# Patient Record
Sex: Female | Born: 1965 | Race: White | Hispanic: No | Marital: Single | Smoking: Never smoker
Health system: Western US, Academic
[De-identification: ages and names within clinical notes are randomized; demographics above are authoritative.]

## PROBLEM LIST (undated history)

## (undated) DIAGNOSIS — M549 Dorsalgia, unspecified: Secondary | ICD-10-CM

## (undated) DIAGNOSIS — I10 Essential (primary) hypertension: Secondary | ICD-10-CM

## (undated) DIAGNOSIS — E785 Hyperlipidemia, unspecified: Secondary | ICD-10-CM

## (undated) DIAGNOSIS — E119 Type 2 diabetes mellitus without complications: Secondary | ICD-10-CM

## (undated) DIAGNOSIS — K5190 Ulcerative colitis, unspecified, without complications: Secondary | ICD-10-CM

## (undated) HISTORY — DX: Dorsalgia, unspecified: M54.9

## (undated) HISTORY — DX: Essential (primary) hypertension: I10

## (undated) HISTORY — DX: Type 2 diabetes mellitus without complications (CMS-HCC): E11.9

## (undated) HISTORY — DX: Hyperlipidemia, unspecified: E78.5

## (undated) MED ORDER — OXYCODONE-ACETAMINOPHEN 5-325 MG OR TABS
1.00 | ORAL_TABLET | Freq: Three times a day (TID) | ORAL | Status: AC | PRN
Start: 2011-06-03 — End: ?

## (undated) MED ORDER — MORPHINE SULFATE 20 MG OR CP24
20.00 mg | ORAL_CAPSULE | Freq: Every day | ORAL | Status: AC
Start: 2012-10-10 — End: ?

## (undated) MED ORDER — HYDROCODONE-ACETAMINOPHEN 7.5-325 MG OR TABS
1.00 | ORAL_TABLET | Freq: Four times a day (QID) | ORAL | Status: AC | PRN
Start: 2011-05-21 — End: ?

## (undated) MED ORDER — HYDROCODONE-ACETAMINOPHEN 7.5-325 MG OR TABS
1.00 | ORAL_TABLET | Freq: Three times a day (TID) | ORAL | Status: AC | PRN
Start: 2012-02-11 — End: 2012-03-15

## (undated) MED ORDER — TRAMADOL HCL 50 MG OR TABS
50.00 mg | ORAL_TABLET | Freq: Four times a day (QID) | ORAL | Status: AC | PRN
Start: 2009-10-17 — End: ?

## (undated) MED ORDER — LANSOPRAZOLE 30 MG OR CPDR
30.00 mg | DELAYED_RELEASE_CAPSULE | Freq: Every day | ORAL | Status: AC
Start: 2011-04-12 — End: ?

## (undated) MED ORDER — HEPARIN SODIUM (PORCINE) 10000 UNIT/ML IJ SOLN
5000.00 [IU] | Freq: Three times a day (TID) | INTRAMUSCULAR | Status: AC
Start: 2011-04-08 — End: ?

## (undated) MED ORDER — HYDROCODONE-ACETAMINOPHEN 7.5-325 MG OR TABS
1.00 | ORAL_TABLET | Freq: Three times a day (TID) | ORAL | Status: AC | PRN
Start: 2012-04-14 — End: 2012-07-13

## (undated) MED ORDER — TRAMADOL HCL 50 MG OR TABS
50.00 mg | ORAL_TABLET | ORAL | Status: AC
Start: 2012-02-11 — End: ?

## (undated) MED ORDER — ONDANSETRON HCL 4 MG OR TABS
4.00 mg | ORAL_TABLET | Freq: Three times a day (TID) | ORAL | Status: AC | PRN
Start: 2012-01-07 — End: ?

## (undated) MED ORDER — LORAZEPAM 1 MG OR TABS
1.00 mg | ORAL_TABLET | Freq: Three times a day (TID) | ORAL | Status: AC | PRN
Start: 2012-04-15 — End: ?

## (undated) MED ORDER — LORAZEPAM 1 MG OR TABS
1.00 mg | ORAL_TABLET | Freq: Three times a day (TID) | ORAL | Status: AC | PRN
Start: 2012-04-22 — End: ?

## (undated) MED ORDER — LORAZEPAM 1 MG OR TABS
1.00 mg | ORAL_TABLET | Freq: Three times a day (TID) | ORAL | Status: AC | PRN
Start: 2012-05-25 — End: ?

## (undated) MED ORDER — DIAZEPAM 5 MG OR TABS
5.00 mg | ORAL_TABLET | Freq: Three times a day (TID) | ORAL | Status: AC | PRN
Start: 2009-11-21 — End: ?

## (undated) MED ORDER — GLIPIZIDE 5 MG OR TB24
5.00 mg | ORAL_TABLET | Freq: Every day | ORAL | Status: AC
Start: 2011-10-10 — End: ?

## (undated) MED ORDER — PIOGLITAZONE HCL 15 MG OR TABS
15.00 mg | ORAL_TABLET | Freq: Every day | ORAL | Status: AC
Start: 2010-06-26 — End: ?

## (undated) MED ORDER — HYDROCODONE-ACETAMINOPHEN 7.5-325 MG OR TABS
1.00 | ORAL_TABLET | Freq: Three times a day (TID) | ORAL | Status: AC | PRN
Start: 2012-03-15 — End: 2012-04-17

## (undated) MED ORDER — LORAZEPAM 1 MG OR TABS
1.00 mg | ORAL_TABLET | Freq: Three times a day (TID) | ORAL | Status: AC | PRN
Start: 2011-12-29 — End: ?

## (undated) MED ORDER — TRAMADOL HCL 50 MG OR TABS
50.00 mg | ORAL_TABLET | Freq: Four times a day (QID) | ORAL | Status: AC | PRN
Start: 2009-10-11 — End: ?

## (undated) MED ORDER — HYDROCODONE-ACETAMINOPHEN 7.5-325 MG OR TABS
1.00 | ORAL_TABLET | Freq: Four times a day (QID) | ORAL | Status: AC | PRN
Start: 2011-05-19 — End: ?

## (undated) MED ORDER — TRAMADOL HCL 50 MG OR TABS
50.00 mg | ORAL_TABLET | ORAL | Status: AC
Start: 2012-04-22 — End: 2012-05-12

## (undated) MED ORDER — PROCHLORPERAZINE MALEATE 10 MG OR TABS
10.00 mg | ORAL_TABLET | Freq: Four times a day (QID) | ORAL | 0 refills | Status: AC | PRN
Start: 2015-01-22 — End: ?

## (undated) MED ORDER — LORAZEPAM 1 MG OR TABS
1.00 mg | ORAL_TABLET | Freq: Three times a day (TID) | ORAL | Status: AC | PRN
Start: 2012-01-14 — End: ?

## (undated) MED ORDER — CARISOPRODOL 350 MG OR TABS
350.00 mg | ORAL_TABLET | ORAL | Status: AC
Start: 2010-08-19 — End: ?

## (undated) MED ORDER — PROMETHAZINE HCL 12.5 MG OR TABS
12.50 mg | ORAL_TABLET | Freq: Two times a day (BID) | ORAL | Status: AC | PRN
Start: 2010-12-26 — End: ?

## (undated) MED ORDER — TRAMADOL HCL 50 MG OR TABS
100.00 mg | ORAL_TABLET | Freq: Three times a day (TID) | ORAL | Status: AC | PRN
Start: 2011-06-12 — End: 2011-07-12

## (undated) MED ORDER — ENOXAPARIN SODIUM 40 MG/0.4ML SC SOLN
40.00 mg | Freq: Every day | SUBCUTANEOUS | Status: AC
Start: 2015-01-03 — End: ?

## (undated) MED ORDER — TRAMADOL HCL 50 MG OR TABS
50.00 mg | ORAL_TABLET | Freq: Three times a day (TID) | ORAL | Status: AC | PRN
Start: 2010-12-26 — End: 2011-01-25

## (undated) MED ORDER — HYDROCODONE-ACETAMINOPHEN 7.5-325 MG OR TABS
1.00 | ORAL_TABLET | Freq: Three times a day (TID) | ORAL | Status: AC | PRN
Start: 2012-05-13 — End: 2012-08-11

## (undated) MED ORDER — HYDROCODONE-ACETAMINOPHEN 7.5-325 MG OR TABS
1.00 | ORAL_TABLET | Freq: Three times a day (TID) | ORAL | Status: AC | PRN
Start: 2012-03-11 — End: 2012-04-13

## (undated) MED ORDER — HYDROMORPHONE HCL 4 MG OR TABS
4.00 mg | ORAL_TABLET | ORAL | 0 refills | Status: AC | PRN
Start: 2015-01-22 — End: ?

## (undated) MED ORDER — LORAZEPAM 1 MG OR TABS
1.00 mg | ORAL_TABLET | Freq: Three times a day (TID) | ORAL | Status: AC | PRN
Start: 2012-03-11 — End: ?

## (undated) MED ORDER — CARISOPRODOL 350 MG OR TABS
350.00 mg | ORAL_TABLET | Freq: Three times a day (TID) | ORAL | Status: AC | PRN
Start: 2009-09-14 — End: ?

## (undated) MED ORDER — TRAMADOL HCL 50 MG OR TABS
50.00 mg | ORAL_TABLET | ORAL | Status: AC
Start: 2012-04-23 — End: 2012-05-13

## (undated) MED ORDER — TRAMADOL HCL 50 MG OR TABS
50.00 mg | ORAL_TABLET | ORAL | Status: AC
Start: 2012-01-07 — End: ?

## (undated) MED ORDER — HYDROCODONE-ACETAMINOPHEN 7.5-750 MG OR TABS
1.00 | ORAL_TABLET | Freq: Four times a day (QID) | ORAL | Status: AC | PRN
Start: 2011-05-12 — End: ?

---

## 1992-08-11 HISTORY — PX: PB ANESTH,TUBAL LIGATION/TRANSECTION: 00851

## 2009-05-18 ENCOUNTER — Inpatient Hospital Stay
Admission: EM | Admit: 2009-05-18 | Discharge: 2009-05-22 | Disposition: A | Discharge status: Home Health | Payer: Self-pay | Attending: Cardiology | Admitting: Cardiology

## 2009-05-18 ENCOUNTER — Ambulatory Visit (HOSPITAL_BASED_OUTPATIENT_CLINIC_OR_DEPARTMENT_OTHER): Admitting: Ophthalmology

## 2009-05-18 ENCOUNTER — Other Ambulatory Visit (HOSPITAL_BASED_OUTPATIENT_CLINIC_OR_DEPARTMENT_OTHER): Payer: Self-pay | Admitting: Emergency Medicine

## 2009-05-18 LAB — APTT, BLOOD: PTT: 31.9 s (ref 25.0–34.0)

## 2009-05-18 LAB — BASIC METABOLIC PANEL, BLOOD
BUN: 13 mg/dL (ref 6–20)
Bicarbonate: 23 mmol/L (ref 22–29)
Calcium: 9.8 mg/dL (ref 8.6–10.5)
Chloride: 100 mmol (ref 98–107)
Creatinine: 0.46 mg/dL — ABNORMAL LOW (ref 0.51–0.95)
GFR (African Amer.): >60 mL/min
GFR: >60 mL/min
Glucose: 146 mg/dL — ABNORMAL HIGH (ref ?–115)
Potassium: 4.6 mmol/L (ref 3.5–5.1)
Sodium: 136 mmol/L (ref 136–145)

## 2009-05-18 LAB — URINALYSIS
Bilirubin: NEGATIVE
Blood: NEGATIVE
Glucose: NEGATIVE
Ketones: NEGATIVE
Leuk Esterase: NEGATIVE
Nitrite: NEGATIVE
Specific Gravity: 1.012 (ref 1.002–1.030)
pH: 6 (ref 5.0–8.0)

## 2009-05-18 LAB — CBC WITH DIFF, BLOOD
Basophils: 0 % (ref 0–2)
Eosinophils: 3 % (ref 1–3)
Hct: 39.2 % (ref 36.0–46.0)
Hgb: 13.1 gm/dL (ref 12.0–16.0)
Lymphocytes: 33 % (ref 20–40)
MCH: 30.4 pg (ref 27–31)
MCHC: 33.3 % (ref 32–37)
MCV: 91.2 um3 (ref 82.0–98.0)
MPV: 8.4 fL (ref 7.4–10.4)
Monocytes: 4 % (ref 1–10)
Plt Count: 243 10*3/uL (ref 130–400)
RBC: 4.3 10*6/uL (ref 4.00–5.00)
RDW: 14 % (ref 10–15)
Segs: 60 % (ref 45–70)
WBC: 7.4 10*3/uL (ref 4.0–11.0)

## 2009-05-18 LAB — UR DRUGS OF ABUSE SCREEN
Amphetamines Screen: NEGATIVE
Barbiturates Screen: NEGATIVE
Benzodiazepine Screen: POSITIVE
Cocaine Screen: NEGATIVE
Methadone Screen: NEGATIVE
Opiates Screen: NEGATIVE
Oxycodone Screen: NEGATIVE
Phencyclidine Screen: NEGATIVE
Propoxyphen: NEGATIVE
THC Screen: NEGATIVE

## 2009-05-18 LAB — CKMB+INDEX (NO CPK), BLOOD
CK-MB Index: INVALID % (ref <2.5)
CK-MB Index: INVALID % (ref <2.5)
CK-MB: 1.1 ng/mL
CK-MB: 1 ng/mL

## 2009-05-18 LAB — RENIN, BLOOD: Renin: INVALID

## 2009-05-18 LAB — LIPASE, BLOOD: Lipase: 40 U/L (ref ?–60)

## 2009-05-18 LAB — GLYCOSYLATED HGB(A1C), BLOOD: Glyco Hgb (A1C): 6.7 % — ABNORMAL HIGH (ref ?–5.9)

## 2009-05-18 LAB — LIVER PANEL, BLOOD
ALT (SGPT): 25 U/L
AST (SGOT): 26 U/L
Albumin: 4.6 gm/dL (ref 3.5–5.2)
Alkaline Phos: 73 U/L
Bilirubin, Dir: 0.1 mg/dL (ref <0.2)
Bilirubin, Tot: 0.3 mg/dL (ref <1.2)
Total Protein: 8 gm/dL (ref ?–8.0)

## 2009-05-18 LAB — CPK-CREATINE PHOSPHOKINASE, BLOOD
CPK: 53 IU/L (ref 0–175)
CPK: 45 [IU]/L (ref 0–175)

## 2009-05-18 LAB — ALDOSTERONE, BLOOD: Aldosterone, Blood: INVALID

## 2009-05-18 LAB — TSH, BLOOD: TSH: 0.72 u[IU]/mL (ref 0.27–4.20)

## 2009-05-18 LAB — TROPONIN T, BLOOD
Troponin T: <0.01 ng/mL (ref <0.03)
Troponin T: <0.01 ng/mL (ref <0.03)

## 2009-05-18 LAB — PROTHROMBIN TIME, BLOOD
INR: 1
PT,Patient: 11.6 s (ref 9.7–12.5)

## 2009-05-18 MED ORDER — CARISOPRODOL 350 MG OR TABS
350.00 mg | ORAL_TABLET | Freq: Three times a day (TID) | ORAL | Status: DC | PRN
Start: ? — End: 2009-09-17

## 2009-05-18 MED ORDER — DIAZEPAM 5 MG OR TABS
5.00 mg | ORAL_TABLET | Freq: Three times a day (TID) | ORAL | Status: DC | PRN
Start: ? — End: 2009-08-23

## 2009-05-18 MED ORDER — DIAZEPAM 5 MG OR TABS
5.0000 mg | ORAL_TABLET | Freq: Three times a day (TID) | ORAL | Status: DC | PRN
Start: 2009-05-18 — End: 2009-05-22

## 2009-05-18 MED ORDER — TRAMADOL HCL 50 MG OR TABS
50.00 mg | ORAL_TABLET | Freq: Four times a day (QID) | ORAL | Status: DC | PRN
Start: ? — End: 2009-08-23

## 2009-05-18 MED ORDER — METFORMIN HCL 500 MG OR TABS
500.00 mg | ORAL_TABLET | Freq: Two times a day (BID) | ORAL | Status: DC
Start: ? — End: 2009-08-23

## 2009-05-18 MED ORDER — TRAMADOL HCL 50 MG OR TABS
50.0000 mg | ORAL_TABLET | Freq: Four times a day (QID) | ORAL | Status: DC | PRN
Start: 2009-05-18 — End: 2009-05-22

## 2009-05-18 MED ORDER — CLONIDINE HCL 0.2 MG OR TABS
0.20 mg | ORAL_TABLET | Freq: Three times a day (TID) | ORAL | Status: DC
Start: ? — End: 2009-05-22

## 2009-05-19 ENCOUNTER — Other Ambulatory Visit (INDEPENDENT_AMBULATORY_CARE_PROVIDER_SITE_OTHER): Payer: Self-pay | Admitting: Medical

## 2009-05-19 LAB — CPK-CREATINE PHOSPHOKINASE, BLOOD: CPK: 36 [IU]/L (ref 0–175)

## 2009-05-19 LAB — MAGNESIUM, BLOOD: Magnesium: 1.6 mg/dL — ABNORMAL LOW (ref ?–2.6)

## 2009-05-19 LAB — HEMOGRAM, BLOOD
Hct: 33.6 % — ABNORMAL LOW (ref 36.0–46.0)
Hgb: 11.2 g/dL — ABNORMAL LOW (ref 12.0–16.0)
MCH: 30.5 pg (ref 27–31)
MCHC: 33.5 % (ref 32–37)
MCV: 91 um3 (ref 82.0–98.0)
MPV: 8.4 fL (ref 7.4–10.4)
Plt Count: 198 10*3/uL (ref 130–400)
RBC: 3.69 10*6/uL — ABNORMAL LOW (ref 4.00–5.00)
RDW: 15 % (ref 10–15)
WBC: 9 10*3/uL (ref 4.0–11.0)

## 2009-05-19 LAB — BASIC METABOLIC PANEL, BLOOD
BUN: 11 mg/dL (ref 6–20)
Bicarbonate: 22 mmol/L (ref 22–29)
Calcium: 8.4 mg/dL — ABNORMAL LOW (ref 8.6–10.5)
Chloride: 102 mmol (ref 98–107)
Creatinine: 0.56 mg/dL (ref 0.51–0.95)
GFR (African Amer.): >60 mL/min
GFR: >60 mL/min
Glucose: 160 mg/dL — ABNORMAL HIGH (ref ?–115)
Potassium: 3.1 mmol/L — ABNORMAL LOW (ref 3.5–5.1)
Sodium: 136 mmol/L (ref 136–145)

## 2009-05-19 LAB — LIPID(CHOL FRACT) PANEL, BLOOD
Cholesterol: 216 mg/dL — ABNORMAL HIGH (ref <200)
HDL-Cholesterol: 38 mg/dL — ABNORMAL LOW
LDL-Chol (Calc): 116 mg/dL (ref <160)
Triglycerides: 310 mg/dL — ABNORMAL HIGH (ref 10–170)

## 2009-05-19 LAB — ECG, COMPLETE (HC/~~LOC~~/ENCINITAS)
ATRIAL RATE: 71 {beats}/min
P AXIS: 25 degrees
PR INTERVAL: 180 ms
QRS INTERVAL/DURATION: 90 ms
QT: 422 ms
QTc (Bazett): 458 ms
R AXIS: 25 degrees
T AXIS: -1 degrees
VENTRICULAR RATE: 71 {beats}/min

## 2009-05-19 LAB — CKMB+INDEX (NO CPK), BLOOD
CK-MB Index: INVALID % (ref <2.5)
CK-MB: 0.9 ng/mL

## 2009-05-19 LAB — TROPONIN T, BLOOD: Troponin T: <0.01 ng/mL (ref <0.03)

## 2009-05-19 LAB — CORTISOL, BLOOD: Cortisol: 5.3 ug/dL

## 2009-05-19 LAB — MRSA SCREEN CULTURE

## 2009-05-20 ENCOUNTER — Other Ambulatory Visit (INDEPENDENT_AMBULATORY_CARE_PROVIDER_SITE_OTHER): Payer: Self-pay | Admitting: Radiation Oncology

## 2009-05-20 LAB — CBC WITH DIFF, BLOOD
Basophils: 1 % (ref 0–2)
Eosinophils: 2 % (ref 1–3)
Hct: 36.4 % (ref 36.0–46.0)
Hgb: 12.3 g/dL (ref 12.0–16.0)
Lymphocytes: 30 % (ref 20–40)
MCH: 30.9 pg (ref 27–31)
MCHC: 33.6 % (ref 32–37)
MCV: 91.7 um3 (ref 82.0–98.0)
MPV: 8.2 fL (ref 7.4–10.4)
Monocytes: 5 % (ref 1–10)
Plt Count: 195 10*3/uL (ref 130–400)
RBC: 3.97 10*6/uL — ABNORMAL LOW (ref 4.00–5.00)
RDW: 13.7 % (ref 10–15)
Segs: 63 % (ref 45–70)
WBC: 9.9 10*3/uL (ref 4.0–11.0)

## 2009-05-20 LAB — PHOSPHORUS, BLOOD: Phosphorous: 4.4 mg/dL (ref ?–4.5)

## 2009-05-20 LAB — BASIC METABOLIC PANEL, BLOOD
BUN: 11 mg/dL (ref 6–20)
Bicarbonate: 22 mmol/L (ref 22–29)
Calcium: 10 mg/dL (ref 8.6–10.5)
Chloride: 102 mmol (ref 98–107)
Creatinine: 0.56 mg/dL (ref 0.51–0.95)
GFR (African Amer.): >60 mL/min
GFR: >60 mL/min
Glucose: 148 mg/dL — ABNORMAL HIGH (ref ?–115)
Potassium: 4.3 mmol/L (ref 3.5–5.1)
Sodium: 136 mmol/L (ref 136–145)

## 2009-05-20 LAB — MAGNESIUM, BLOOD: Magnesium: 2.1 mg/dL (ref ?–2.6)

## 2009-05-20 NOTE — Procedures (Signed)
PERFORMED ON - 05/20/2009 15:30:46;   DONE BY - KAGAN, SHARI;  PROCEDURE - OXYGEN-LOW FLOW;   PROTOCOL DRIVEN: YES-MD INITIATED;   O2 DEVICE: CANNULA-NASAL;   LITERS/MIN: 2;   SATURATION: 96;   O2 LOW-ACTIV OUTCME: CONTINUE PRESENT REGIMEN;   ADVERSE REACTIONS: NONE;   ELECTRONIC SIGNATURE DERIVED FROM A SINGLE CONTROLLED ACCESS PASSWORD:   Julieta Gutting ; 05/20/2009 15:36:40

## 2009-05-21 ENCOUNTER — Other Ambulatory Visit (INDEPENDENT_AMBULATORY_CARE_PROVIDER_SITE_OTHER): Payer: Self-pay | Admitting: Radiation Oncology

## 2009-05-21 LAB — MAGNESIUM, BLOOD
Magnesium: 1.8 mg/dL (ref ?–2.6)
Magnesium: 1.9 mg/dL (ref ?–2.6)

## 2009-05-21 LAB — ECG, COMPLETE (HC/~~LOC~~/ENCINITAS)
ATRIAL RATE: 72 {beats}/min
ATRIAL RATE: 61 {beats}/min
ATRIAL RATE: 66 {beats}/min
ECG INTERPRETATION: NORMAL
ECG INTERPRETATION: NORMAL
ECG INTERPRETATION: NORMAL
P AXIS: 17 degrees
P AXIS: 33 degrees
P AXIS: 20 degrees
PR INTERVAL: 204 ms
PR INTERVAL: 190 ms
PR INTERVAL: 204 ms
QRS INTERVAL/DURATION: 92 ms
QRS INTERVAL/DURATION: 88 ms
QRS INTERVAL/DURATION: 86 ms
QT: 450 ms
QT: 452 ms
QT: 432 ms
QTc (Bazett): 492 ms
QTc (Bazett): 455 ms
QTc (Bazett): 453 ms
R AXIS: 42 degrees
R AXIS: 31 degrees
R AXIS: 34 degrees
T AXIS: 11 degrees
T AXIS: -12 degrees
T AXIS: 1 degrees
VENTRICULAR RATE: 72 {beats}/min
VENTRICULAR RATE: 61 {beats}/min
VENTRICULAR RATE: 66 {beats}/min

## 2009-05-21 LAB — BASIC METABOLIC PANEL, BLOOD
BUN: 12 mg/dL (ref 6–20)
BUN: 13 mg/dL (ref 6–20)
Bicarbonate: 24 mmol/L (ref 22–29)
Bicarbonate: 22 mmol/L (ref 22–29)
Calcium: 9.9 mg/dL (ref 8.6–10.5)
Calcium: 10.2 mg/dL (ref 8.6–10.5)
Chloride: 98 mmol (ref 98–107)
Chloride: 100 mmol (ref 98–107)
Creatinine: 0.69 mg/dL (ref 0.51–0.95)
Creatinine: 0.59 mg/dL (ref 0.51–0.95)
GFR (African Amer.): >60 mL/min
GFR (African Amer.): >60 mL/min
GFR: >60 mL/min
GFR: >60 mL/min
Glucose: 123 mg/dL — ABNORMAL HIGH (ref ?–115)
Glucose: 142 mg/dL — ABNORMAL HIGH (ref ?–115)
Potassium: 4.1 mmol/L (ref 3.5–5.1)
Potassium: 4.3 mmol/L (ref 3.5–5.1)
Sodium: 134 mmol/L — ABNORMAL LOW (ref 136–145)
Sodium: 135 mmol/L — ABNORMAL LOW (ref 136–145)

## 2009-05-21 LAB — CKMB+INDEX (NO CPK), BLOOD
CK-MB Index: INVALID % (ref <2.5)
CK-MB: 1.1 ng/mL

## 2009-05-21 LAB — CBC WITH DIFF, BLOOD
Basophils: 0 % (ref 0–2)
Eosinophils: 3 % (ref 1–3)
Hct: 36.8 % (ref 36.0–46.0)
Hgb: 12.5 g/dL (ref 12.0–16.0)
Lymphocytes: 31 % (ref 20–40)
MCH: 31.2 pg — ABNORMAL HIGH (ref 27–31)
MCHC: 34 % (ref 32–37)
MCV: 91.9 um3 (ref 82.0–98.0)
MPV: 8.7 fL (ref 7.4–10.4)
Monocytes: 5 % (ref 1–10)
Plt Count: 219 10*3/uL (ref 130–400)
RBC: 4 10*6/uL (ref 4.00–5.00)
RDW: 13.7 % (ref 10–15)
Segs: 62 % (ref 45–70)
WBC: 10.6 10*3/uL (ref 4.0–11.0)

## 2009-05-21 LAB — RENIN, BLOOD: Renin: 0.9 ngmLhr

## 2009-05-21 LAB — CPK-CREATINE PHOSPHOKINASE, BLOOD
CPK: 34 [IU]/L (ref 0–175)
CPK: 30 [IU]/L (ref 0–175)

## 2009-05-21 LAB — PHOSPHORUS, BLOOD
Phosphorous: 4 mg/dL (ref ?–4.5)
Phosphorous: 4.8 mg/dL — ABNORMAL HIGH (ref ?–4.5)

## 2009-05-21 LAB — TROPONIN T, BLOOD
Troponin T: <0.01 ng/mL (ref <0.03)
Troponin T: <0.01 ng/mL (ref <0.03)

## 2009-05-22 ENCOUNTER — Other Ambulatory Visit (INDEPENDENT_AMBULATORY_CARE_PROVIDER_SITE_OTHER): Payer: Self-pay | Admitting: Medical

## 2009-05-22 LAB — ALDOSTERONE, BLOOD: Aldosterone, Blood: 2.1 ng/dL

## 2009-05-22 LAB — BASIC METABOLIC PANEL, BLOOD
BUN: 16 mg/dL (ref 6–20)
Bicarbonate: 23 mmol/L (ref 22–29)
Calcium: 10.2 mg/dL (ref 8.6–10.5)
Chloride: 98 mmol (ref 98–107)
Creatinine: 0.57 mg/dL (ref 0.51–0.95)
GFR (African Amer.): >60 mL/min
GFR: >60 mL/min
Glucose: 178 mg/dL — ABNORMAL HIGH (ref ?–115)
Potassium: 4.5 mmol/L (ref 3.5–5.1)
Sodium: 134 mmol/L — ABNORMAL LOW (ref 136–145)

## 2009-05-22 LAB — PB ECHOCARDIOGRAM, 2-D
LV Ejection Fraction: 69 % (ref >50)
PA Pressure: 19 mm[Hg] (ref 20–30)

## 2009-05-22 LAB — MAGNESIUM, BLOOD: Magnesium: 1.9 mg/dL (ref ?–2.6)

## 2009-05-22 MED ORDER — LISINOPRIL 40 MG OR TABS
40.0000 mg | ORAL_TABLET | Freq: Every day | ORAL | Status: DC
Start: 2009-05-22 — End: 2009-12-03

## 2009-05-22 MED ORDER — SPIRONOLACTONE 25 MG OR TABS
25.0000 mg | ORAL_TABLET | Freq: Every day | ORAL | Status: DC
Start: 2009-05-22 — End: 2009-10-31

## 2009-05-22 MED ORDER — HYDROCODONE-ACETAMINOPHEN 5-500 MG OR TABS
1.0000 | ORAL_TABLET | Freq: Four times a day (QID) | ORAL | Status: DC | PRN
Start: 2009-05-22 — End: 2009-08-23

## 2009-05-22 MED ORDER — SIMVASTATIN 10 MG OR TABS
10.0000 mg | ORAL_TABLET | Freq: Every evening | ORAL | Status: DC
Start: 2009-05-22 — End: 2009-12-03

## 2009-05-22 MED ORDER — CARVEDILOL 25 MG OR TABS
50.0000 mg | ORAL_TABLET | Freq: Two times a day (BID) | ORAL | Status: DC
Start: 2009-05-22 — End: 2009-10-24

## 2009-05-22 MED ORDER — AMLODIPINE 10 MG OR TABS
10.0000 mg | ORAL_TABLET | Freq: Every day | ORAL | Status: DC
Start: 2009-05-22 — End: 2009-07-09

## 2009-05-23 LAB — CONF BENZODIAZEPINE-URINE

## 2009-05-23 LAB — 24 HOUR URINE CATECHOLAMINES
Dopamine, Urine - ug/d: 141 ug/d (ref 60–440)
Epinephrine ug/d: <3 ug/d (ref 0–25)
Hours Collected: 24 h
Norepinephrine ug/d: 90 ug/d (ref 0–100)
Total Volume: 2143 mL

## 2009-05-25 ENCOUNTER — Telehealth (HOSPITAL_BASED_OUTPATIENT_CLINIC_OR_DEPARTMENT_OTHER): Payer: Self-pay | Admitting: Interventional Cardiology

## 2009-05-25 LAB — 24 HOUR URINE METANEPHRINE
Hours Collected: 24 h
Metanephrines ug/d: 384 nmol/d (ref 152–1775)
Normetanephrine ug/d: 2657 nmol/d (ref 273–3548)
Total Volume: 2143 mL

## 2009-05-30 ENCOUNTER — Encounter (HOSPITAL_BASED_OUTPATIENT_CLINIC_OR_DEPARTMENT_OTHER): Admitting: Ophthalmology

## 2009-06-04 ENCOUNTER — Encounter (HOSPITAL_BASED_OUTPATIENT_CLINIC_OR_DEPARTMENT_OTHER): Payer: Self-pay | Admitting: Ophthalmology

## 2009-06-05 ENCOUNTER — Telehealth (HOSPITAL_BASED_OUTPATIENT_CLINIC_OR_DEPARTMENT_OTHER): Payer: Self-pay

## 2009-06-06 ENCOUNTER — Encounter (HOSPITAL_BASED_OUTPATIENT_CLINIC_OR_DEPARTMENT_OTHER): Admitting: Interventional Cardiology

## 2009-06-14 ENCOUNTER — Telehealth (HOSPITAL_BASED_OUTPATIENT_CLINIC_OR_DEPARTMENT_OTHER): Payer: Self-pay | Admitting: Cardiology

## 2009-06-19 ENCOUNTER — Telehealth (HOSPITAL_BASED_OUTPATIENT_CLINIC_OR_DEPARTMENT_OTHER): Payer: Self-pay

## 2009-06-20 ENCOUNTER — Ambulatory Visit (HOSPITAL_BASED_OUTPATIENT_CLINIC_OR_DEPARTMENT_OTHER): Admitting: Cardiovascular Disease

## 2009-06-20 VITALS — BP 145/92 | HR 79 | Temp 99.5°F | Resp 18 | Ht 64.0 in | Wt 174.0 lb

## 2009-06-20 MED ORDER — HYDRALAZINE HCL 25 MG OR TABS
25.0000 mg | ORAL_TABLET | Freq: Three times a day (TID) | ORAL | Status: DC
Start: 2009-06-20 — End: 2009-06-29

## 2009-06-26 ENCOUNTER — Telehealth (HOSPITAL_BASED_OUTPATIENT_CLINIC_OR_DEPARTMENT_OTHER): Payer: Self-pay | Admitting: Cardiovascular Disease

## 2009-06-29 ENCOUNTER — Telehealth (HOSPITAL_BASED_OUTPATIENT_CLINIC_OR_DEPARTMENT_OTHER): Payer: Self-pay | Admitting: Cardiovascular Disease

## 2009-06-29 ENCOUNTER — Ambulatory Visit (HOSPITAL_BASED_OUTPATIENT_CLINIC_OR_DEPARTMENT_OTHER): Admitting: Cardiovascular Disease

## 2009-06-29 VITALS — BP 153/101 | HR 86 | Temp 99.0°F | Ht 64.0 in | Wt 175.0 lb

## 2009-06-29 MED ORDER — NIFEDIPINE 90 MG OR TB24
90.0000 mg | ORAL_TABLET | Freq: Every day | ORAL | Status: DC
Start: 2009-06-29 — End: 2009-08-23

## 2009-07-09 ENCOUNTER — Telehealth (HOSPITAL_BASED_OUTPATIENT_CLINIC_OR_DEPARTMENT_OTHER): Payer: Self-pay | Admitting: Cardiovascular Disease

## 2009-07-09 NOTE — Telephone Encounter (Signed)
Per office visit note of 05/29/09, patient is to     1)d/c hydralizine because she reported unpleasant side effects.      2) Per Dr. Joycelyn Man note, she is to stay on Norvasc/amlodipine 10mg  per day (because she cannot afford the preferred nifedipine) and was to check in with [facility], although no appointment  b/c pt stated she is going away for three months and pt was advise by Dr. Charise Killian to present to ED in the event of HTN symtoms.    Dr. Charise Killian does mention in note that he considers restarting clonidine, although I do not see a dosage.  Dr. Charise Killian, please confirm if you would like pt also on clonidine and kindly reorder or note dosage.    Lastly, pt is to take 2 tabs of carvedilol 2 times daily.    I will call patient and advise regarding medications as above and await Dr. Joycelyn Man clarification.

## 2009-07-09 NOTE — Telephone Encounter (Signed)
Pt sates that she is unable to afford NIFEdipine and is requesting that an alternative be prescribed. Pt would also like to verify the dosage instructions for carvedilol and to clarify that she is to discontinue taking hydralazine. Please call back and advise

## 2009-07-13 MED ORDER — CLONIDINE HCL 0.3 MG OR TABS
0.3000 mg | ORAL_TABLET | Freq: Two times a day (BID) | ORAL | Status: DC
Start: 2009-07-13 — End: 2009-10-24

## 2009-07-13 MED ORDER — AMLODIPINE 10 MG OR TABS
10.0000 mg | ORAL_TABLET | Freq: Every day | ORAL | Status: DC
Start: 2009-07-09 — End: 2009-12-03

## 2009-08-23 ENCOUNTER — Ambulatory Visit (INDEPENDENT_AMBULATORY_CARE_PROVIDER_SITE_OTHER): Admitting: Internal Medicine

## 2009-08-23 ENCOUNTER — Other Ambulatory Visit (INDEPENDENT_AMBULATORY_CARE_PROVIDER_SITE_OTHER): Payer: Self-pay | Admitting: Internal Medicine

## 2009-08-23 ENCOUNTER — Encounter (INDEPENDENT_AMBULATORY_CARE_PROVIDER_SITE_OTHER): Payer: Self-pay | Admitting: Internal Medicine

## 2009-08-23 VITALS — BP 163/109 | HR 75 | Temp 98.7°F | Resp 16 | Ht 64.0 in | Wt 173.0 lb

## 2009-08-23 MED ORDER — DIAZEPAM 5 MG OR TABS
5.0000 mg | ORAL_TABLET | Freq: Three times a day (TID) | ORAL | Status: DC | PRN
Start: 2009-08-23 — End: 2009-09-27

## 2009-08-30 ENCOUNTER — Ambulatory Visit (HOSPITAL_BASED_OUTPATIENT_CLINIC_OR_DEPARTMENT_OTHER): Admitting: Ophthalmology

## 2009-08-30 MED ORDER — TRAMADOL HCL 50 MG OR TABS
50.0000 mg | ORAL_TABLET | Freq: Four times a day (QID) | ORAL | Status: DC | PRN
Start: 2009-08-23 — End: 2009-10-30

## 2009-08-30 NOTE — Telephone Encounter (Signed)
Received refill request   Med:   tramadol (ULTRAM) 50 MG tablet  Last visit:   08/23/09  New visit:   10/10/09  No shows:   none  ER visits:    none  Routed to:    Dr. Conley Rolls for approval  Lab Results   Component Value Date    AST 26 05/18/2009    ALT 25 05/18/2009    ALK 73 05/18/2009    TP 8.0 05/18/2009    ALB 4.6 05/18/2009    TBILI 0.3 05/18/2009    DBILI 0.1 05/18/2009       Lab Results   Component Value Date    BUN 16 05/22/2009    CREAT 0.57 05/22/2009    CL 98 05/22/2009    NA 134 05/22/2009    K 4.5 05/22/2009    [place] 10.2 05/22/2009    TBILI 0.3 05/18/2009    ALB 4.6 05/18/2009    TP 8.0 05/18/2009    AST 26 05/18/2009    ALK 73 05/18/2009    BICARB 23 05/22/2009    ALT 25 05/18/2009    GLU 178 05/22/2009

## 2009-09-02 ENCOUNTER — Inpatient Hospital Stay
Admission: AD | Admit: 2009-09-02 | Discharge: 2009-09-08 | Disposition: A | Discharge status: Home Health | Payer: Self-pay | Attending: Critical Care Medicine | Admitting: Critical Care Medicine

## 2009-09-02 DIAGNOSIS — R109 Unspecified abdominal pain: Principal | ICD-10-CM | POA: Insufficient documentation

## 2009-09-02 DIAGNOSIS — K55039 Acute (reversible) ischemia of large intestine, extent unspecified: Secondary | ICD-10-CM

## 2009-09-02 LAB — URINALYSIS
Bilirubin: NEGATIVE
Blood: NEGATIVE
Glucose: NEGATIVE
Ketones: NEGATIVE
Leuk Esterase: NEGATIVE
Nitrite: NEGATIVE
Protein: NEGATIVE
Specific Gravity: 1.01 (ref 1.002–1.030)
pH: 5.5 (ref 5.0–8.0)

## 2009-09-02 LAB — CBC WITH DIFF, BLOOD
Abs Eosinophils: 0.1 10*3/uL (ref 0.0–0.5)
Abs Lymphs: 1.2 10*3/uL (ref 0.8–3.1)
Abs Monos: 0.6 10*3/uL (ref 0.2–0.8)
Absolute Neutrophil Count: 14.6 10*3/uL — ABNORMAL HIGH (ref 1.6–7.0)
Eosinophils: 1 % (ref 1–7)
Hct: 38.6 % (ref 34.0–45.0)
Hgb: 13.1 g/dL (ref 11.2–15.7)
Lymphocytes: 7 % — ABNORMAL LOW (ref 19–53)
MCH: 29.3 pg (ref 26.0–32.0)
MCHC: 33.9 % (ref 32.0–36.0)
MCV: 86.4 um3 (ref 79.0–95.0)
MPV: 9.6 fL (ref 9.4–12.4)
Monocytes: 4 % — ABNORMAL LOW (ref 5–12)
Plt Count: 228 10*3/uL (ref 160–370)
RBC: 4.47 10*6/uL (ref 3.90–5.20)
RDW: 12.7 % (ref 12.0–14.0)
Segs: 88 % — ABNORMAL HIGH (ref 34–71)
WBC: 16.6 10*3/uL — ABNORMAL HIGH (ref 4.0–10.0)

## 2009-09-02 LAB — COMPREHENSIVE METABOLIC PANEL, BLOOD
ALT (SGPT): 21 U/L (ref 0–33)
AST (SGOT): 19 U/L (ref 0–32)
Albumin: 4.9 g/dL (ref 3.5–5.2)
Alkaline Phos: 82 U/L (ref 35–140)
BUN: 6 mg/dL (ref 6–20)
Bicarbonate: 22 mmol/L (ref 22–29)
Bilirubin, Tot: 0.6 mg/dL (ref <1.2)
Calcium: 9.4 mg/dL (ref 8.6–10.5)
Chloride: 100 mmol/L (ref 98–107)
Creatinine: 0.49 mg/dL — ABNORMAL LOW (ref 0.51–0.95)
Glucose: 148 mg/dL — ABNORMAL HIGH (ref 70–115)
Potassium: 4.1 mmol/L (ref 3.5–5.1)
Sodium: 137 mmol/L (ref 136–145)
Total Protein: 7.9 g/dL (ref 6.0–8.0)

## 2009-09-02 LAB — CPK-CREATINE PHOSPHOKINASE, BLOOD: CPK: 44 U/L (ref 0–175)

## 2009-09-02 LAB — CKMB+INDEX (NO CPK), BLOOD: CK-MB: 1.3 ng/mL (ref 0.0–2.8)

## 2009-09-02 LAB — GFR: GFR: >60 mL/min

## 2009-09-02 LAB — APTT, BLOOD: PTT: 31.4 s (ref 25.0–34.0)

## 2009-09-02 LAB — TYPE & SCREEN: Antibody Screen: NEGATIVE

## 2009-09-02 LAB — PROTHROMBIN TIME, BLOOD
INR: 1.1
PT,Patient: 12.4 s (ref 9.7–12.5)

## 2009-09-02 LAB — LACTATE, BLOOD: Lactate: 15.9 mg/dL (ref 4.5–19.8)

## 2009-09-02 LAB — HEMOLYSIS

## 2009-09-02 LAB — BILIRUBIN, DIR BLOOD: Bilirubin, Dir: 0.1 mg/dL (ref <0.2)

## 2009-09-02 LAB — TROPONIN T, BLOOD: Troponin T: <0.01 ng/mL (ref <0.01)

## 2009-09-02 LAB — LIPASE, BLOOD: Lipase: 22 U/L (ref 13–60)

## 2009-09-03 LAB — CBC WITH DIFF, BLOOD
Abs Eosinophils: 0.1 10*3/uL (ref 0.0–0.5)
Abs Eosinophils: 0.1 10*3/uL (ref 0.0–0.5)
Abs Lymphs: 2 10*3/uL (ref 0.8–3.1)
Abs Lymphs: 2.1 10*3/uL (ref 0.8–3.1)
Abs Monos: 0.7 10*3/uL (ref 0.2–0.8)
Abs Monos: 0.9 10*3/uL — ABNORMAL HIGH (ref 0.2–0.8)
Absolute Neutrophil Count: 10.2 10*3/uL — ABNORMAL HIGH (ref 1.6–7.0)
Absolute Neutrophil Count: 13.7 10*3/uL — ABNORMAL HIGH (ref 1.6–7.0)
Eosinophils: 1 % (ref 1–7)
Eosinophils: 1 % (ref 1–7)
Hct: 33.7 % — ABNORMAL LOW (ref 34.0–45.0)
Hct: 34.8 % (ref 34.0–45.0)
Hgb: 11 gm/dL — ABNORMAL LOW (ref 11.2–15.7)
Hgb: 11.4 g/dL (ref 11.2–15.7)
Imm Gran Abs: 0.1 10*3/uL (ref 0.0–0.1)
Lymphocytes: 15 % — ABNORMAL LOW (ref 19–53)
Lymphocytes: 12 % — ABNORMAL LOW (ref 19–53)
MCH: 28.4 pg (ref 26.0–32.0)
MCH: 28.4 pg (ref 26.0–32.0)
MCHC: 32.6 % (ref 32.0–36.0)
MCHC: 32.8 % (ref 32.0–36.0)
MCV: 87.1 um3 (ref 79.0–95.0)
MCV: 86.6 um3 (ref 79.0–95.0)
MPV: 9.4 fL (ref 9.4–12.4)
MPV: 9.7 fL (ref 9.4–12.4)
Monocytes: 6 % (ref 5–12)
Monocytes: 6 % (ref 5–12)
Plt Count: 189 10*3/uL (ref 160–370)
Plt Count: 224 10*3/uL (ref 160–370)
RBC: 3.87 10*6/uL — ABNORMAL LOW (ref 3.90–5.20)
RBC: 4.02 10*6/uL (ref 3.90–5.20)
RDW: 12.8 % (ref 12.0–14.0)
RDW: 12.8 % (ref 12.0–14.0)
Segs: 78 % — ABNORMAL HIGH (ref 34–71)
Segs: 81 % — ABNORMAL HIGH (ref 34–71)
WBC: 13.1 10*3/uL — ABNORMAL HIGH (ref 4.0–10.0)
WBC: 16.9 10*3/uL — ABNORMAL HIGH (ref 4.0–10.0)

## 2009-09-03 LAB — LACTATE, BLOOD
Lactate: 7.2 mg/dL (ref 4.5–19.8)
Lactate: 5.3 mg/dL (ref 4.5–19.8)

## 2009-09-03 LAB — BASIC METABOLIC PANEL, BLOOD
BUN: 4 mg/dL — ABNORMAL LOW (ref 6–20)
Bicarbonate: 24 mmol/L (ref 22–29)
Calcium: 8.4 mg/dL — ABNORMAL LOW (ref 8.6–10.5)
Chloride: 99 mmol/L (ref 98–107)
Creatinine: 0.54 mg/dL (ref 0.51–0.95)
Glucose: 149 mg/dL — ABNORMAL HIGH (ref 70–115)
Potassium: 2.9 mmol/L — ABNORMAL LOW (ref 3.5–5.1)
Sodium: 137 mmol/L (ref 136–145)

## 2009-09-03 LAB — CKMB+INDEX (NO CPK), BLOOD
CK-MB: 1.3 ng/mL (ref 0.0–2.8)
CK-MB: 1.3 ng/mL (ref 0.0–2.8)
CK-MB: 1.1 ng/mL (ref 0.0–2.8)

## 2009-09-03 LAB — CPK-CREATINE PHOSPHOKINASE, BLOOD
CPK: 40 U/L (ref 0–175)
CPK: 37 U/L (ref 0–175)
CPK: 39 U/L (ref 0–175)

## 2009-09-03 LAB — PHOSPHORUS, BLOOD: Phosphorous: 2.5 mg/dL — ABNORMAL LOW (ref 2.7–4.5)

## 2009-09-03 LAB — LIVER PANEL, BLOOD
ALT (SGPT): 14 U/L (ref 0–33)
AST (SGOT): 12 U/L (ref 0–32)
Albumin: 4.2 g/dL (ref 3.5–5.2)
Alkaline Phos: 64 U/L (ref 35–140)
Bilirubin, Dir: 0.3 mg/dL — ABNORMAL HIGH (ref <0.2)
Bilirubin, Tot: 1 mg/dL (ref <1.2)
Total Protein: 6.5 g/dL (ref 6.0–8.0)

## 2009-09-03 LAB — TROPONIN T, BLOOD
Troponin T: <0.01 ng/mL (ref <0.01)
Troponin T: <0.01 ng/mL (ref <0.01)
Troponin T: <0.01 ng/mL (ref <0.01)

## 2009-09-03 LAB — APTT, BLOOD: PTT: 29.8 s (ref 25.0–34.0)

## 2009-09-03 LAB — PROTHROMBIN TIME, BLOOD
INR: 1.2
PT,Patient: 13.5 s — ABNORMAL HIGH (ref 9.7–12.5)

## 2009-09-03 LAB — STOOL GUAIAC OCCULT BLOOD: Occult Blood, Guaiac: NEGATIVE

## 2009-09-03 LAB — MAGNESIUM, BLOOD: Magnesium: 1.7 mg/dL (ref 1.7–2.6)

## 2009-09-03 LAB — POTASSIUM, BLOOD
Potassium: 3.5 mmol/L (ref 3.5–5.1)
Potassium: 3.1 mmol/L — ABNORMAL LOW (ref 3.5–5.1)

## 2009-09-03 LAB — SMEAR, WBC: WBC Smear: NONE SEEN

## 2009-09-03 LAB — GFR: GFR: >60 mL/min

## 2009-09-04 LAB — LACTATE, BLOOD: Lactate: 10.3 mg/dL (ref 4.5–19.8)

## 2009-09-04 LAB — CBC WITH DIFF, BLOOD
Abs Basophils: 0.2 10*3/uL — ABNORMAL HIGH (ref 0.0–0.1)
Abs Eosinophils: 0.2 10*3/uL (ref 0.0–0.5)
Abs Lymphs: 1.4 10*3/uL (ref 0.8–3.1)
Abs Lymphs: 2.4 10*3/uL (ref 0.8–3.1)
Abs Monos: 0.3 10*3/uL (ref 0.2–0.8)
Abs Monos: 0.7 10*3/uL (ref 0.2–0.8)
AbsoLute Lymphocyte Count: 1.4 10*3/uL
Absolute Neutrophil Count: 15 10*3/uL — ABNORMAL HIGH (ref 1.6–7.0)
Absolute Neutrophil Count: 14 10*3/uL — ABNORMAL HIGH (ref 1.6–7.0)
Bands: 8 % (ref 0–15)
Basophils: 1 % (ref 0–2)
Eosinophils: 1 % (ref 1–7)
Hct: 36.1 % (ref 34.0–45.0)
Hct: 36.6 % (ref 34.0–45.0)
Hgb: 11.8 gm/dL (ref 11.2–15.7)
Hgb: 12 g/dL (ref 11.2–15.7)
Imm Gran Abs: 0.1 10*3/uL (ref 0.0–0.1)
Lymphocytes: 8 % — ABNORMAL LOW (ref 19–53)
Lymphocytes: 14 % — ABNORMAL LOW (ref 19–53)
MCH: 28.6 pg (ref 26.0–32.0)
MCH: 28.8 pg (ref 26.0–32.0)
MCHC: 32.7 % (ref 32.0–36.0)
MCHC: 32.8 % (ref 32.0–36.0)
MCV: 87.6 um3 (ref 79.0–95.0)
MCV: 88 um3 (ref 79.0–95.0)
MPV: 9.5 fL (ref 9.4–12.4)
MPV: 9.8 fL (ref 9.4–12.4)
Monocytes: 2 % — ABNORMAL LOW (ref 5–12)
Monocytes: 4 % — ABNORMAL LOW (ref 5–12)
Number of Cells Counted: 100
Plt Count: 218 10*3/uL (ref 160–370)
Plt Count: 252 10*3/uL (ref 160–370)
Plt Est: NORMAL
RBC: 4.12 10*6/uL (ref 3.90–5.20)
RBC: 4.16 10*6/uL (ref 3.90–5.20)
RDW: 12.8 % (ref 12.0–14.0)
RDW: 12.9 % (ref 12.0–14.0)
Segs: 81 % — ABNORMAL HIGH (ref 34–71)
Segs: 81 % — ABNORMAL HIGH (ref 34–71)
WBC: 16.9 10*3/uL — ABNORMAL HIGH (ref 4.0–10.0)
WBC: 17.3 10*3/uL — ABNORMAL HIGH (ref 4.0–10.0)

## 2009-09-04 LAB — CPK-CREATINE PHOSPHOKINASE, BLOOD: CPK: 38 U/L (ref 0–175)

## 2009-09-04 LAB — BASIC METABOLIC PANEL, BLOOD
BUN: 3 mg/dL — ABNORMAL LOW (ref 6–20)
BUN: 3 mg/dL — ABNORMAL LOW (ref 6–20)
Bicarbonate: 21 mmol/L — ABNORMAL LOW (ref 22–29)
Bicarbonate: 17 mmol/L — ABNORMAL LOW (ref 22–29)
Calcium: 8.5 mg/dL — ABNORMAL LOW (ref 8.6–10.5)
Calcium: 8.5 mg/dL — ABNORMAL LOW (ref 8.6–10.5)
Chloride: 99 mmol/L (ref 98–107)
Chloride: 99 mmol/L (ref 98–107)
Creatinine: 0.49 mg/dL — ABNORMAL LOW (ref 0.51–0.95)
Creatinine: 0.46 mg/dL — ABNORMAL LOW (ref 0.51–0.95)
Glucose: 151 mg/dL — ABNORMAL HIGH (ref 70–115)
Glucose: 113 mg/dL (ref 70–115)
Potassium: 3.5 mmol/L (ref 3.5–5.1)
Potassium: 3.9 mmol/L (ref 3.5–5.1)
Sodium: 135 mmol/L — ABNORMAL LOW (ref 136–145)
Sodium: 135 mmol/L — ABNORMAL LOW (ref 136–145)

## 2009-09-04 LAB — PHOSPHORUS, BLOOD
Phosphorous: 2.5 mg/dL — ABNORMAL LOW (ref 2.7–4.5)
Phosphorous: 2.8 mg/dL (ref 2.7–4.5)

## 2009-09-04 LAB — CKMB+INDEX (NO CPK), BLOOD: CK-MB: 1.2 ng/mL (ref 0.0–2.8)

## 2009-09-04 LAB — MAGNESIUM, BLOOD
Magnesium: 1.9 mg/dL (ref 1.7–2.6)
Magnesium: 2 mg/dL (ref 1.7–2.6)

## 2009-09-04 LAB — TROPONIN T, BLOOD: Troponin T: <0.01 ng/mL (ref <0.01)

## 2009-09-04 LAB — GFR
GFR: >60 mL/min
GFR: >60 mL/min

## 2009-09-05 LAB — CBC WITH DIFF, BLOOD
Abs Eosinophils: 0.2 10*3/uL (ref 0.0–0.5)
Abs Lymphs: 2 10*3/uL (ref 0.8–3.1)
Abs Monos: 0.6 10*3/uL (ref 0.2–0.8)
Absolute Neutrophil Count: 12.1 10*3/uL — ABNORMAL HIGH (ref 1.6–7.0)
Eosinophils: 2 % (ref 1–7)
Hct: 36.4 % (ref 34.0–45.0)
Hgb: 12.1 gm/dL (ref 11.2–15.7)
Imm Gran Abs: 0.1 10*3/uL (ref 0.0–0.1)
Lymphocytes: 13 % — ABNORMAL LOW (ref 19–53)
MCH: 28.9 pg (ref 26.0–32.0)
MCHC: 33.2 % (ref 32.0–36.0)
MCV: 87.1 um3 (ref 79.0–95.0)
MPV: 9.6 fL (ref 9.4–12.4)
Monocytes: 4 % — ABNORMAL LOW (ref 5–12)
Plt Count: 271 10*3/uL (ref 160–370)
RBC: 4.18 10*6/uL (ref 3.90–5.20)
RDW: 12.7 % (ref 12.0–14.0)
Segs: 81 % — ABNORMAL HIGH (ref 34–71)
WBC: 15 10*3/uL — ABNORMAL HIGH (ref 4.0–10.0)

## 2009-09-05 LAB — BASIC METABOLIC PANEL, BLOOD
BUN: 3 mg/dL — ABNORMAL LOW (ref 6–20)
Bicarbonate: 21 mmol/L — ABNORMAL LOW (ref 22–29)
Calcium: 8.9 mg/dL (ref 8.6–10.5)
Chloride: 97 mmol/L — ABNORMAL LOW (ref 98–107)
Creatinine: 0.47 mg/dL — ABNORMAL LOW (ref 0.51–0.95)
Glucose: 132 mg/dL — ABNORMAL HIGH (ref 70–115)
Potassium: 3.7 mmol/L (ref 3.5–5.1)
Sodium: 133 mmol/L — ABNORMAL LOW (ref 136–145)

## 2009-09-05 LAB — MAGNESIUM, BLOOD: Magnesium: 2 mg/dL (ref 1.7–2.6)

## 2009-09-05 LAB — STANDARD O&P
Ova And Parasite Exam: NEGATIVE
Trichrome Statin: NEGATIVE

## 2009-09-05 LAB — PHOSPHORUS, BLOOD: Phosphorous: 2.7 mg/dL (ref 2.7–4.5)

## 2009-09-05 LAB — GFR: GFR: >60 mL/min

## 2009-09-06 LAB — CBC WITH DIFF, BLOOD
Abs Eosinophils: 0.3 10*3/uL (ref 0.0–0.5)
Abs Lymphs: 1.9 10*3/uL (ref 0.8–3.1)
Abs Monos: 0.5 10*3/uL (ref 0.2–0.8)
Absolute Neutrophil Count: 9.2 10*3/uL — ABNORMAL HIGH (ref 1.6–7.0)
Eosinophils: 2 % (ref 1–7)
Hct: 34.8 % (ref 34.0–45.0)
Hgb: 11.5 gm/dL (ref 11.2–15.7)
Imm Gran %: 1 % (ref 0–1)
Imm Gran Abs: 0.1 10*3/uL (ref 0.0–0.1)
Lymphocytes: 16 % — ABNORMAL LOW (ref 19–53)
MCH: 28.5 pg (ref 26.0–32.0)
MCHC: 33 % (ref 32.0–36.0)
MCV: 86.4 um3 (ref 79.0–95.0)
MPV: 9.4 fL (ref 9.4–12.4)
Monocytes: 5 % (ref 5–12)
Plt Count: 260 10*3/uL (ref 160–370)
RBC: 4.03 10*6/uL (ref 3.90–5.20)
RDW: 12.7 % (ref 12.0–14.0)
Segs: 77 % — ABNORMAL HIGH (ref 34–71)
WBC: 11.9 10*3/uL — ABNORMAL HIGH (ref 4.0–10.0)

## 2009-09-06 LAB — ECG, COMPLETE (HC/~~LOC~~/ENCINITAS)
ATRIAL RATE: 85 {beats}/min
ATRIAL RATE: 96 {beats}/min
ECG INTERPRETATION: NORMAL
ECG INTERPRETATION: NORMAL
P AXIS: 40 degrees
P AXIS: 64 degrees
PR INTERVAL: 174 ms
PR INTERVAL: 182 ms
QRS INTERVAL/DURATION: 92 ms
QRS INTERVAL/DURATION: 88 ms
QT: 382 ms
QT: 360 ms
QTc (Bazett): 454 ms
QTc (Bazett): 455 ms
R AXIS: 61 degrees
R AXIS: 52 degrees
T AXIS: 26 degrees
T AXIS: 41 degrees
VENTRICULAR RATE: 85 {beats}/min
VENTRICULAR RATE: 96 {beats}/min

## 2009-09-06 LAB — BASIC METABOLIC PANEL, BLOOD
BUN: 3 mg/dL — ABNORMAL LOW (ref 6–20)
Bicarbonate: 24 mmol/L (ref 22–29)
Calcium: 8.8 mg/dL (ref 8.6–10.5)
Chloride: 101 mmol/L (ref 98–107)
Creatinine: 0.45 mg/dL — ABNORMAL LOW (ref 0.51–0.95)
Glucose: 116 mg/dL — ABNORMAL HIGH (ref 70–115)
Potassium: 3.5 mmol/L (ref 3.5–5.1)
Sodium: 136 mmol/L (ref 136–145)

## 2009-09-06 LAB — STOOL CULTURE: Shiga Toxin Test: NEGATIVE

## 2009-09-06 LAB — YERSINIA CULTURE: Yersinia Culture: NO GROWTH

## 2009-09-06 LAB — MAGNESIUM, BLOOD: Magnesium: 2 mg/dL (ref 1.7–2.6)

## 2009-09-06 LAB — LACTATE, BLOOD: Lactate: 5.1 mg/dL (ref 4.5–19.8)

## 2009-09-06 LAB — VIBRIO CULTURE

## 2009-09-06 LAB — PHOSPHORUS, BLOOD: Phosphorous: 2.9 mg/dL (ref 2.7–4.5)

## 2009-09-06 LAB — GFR: GFR: >60 mL/min

## 2009-09-07 LAB — CBC WITH DIFF, BLOOD
Abs Eosinophils: 0.3 10*3/uL (ref 0.0–0.5)
Abs Lymphs: 3.2 10*3/uL — ABNORMAL HIGH (ref 0.8–3.1)
Abs Monos: 0.6 10*3/uL (ref 0.2–0.8)
Absolute Neutrophil Count: 7.4 10*3/uL — ABNORMAL HIGH (ref 1.6–7.0)
Eosinophils: 3 % (ref 1–7)
Hct: 35.4 % (ref 34.0–45.0)
Hgb: 11.7 gm/dL (ref 11.2–15.7)
Imm Gran Abs: 0.1 10*3/uL (ref 0.0–0.1)
Lymphocytes: 28 % (ref 19–53)
MCH: 28.6 pg (ref 26.0–32.0)
MCHC: 33.1 % (ref 32.0–36.0)
MCV: 86.6 um3 (ref 79.0–95.0)
MPV: 9.6 fL (ref 9.4–12.4)
Monocytes: 5 % (ref 5–12)
Plt Count: 305 10*3/uL (ref 160–370)
RBC: 4.09 10*6/uL (ref 3.90–5.20)
RDW: 12.8 % (ref 12.0–14.0)
Segs: 64 % (ref 34–71)
WBC: 11.6 10*3/uL — ABNORMAL HIGH (ref 4.0–10.0)

## 2009-09-07 LAB — GLUCOSE POCT, BLOOD: Meter Glucose (POC): 152 mg/dL — ABNORMAL HIGH (ref 70–110)

## 2009-09-07 LAB — BASIC METABOLIC PANEL, BLOOD
BUN: 3 mg/dL — ABNORMAL LOW (ref 6–20)
Bicarbonate: 25 mmol/L (ref 22–29)
Calcium: 9.2 mg/dL (ref 8.6–10.5)
Chloride: 100 mmol/L (ref 98–107)
Creatinine: 0.51 mg/dL (ref 0.51–0.95)
Glucose: 139 mg/dL — ABNORMAL HIGH (ref 70–115)
Potassium: 3.8 mmol/L (ref 3.5–5.1)
Sodium: 136 mmol/L (ref 136–145)

## 2009-09-07 LAB — PHOSPHORUS, BLOOD: Phosphorous: 2.9 mg/dL (ref 2.7–4.5)

## 2009-09-07 LAB — MAGNESIUM, BLOOD: Magnesium: 1.9 mg/dL (ref 1.7–2.6)

## 2009-09-07 LAB — GFR: GFR: >60 mL/min

## 2009-09-08 DIAGNOSIS — K55039 Acute (reversible) ischemia of large intestine, extent unspecified: Secondary | ICD-10-CM | POA: Insufficient documentation

## 2009-09-08 LAB — PHOSPHORUS, BLOOD: Phosphorous: 3.8 mg/dL (ref 2.7–4.5)

## 2009-09-08 LAB — CBC WITH DIFF, BLOOD
Abs Eosinophils: 0.2 10*3/uL (ref 0.0–0.5)
Abs Lymphs: 1.9 10*3/uL (ref 0.8–3.1)
Abs Monos: 0.4 10*3/uL (ref 0.2–0.8)
Absolute Neutrophil Count: 6.1 10*3/uL (ref 1.6–7.0)
Eosinophils: 3 % (ref 1–7)
Hct: 35.9 % (ref 34.0–45.0)
Hgb: 11.7 gm/dL (ref 11.2–15.7)
Imm Gran %: 1 % (ref 0–1)
Imm Gran Abs: 0.1 10*3/uL (ref 0.0–0.1)
Lymphocytes: 22 % (ref 19–53)
MCH: 28.4 pg (ref 26.0–32.0)
MCHC: 32.6 % (ref 32.0–36.0)
MCV: 87.1 um3 (ref 79.0–95.0)
MPV: 9.6 fL (ref 9.4–12.4)
Monocytes: 5 % (ref 5–12)
Plt Count: 289 10*3/uL (ref 160–370)
RBC: 4.12 10*6/uL (ref 3.90–5.20)
RDW: 12.9 % (ref 12.0–14.0)
Segs: 70 % (ref 34–71)
WBC: 8.7 10*3/uL (ref 4.0–10.0)

## 2009-09-08 LAB — BASIC METABOLIC PANEL, BLOOD
BUN: 4 mg/dL — ABNORMAL LOW (ref 6–20)
Bicarbonate: 24 mmol/L (ref 22–29)
Calcium: 8.8 mg/dL (ref 8.6–10.5)
Chloride: 100 mmol/L (ref 98–107)
Creatinine: 0.48 mg/dL — ABNORMAL LOW (ref 0.51–0.95)
Glucose: 149 mg/dL — ABNORMAL HIGH (ref 70–115)
Potassium: 3.9 mmol/L (ref 3.5–5.1)
Sodium: 140 mmol/L (ref 136–145)

## 2009-09-08 LAB — C.DIFFICILE AG/TOXIN
C.Difficile Antigen, GDH: NEGATIVE
C.Difficile Toxin: NEGATIVE

## 2009-09-08 LAB — GFR: GFR: >60 mL/min

## 2009-09-08 LAB — MAGNESIUM, BLOOD: Magnesium: 2 mg/dL (ref 1.7–2.6)

## 2009-09-08 MED ORDER — HYDROCODONE-ACETAMINOPHEN 5-500 MG OR TABS
1.0000 | ORAL_TABLET | Freq: Four times a day (QID) | ORAL | Status: DC | PRN
Start: 2009-09-08 — End: 2009-09-14

## 2009-09-08 MED ORDER — HYDROCODONE-ACETAMINOPHEN 5-500 MG OR TABS
1.0000 | ORAL_TABLET | Freq: Four times a day (QID) | ORAL | Status: DC | PRN
Start: 2009-09-08 — End: 2009-09-08

## 2009-09-08 MED ORDER — CIPROFLOXACIN HCL 500 MG OR TABS
500.0000 mg | ORAL_TABLET | Freq: Two times a day (BID) | ORAL | Status: DC
Start: 2009-09-08 — End: 2009-10-30

## 2009-09-08 MED ORDER — DOCUSATE SODIUM 100 MG OR CAPS
200.0000 mg | ORAL_CAPSULE | Freq: Two times a day (BID) | ORAL | Status: DC
Start: 2009-09-08 — End: 2009-09-26

## 2009-09-08 MED ORDER — METRONIDAZOLE 500 MG OR TABS
500.0000 mg | ORAL_TABLET | Freq: Three times a day (TID) | ORAL | Status: DC
Start: 2009-09-08 — End: 2009-10-30

## 2009-09-08 MED ORDER — ONDANSETRON HCL 4 MG OR TABS
4.0000 mg | ORAL_TABLET | Freq: Three times a day (TID) | ORAL | Status: DC | PRN
Start: 2009-09-08 — End: 2009-09-14

## 2009-09-09 LAB — BLOOD CULTURE: Blood Culture: NO GROWTH

## 2009-09-10 ENCOUNTER — Telehealth (INDEPENDENT_AMBULATORY_CARE_PROVIDER_SITE_OTHER): Payer: Self-pay | Admitting: Internal Medicine

## 2009-09-10 LAB — BLOOD CULTURE: Blood Culture: NO GROWTH

## 2009-09-13 ENCOUNTER — Other Ambulatory Visit (INDEPENDENT_AMBULATORY_CARE_PROVIDER_SITE_OTHER): Payer: Self-pay | Admitting: Internal Medicine

## 2009-09-14 ENCOUNTER — Ambulatory Visit (INDEPENDENT_AMBULATORY_CARE_PROVIDER_SITE_OTHER): Admitting: Internal Medicine

## 2009-09-14 ENCOUNTER — Encounter (INDEPENDENT_AMBULATORY_CARE_PROVIDER_SITE_OTHER): Payer: Self-pay | Admitting: Internal Medicine

## 2009-09-16 MED ORDER — DIAZEPAM 5 MG OR TABS
5.0000 mg | ORAL_TABLET | Freq: Two times a day (BID) | ORAL | Status: DC | PRN
Start: 2009-09-16 — End: 2009-09-28

## 2009-09-17 ENCOUNTER — Other Ambulatory Visit (INDEPENDENT_AMBULATORY_CARE_PROVIDER_SITE_OTHER): Payer: Self-pay | Admitting: Internal Medicine

## 2009-09-18 ENCOUNTER — Ambulatory Visit (HOSPITAL_BASED_OUTPATIENT_CLINIC_OR_DEPARTMENT_OTHER): Admitting: Retina Specialist

## 2009-09-18 MED ORDER — CARISOPRODOL 350 MG OR TABS
350.0000 mg | ORAL_TABLET | Freq: Three times a day (TID) | ORAL | Status: DC | PRN
Start: 2009-09-17 — End: 2009-09-27

## 2009-09-19 LAB — ECG, COMPLETE (HC/~~LOC~~/ENCINITAS)
ATRIAL RATE: 86 {beats}/min
ECG INTERPRETATION: NORMAL
P AXIS: 27 degrees
PR INTERVAL: 170 ms
QRS INTERVAL/DURATION: 88 ms
QT: 386 ms
QTc (Bazett): 462 ms
R AXIS: 50 degrees
T AXIS: 47 degrees
VENTRICULAR RATE: 86 {beats}/min

## 2009-09-26 ENCOUNTER — Encounter (HOSPITAL_BASED_OUTPATIENT_CLINIC_OR_DEPARTMENT_OTHER): Payer: Self-pay | Admitting: Surgery

## 2009-09-26 ENCOUNTER — Ambulatory Visit (HOSPITAL_BASED_OUTPATIENT_CLINIC_OR_DEPARTMENT_OTHER): Admitting: Surgery

## 2009-09-26 MED ORDER — DOCUSATE SODIUM 100 MG OR CAPS
200.0000 mg | ORAL_CAPSULE | Freq: Two times a day (BID) | ORAL | Status: DC
Start: 2009-09-26 — End: 2009-12-03

## 2009-09-27 ENCOUNTER — Other Ambulatory Visit (INDEPENDENT_AMBULATORY_CARE_PROVIDER_SITE_OTHER): Payer: Self-pay | Admitting: Internal Medicine

## 2009-09-28 MED ORDER — DIAZEPAM 5 MG OR TABS
5.0000 mg | ORAL_TABLET | Freq: Three times a day (TID) | ORAL | Status: DC | PRN
Start: 2009-09-27 — End: 2009-11-26

## 2009-09-28 MED ORDER — CARISOPRODOL 350 MG OR TABS
350.0000 mg | ORAL_TABLET | Freq: Three times a day (TID) | ORAL | Status: DC | PRN
Start: 2009-09-27 — End: 2009-10-30

## 2009-10-08 ENCOUNTER — Encounter (HOSPITAL_BASED_OUTPATIENT_CLINIC_OR_DEPARTMENT_OTHER): Payer: Self-pay | Admitting: Ophthalmology

## 2009-10-09 ENCOUNTER — Telehealth (HOSPITAL_BASED_OUTPATIENT_CLINIC_OR_DEPARTMENT_OTHER): Payer: Self-pay

## 2009-10-10 ENCOUNTER — Encounter (HOSPITAL_BASED_OUTPATIENT_CLINIC_OR_DEPARTMENT_OTHER): Admitting: Cardiovascular Disease

## 2009-10-10 ENCOUNTER — Encounter (HOSPITAL_BASED_OUTPATIENT_CLINIC_OR_DEPARTMENT_OTHER): Admitting: Surgery

## 2009-10-11 ENCOUNTER — Other Ambulatory Visit (INDEPENDENT_AMBULATORY_CARE_PROVIDER_SITE_OTHER): Payer: Self-pay | Admitting: Internal Medicine

## 2009-10-16 ENCOUNTER — Ambulatory Visit (HOSPITAL_BASED_OUTPATIENT_CLINIC_OR_DEPARTMENT_OTHER): Admitting: Retina Specialist

## 2009-10-17 ENCOUNTER — Other Ambulatory Visit (INDEPENDENT_AMBULATORY_CARE_PROVIDER_SITE_OTHER): Payer: Self-pay | Admitting: Nurse Practitioner

## 2009-10-17 ENCOUNTER — Encounter (HOSPITAL_BASED_OUTPATIENT_CLINIC_OR_DEPARTMENT_OTHER): Admitting: Interventional Cardiology

## 2009-10-17 ENCOUNTER — Telehealth (INDEPENDENT_AMBULATORY_CARE_PROVIDER_SITE_OTHER): Payer: Self-pay | Admitting: Internal Medicine

## 2009-10-19 ENCOUNTER — Encounter (INDEPENDENT_AMBULATORY_CARE_PROVIDER_SITE_OTHER): Admitting: Internal Medicine

## 2009-10-23 ENCOUNTER — Telehealth (HOSPITAL_BASED_OUTPATIENT_CLINIC_OR_DEPARTMENT_OTHER): Payer: Self-pay

## 2009-10-24 ENCOUNTER — Ambulatory Visit (HOSPITAL_BASED_OUTPATIENT_CLINIC_OR_DEPARTMENT_OTHER): Payer: Self-pay | Admitting: Student in an Organized Health Care Education/Training Program

## 2009-10-24 VITALS — BP 172/128 | HR 88 | Temp 98.7°F | Resp 18 | Ht 64.0 in | Wt 172.0 lb

## 2009-10-24 MED ORDER — CARVEDILOL 12.5 MG OR TABS
6.1250 mg | ORAL_TABLET | Freq: Two times a day (BID) | ORAL | Status: DC
Start: 2009-10-24 — End: 2009-11-27

## 2009-10-24 MED ORDER — CLONIDINE HCL 0.3 MG OR TABS
0.3000 mg | ORAL_TABLET | Freq: Three times a day (TID) | ORAL | Status: DC
Start: 2009-10-24 — End: 2010-06-14

## 2009-10-30 ENCOUNTER — Encounter (INDEPENDENT_AMBULATORY_CARE_PROVIDER_SITE_OTHER): Payer: Self-pay | Admitting: Internal Medicine

## 2009-10-30 ENCOUNTER — Ambulatory Visit (INDEPENDENT_AMBULATORY_CARE_PROVIDER_SITE_OTHER): Admitting: Internal Medicine

## 2009-10-30 VITALS — BP 149/107 | HR 76 | Temp 98.2°F | Resp 16 | Ht 64.0 in | Wt 175.0 lb

## 2009-10-30 MED ORDER — CARISOPRODOL 350 MG OR TABS
350.0000 mg | ORAL_TABLET | Freq: Three times a day (TID) | ORAL | Status: DC | PRN
Start: 2009-10-30 — End: 2009-11-26

## 2009-10-30 MED ORDER — TRAMADOL HCL 50 MG OR TABS
50.0000 mg | ORAL_TABLET | Freq: Four times a day (QID) | ORAL | Status: DC | PRN
Start: 2009-10-30 — End: 2009-12-10

## 2009-11-05 ENCOUNTER — Encounter (INDEPENDENT_AMBULATORY_CARE_PROVIDER_SITE_OTHER): Admitting: Internal Medicine

## 2009-11-19 ENCOUNTER — Encounter (INDEPENDENT_AMBULATORY_CARE_PROVIDER_SITE_OTHER): Admitting: Internal Medicine

## 2009-11-19 ENCOUNTER — Telehealth (INDEPENDENT_AMBULATORY_CARE_PROVIDER_SITE_OTHER): Payer: Self-pay | Admitting: Internal Medicine

## 2009-11-20 NOTE — Telephone Encounter (Signed)
OK to overbook at 1:00 on Monday, the 18th - will squeeze her in as time allows in the afternoon -may not be seen right at 1:00.    Dr. Melissa Montane

## 2009-11-21 ENCOUNTER — Other Ambulatory Visit (INDEPENDENT_AMBULATORY_CARE_PROVIDER_SITE_OTHER): Payer: Self-pay | Admitting: Internal Medicine

## 2009-11-21 ENCOUNTER — Encounter (INDEPENDENT_AMBULATORY_CARE_PROVIDER_SITE_OTHER): Admitting: Internal Medicine

## 2009-11-26 ENCOUNTER — Ambulatory Visit (INDEPENDENT_AMBULATORY_CARE_PROVIDER_SITE_OTHER): Admitting: Internal Medicine

## 2009-11-26 ENCOUNTER — Other Ambulatory Visit (HOSPITAL_BASED_OUTPATIENT_CLINIC_OR_DEPARTMENT_OTHER): Payer: Self-pay | Admitting: Cardiovascular Disease

## 2009-11-26 VITALS — BP 150/98 | HR 72 | Temp 98.3°F | Resp 16 | Ht 64.0 in | Wt 173.0 lb

## 2009-11-26 MED ORDER — DIAZEPAM 5 MG OR TABS
5.0000 mg | ORAL_TABLET | Freq: Three times a day (TID) | ORAL | Status: DC | PRN
Start: 2009-11-26 — End: 2009-12-25

## 2009-11-26 MED ORDER — CARISOPRODOL 350 MG OR TABS
350.0000 mg | ORAL_TABLET | Freq: Two times a day (BID) | ORAL | Status: DC
Start: 2009-11-26 — End: 2009-12-25

## 2009-11-27 ENCOUNTER — Encounter (HOSPITAL_BASED_OUTPATIENT_CLINIC_OR_DEPARTMENT_OTHER): Admitting: Retina Specialist

## 2009-11-27 ENCOUNTER — Other Ambulatory Visit (HOSPITAL_BASED_OUTPATIENT_CLINIC_OR_DEPARTMENT_OTHER): Payer: Self-pay

## 2009-11-28 MED ORDER — CARVEDILOL 12.5 MG OR TABS
12.5000 mg | ORAL_TABLET | Freq: Two times a day (BID) | ORAL | Status: DC
Start: 2009-11-27 — End: 2009-12-25

## 2009-11-29 ENCOUNTER — Other Ambulatory Visit (HOSPITAL_BASED_OUTPATIENT_CLINIC_OR_DEPARTMENT_OTHER): Payer: Self-pay

## 2009-12-03 ENCOUNTER — Ambulatory Visit (HOSPITAL_BASED_OUTPATIENT_CLINIC_OR_DEPARTMENT_OTHER)

## 2009-12-03 ENCOUNTER — Other Ambulatory Visit (HOSPITAL_BASED_OUTPATIENT_CLINIC_OR_DEPARTMENT_OTHER): Payer: Self-pay

## 2009-12-03 NOTE — Progress Notes (Signed)
Pt is in clinic today for RN visit. Pt was last seen 3/11 and was started on coreg 6.25mg  BID and it was increased this past weekend to 12.5mg  BID. Pt denies any ill effects from the increase. She is complaining of bloating and some edema in her extremities but states she is finishing her menses and that may be the cause along with the increased coreg. Pt BP in clinic was 164/112 however this morning at home it was 130/92 then at lunch today 140/98 and yesterday it was 110/70. She states it fluctuated up and down but is generally between SBP 120-140's and DBP 90-110. She states when she comes into clinic she gets nervous and believes this is why it is elevated. She was told to call the clinic if her BP at home begins to increase.

## 2009-12-04 MED ORDER — DOCUSATE SODIUM 100 MG OR CAPS
200.0000 mg | ORAL_CAPSULE | Freq: Two times a day (BID) | ORAL | Status: DC
Start: 2009-12-03 — End: 2010-06-26

## 2009-12-04 MED ORDER — AMLODIPINE 10 MG OR TABS
10.0000 mg | ORAL_TABLET | Freq: Every day | ORAL | Status: DC
Start: 2009-12-03 — End: 2010-12-18

## 2009-12-04 MED ORDER — SIMVASTATIN 10 MG OR TABS
10.0000 mg | ORAL_TABLET | Freq: Every evening | ORAL | Status: DC
Start: 2009-12-03 — End: 2010-12-02

## 2009-12-04 MED ORDER — LISINOPRIL 40 MG OR TABS
40.0000 mg | ORAL_TABLET | Freq: Every day | ORAL | Status: DC
Start: 2009-12-03 — End: 2010-06-13

## 2009-12-07 ENCOUNTER — Other Ambulatory Visit (INDEPENDENT_AMBULATORY_CARE_PROVIDER_SITE_OTHER): Payer: Self-pay | Admitting: Internal Medicine

## 2009-12-10 MED ORDER — TRAMADOL HCL 50 MG OR TABS
50.0000 mg | ORAL_TABLET | Freq: Four times a day (QID) | ORAL | Status: DC | PRN
Start: 2009-12-10 — End: 2010-01-14

## 2009-12-11 ENCOUNTER — Encounter (HOSPITAL_BASED_OUTPATIENT_CLINIC_OR_DEPARTMENT_OTHER): Admitting: Retina Specialist

## 2009-12-21 ENCOUNTER — Telehealth (INDEPENDENT_AMBULATORY_CARE_PROVIDER_SITE_OTHER): Payer: Self-pay | Admitting: Marriage & Family Therapist

## 2009-12-25 ENCOUNTER — Other Ambulatory Visit (HOSPITAL_BASED_OUTPATIENT_CLINIC_OR_DEPARTMENT_OTHER): Payer: Self-pay

## 2009-12-25 ENCOUNTER — Other Ambulatory Visit (INDEPENDENT_AMBULATORY_CARE_PROVIDER_SITE_OTHER): Payer: Self-pay | Admitting: Internal Medicine

## 2009-12-25 MED ORDER — DIAZEPAM 5 MG OR TABS
5.0000 mg | ORAL_TABLET | Freq: Four times a day (QID) | ORAL | Status: DC | PRN
Start: 2009-12-25 — End: 2010-01-22

## 2009-12-25 MED ORDER — CARVEDILOL 12.5 MG OR TABS
12.5000 mg | ORAL_TABLET | Freq: Two times a day (BID) | ORAL | Status: DC
Start: 2009-12-25 — End: 2010-06-26

## 2009-12-25 MED ORDER — CARISOPRODOL 350 MG OR TABS
350.0000 mg | ORAL_TABLET | Freq: Two times a day (BID) | ORAL | Status: DC
Start: 2009-12-25 — End: 2010-01-22

## 2009-12-25 NOTE — Telephone Encounter (Signed)
Pended and routed to MD for approval.

## 2010-01-04 ENCOUNTER — Telehealth (INDEPENDENT_AMBULATORY_CARE_PROVIDER_SITE_OTHER): Payer: Self-pay | Admitting: Marriage & Family Therapist

## 2010-01-14 ENCOUNTER — Other Ambulatory Visit (INDEPENDENT_AMBULATORY_CARE_PROVIDER_SITE_OTHER): Payer: Self-pay | Admitting: Internal Medicine

## 2010-01-14 MED ORDER — TRAMADOL HCL 50 MG OR TABS
50.0000 mg | ORAL_TABLET | Freq: Four times a day (QID) | ORAL | Status: DC | PRN
Start: 2010-01-14 — End: 2010-02-20

## 2010-01-22 ENCOUNTER — Other Ambulatory Visit (INDEPENDENT_AMBULATORY_CARE_PROVIDER_SITE_OTHER): Payer: Self-pay | Admitting: Internal Medicine

## 2010-01-22 NOTE — Telephone Encounter (Signed)
Per phone encounter 12/25/09, pt should be tapering down Soma 350 mg.  Pended and routed to MD for review.

## 2010-01-22 NOTE — Telephone Encounter (Signed)
 Prescription Refill Request Received via fax   Last visit Date:10/30/09    Last Refill request per fax:12/25/09    Medications:  1. Medication Requested:VALIUM 5 MG TABLET   Last Fill:12/25/09  2. Medication Requested:SOMA 350 MG TABLET   Last Fill:12/25/09      Name of Pharmacy in patients demographics?YES  If not please update patient's pharmacy demographics.

## 2010-01-25 NOTE — Telephone Encounter (Signed)
 Pt requesting call back today in regards to medication below. Pt requesting status on medication.

## 2010-01-25 NOTE — Telephone Encounter (Signed)
 Pt requesting call back from nurse in regards to medication status below. Pt stating she will be out of medication today.

## 2010-01-28 MED ORDER — CARISOPRODOL 350 MG OR TABS
350.0000 mg | ORAL_TABLET | Freq: Two times a day (BID) | ORAL | Status: DC
Start: 2010-01-22 — End: 2010-02-28

## 2010-01-28 MED ORDER — DIAZEPAM 5 MG OR TABS
5.0000 mg | ORAL_TABLET | Freq: Four times a day (QID) | ORAL | Status: DC | PRN
Start: 2010-01-22 — End: 2010-02-28

## 2010-01-28 NOTE — Telephone Encounter (Signed)
 Reviewed documentation.    The patient has a strong potential for addiction and has been advised by Dr. Melissa Montane to follow up with [facility] AND has been advised to taper off the SOMA as it is not meant for long term use. (this has been discussed with patient multiple times)    [facility] has attempted to contact pt for follow up but they have been unable to reach her.    I will refill her valium and provide her with a limited script for soma but she will have to see dr. Melissa Montane again.    I personally do not feel that there has been anything unprofessional in this case but pt is welcome to speak with office manager.

## 2010-01-28 NOTE — Telephone Encounter (Signed)
 Patient is calling this morning very upset in regards to her refill request from last Tuesday 01/22/10. Patient states that she has not received a call back from nurse or from MD giving her status of her refill request. Patient went on to say "how un-professional that since last week she requested this and no one has followed up with her"  Patient is very upset and would like to also speak to Engineer, manufacturing in regards to this.   Please call patient at [phone]

## 2010-01-29 NOTE — Telephone Encounter (Addendum)
 Call placed to patient per request on June 14,    Katherine Church just wanted to provide feedback in regards to acknowledging patients requests. Katherine Church felt ignored because no one could provide her an update of medication been approved or denied, it took more then 4 days to get a response from the provider. Katherine Church also wanted to be clear that she appreciate the Internal Medicine department but wanted to provide feedback to make things better.    I apologized for inconvenience and thank patient for feedback.

## 2010-02-19 ENCOUNTER — Other Ambulatory Visit (INDEPENDENT_AMBULATORY_CARE_PROVIDER_SITE_OTHER): Payer: Self-pay | Admitting: Internal Medicine

## 2010-02-19 NOTE — Telephone Encounter (Signed)
 Verbally confirmed name of Primary Care Provider: yes    Last visit Date: 10/30/09    Medications:  1.  traMADol (ULTRAM) 50 MG tablet     Name of Pharmacy Updated in Demographics: yes    Name and phone number of pharmacy if not in Epic Database:

## 2010-02-20 MED ORDER — TRAMADOL HCL 50 MG OR TABS
50.0000 mg | ORAL_TABLET | Freq: Four times a day (QID) | ORAL | Status: DC | PRN
Start: 2010-02-20 — End: 2010-03-11

## 2010-02-20 NOTE — Telephone Encounter (Signed)
 Pended and routed to MD for review.  Last filled:  01/14/10 #60.

## 2010-02-28 ENCOUNTER — Ambulatory Visit (INDEPENDENT_AMBULATORY_CARE_PROVIDER_SITE_OTHER): Admitting: Neurology

## 2010-02-28 ENCOUNTER — Encounter (INDEPENDENT_AMBULATORY_CARE_PROVIDER_SITE_OTHER): Payer: Self-pay | Admitting: Neurology

## 2010-02-28 VITALS — BP 140/108 | HR 87 | Temp 98.5°F | Resp 18 | Ht 64.0 in | Wt 175.0 lb

## 2010-02-28 MED ORDER — CARISOPRODOL 350 MG OR TABS
350.0000 mg | ORAL_TABLET | ORAL | Status: DC
Start: 2010-02-28 — End: 2010-03-29

## 2010-02-28 MED ORDER — DIAZEPAM 5 MG OR TABS
5.0000 mg | ORAL_TABLET | Freq: Two times a day (BID) | ORAL | Status: DC | PRN
Start: 2010-02-28 — End: 2010-03-29

## 2010-02-28 NOTE — Progress Notes (Signed)
 Chief Complaint   Patient presents with   . Constipation   . Blood Pressure       SUBJECTIVE::Katherine Church is a 44 year old female here for follow up of constipation, diabetes and anxiety.    1) Constipation: Has sm pellet-like BMs each day until the past few days. Takes Colace, but added OTC Ducolax when she ceased having any BM, which allowed her to have a "loose/watery" BM. Aldactone was stopped during her hospitalization at the end of January for acute ischemic colitis as it was thought this may have contributed to the colitis. She endorses bloating, nausea, early satiety, at times not being able to lie on her stomach due to pain, but no vomiting.  The pain feels similar to that which brought her in to the hospital, but now is without hematochezia. Pain is intermittently 8-9/10 and at other times is "fine".  Tresa Garter has helped the "spasm" sensation in her abdomen subside, which helps her sleep.  She did not make a follow-up appointment with GI after discharge, as she has no insurance and did not think she would be able to pay for any procedures.  Has decreased the fiber in her diet, per patient, according with recommendations on the colitis management.      2) Diabetes: Walking 3x/week, eating mostly rice, chicken, fish.  Fasting glucose at home 150s.  120s after a walk.  Only had one episode where she felt hypoglycemic: was walking and felt shaky, sweaty, faint.  This was relieved by eating something.  She carries a candy/snack with her at all times.  She wears shoes always outside the home and checks her feet.  Last HbA1c=6.7  (05/2009)    3) Hypertension: followed by cardiology for management of blood pressure (refractory to multiple medications, first diagnosed at age 31).  Lasix was stopped after her hospitalization with the thought this has contributed to her recent episode of ischiemic colitis, and she has had some mild extremity swelling since. Twice she has had acute onset headaches with "soreness  above the ear", during which time she measured her blood pressure and it was not elevated. Pt reports she is able to tell when her blood pressure is elevated, and the resulting headache from BP was a different sensation.     4) Anxiety: Taking Valium 3x/day, not having panic attacks any more.  Still very shaken by episode in 2007 of being the one to find her father deceased when he passed away.  No SI/HI.  Feeling much less overwhelmed than when she first moved to [facility] from [place]; now is trying to tackle one health problem at a time.  The only source of feeling out of control for her now is with respect to her constipation.  She is unemployed, but keeps herself busy by learning history and walking to museums and shops at least once a week.    5) Chronic Back pain, numbness R posterior thigh: takes Tramadol TID and soma for spasms in back. Reports L back pain was resolved by doctor in [place] performing spine manipulation, which ended up aggravating the R side. Reports numbness in R leg and foot as well as favoring walking on the R side.  Patient's pain managed on Tramadol.  Seen by Dr. Melissa Montane in 11/2009 for pain consult and it was recommended that she continue with Tramadol for pain management and to wean off Soma as little long term benefit but very addictive.  Also because of risk of addiction, did  not recommend opiates at this time for pain.  Tresa Garter has been weaned from TID to BID and last Rx was given for once daily however patient has been using it more often as she feels it calms her GI symptoms and back spasms.    Please see last note in Epic,   Patient Active Problem List   Diagnoses Date Noted   . Screening for malignant neoplasm of breast [V76.21F] 09/14/2009   . Acute ischemic colitis [557.0B] 09/08/2009   . Abdominal pain [789.00AP] 09/02/2009   . Hypertension, malignant [401.0E] 05/22/2009   . Retinal hemorrhage [362.81] 05/22/2009   . Diabetes [250.00N] 05/18/2009   . Hypertension [401.9AH]  05/18/2009   . Chronic back pain [724.5AP] 05/18/2009   . Anxiety [300.00E] 05/18/2009       Social History:  History   Substance Use Topics   . Smoking status: Passive Smoker     Types: Cigarettes   . Smokeless tobacco: Never Used   . Alcohol Use: No       Allergies:  Allergies   Allergen Reactions   . Tetanus Toxoids Swelling       Current outpatient prescriptions   Medication Sig   . diazepam (VALIUM) 5 MG tablet Take 1 tablet by mouth every 12 hours as needed for Anxiety.   . carisoprodol (SOMA) 350 MG tablet Take 1 tablet by mouth As Directed.   . traMADol (ULTRAM) 50 MG tablet Take 1 tablet by mouth every 6 hours as needed for Moderate Pain (Pain Score 4-6).   . carvedilol (COREG) 12.5 MG tablet Take 1 tablet by mouth 2 times daily.   . simvastatin (ZOCOR) 10 MG tablet Take 1 tablet by mouth every evening.   . docusate sodium (COLACE) 100 MG capsule Take 2 capsules by mouth 2 times daily.   Marland Kitchen lisinopril (PRINIVIL, ZESTRIL) 40 MG tablet Take 1 tablet by mouth daily.   Marland Kitchen amLODIPINE (NORVASC) 10 MG tablet Take 1 tablet by mouth daily.   . clonidine (CATAPRES) 0.3 MG tablet Take 1 tablet by mouth 3 times daily.   Marland Kitchen DISCONTD: diazepam (VALIUM) 5 MG tablet Take 1 tablet by mouth every 6 hours as needed for Anxiety.   Marland Kitchen DISCONTD: carisoprodol (SOMA) 350 MG tablet Take 1 tablet by mouth 2 times daily.       ROS:12 point review of systems, reviewed and negative except as per HPI and PMHx.   OBJECTIVE:: BP 140/108  Pulse 87  Temp(Src) 98.5 F (36.9 C) (Oral)  Resp 18  Ht 5\' 4"  (1.626 m)  Wt 79.379 kg (175 lb)  BMI 30.04 kg/m2  LMP 02/21/2010 BP rechecked: 180s systolic bilaterally.  General Appearance: healthy, alert, no distress, pleasant affect, cooperative.  Head: sinuses nontender, oropharynx clear, TMs clear.  Eyes: conjunctivae and corneas clear. Discs sharp on fundoscopic exam. Vision 20/30 R eye; loss of central vision L eye.  Mouth: normal.  Neck: Neck supple. No adenopathy, thyroid symmetric, fuller  than normal size. No JVD.  Heart: normal rate and regular rhythm, no murmurs, clicks, or gallops.  Lungs: clear to auscultation, no chest deformities noted.  Abdomen: faint, hypoactive BS. Abdomen soft, with firm areas appreciated on deep palpation in all quadrants. Non-tender.  No hepatosplenomegaly evident, although difficult to appreciate due to body habitus.   Extremities: no cyanosis, clubbing, or edema. 2+ DPs.  Monofilament: passed exam minus one area where callous present on foot.    Skin: no diaphoresis; skin intact on feet. (superficial layer partially removed  due to application of pumice on calluses)    Recent labs, imaging and chart reviewed.  A/P:    1) Constipation: inadequately controlled with Colace.  Given her history, there is concern for possible gastroparesis due to her DM2.  Would also want to evaluate for hypothyroidism given exam findings.  Will check A1c and TSH.  Instructed patient she may take OTC fleet's enema, and to continue high PO water intake. Pt also agreed to begin a taper of her Valium and Soma.    2) Diabetes: suboptimally controlled on diet based on her home FSG readings, but will check microalbumin, fasting lipids and A1c.  If diabetes poorly controlled, would consider gastroparesis as possible reason for feelings of bloating and consider a trial of Reglan before meals.    3) Pain, anxiety: beginning a taper of Valium from TID to BID, and of soma from BID to once a day.    4) HTN: Continue current regimen, follow up with cardiology clinic    Health care maintenance: due for follow-up mammogram after non-concerning "spot" on her last one in [place] was performed (films still in [place]). Pt will seek out [facility] at [facility] for [facility] program which can offer free mammography as patient has no insurance and must pay cash for all health care.    Insurance status: patient given contact information for CMS. She says she did not qualify for  patient assistance program based on inheritance plans for acquiring her mother's home in [place].    Naveyah was seen today for constipation and blood pressure.    Diagnoses and associated orders for this visit:    Diabetes  - Glycosylated Hgb(A1C), Blood Lavender  - Random Urine Microalb/Creat Ratio  - Fasting Lipid Panel Yellow serum separator tube  -     Foot exam (microfilament) done today    Anxiety  - diazepam (VALIUM) 5 MG tablet; Take 1 tablet by mouth every 12 hours as needed for Anxiety.    Back pain  - carisoprodol (SOMA) 350 MG tablet; Take 1 tablet by mouth As Directed.    Abdominal pain  - carisoprodol (SOMA) 350 MG tablet; Take 1 tablet by mouth As Directed.  - TSH, Blood Yellow serum separator tube    Constipation  - TSH, Blood Yellow serum separator tube  -     Recommend Fleets enema x 1 for now then continue colace and start 1 tsp. Of Miralex a day  -     Drink lots of fluid    Other Orders  - Cancel: Screening mammogram at [place] for insurance reasons explained above          Follow-up in 3 week(s).  Patient Instruction:  See Patient Education/Instruction section.   Patient was educated on clinical, laboratory and imaging findings.   Barriers to Learning assessed: none. All of the patients questions were answered to patient's satisfaction. Patient verbalizes understanding of teaching

## 2010-02-28 NOTE — Patient Instructions (Signed)
-   For constipation, continue Colace, but also try over-the-counter Fleets enema.  Try 1 first, but may use up to 2. Continue to drink plenty of water.  - Obtain mammogram at [facility]  - Go to lab for blood and urine test (fasting)  - Call CMS directly to check if you can qualify for their health insurance  - Taper Valium to twice a day; Soma to once a day    - Follow up in our clinic, or at a community clinic if you qualify for CMS  - Follow up in cardiology clinic for blood pressure

## 2010-02-28 NOTE — Progress Notes (Signed)
 ATTENDING NOTE      Chief Complaint   Patient presents with   . Constipation   . Blood Pressure         Subjective:   I reviewed the history and physical exam with the resident.  Katherine Church is a 44 year old female who presents for follow-up of treatment resistant hypertension, complaints of chronic constipation and chronic LBP.  Patient also has Type 2 DM that has been well-controlled in past with diet alone.  Continues to watch her diet.      Patient has had several episodes of hypertensive urgency and was admitted for acute ischemic colitis in 09/2009 which almost resulted in a colectomy.  Patient's bp is managed in cardiology where her diuretic was recently d/c'd as it was thought to have contributed to her colitis.  Although bp slightly elevated today, pt reports bp has been in lower range at home.  Denies any symptoms of HA or chest pain currently.    Chronic LBP:  See recent consult by Dr. Foye Clock  In 11/2009 in regards to pain management.    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  She must pay cash for all health care and this has been a source of stress for the patient.  Today she was given the number to [facility] to see if she might qualify for services.     Review of Systems (ROS): As per  the resident's note.  Past Medical, Family, Social History:  As per  the resident's  note.      Objective: BP 140/108  Pulse 87  Temp(Src) 98.5 F (36.9 C) (Oral)  Resp 18  Ht 5\' 4"  (1.626 m)  Wt 79.379 kg (175 lb)  BMI 30.04 kg/m2  LMP 02/21/2010    I concur with the resident's exam.    Medical Plan of Care:     Assessment and plan reviewed with the resident physician.  I agree with the resident's plan as documented.    See the resident's note for further details.

## 2010-03-11 ENCOUNTER — Other Ambulatory Visit (INDEPENDENT_AMBULATORY_CARE_PROVIDER_SITE_OTHER): Payer: Self-pay | Admitting: Neurology

## 2010-03-11 NOTE — Telephone Encounter (Signed)
 No Shows: none  Last refilled: 02/20/10 disp.#60 x 0 refills  Recent ED visits: none  Comment: routing to Dr. Robin Searing for review.

## 2010-03-11 NOTE — Telephone Encounter (Signed)
 Prescription Refill Request Received via fax    Last visit Date: 7.21.11  Last Refill request per fax:  7.13.11  Next OV: NONE    Medications:   1. TRAMADOL HCL 50 MG OR TABS    Name of Pharmacy in patients demographics? Yes   If not please update patient's pharmacy demographics.

## 2010-03-14 MED ORDER — TRAMADOL HCL 50 MG OR TABS
50.0000 mg | ORAL_TABLET | Freq: Four times a day (QID) | ORAL | Status: DC | PRN
Start: 2010-03-11 — End: 2010-04-23

## 2010-03-14 NOTE — Telephone Encounter (Signed)
 Pt is calling back regarding the rx refill below. Pt stated she is completely out. Please call pt once rx is called in at the pharmacy so she can pick up the rx.

## 2010-03-14 NOTE — Telephone Encounter (Signed)
Pt notified refill approved.

## 2010-03-14 NOTE — Telephone Encounter (Signed)
 Pt out of medication and requesting meds today. Pt has waited turnaround time. Please call Pt when complete.

## 2010-03-14 NOTE — Telephone Encounter (Signed)
 Dr. Conley Rolls is no longer pt's PCP. Pt's new PCP is Dr. Robin Searing. Last office visit 02/28/10. Routing to PCP for review and approval.

## 2010-03-14 NOTE — Telephone Encounter (Addendum)
 Routing to attending Dr. Conley Rolls as pt out of med's.

## 2010-03-29 ENCOUNTER — Other Ambulatory Visit (INDEPENDENT_AMBULATORY_CARE_PROVIDER_SITE_OTHER): Payer: Self-pay | Admitting: Neurology

## 2010-03-29 NOTE — Telephone Encounter (Signed)
 Pended refill(s) and routing to MD for review and approval

## 2010-03-29 NOTE — Telephone Encounter (Signed)
 Verbally confirmed name of Primary Care Provider: yes    Last visit Date: 02/28/10    Medications:  1. diazepam (VALIUM) 5 MG tablet  2. carisoprodol (SOMA) 350 MG tablet    Name of Pharmacy Updated in Demographics: yes    Name and phone number of pharmacy if not in Epic Database:

## 2010-04-01 MED ORDER — DIAZEPAM 5 MG OR TABS
5.0000 mg | ORAL_TABLET | Freq: Two times a day (BID) | ORAL | Status: DC | PRN
Start: 2010-03-29 — End: 2010-05-02

## 2010-04-01 MED ORDER — CARISOPRODOL 350 MG OR TABS
350.0000 mg | ORAL_TABLET | ORAL | Status: DC
Start: 2010-03-29 — End: 2010-05-02

## 2010-04-01 NOTE — Telephone Encounter (Signed)
 No response routing to [employer] Dr. Arletta Bale and paging Dr. Robin Searing w/ request.

## 2010-04-01 NOTE — Telephone Encounter (Signed)
 Discussed w/ Dr. Robin Searing she will discuss w/ Dr. Soundra Pilon and advise on direction.

## 2010-04-01 NOTE — Telephone Encounter (Signed)
 Patient states she needs refill below asap. Patient claims that pharmacy send out request since last Monday 03/25/2010 and they did not received anything. Patient called on Friday to check on status and nothing has been done. Patient needs to obtain her medication asap due to having anxiety. Please contact patient once it has been called in.   [phone]

## 2010-04-01 NOTE — Telephone Encounter (Signed)
 Meds refilled for a month. Please schedule the patitnt for a follow-up appointment with Dr. Arletta Bale in four weeks. Please close the encounter after speaking with the patient.

## 2010-04-01 NOTE — Telephone Encounter (Signed)
 [facility] 2nd request for DIAZEPAM 5 MG OR TABS and CARISOPRODOL 350 MG OR TABS.

## 2010-04-02 NOTE — Telephone Encounter (Signed)
 Attempted to contact patient, unable to leave voicemail at number noted on file. Will attempt to contact patient at a later time.

## 2010-04-03 NOTE — Telephone Encounter (Signed)
 Contacted pt- appt was scheduled for Thurs 9/22 @2 :30 w/ Dr Arletta Bale.

## 2010-04-03 NOTE — Telephone Encounter (Signed)
 Thank you Rejeana Brock! Dr. Diannia Ruder

## 2010-04-23 ENCOUNTER — Other Ambulatory Visit (INDEPENDENT_AMBULATORY_CARE_PROVIDER_SITE_OTHER): Payer: Self-pay | Admitting: Neurology

## 2010-04-23 NOTE — Telephone Encounter (Signed)
 Verbally confirmed name of Primary Care Provider: yes    Last visit Date: 02/28/10    Medications:  1. traMADol (ULTRAM) 50 MG tablet     Name of Pharmacy Updated in Demographics: yes    Name and phone number of pharmacy if not in Epic Database:

## 2010-04-23 NOTE — Telephone Encounter (Signed)
Pended refill(s) and routing to MD for review and approval

## 2010-04-24 NOTE — Telephone Encounter (Signed)
 Pharmacy 2nd request for TRAMADOL HCL 50 MG OR TABS. Pharmacy stated pt is out of med's.

## 2010-04-24 NOTE — Telephone Encounter (Signed)
 Pt calling requesting status on refill below. Pt out of medication.

## 2010-04-25 MED ORDER — TRAMADOL HCL 50 MG OR TABS
50.0000 mg | ORAL_TABLET | Freq: Three times a day (TID) | ORAL | Status: DC | PRN
Start: 2010-04-23 — End: 2010-05-23

## 2010-04-25 NOTE — Telephone Encounter (Signed)
 Takes 1 tab 2 - 3 times daily. Rx # 60

## 2010-04-25 NOTE — Telephone Encounter (Signed)
 Routing to attending Dr. Elenora Gamma as no response from resident thank you

## 2010-04-25 NOTE — Telephone Encounter (Signed)
 Patient is calling this morning again to request status of her medication refill for tramadol. Patient was reminded of turn around time for refill request. Patient understood and would appreciate a call back with status.

## 2010-05-02 ENCOUNTER — Encounter (INDEPENDENT_AMBULATORY_CARE_PROVIDER_SITE_OTHER): Payer: Self-pay | Admitting: Rheumatology

## 2010-05-02 ENCOUNTER — Ambulatory Visit (INDEPENDENT_AMBULATORY_CARE_PROVIDER_SITE_OTHER): Admitting: Rheumatology

## 2010-05-02 LAB — GLYCOSYLATED HGB(A1C), BLOOD: Glyco Hgb (A1C): 6.8 % — ABNORMAL HIGH (ref 4.8–5.9)

## 2010-05-02 MED ORDER — DIAZEPAM 5 MG OR TABS
5.0000 mg | ORAL_TABLET | Freq: Two times a day (BID) | ORAL | Status: DC | PRN
Start: 2010-05-02 — End: 2010-05-28

## 2010-05-02 MED ORDER — CARISOPRODOL 350 MG OR TABS
350.0000 mg | ORAL_TABLET | ORAL | Status: DC
Start: 2010-05-02 — End: 2010-05-28

## 2010-05-02 NOTE — Patient Instructions (Signed)
 I have refilled your anxiety meds for the next month. Please follow up with Dr. Robin Searing in 1 month. Please go to weight management clinic with Dr. Elenora Gamma. Continue daily exercise and balanced diet, as you have been doing. Please get labs drawn, per orders.

## 2010-05-02 NOTE — Progress Notes (Signed)
 Chief Complaint   Patient presents with   . Other     f/u       SUBJECTIVE::Katherine Church is a 44 year old female with DM2, HTN, anxiety, here for follow up of medications for anxiety. This is a patient of PCP Dr. Robin Searing. Last seen by PCP 02/28/2010, at which time started tapering Valium given constipation. Since then, pt has requested refill of her Valium but was told to schedule an appointment first.     Today, biggest concern is losing weight. States has been exercising daily, and has been unable to lose weight. Was unable to get labs drawn. States has had increasing anxiety, agreed to go down on Valium and Soma during last visit to bid, and has noticed increasing chest tightness, hand shaking, choking sensation. Denies panic attacks, but endorses 10-12 episodes of anxiety over the last month. States has ran out of anxiety meds 3 days ago. Notices symptoms occur at nighttime, lying in bed, but no obvious triggers. Previously has tried SSRIs, but states made her feel "high." So discontinued that. No documented diagnosis of concurrent depression. States mood is 'fine.' Still with constipation. Last BM was today. May be leaving state 11/21, and is anxious about having continuity of care. Would like follow up appointment beginning of November.       Current outpatient prescriptions   Medication Sig   . traMADol (ULTRAM) 50 MG tablet Take 1 tablet by mouth every 8 hours as needed for Moderate Pain (Pain Score 4-6).   Marland Kitchen diazepam (VALIUM) 5 MG tablet Take 1 tablet by mouth every 12 hours as needed for Anxiety.   . carisoprodol (SOMA) 350 MG tablet Take 1 tablet by mouth As Directed.   . carvedilol (COREG) 12.5 MG tablet Take 1 tablet by mouth 2 times daily.   . simvastatin (ZOCOR) 10 MG tablet Take 1 tablet by mouth every evening.   . docusate sodium (COLACE) 100 MG capsule Take 2 capsules by mouth 2 times daily.   Marland Kitchen lisinopril (PRINIVIL, ZESTRIL) 40 MG tablet Take 1 tablet by mouth daily.   Marland Kitchen amLODIPINE  (NORVASC) 10 MG tablet Take 1 tablet by mouth daily.   . clonidine (CATAPRES) 0.3 MG tablet Take 1 tablet by mouth 3 times daily.       OBJECTIVE::   BP 139/105  Pulse 83  Temp(Src) 98.8 F (37.1 C) (Oral)  Resp 18  Ht 5\' 4"  (1.626 m)  Wt 78.563 kg (173 lb 3.2 oz)  BMI 29.73 kg/m2  SpO2 97%  LMP 04/15/2010  Repeat BP 130/95   NAD   Rrr, no m/r/g  CTAB  abd soft ntnd NABS  Trace edema   Mood 'fine', affect mood congruent     A/P::Katherine Church is a 44 year old female with DM2, HTN, anxiety, here for follow up of medications for anxiety.     #Anxiety: Persistent sx with multiple somatic complaints, ran out of meds 3 days ago. Has tried SSRI in past, but did not tolerate.   -Refilled Valium BID  -Refilled SOMA for back pain  -Pt to get labs drawn today, including TSH     #Weight gain: despite daily exercise and balanced diet  -Will refer to weight management clinic with Dr. Elenora Gamma   -Educated re daily exercise and diet   -Pt will need lipid panel, per orders    #DM2-repeat A1c ordered, cont limiting carbohydrates.     #HCM-screening mammogram ordered     Follow up with  Dr. Robin Searing in 1 month for routine follow up and refill of medications prior to leaving state.     Discussed with Dr. Ileene Rubens, who agrees with plan as above.

## 2010-05-02 NOTE — Progress Notes (Signed)
 Attending Note:  Chief Complaint   Patient presents with   . Other     f/u        Subjective:  I reviewed the history.  Patient interviewed and examined.   Katherine Church is a 44 year old female is here for Fu. She is here mainly given her need for Valium RF, for her anxiety, as well outlined by Dr. Arletta Bale.  She was seen last in July and at that time her Valium was being weaned, as well outlined by Dr. Arletta Bale.  She is attempting to lose wgt and this is her biggest concerned, as well outlined by Dr. Arletta Bale.  She has noted increased anxiety since wean of Valium and soma, as well outlined by Dr. Arletta Bale.  She was on an SSRI in past, but side effects, as well outlined by Dr. Arletta Bale.    Denies, CP, H/A, SOB, or other CHF Sx. No problems with  Medications, as well outlined by Dr. Arletta Bale. She is FB cardiology clinic.  Review of Systems - 12 step ROS reviewed, and negative except as noted in HPI and PMHx.    Past Medical, Family, Social History:  As per  the resident's  Note.  Patient Active Problem List   Diagnoses Date Noted   . Screening for breast cancer [V76.10K] 03/02/2010   . Screening for malignant neoplasm of breast [V76.29F] 09/14/2009   . Acute ischemic colitis [557.0B] 09/08/2009   . Abdominal pain [789.00AP] 09/02/2009   . Hypertension, malignant [401.0E] 05/22/2009   . Retinal hemorrhage [362.81] 05/22/2009   . Diabetes [250.00N] 05/18/2009   . Hypertension [401.9AH] 05/18/2009   . Chronic back pain [724.5AP] 05/18/2009   . Anxiety [300.00E] 05/18/2009       No past medical history on file.  Objective:   I have examined the patient and I concur with the resident's exam.  Recent labs and imaging as well as other data reviewed.    Assessment/Plan:    Assessment and plan reviewed with the resident physician.  I agree with the resident's plan as documented.    See the resident's note for further details.      Follow up as per resident.    Patient Instruction:  See Patient Education/Instruction  section.      Barriers to Learning assessed: none. All of the patients questions were answered to patient's satisfaction. Patient verbalizes understanding of teaching and instructions and is agreeable to above plan.

## 2010-05-22 ENCOUNTER — Other Ambulatory Visit (INDEPENDENT_AMBULATORY_CARE_PROVIDER_SITE_OTHER): Payer: Self-pay | Admitting: Neurology

## 2010-05-22 NOTE — Telephone Encounter (Signed)
 Verbally confirmed name of Primary Care Provider: yes    Last visit Date: 05/02/10    Medications:  1. traMADol (ULTRAM) 50 MG tablet     Name of Pharmacy Updated in Demographics: yes    Name and phone number of pharmacy if not in Epic Database:

## 2010-05-23 ENCOUNTER — Telehealth (INDEPENDENT_AMBULATORY_CARE_PROVIDER_SITE_OTHER): Payer: Self-pay

## 2010-05-23 NOTE — Telephone Encounter (Signed)
 Pt calling to request status on refill below. Reminded pt of 2-3 bus day refill protocol. Pt states pharmacy has faxed refill request since 10/10 with no response. Pt states this is a continuing problem and would like issue resolved. Pt asked for office manager name.  Fwd to [employer]

## 2010-05-23 NOTE — Telephone Encounter (Signed)
 Pended refill(s) and routing to MD for review and approval

## 2010-05-24 ENCOUNTER — Other Ambulatory Visit (INDEPENDENT_AMBULATORY_CARE_PROVIDER_SITE_OTHER): Payer: Self-pay

## 2010-05-24 MED ORDER — TRAMADOL HCL 50 MG OR TABS
50.0000 mg | ORAL_TABLET | Freq: Three times a day (TID) | ORAL | Status: DC | PRN
Start: 2010-05-24 — End: 2010-06-13

## 2010-05-24 MED ORDER — TRAMADOL HCL 50 MG OR TABS
50.0000 mg | ORAL_TABLET | Freq: Three times a day (TID) | ORAL | Status: DC | PRN
Start: 2010-05-23 — End: 2010-06-13

## 2010-05-24 NOTE — Telephone Encounter (Signed)
 Received refill request: time sensitive refill routing to Dr. Lonzo Cloud for review.   Last Visit: 05/02/10 Dr. Arletta Bale  New Visit: none  No Shows: none  Last refilled: 04/23/10 disp.#60x0 refills  Recent ED visits: none

## 2010-05-24 NOTE — Telephone Encounter (Signed)
 Patient states that [facility] has been faxing refill request for Tramadol since Monday that is what pharmacy told her. I apologized to patient and explained that we received request on the 12 which was Wednesday.     She is well aware of the refill protocol but would like to know if an except can be made and medication be refill today. I explained that I would forward to provider but could not promise anything. Patient appreciated help and said that she will call us next time she needs a refill prior to calling her pharmacy to avoid issues of this kind. Thank you    Forwarding to Dr. Robin Searing and nurse Felipa Furnace

## 2010-05-24 NOTE — Telephone Encounter (Signed)
 Opened by error.

## 2010-05-28 ENCOUNTER — Other Ambulatory Visit (INDEPENDENT_AMBULATORY_CARE_PROVIDER_SITE_OTHER): Payer: Self-pay | Admitting: Neurology

## 2010-05-28 NOTE — Telephone Encounter (Signed)
 Verbally confirmed name of Primary Care Provider: yes    Last visit Date: 05/02/10    Medications:  1. carisoprodol (SOMA) 350 MG tablet   2. diazepam (VALIUM) 5 MG tablet     Name of Pharmacy Updated in Demographics: yes    Name and phone number of pharmacy if not in Epic Database:

## 2010-05-28 NOTE — Telephone Encounter (Signed)
 Pended and Routed to Dr. Robin Searing for approval.

## 2010-05-31 NOTE — Telephone Encounter (Signed)
 Pt calling requesting status on medication refills below. Pt states she will be out of diazepam tomorrow and can't be without. Thank you.

## 2010-06-03 MED ORDER — DIAZEPAM 5 MG OR TABS
5.0000 mg | ORAL_TABLET | Freq: Two times a day (BID) | ORAL | Status: DC | PRN
Start: 2010-05-28 — End: 2010-06-26

## 2010-06-03 MED ORDER — CARISOPRODOL 350 MG OR TABS
350.0000 mg | ORAL_TABLET | ORAL | Status: DC
Start: 2010-05-28 — End: 2010-06-26

## 2010-06-03 NOTE — Telephone Encounter (Signed)
Routing to Dr. Millen for assistance.

## 2010-06-03 NOTE — Telephone Encounter (Signed)
 She is also overdue for her 1 month follow-up to review these meds as they were being tapered down with Dr. Robin Searing.  Refilled for now to give her time to make appt.

## 2010-06-03 NOTE — Telephone Encounter (Signed)
 Pt 2nd call requesting status on refill below. Pt states if she does not take this medication it interferes with her BP.

## 2010-06-03 NOTE — Telephone Encounter (Signed)
 Pt calling back and checking status on the rx below. Pt requesting rx called in today. Please call the pt and let her know the status.

## 2010-06-04 NOTE — Telephone Encounter (Signed)
 Spoke to the pt and she is coming in 11/3 at 1:40 with PCP.

## 2010-06-04 NOTE — Telephone Encounter (Signed)
 Katherine Church could you call this pt. And offer f/u appt. Per Dr. Lonzo Cloud. ASAP   Pt. Is overdue for medication review w/ M.D. Dr. Robin Searing or other Resident.  Dr. Lonzo Cloud will only refill this time but pt. Will need to be seen before any further refills.  Thanks.

## 2010-06-13 ENCOUNTER — Telehealth (INDEPENDENT_AMBULATORY_CARE_PROVIDER_SITE_OTHER): Payer: Self-pay | Admitting: Neurology

## 2010-06-13 ENCOUNTER — Ambulatory Visit (INDEPENDENT_AMBULATORY_CARE_PROVIDER_SITE_OTHER): Admitting: Neurology

## 2010-06-13 ENCOUNTER — Encounter (INDEPENDENT_AMBULATORY_CARE_PROVIDER_SITE_OTHER): Payer: Self-pay | Admitting: Neurology

## 2010-06-13 VITALS — BP 147/99 | HR 85 | Temp 98.8°F | Resp 18 | Ht 64.0 in | Wt 175.0 lb

## 2010-06-13 MED ORDER — GLIPIZIDE 2.5 MG OR TB24
2.5000 mg | ORAL_TABLET | Freq: Every day | ORAL | Status: DC
Start: 2010-06-13 — End: 2010-12-02

## 2010-06-13 MED ORDER — TRAMADOL HCL 50 MG OR TABS
50.0000 mg | ORAL_TABLET | Freq: Three times a day (TID) | ORAL | Status: DC | PRN
Start: 2010-06-13 — End: 2010-06-26

## 2010-06-13 MED ORDER — LISINOPRIL 40 MG OR TABS
80.0000 mg | ORAL_TABLET | Freq: Every day | ORAL | Status: DC
Start: 2010-06-13 — End: 2010-06-26

## 2010-06-13 MED ORDER — GABAPENTIN (ONCE-DAILY) 300 MG PO TABS
300.0000 mg | ORAL_TABLET | Freq: Every evening | ORAL | Status: DC | PRN
Start: 2010-06-13 — End: 2010-12-02

## 2010-06-13 NOTE — Progress Notes (Signed)
 ATTENDING NOTE:  SUBJECTIVE:   Chief Complaint   Patient presents with   . Other     medication review, R foot pain r/t ulcer     I reviewed the history.  Patient interviewed and examined.    History of present illness (HPI):    Katherine Church is a 44 year old female who presents with f/up MMP  Review of Systems (ROS): 10 point review of system negative or non contributory except for issues discussed in HPI as per  the resident's note.  Past Medical, Family, Social History:  As per  the resident's  Note.    Current Outpatient Prescriptions   Medication Sig   . diazepam (VALIUM) 5 MG tablet Take 1 tablet by mouth every 12 hours as needed for Anxiety.   . carisoprodol (SOMA) 350 MG tablet Take 1 tablet by mouth As Directed. daily   . traMADol (ULTRAM) 50 MG tablet Take 1 tablet by mouth every 8 hours as needed for Moderate Pain (Pain Score 4-6).   . DISCONTD: traMADol (ULTRAM) 50 MG tablet Take 1 tablet by mouth every 8 hours as needed for Moderate Pain (Pain Score 4-6).   . carvedilol (COREG) 12.5 MG tablet Take 1 tablet by mouth 2 times daily.   . simvastatin (ZOCOR) 10 MG tablet Take 1 tablet by mouth every evening.   . docusate sodium (COLACE) 100 MG capsule Take 2 capsules by mouth 2 times daily.   Marland Kitchen lisinopril (PRINIVIL, ZESTRIL) 40 MG tablet Take 1 tablet by mouth daily.   Marland Kitchen amLODIPINE (NORVASC) 10 MG tablet Take 1 tablet by mouth daily.   . clonidine (CATAPRES) 0.3 MG tablet Take 1 tablet by mouth 3 times daily.     OBJECTIVE:   BP 147/99  Pulse 85  Temp(Src) 98.8 F (37.1 C) (Oral)  Resp 18  Ht 5\' 4"  (1.626 m)  Wt 79.379 kg (175 lb)  BMI 30.04 kg/m2 Body mass index is 30.04 kg/(m^2).  Gen: AAO x 3, NAD  Neck: supple, no JVD,   Lungs: CTAB, no wheeze,   CV: 1/6 SM, S1 S2, RRR,   Abd: Benign and soft, NTND,   Ext: warm, no pedal edema B/L  Psych: flat Affect   I have examined the patient and I concur with the resident's exam.    Lab Results   Component Value Date    A1C 6.8 05/02/2010    A1C 6.7  05/18/2009     Lab Results   Component Value Date    CHOL 216 05/19/2009    HDL 38 05/19/2009    LDLCALC 116 05/19/2009    TRIG 310 05/19/2009       2010  ECHO   Conclusions:  1) Normal left ventricular size and systolic function.  2) Mild concentric LV hypertrophy.  3) Mildly enlarged left atrium.  4) Mild LV diastolic dysfunction suggesting normal or low left atrial  pressure.  5) Mild mitral regurgitation.  There is trace aortic regurgitation. There is mild mitral and tricuspid regurgitation.    OTHER MEDICAL RECORDS/TESTES/IMAGING STUDIES REVIEWED:  I have reviewed the most recent lab and imaging data and with the Pt.    I have review/summarized old Medical Records:      MEDICAL ASSESSMENT and PLAN of CARE:   HTN.  Not at goal.  Double her ACEI  DM2. Not at goal and did not tolerate Metformin.  Start Glipizide   Anxiety.  Referral to psych.      1. Hypertension (  401.9)  LDL CHOLESTEROL (DIRECT), BLOOD, ELECTROCARDIOGRAM, COMPLETE, DISCONTINUED: lisinopril (PRINIVIL, ZESTRIL) 40 MG tablet   2. Screening for breast cancer (V76.10)  MAMMOGRAM, SCREENING   3. Anxiety (300.00)     4. Diabetes (250.00)  glipiZIDE (GLUCOTROL XL) 2.5 MG CR tablet, CONSULT/REFERRAL TO PODIATRY, CONSULT/REFERRAL TO OPHTHALMOLOGY   5. Pain (780.96)  Gabapentin, PHN, 300 MG TABS, DISCONTINUED: traMADol (ULTRAM) 50 MG tablet   6. 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 Morton's neuroma (355.6)  CONSULT/REFERRAL TO PODIATRY     Co-morbid medical problem was taken into account as part of the thought process of making a medical decision regarding the active/acute issue(s).  Assessment and plan reviewed with the resident physician.  I agree with the resident's plan as documented.  See the resident's note for further details.

## 2010-06-13 NOTE — Patient Instructions (Addendum)
 1. [facility]  [address] [place], [place] [address] [phone]     2. Bring a log of blood pressures and blood sugars to your visit (approximately 11/18)    3. Do not take advil/ipuprofen/aspirin for pain. Take tylenol if it works, take up to 3 tramadol, call us if your pain is still not well controlled. Continue warm compresses for back muscle pain.    4. Extra cushion for shoe under ball of foot should help Morton's Neuroma.  Make appointment with podiatry, too.    5. You may come to the lab at any time to complete the fasting labs. Call our office if the lab needs a new order.

## 2010-06-13 NOTE — Progress Notes (Signed)
 Chief Complaint   Patient presents with   . Other     medication review, R foot pain r/t ulcer     SUBJECTIVE::Katherine Church is a 44 year old female here for follow up of anxiety meds and for new foot pain.    #Anxiety: described as "becoming more emotional". This symptom became prominent when we decreased her dose of valium, and became much worse in the past few weeks when she learned that her visit to home [facility]) would have to include staying in the home where she found her father deceased in 10-08-05. She was on other medications from 2007-8 prescribed by her PCP after the incident with her father, but states she had side effects such as heart racing. She is happy that valium works well for her. Occasionally includes throat tightening sensation. No palpitations/CP associated with these events. Has never seen psychiatrist, including after last visit when this was recommended. Sleeps 5-6 hr 2-3x/week, otherwise less. Sleeps at same time each night (7pm in bed, 9 pm sleep after reading, boyfriend wakes up at 2:30 for work on [employer]. States when she first moved to [facility] earlier this year, she was having "attacks" of anxiety such that she was unable to leave her home.    #Medication: has been taking valium 5mg  Q12, 2-3 tabs soma/day (as rx ran out for valium, as she was supposed to schedule appointment to readjust), tramadol 1 BID, 2 tab Advil BID (despite h/o gastric and lower intestinal ulcers).    #Foot pain: localized fluctuant nodule appeared with burning on plantar surface between 3rd and 4th / under 4th metatarsal. Has been inhibiting her ability to walk 2-44miles 5-6x/week - which she states helps both her anxiety and her glucose control (FSG in a.m. 170 is 111 after walking). The burning in this localized spot is different from the numbness that extends from her back down the back of the leg.     #Muscle spasm/cramp L side of chest: occasional, lasts few minutes, no diaphoresis. A.m. Blood  pressures 140/90. Past echo: mild LV hypertrophy, mild MR, enlarged LA in 05/2009. 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 Known heart murmur. Has been admitted before for hypertensive emergency with loss of central L eye vision.    #Pain: low back 5/10, foot 5/10, upper back/neck paraspinous 3/10.    #Diabetes: was on metformin in past, yielded nausea. Was offered other meds but were cost prohibitive. FSG a.m. 150-175.  #Insurance: now more funds available through boyfriend's work    Please see last note in Epic,   Patient Active Problem List   Diagnoses Date Noted   . Screening for breast cancer 03/02/2010   . Screening for malignant neoplasm of breast 09/14/2009   . Acute ischemic colitis 09/08/2009   . Abdominal pain 09/02/2009   . Hypertension, malignant 05/22/2009   . Retinal hemorrhage 05/22/2009   . Diabetes 05/18/2009   . Hypertension 05/18/2009   . Chronic back pain 05/18/2009   . Anxiety 05/18/2009     Social History:  History   Substance Use Topics   . Smoking status: Passive Smoker     Types: Cigarettes   . Smokeless tobacco: Never Used   . Alcohol Use: No     Allergies:  Allergies   Allergen Reactions   . Tetanus Toxoids Swelling     Current Outpatient Prescriptions   Medication Sig   . diazepam (VALIUM) 5 MG tablet Take 1 tablet by mouth every 12 hours as needed for  Anxiety.   . carisoprodol (SOMA) 350 MG tablet Take 1 tablet by mouth As Directed. daily   . traMADol (ULTRAM) 50 MG tablet Take 1 tablet by mouth every 8 hours as needed for Moderate Pain (Pain Score 4-6).   . DISCONTD: traMADol (ULTRAM) 50 MG tablet Take 1 tablet by mouth every 8 hours as needed for Moderate Pain (Pain Score 4-6).   . carvedilol (COREG) 12.5 MG tablet Take 1 tablet by mouth 2 times daily.   . simvastatin (ZOCOR) 10 MG tablet Take 1 tablet by mouth every evening.   . docusate sodium (COLACE) 100 MG capsule Take 2 capsules by mouth 2 times daily.   Marland Kitchen lisinopril (PRINIVIL, ZESTRIL) 40 MG tablet Take 1 tablet by mouth daily.   Marland Kitchen amLODIPINE  (NORVASC) 10 MG tablet Take 1 tablet by mouth daily.   . clonidine (CATAPRES) 0.3 MG tablet Take 1 tablet by mouth 3 times daily.     ROS:12 point review of systems, reviewed and negative except as per HPI and PMHx.   OBJECTIVE:: BP 147/99  Pulse 85  Temp(Src) 98.8 F (37.1 C) (Oral)  Resp 18  Ht 5\' 4"  (1.626 m)  Wt 79.379 kg (175 lb)  BMI 30.04 kg/m2  General Appearance: healthy, alert, no distress, pleasant affect, cooperative.  Eyes: conjunctivae and corneas clear.  Neck: Neck supple.  no carotid bruits.  Heart: normal rate and regular rhythm, early systolic and diastolic murmurs.  Lungs: clear to auscultation and percussion, no chest deformities noted.  Abdomen: BS normal. Abdomen soft, non-tender. No masses or organomegaly.  Extremities: no cyanosis, clubbing, or edema. TTP under 4th proximal phalynx and 3rd-4th intraphalynx plantar surface  Back: SI joints, paraspinous muscles, spine non tender    Recent labs, imaging and chart reviewed.   A/P:  Lamis was seen today for anxiety, foot pain and follow-up for HTN and DM.    Diagnoses and associated orders for this visit:    Hypertension - suboptimal control. Goal 120/80. Instructed to bring recordings to next visit.  - LDL CHOLESTEROL (DIRECT), BLOOD  - lisinopril (PRINIVIL, ZESTRIL) 40 MG tablet; Take 2 tablets by mouth daily (added to norvasc 10)  - EKG & Interpretation    Screening for breast cancer  - Screening mammogram    Anxiety: continue same dose of valium for now. Instructed to not take extra soma.   - Would benefit from psychiatric evaluation for likely combination of PTSD/anxiety, partly situational. Still working on insurance coverage; will have patient see CMS for now. Gave address and phone. I will call to see if she was able to follow through this time.    Diabetes: formerly on single agent, now on no meds.   - glipiZIDE (GLUCOTROL XL) 2.5 MG CR tablet; Take 1 tablet by mouth daily.  - Consult/Referral to Podiatry  - Ophthalmology  Clinic    Pain: instructed to not take advil/ibuprofen. Instructed she is able to take tramadol Q8, as thought it was less frequent. Added neurontin for sciatica, but her sx are more numbness than sciatica.  - traMADol (ULTRAM) 50 MG tablet; Take 1 tablet by mouth every 8 hours as needed for Moderate Pain (Pain Score 4-6).  - Gabapentin, PHN, 300 MG TABS; Take 300 mg by mouth At bedtime as needed (burning pain outside of foot).    Vision loss of left eye  - Ophthalmology Clinic    Morton's neuroma  - Consult/Referral to Podiatry  - instructed patient to add cushion to show  Follow-up by 11/18 prior to trip to [place].  Patient Instruction:  See Patient Education/Instruction section.   Patient was educated on clinical, laboratory and imaging findings.   Barriers to Learning assessed: none. All of the patients questions were answered to patient's satisfaction. Patient verbalizes understanding of teaching

## 2010-06-13 NOTE — Telephone Encounter (Signed)
 Trey Paula with [facility] is calling to discuss Gabapentin, PHN, 300 MG TABS.  Trey Paula states they don't carry tabs, can this be changed to caps. Please call Trey Paula @ [phone]

## 2010-06-14 ENCOUNTER — Telehealth (HOSPITAL_COMMUNITY): Payer: Self-pay | Admitting: Internal Medicine

## 2010-06-14 ENCOUNTER — Other Ambulatory Visit (HOSPITAL_BASED_OUTPATIENT_CLINIC_OR_DEPARTMENT_OTHER): Payer: Self-pay

## 2010-06-14 MED ORDER — CLONIDINE HCL 0.3 MG OR TABS
0.3000 mg | ORAL_TABLET | Freq: Three times a day (TID) | ORAL | Status: DC
Start: 2010-06-14 — End: 2011-01-21

## 2010-06-14 NOTE — Telephone Encounter (Signed)
 Patient out of  clonidine (CATAPRES) 0.3 MG tablet        please send to [facility] #[id number] -- [address] -- [place] -- [place] -- [address] -- [phone] -- [phone]  Please send 180 pills with refills, patient has already been given 10 loaner pills. Pharmacy has been faxing request, when sent please route to front desk

## 2010-06-14 NOTE — Telephone Encounter (Signed)
 Pt is calling to to check status on medication below. Pharmacy informed her that  md or nurse has not contacted pharmacy yet.

## 2010-06-14 NOTE — Telephone Encounter (Signed)
 Refill request for clonidine. Pt last seen 3/11 and BMP 1/11. Routed to [employer]

## 2010-06-17 NOTE — Telephone Encounter (Signed)
 Called [facility] [phone] advised O.K. To dispense Caps instead of tabs.  Advised to call pt. When ready for p/u.  Closing.

## 2010-06-17 NOTE — Telephone Encounter (Signed)
 Trey Paula with [facility] is calling  Re: msg below. Trey Paula would like to know if caps can be dispensed intead of tabs. Please call [phone]

## 2010-06-26 ENCOUNTER — Encounter (INDEPENDENT_AMBULATORY_CARE_PROVIDER_SITE_OTHER): Payer: Self-pay | Admitting: Internal Medicine

## 2010-06-26 ENCOUNTER — Ambulatory Visit (INDEPENDENT_AMBULATORY_CARE_PROVIDER_SITE_OTHER): Admitting: Internal Medicine

## 2010-06-26 VITALS — BP 142/108 | HR 82 | Temp 99.1°F | Resp 16 | Ht 64.0 in | Wt 167.0 lb

## 2010-06-26 MED ORDER — DIAZEPAM 5 MG OR TABS
5.0000 mg | ORAL_TABLET | Freq: Three times a day (TID) | ORAL | Status: AC | PRN
Start: 2010-06-26 — End: 2010-07-26

## 2010-06-26 MED ORDER — TRAMADOL HCL 50 MG OR TABS
50.0000 mg | ORAL_TABLET | Freq: Three times a day (TID) | ORAL | Status: DC | PRN
Start: 2010-06-26 — End: 2010-08-08

## 2010-06-26 MED ORDER — DOCUSATE SODIUM 100 MG OR CAPS
200.0000 mg | ORAL_CAPSULE | Freq: Two times a day (BID) | ORAL | Status: DC
Start: 2010-06-26 — End: 2011-05-06

## 2010-06-26 MED ORDER — CARVEDILOL 25 MG OR TABS
25.0000 mg | ORAL_TABLET | Freq: Two times a day (BID) | ORAL | Status: DC
Start: 2010-06-26 — End: 2010-08-19

## 2010-06-26 MED ORDER — PIOGLITAZONE HCL 15 MG OR TABS
15.0000 mg | ORAL_TABLET | Freq: Every day | ORAL | Status: DC
Start: 2010-06-26 — End: 2010-11-28

## 2010-06-26 MED ORDER — CARISOPRODOL 350 MG OR TABS
350.0000 mg | ORAL_TABLET | ORAL | Status: DC
Start: 2010-06-26 — End: 2010-08-08

## 2010-06-26 MED ORDER — LISINOPRIL 40 MG OR TABS
40.0000 mg | ORAL_TABLET | Freq: Every day | ORAL | Status: DC
Start: 2010-06-26 — End: 2010-12-26

## 2010-06-26 NOTE — Patient Instructions (Addendum)
Increase and take Coreg 25 mg twice daily with food (can take two tablets twice a day of remaining pills)    NEW Diabetes Medications: actos (pioglitazone once a day)

## 2010-06-26 NOTE — Progress Notes (Signed)
History of Present Illness  44 year old female here today for follow up of multiple complaints:    * Sciatica: chronic issue, prescribed gabapentin last visit, could not tolerate 2/2 GI upset; pain improving since last visit  * DM: could not tolerate glipizide as prescribed last visit (nausea/ vomiting); random FSG 110-229  * Anxiety: she is requesting her Valium be increased back to TID (recently decreased to BID), as patient is extremely anxious re: visiting [place] next      week and staying in the home where she found her father decreased  * HTN: did not double lisinopril as recommended last visit    Exam  BP 142/108  Pulse 82  Temp(Src) 99.1 F (37.3 C) (Oral)  Resp 16  Ht 5\' 4"  (1.626 m)  Wt 75.751 kg (167 lb)  BMI 28.67 kg/m2  SpO2 98%  LMP 06/05/2010  General: pleasant female in no acute distress, but with liability of mood, particularly when discussing upcoming trip back home   HEENT: OP clear without erythema  CV: regular, 1/6 systolic murmur  Pulm: clear bilaterally  Abd: benign  Extr: no edema, + straight leg on right    ASSESSMENT AND PLAN    Hypertension: above goal today; patient did not increase lisinopril as recommended last visit, but she does report compliance with all other medications, including clonidine; will double coreg as this will likely have a larger effect on BP  - Increase carvedilol to 25 MG tablet; Take 1 tablet by mouth 2 times daily.  -     Continue lisinopril 40 daily, norvasc 10 daily, and clonidine 0.3 TID    Diabetes: unfortunately, as with many medications, patient states she cannot tolerate the sulfonylurea recently prescribed; she previously could not tolerate metformin as well, so will try monotherapy with actos given her relatively mild DM (A1C 6.8)  - pioglitazone (ACTOS) 15 MG tablet; Take 1 tablet by mouth daily.    Anxiety: will increased valium to TID for this month only at the patient's request, especially given her clear anxiety over returning home to [place]  next weekend. Patient is aware that benzodiazapines are not a long term solution, and she plans to make a psychiatry appointment in December when she returns back to [facility]  - diazepam (VALIUM) 5 MG tablet; Take 1 tablet by mouth every 8 hours as needed for Anxiety. (#90)    Pain: chronic back, sciatic pain; patient unable to take NSAIDs given prior history of ischemic colitis that nearly required a colectomy  - traMADol (ULTRAM) 50 MG tablet; Take 1 tablet by mouth every 8 hours as needed   -     carisoprodol (SOMA) 350 MG tablet; Take 1 tablet by mouth As Directed. Daily      Patient discussed with Dr Elenora Gamma, attending physician

## 2010-07-07 NOTE — Progress Notes (Signed)
ATTENDING NOTE:  SUBJECTIVE:   Chief Complaint   Patient presents with   . Routine Follow Up     2 week     I reviewed the history.  Patient interviewed and examined.    History of present illness (HPI):    Katherine Church is a 44 year old female who presents with f/up   Review of Systems (ROS): 10 point review of system negative or non contributory except for issues discussed in HPI as per  the resident's note.  Past Medical, Family, Social History:  As per  the resident's  Note.    Current Outpatient Prescriptions   Medication Sig   . lisinopril (PRINIVIL, ZESTRIL) 40 MG tablet Take 1 tablet by mouth daily.   . diazepam (VALIUM) 5 MG tablet Take 1 tablet by mouth every 8 hours as needed for Anxiety.   . carvedilol (COREG) 25 MG tablet Take 1 tablet by mouth 2 times daily.   . pioglitazone (ACTOS) 15 MG tablet Take 1 tablet by mouth daily.   . traMADol (ULTRAM) 50 MG tablet Take 1 tablet by mouth every 8 hours as needed for Moderate Pain (Pain Score 4-6).   . carisoprodol (SOMA) 350 MG tablet Take 1 tablet by mouth As Directed. daily   . docusate sodium (COLACE) 100 MG capsule Take 2 capsules by mouth 2 times daily.   . clonidine (CATAPRES) 0.3 MG tablet Take 1 tablet by mouth 3 times daily.   Marland Kitchen glipiZIDE (GLUCOTROL XL) 2.5 MG CR tablet Take 1 tablet by mouth daily.   . Gabapentin, PHN, 300 MG TABS Take 300 mg by mouth At bedtime as needed (burning pain outside of foot).   . simvastatin (ZOCOR) 10 MG tablet Take 1 tablet by mouth every evening.   Marland Kitchen amLODIPINE (NORVASC) 10 MG tablet Take 1 tablet by mouth daily.     OBJECTIVE:   BP 142/108  Pulse 82  Temp(Src) 99.1 F (37.3 C) (Oral)  Resp 16  Ht 5\' 4"  (1.626 m)  Wt 75.751 kg (167 lb)  BMI 28.67 kg/m2  SpO2 98%  LMP 06/05/2010 Body mass index is 28.67 kg/(m^2).  Gen: AAO x 3, NAD  Psych: Affect positive, mood good  I have examined the patient and I concur with the resident's exam.      OTHER MEDICAL RECORDS/TESTES/IMAGING STUDIES REVIEWED:  I have  reviewed the most recent lab and imaging data and with the Pt.    I have review/summarized old Medical Records:     MEDICAL ASSESSMENT and PLAN of CARE:   HTN. Not at goal and adjust meds.    1. Hypertension (401.9)  lisinopril (PRINIVIL, ZESTRIL) 40 MG tablet, carvedilol (COREG) 25 MG tablet   2. Diabetes (250.00)  pioglitazone (ACTOS) 15 MG tablet   3. Anxiety (300.00)  diazepam (VALIUM) 5 MG tablet   4. Pain (780.96)  traMADol (ULTRAM) 50 MG tablet   5. Back pain (724.5)  carisoprodol (SOMA) 350 MG tablet   6. Abdominal pain (789.00)  carisoprodol (SOMA) 350 MG tablet   7. Acute ischemic colitis (557.0)  docusate sodium (COLACE) 100 MG capsule      Co-morbid medical problem was taken into account as part of the thought process of making a medical decision regarding the active/acute issue(s).  Assessment and plan reviewed with the resident physician.  I agree with the resident's plan as documented.  See the resident's note for further details.

## 2010-07-30 ENCOUNTER — Telehealth (INDEPENDENT_AMBULATORY_CARE_PROVIDER_SITE_OTHER): Payer: Self-pay | Admitting: Neurology

## 2010-07-30 NOTE — Telephone Encounter (Signed)
noted

## 2010-07-30 NOTE — Telephone Encounter (Signed)
Pt requesting to come in ASAP in regards to her stomach. Next available with PCP is Feb.

## 2010-07-30 NOTE — Telephone Encounter (Signed)
Forwarding to front desk to have pt schedule with any other provider if urgent.

## 2010-07-30 NOTE — Telephone Encounter (Signed)
Pt was scheduled with Dr Robin Searing on Mon 1.09.12 @ 2:30pm. Pt is out of town until 12.28. She was advised if she feels like she needs to be seen sooner to call us and we can schedule her with a another MD.

## 2010-08-08 ENCOUNTER — Other Ambulatory Visit (INDEPENDENT_AMBULATORY_CARE_PROVIDER_SITE_OTHER): Payer: Self-pay | Admitting: Neurology

## 2010-08-08 NOTE — Telephone Encounter (Signed)
Verbally confirmed name of Primary Care Provider: yes    Last visit Date: 06/26/2010    Medications:  carisoprodol (SOMA) 350 MG tablet   traMADol (ULTRAM) 50 MG tablet      Name of Pharmacy Updated in Demographics: yes    Name and phone number of pharmacy if not in Epic Database:

## 2010-08-08 NOTE — Telephone Encounter (Signed)
Pended refill(s) and routing to MD for review and approval

## 2010-08-09 MED ORDER — CARISOPRODOL 350 MG OR TABS
350.0000 mg | ORAL_TABLET | ORAL | Status: DC
Start: 2010-08-08 — End: 2010-08-19

## 2010-08-09 MED ORDER — TRAMADOL HCL 50 MG OR TABS
50.0000 mg | ORAL_TABLET | Freq: Three times a day (TID) | ORAL | Status: DC | PRN
Start: 2010-08-08 — End: 2010-09-06

## 2010-08-19 ENCOUNTER — Ambulatory Visit (INDEPENDENT_AMBULATORY_CARE_PROVIDER_SITE_OTHER): Admitting: Neurology

## 2010-08-19 VITALS — BP 176/112 | HR 80 | Temp 98.6°F | Resp 14 | Ht 64.0 in | Wt 167.0 lb

## 2010-08-19 MED ORDER — METOPROLOL TARTRATE 50 MG OR TABS
50.0000 mg | ORAL_TABLET | Freq: Two times a day (BID) | ORAL | Status: DC
Start: 2010-08-19 — End: 2011-03-06

## 2010-08-19 MED ORDER — CARISOPRODOL 350 MG OR TABS
350.0000 mg | ORAL_TABLET | Freq: Every evening | ORAL | Status: DC | PRN
Start: 2010-08-19 — End: 2010-10-11

## 2010-08-19 NOTE — Progress Notes (Signed)
Chief Complaint   Patient presents with   . Musculoskeletal Problem     right hip pain s/p running  since last Thanksgiving stiffens up periodic    . Hypertension     SUBJECTIVE::Katherine Church is a 45 year old female here for new lateral "hip" pain.  Ran a 5k right after Thanksgiving, later that week heard pop when walking and increased pain in lateral hip. Improving, but not completely resolved. Heat helps. Still has sciatic sx with R leg going numb. Soma 2-3x/day. Tramadol 4-5x/day.  States home BPs approx 130s/97-102. Blood sugars 140s-150s, not taking any meds since actos was too expensive and metformin makes nauseous. Did not think she was supposed to be on glipizide. Thinks she may be taking 50 coreg BID instead of 25 (but only taking 1 pill BID, so most likely 25 as prescribed).    Psychologically, she has been doing well.  She has not yet seen a psychiatrist, but is interested in doing so. Off valium for now (was requiring for anxiety before).  She remains positive and optimistic during the interview.  However, as on prior visits, becomes tearful when the attending arrives, and states how difficult it has been to not be as mobile due to the pain (staying in apartment when she is used to being out of the house more).  As with prior visits, after attending has left the room, states that she has only 7 pills of soma left (last filled on 08/09/10 - #30) and requests if there is anything that can get her through the short-term pain.     Please see last note in Epic,   Patient Active Problem List   Diagnoses Date Noted   . Screening for breast cancer 03/02/2010   . Screening for malignant neoplasm of breast 09/14/2009   . Acute ischemic colitis 09/08/2009   . Abdominal pain 09/02/2009   . Hypertension, malignant 05/22/2009   . Retinal hemorrhage 05/22/2009   . Diabetes 05/18/2009   . Hypertension 05/18/2009   . Chronic back pain 05/18/2009   . Anxiety 05/18/2009     Social History:  History   Substance Use  Topics   . Smoking status: Passive Smoker     Types: Cigarettes   . Smokeless tobacco: Never Used   . Alcohol Use: No     Allergies:  Allergies   Allergen Reactions   . Tetanus Toxoids Swelling     Current Outpatient Prescriptions   Medication Sig   . carisoprodol (SOMA) 350 MG tablet Take 1 tablet by mouth As Directed. daily   . traMADol (ULTRAM) 50 MG tablet Take 1 tablet by mouth every 8 hours as needed for Moderate Pain (Pain Score 4-6).   Marland Kitchen lisinopril (PRINIVIL, ZESTRIL) 40 MG tablet Take 1 tablet by mouth daily.   . carvedilol (COREG) 25 MG tablet Take 1 tablet by mouth 2 times daily.   . pioglitazone (ACTOS) 15 MG tablet Take 1 tablet by mouth daily.   Marland Kitchen docusate sodium (COLACE) 100 MG capsule Take 2 capsules by mouth 2 times daily.   . clonidine (CATAPRES) 0.3 MG tablet Take 1 tablet by mouth 3 times daily.   Marland Kitchen glipiZIDE (GLUCOTROL XL) 2.5 MG CR tablet Take 1 tablet by mouth daily.   . Gabapentin, PHN, 300 MG TABS Take 300 mg by mouth At bedtime as needed (burning pain outside of foot).   . simvastatin (ZOCOR) 10 MG tablet Take 1 tablet by mouth every evening.   Marland Kitchen amLODIPINE (  NORVASC) 10 MG tablet Take 1 tablet by mouth daily.     ROS:12 point review of systems, reviewed and negative except as per HPI and PMHx.   OBJECTIVE:: BP 176/112  Pulse 80  Temp(Src) 98.6 F (37 C) (Oral)  Resp 14  Ht 5\' 4"  (1.626 m)  Wt 75.751 kg (167 lb)  BMI 28.67 kg/m2  SpO2 97%  General Appearance: healthy, alert, no distress, pleasant but anxious affect, cooperative.  Eyes: conjunctivae and corneas clear.  Heart: normal rate and regular rhythm, no murmurs, clicks, or gallops.  Lungs: clear to auscultation and percussion, no chest deformities noted.  Extremities: no cyanosis, clubbing, or edema. Point tenderness on lateral proximal head of femur. No apprehension with internal/external rotation of hip. No joint laxity.    Recent labs, imaging and chart reviewed.    A/P:  Morgane was seen today for musculoskeletal problem  and hypertension.    Diagnoses and associated orders for this visit:    Back pain: Patient clearly exhibiting behaviors of addiction and pain medication seeking with regards to soma. Has been instructed to taper multiple times, and continues to request for "short term use" only at the conclusion of each visit, followed by repeat phone call requests. She has been previously been instructed to use tramadol PRN, and to taper to BID PRN soma (from the 3-4x/day she was taking).  Today, she should taper to QHS PRN (as states most of difficulty with the pain is when it prevents sleep). She should not be given further prescriptions of soma. Dr. Foye Clock at last appointment informed patient this was an addictive medication and was not to be used long term. Patient affirmed understanding of this at this visit. Refilled 5 tabs soma to end her taper (12 days total QHS PRN), after which will not refill any further. We will need to find alternate superior methods to address her pain (this was a medication she came to [place] on from outside clinic).  - Pain Clinic  - carisoprodol (SOMA) 350 MG tablet; completing taper off with 1 tablet by mouth QHS PRN for Muscle Spasms x 12 days then stop  -  Tramadol PRN    Htn (hypertension): Has been very difficult to control despite multiple medications. TSH, metanephrines tested normal in past year. Has strong family history of serious cardiovascular disease, thus we would like to do all we can to optimize her BP and other risk factors. Coreg 25 BID initiated at last visit, will change to metop 50 BID for higher potency (she is not well beta blocked based on pulse rate of 70s-80s). Pt states she has been compliant with all medications.   - metoprolol tartrate (LOPRESSOR) 50 MG tablet; Take 1 tablet by mouth 2 times daily.  -  Continue clonidine 0.3 TID, lisinopril 40mg  daily, amlodipine 10mg  daily  - If still suboptimal control at next visit, would change amlodipine to nifedipine  - Should  bring BP cuff to calibrate at next visit  - Comprehensive Metabolic Panel Yellow serum separator tube    Diabetes mellitus: last A1c 6.8  -     Pt agreed to find if actos can be found at lower cost ([facility]), and if not, to definitely take glipizide.   - Glycosylated Hgb(A1C), Blood Lavender  - Lipid Panel Yellow serum separator tube  - Random Urine Microalb/Creat Ratio Panel  -     Podiatry, optho referrals from prior visits pending    Leg pain  - Pain Clinic  Anxiety: pt states she will establish care with a [facility] psychiatrist. Excellent that she is no longer requiring valium.   - d/c valium      Follow-up in 3 weeks for labs and BP check, 3 months otherwise.  Patient Instruction:  See Patient Education/Instruction section.   Patient was educated on clinical, laboratory and imaging findings.   Barriers to Learning assessed: none. All of the patients questions were answered to patient's satisfaction. Patient verbalizes understanding of teaching

## 2010-08-19 NOTE — Patient Instructions (Addendum)
1. See if actos is on [facility] or [facility] or [facility]'s $5 prescription list. If not, call us to let us know, and take the glipizide previously prescribed.  2. Please bring blood pressure log to next visit.  3. Lab visit - fasting lipid panel if possible (if you cannot return easily when fasting, you may get the panel done today)  4. Take soma only at night this week (this is part of a taper as you have been taking two a day). Continue to use tramadol as needed.  5. Continue to perform full range of motion exercises on your hip. Continue to walk, but not to the point where you strain your hip.  6. Make an appointment with Dr. Foye Clock to further modify your pain regimen off of soma.  7. Stop carvedilol, start metoprolol.

## 2010-08-19 NOTE — Progress Notes (Signed)
ATTENDING NOTE    Subjective:   I reviewed the history and physical exam with the resident.  Katherine Church is a 45 year old female who presents for acute visit due to lateral hip pain that started while she was running a 5 K. Also felt a "pop" in her hip when she was walking later in the week. Despite multiple meds, BP remains poorly controlled although systolic BPs are better at home. Did not bring BP log with her today. Not taking Actos because of cost (patient pays out of pocket).     Review of Systems (ROS): As per  the resident's note.  Past Medical, Family, Social History:  As per  the resident's  note.    Objective: BP 176/112  Pulse 80  Temp(Src) 98.6 F (37 C) (Oral)  Resp 14  Ht 5\' 4"  (1.626 m)  Wt 75.751 kg (167 lb)  BMI 28.67 kg/m2  SpO2 97%    I concur with the resident's exam.    Medical Plan of Care:     Assessment and plan reviewed with the resident physician.  I agree with the resident's plan as documented.    See the resident's note for further details.

## 2010-09-06 ENCOUNTER — Other Ambulatory Visit (INDEPENDENT_AMBULATORY_CARE_PROVIDER_SITE_OTHER): Payer: Self-pay | Admitting: Neurology

## 2010-09-06 MED ORDER — TRAMADOL HCL 50 MG OR TABS
50.0000 mg | ORAL_TABLET | Freq: Three times a day (TID) | ORAL | Status: DC | PRN
Start: 2010-09-06 — End: 2010-10-07

## 2010-09-06 NOTE — Telephone Encounter (Signed)
 Pended and routed.

## 2010-09-06 NOTE — Telephone Encounter (Signed)
Verbally confirmed name of Primary Care Provider: yes    Last visit Date: 08/19/10    Medications:  1. TRAMADOL HCL 50 MG OR TABS --- pt states she is out medication    Name of Pharmacy Updated in Demographics: yes    Name and phone number of pharmacy if not in Epic Database:

## 2010-10-07 ENCOUNTER — Other Ambulatory Visit (INDEPENDENT_AMBULATORY_CARE_PROVIDER_SITE_OTHER): Payer: Self-pay | Admitting: Neurology

## 2010-10-07 ENCOUNTER — Encounter (INDEPENDENT_AMBULATORY_CARE_PROVIDER_SITE_OTHER): Payer: Self-pay | Admitting: Anesthesiology

## 2010-10-07 ENCOUNTER — Ambulatory Visit (INDEPENDENT_AMBULATORY_CARE_PROVIDER_SITE_OTHER): Admitting: Anesthesiology

## 2010-10-07 NOTE — Telephone Encounter (Signed)
Verbally confirmed name of Primary Care Provider: yes    Last visit Date: 08/19/2010    Medications:  traMADol (ULTRAM) 50 MG tablet       Name of Pharmacy Updated in Demographics: yes    Name and phone number of pharmacy if not in Epic Database:

## 2010-10-07 NOTE — Patient Instructions (Signed)
Call to schedule for trigger point injections for upper back.  Go to physical therapy to strengthen back and piriformis muscle.  Return to clinic in 2 months.
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
## 2010-10-07 NOTE — Telephone Encounter (Signed)
Pt called requesting status on refill order below. I informed and reminded pt of turnaround time for refills (2-3 days). Pt verbalized understanding.

## 2010-10-07 NOTE — Progress Notes (Signed)
ATTENDING SUPERVISION NOTE:  I have interviewed and examined the patient at [facility], and I have discussed my findings and recommendations with the patient and the resident. I have reviewed the resident's note including the history, medications, and physical examination.  I concur with the residents assessment and plan, specifically that Katherine Church likely suffers from a combination of upper thoracic myofascial pain and right piriformis syndrome.  She is reluctant to pursue interventions with steroids at this time because of a history of adverse reaction (insomnia, BP lability).  She will try acourse of physical therapy to strengthen the piriformis muscle, topical medications, and a trial of baclofen low dose; side effects of these medications were discussed. We will also schedule her for a trial of trigger point injections to the upper thoracic muscles without steroid.  Please see the resident's note for further details.

## 2010-10-07 NOTE — Telephone Encounter (Signed)
 Pended refill(s) and routing to MD for review and approval

## 2010-10-09 MED ORDER — TRAMADOL HCL 50 MG OR TABS
50.0000 mg | ORAL_TABLET | Freq: Three times a day (TID) | ORAL | Status: DC | PRN
Start: 2010-10-07 — End: 2010-11-04

## 2010-10-09 NOTE — Telephone Encounter (Signed)
Patient would appreciated if refill below can be done today. Please call patient asap when done

## 2010-10-10 ENCOUNTER — Telehealth (INDEPENDENT_AMBULATORY_CARE_PROVIDER_SITE_OTHER): Payer: Self-pay | Admitting: Neurology

## 2010-10-10 NOTE — Telephone Encounter (Signed)
Pt would like Dr. Robin Searing to review her office visit with Dr. Montez Morita on 2/27 with [facility]. Pt states Dr. Montez Morita recommended Baclofen for muscle relaxer for her back, but instructed pt to call pcp to order medication for her. Please advise.

## 2010-10-10 NOTE — Telephone Encounter (Signed)
Received message and routing to Dr. Robin Searing for review and order of Flexeril per Dr. Montez Morita [facility]

## 2010-10-11 MED ORDER — BACLOFEN 10 MG OR TABS
10.0000 mg | ORAL_TABLET | Freq: Three times a day (TID) | ORAL | Status: DC | PRN
Start: 2010-10-11 — End: 2010-11-28

## 2010-10-11 NOTE — Telephone Encounter (Signed)
Called Katherine Church and informed her of message below from Dr. Lonzo Cloud and Katherine Church stated she is frustrated because she feels she doesn't everything asked of her and when she went to Dr. Yetta Barre office "he didn't really do anything but talk to her a few minutes and was out the door" Katherine Church states that she doesn't drive and it cost her a lot of money in transportation to [facility] and doesn't feel like it really helped. Informed Katherine Church she needs to keep DR. Greenfield in the loop and f/u as instructed. Katherine Church verbalized understanding and was grateful for call.

## 2010-10-11 NOTE — Telephone Encounter (Signed)
Pt requesting medication Baclofen be sent to [facility]. Pt states Dr. Yetta Barre, Montez Morita is the MD who recommended medication. Pt was also told by Dr. Montez Morita office PCP will need to fill medication. Please call Pt.

## 2010-10-11 NOTE — Telephone Encounter (Signed)
Routing to Attending Dr. Lonzo Cloud. Notes noted from Dr. There from Dr. Yetta Barre, 10/07/10 visit. Please advise thank you

## 2010-10-11 NOTE — Telephone Encounter (Signed)
She can try baclofen but needs a follow-up to review.  Also she should review side effects of medicaiton with the pharmacist when picking up.  DO NOT DRIVE while taking.  Usually appt required to start new med but since a different MD rec starting will allow start and then a Follow-up to see if helping.

## 2010-11-04 ENCOUNTER — Other Ambulatory Visit (INDEPENDENT_AMBULATORY_CARE_PROVIDER_SITE_OTHER): Payer: Self-pay | Admitting: Neurology

## 2010-11-04 MED ORDER — TRAMADOL HCL 50 MG OR TABS
50.0000 mg | ORAL_TABLET | Freq: Three times a day (TID) | ORAL | Status: DC | PRN
Start: 2010-11-04 — End: 2010-12-02

## 2010-11-04 NOTE — Telephone Encounter (Signed)
Pended refill(s) and routing to MD for review and approval

## 2010-11-04 NOTE — Telephone Encounter (Signed)
Prescription Refill Request Received via fax  Last visit Date: 08/19/2009   Last Refill request per fax: 10/10/2010     Medications:  1. Medication Requested: tramadol HCL 50 mg tablet   Last Fill: 10/10/2010    Name of Pharmacy in patients demographics?yes  If not please update patient's pharmacy demographics.

## 2010-11-27 ENCOUNTER — Telehealth (INDEPENDENT_AMBULATORY_CARE_PROVIDER_SITE_OTHER): Payer: Self-pay | Admitting: Neurology

## 2010-11-27 NOTE — Telephone Encounter (Signed)
Placed call to pt reporting B/S elevated today and pt feels disoriented and very thirsty  B/S:  B/S 325 @ 7:00   B/S 322 @ 7:30  B/S 315 @ 11 am  B/S 202 @ 12 noon   B/S 176 @ 1 pm  B/S 228 @ 3:30 pm    Medications:  8 am Glipizide 2 .5 mg  3:30 pm Glipizide 2.5 mg Pt self medicated with another dose. RX one tablet daily.    Meals and liquids today:  No breakfast  Lunch today: 1/2 diet root beer and 1/2 Tuna sandwich. Pt has only had 1/2 diet root beer today for liquids all day today.    Pt educated on proper diet and liquid intake, medication and exercising with Diabetes.  Pt needs Diabetic education classes.    Nurse Instructed pt to go to ED now d/t very thirsty and disoriented and no food all day with exception of 1/2 Tuna sandwich.  Pt cautioned regarding Ketoacidosis.  Pt not alone friend in home who will take pt to ED now. Pt agreed and verbalized understanding of instructions.    Nurse will follow up in AM    FYI Dr.Greenfield, Dr. Olga Coaster LOV 08/19/10

## 2010-11-27 NOTE — Telephone Encounter (Signed)
Verbally confirmed name of Primary Care Provider: yes    What is reason for call: Pt's BS this morning was 325, pt took her Glipizide medication and tested her BS again and went down to 176. Pt states her 228 right now and has been feeling disoriented and very thirsty. Pt requesting nurse advise.     Confirmed Contact Number:yes    This message will be transmitted to our triage nurse, you can expect a call by the end of the working day.

## 2010-11-28 ENCOUNTER — Encounter (INDEPENDENT_AMBULATORY_CARE_PROVIDER_SITE_OTHER): Payer: Self-pay | Admitting: Internal Medicine

## 2010-11-28 ENCOUNTER — Emergency Department
Admit: 2010-11-28 | Discharge: 2010-11-28 | Disposition: A | Discharge status: Home Health | Payer: Self-pay | Attending: Emergency Medicine | Admitting: Emergency Medicine

## 2010-11-28 ENCOUNTER — Ambulatory Visit (INDEPENDENT_AMBULATORY_CARE_PROVIDER_SITE_OTHER): Admitting: Internal Medicine

## 2010-11-28 ENCOUNTER — Other Ambulatory Visit: Payer: Self-pay

## 2010-11-28 ENCOUNTER — Encounter (INDEPENDENT_AMBULATORY_CARE_PROVIDER_SITE_OTHER): Admitting: Internal Medicine

## 2010-11-28 ENCOUNTER — Other Ambulatory Visit (INDEPENDENT_AMBULATORY_CARE_PROVIDER_SITE_OTHER)

## 2010-11-28 VITALS — BP 150/110 | HR 88 | Temp 98.7°F | Resp 16 | Ht 64.0 in | Wt 172.0 lb

## 2010-11-28 LAB — URINALYSIS
Nitrite: NEGATIVE
pH: 6 (ref 5.0–8.0)

## 2010-11-28 MED ORDER — ONDANSETRON HCL 4 MG/2ML IV SOLN
4.0000 mg | Freq: Once | INTRAMUSCULAR | Status: DC | PRN
Start: 2010-11-28 — End: 2010-11-28
  Filled 2010-11-28: qty 2

## 2010-11-28 MED ORDER — ONDANSETRON HCL 4 MG/2ML IV SOLN
4.0000 mg | Freq: Once | INTRAMUSCULAR | Status: DC | PRN
Start: 2010-11-28 — End: 2010-11-28

## 2010-11-28 MED ORDER — TRAMADOL HCL 50 MG OR TABS
50.0000 mg | ORAL_TABLET | Freq: Once | ORAL | Status: DC | PRN
Start: 2010-11-28 — End: 2010-11-28
  Filled 2010-11-28: qty 1

## 2010-11-28 MED ORDER — ONDANSETRON HCL 4 MG/2ML IV SOLN
INTRAMUSCULAR | Status: AC
Start: 2010-11-28 — End: 2010-11-28
  Filled 2010-11-28: qty 2

## 2010-11-28 NOTE — ED Notes (Addendum)
 ============================== ADMIT SUMMARY ==============================    RECEIVING NURSE -    ED NURSE -     +------------------------------- ALLERGIES -------------------------------+   Diphth-Acell Pertussis-Tetanus-Swelling (05/18/2009);     +-------------------------- ADMITTING DIAGNOSIS --------------------------+                                                                                 +--------------------------- ADMITTING SERVICE ---------------------------+  ADMISSION SERVICE -    LEVEL OF CARE -    ATTENDING -    RESIDENT  -      +------------------------ MOST RECENT VITAL SIGNS ------------------------+  BP - 161/89                          PULSE - 72                             RESPIRATIONS - 16                    O2 SAT - 100                           TEMPERATURE -                        MODE -                                 GCS TOTAL -                                                                 PAIN - 4                             PAIN QUALITY - Constant                PAIN LOCATION - HA                                                          DATE/TIME - 11/28/2010 1745    +-------------------------------- FLUIDS ---------------------------------+  DATE  TIME  IV FLUID           L/R   LOCATION          SIZE   HUNG ABSORBED  ---------------------------------------------------------------------------                                         TOTAL IV:  0 ml    TOTAL OUTPUT:     0 ml               TOTAL PO:                         0 ml    +------------------------------ MEDICATIONS ------------------------------+  DATE  TIME  MEDICATION                           VERIFYING RN      RN INIT  ---------------------------------------------------------------------------    04/19 1349  Ondansetron HCl 4 MG IV                                    dew  04/19 1349  Sodium Chloride 0.9 % 1000                                 dew              Milliliters IV-(BOLUS)  04/19 1440   Tramadol HCl 50 MG PO                                      dew  04/19 1545  Sodium Chloride 0.9 % 1000                                 dew              Milliliters IV-(BOLUS)  04/19 1545  Ondansetron HCl 4 MG IV                                    dew    +------------------------------- LABS DONE -------------------------------+  ACT- MD    MD   RN   AP                                         +INITIALS+  IVE DATE  TIME TIME TIME  TREATMENT ORDERS                      MD  RN  AP   ---------------------------------------------------------------------------        04/19 1317 1320       Basic Metabolic Panel                 SE  dew                                  Collect New Specimen                              Specimen Type:  Blood      04/19 1318 1320       CBC with Differential                 SE  dew                                  Collect New Specimen                              Specimen Type:  Blood      04/19 1318 1320       Urinalysis, Clean Catch               SE  dew                                  Collect New Specimen                              Specimen Type:  Urine    EKG DONE - NO    +---------------------------- PROCEDURE NOTES ----------------------------+      +------------------------ CURRENT MEDICATION --------------------------+       clonidine, diazepam, ultram    for       soma    for       Amlodipine Besy-Benazepril HCl    for  Continuous     Aldactone Please review with MD.    for  Continuous     Lisinopril Please review with MD.    for  Continuous  +------------------------ OTHER NURSING PROCEDURES -----------------------+                                                                               +--------------------------- PSYCHOSOCIAL NEEDS --------------------------+  +------------------------- BARRIERS TO LEARNING --------------------------+    ASSESSMENT- Assessment Done with Findings of:                                    BARRIERS-    No Barriers  SUPPORT PERSON-                                                                  SPECIAL CONSIDERATIONS-                                                                                ============================== TRIAGE RECORD ==============================    CHIEF COMPLAINT- Nausea  TIME OF ONSET- 2 days              :   +-STANDING-+     +--SEATED--+  TRIAGE CATEGORY- 2                   :   BP     PULSE     BP     PULSE             ROOM- 3                   : N/A/N/A   N/A    124/88    73   MODE OF ARRIVAL- Walked              :   IN CUSTODY- No                       :  TEMP MODE O2SAT RESP  LMP        PRIVATE MD- N/A                      : 98.2   Oral  100   N/A N/A                                                    :   WORK RELATED INJURY- No              : +--GCS--+     +--PUPILS--+  RETURN IN 72 HOURS- Yes-Non-[facility] : E V M TOT     L R RESPONSE  TRIAGE NURSE- ondeana price          : 4 5 6  15     3 3   PERRL                IS THIS VISIT RELATED TO ASSAULT- No                        IS THIS VISIT RELATED TO DOMESTIC VIOLENCE- No                        DOES THIS PATIENT EXPRESS SUICIDAL IDEATION/INTENT- No                            PAIN TYPE- V    NOW-  5   TOLERABLE AT-  3     QUALITY- Constant                PAIN LOCATION- ha         RADIATES TO- no                              LATEX ALLERGY FORM- N/A   LATEX ALLERGY- N/A   TETANUS- < 5 years        IMMUNIZATION- N/A         PED HEIGHT- N/A      WEIGHT- N/A   KG  ADDITIONAL FORMS- N/A  +------------------------- CARE PRIOR TO ARRIVAL -------------------------+   11/28/2010 1031   Koleen Nimrod, RN   Referring MD or Clinic: dr Ileene Rubens  +------------------------------- ALLERGIES -------------------------------+  MEDICATION            ALLERGY                                   REACTION  ---------------------------------------------------------------------------    Diphth-Acell Pertuss  Swelling                                   Swelling       +------------------------------ MEDICATIONS ------------------------------+    MEDICATION NAME                       DOSAGE                FREQUENCY  ---------------------------------------------------------------------------    Lisinopril Please review with MD.     N/A                   N/A             Aldactone Please review with MD.      N/A                   N/A             Amlodipine Besy-Benazepril HCl        N/A                   N/A             soma                                  N/A                   N/A             clonidine, diazepam, ultram           N/A                   N/A               +-------------------------- PAST MEDICAL HISTORY -------------------------+                                                                               +---------------------------- CURRENT HISTORY ----------------------------+   feeling nauseated since last noc seen at clinic fsg 236 and bp 150/110.   now c/o nausea with dizziness. c/o HA with blurred vision. fsg in triage   267    =========================== REASSESSMENT VITALS ===========================                         R    T    M         ET  E    E    O         CO2 PU-                                                S    M    D O2  ET  TY- PIL +---GCS----+            DATE  TIME   BP    HR  P    P    E SAT CO2 PE  L R E  V  M  TOT  POSITION         ---------------------------------------------------------------------------    04/19 1225 124/88  73      98.2  O 100         3 3 4  5  6  15    Seated    04/19 1225    /                                                  Standing  04/19 1240    /        16                                                  04/19 1530 158/91  75  16  98.3  O 100                           Lying     04/19 1745 161/89  72  16          100                           Lying                   +-----------------PAIN-----------------+              T                                                            I                Y  N  T                                                     N                P  O  O  I    DATE  TIME  E  W  L LOCATION   QUALITY      RADIATES    COMMENT         T    ---------------------------------------------------------------------------    04/19 1225  V  5  3 ha         Constant     no          Triage          omp  04/19 1225                                              Triage          omp  04/19 1240                                                              JMP  04/19 1530  V  5  4 H/A        Constant     n/a         Comfort Measure dew  04/19 1745  V  4  3 HA         Constant     no          Comfort Measure dew    ============================= NURSE DISCHARGE =============================    DISCHARGE NURSE- david wallace                                               DISPOSITION- Discharged from ED             WITH- By Self                    ACCOMPANIED BY- N/A                                                          EQUIP Brantley Fling-    N/A                                                                  TIME OF DISPOSITION- 11/28/2010 1745  LEFT ED VIA- Wheel Chair               TRANSFERRED TO- N/A                   REASON- N/A                            ADMITTED TO- N/A  ROOM- N/A                              NURSE REPORT TO- N/A                  REPORT TIME- X  N/A                    BELONGINGS- N/A                       ENVELOPE NUMBER- N/A                   CONDITION ON DISCHARGE- Stable                                               AFTERCARE PROVIDED WITH- Written and Verbal                                  WHAT AFTERCARE INSTRUCTIONS WERE GIVEN AND REVIEWED WITH PATIENT  AND/OR FAMILY?-    (see EPIC instructions)   Hyperglycemia  (elevated blood glucose, high sugar);                  IN WHAT LANGUAGE WERE THESE GIVEN?- English        OTHER: N/A                TRANSLATED BY- N/A                        OTHER: N/A                          GCS-  E: 4   V: 4   M: 5   TOTAL: 13   PAIN LEVEL UPON DISPOSITION- 4  OUT OF 10  WHAT MEDS WERE PROVIDED FROM DISCHARGE PYXIS?-    None                                                                 RX TO BE FILLED FOR-    Zofran ODT;                                              DID THE PATIENT OR RESPONSIBLE CARE PROVIDER UNDERSTAND THE FOLLOW UP  RECOMMENDATION?- Yes                   DISPOSITION BY- RN                    NURSING LEVEL- 3. ED Stay with Multiple Interactions                           +------------------------------- RESTRAINTS ------------------------------+  ALTERNATIVE ATTEMPTS:    -------------------------- RESTRAINT ASSESSMENTS --------------------------  ============================== POINT  OF CARE ==============================    OCCULT BLOOD STOOL RESULTS   Norm results neg.  DATE  TIME    RESULTS     DONE BY    CONTROL POSTIVE    CONTROL NEGATIVE      URINE PREGNANCY TEST   Norm results for non pregnant females neg.  DATE  TIME    RESULTS     DONE BY    CONTROL POSTIVE      URINE DIP   Norm results - All neg. with pH 5.0 to 8.0 and urobili 0.2 to 1.0                LEUKO  NI-        PRO-  GLU-          URO-                   DATE  TIME    CYTE  TRITE PH    TEIN  COSE  KETONES BILI  BILI  BLOOD BY   04/19 1348    -     -     5.0   3+    trace 1+      0.2   -     trace dew    FINGER STICK GLUCOSE   Norm results 65 to 110 mg/dl  DATE  TIME    RESULTS     DONE BY    04/19 1226    267         omp                   04/19 1532    185         dew                   04/19 1654    145         dew                     FINGER STICK HEMOGLOBIN   Norm results adult female 50 to 17 gm/dl  Norm results adult female 22 to 16 gm/dl  DATE  TIME    RESULTS     DONE BY      ============================== MD NOTES H&P ===============================    TIME OF NOTE WRITTEN- N/A                                                    CHIEF COMPLAINT- Nausea                                                         HISTORY OF PRESENT ILLNESS  04/19 2033    Briscoe Burns, MD            45 yo F h/o dm controlled by diet & exercise was sent here          from clinic for BS in 200s and feeling disoriented. she was          prescribed metformin for her dm but it caused gi upset so  she didn't use it. then she was prescribed glipizide but was

## 2010-11-28 NOTE — ED Notes (Addendum)
=================================   ORDERS ==================================    ACT- MD    MD   RN   AP                                           IVE DATE  TIME TIME TIME  TREATMENT ORDERS                       MD   RN     ---------------------------------------------------------------------------

## 2010-11-28 NOTE — Telephone Encounter (Signed)
Pt calling today re B/S 257  Did not go to the ED as advised below. Pt concerned because her appt scheduled for today at 1430 and she will be fasting for lipid profile.  She states she has to take a taxi to be seen because she has no one else to drive her.  Rescheduled her appointment: 11/28/2010 9:45 AM Ileene Rubens, MD [facility]

## 2010-11-28 NOTE — Interdisciplinary (Signed)
Pt complains of feeling dizzy "just not right" feels disoriented thought process slow. BLurred Vision, checked BP 150/110 x3 manually and blood sugar 236. MD notified.

## 2010-11-28 NOTE — Progress Notes (Signed)
Attending Note:  Chief Complaint   Patient presents with   . Diabetes     blood sugars have been elevated      Subjective:  I reviewed the history.  Patient interviewed and examined.   Katherine Church is a 45 year old female is here for acute issue as above.  Pt is cared for by Dr. Robin Searing, I am seeing this patient for Dr. Robin Searing in her absence.   Pt with difficult with oral remedies for DM, and recently with Glipizide but nausea.   No Fever/Chills/sweats. Denies URI's.  No dysura, urinary freqency, or urinary urgency.  Denies CP.  The only active issue has been abd bloating, 2 d ago, and constipation, and has resolved. + Epigastric pain. + flushed with sweats.  Unclear cause of elevated BS. She also feel disoriented yesterday. Unable to express thoughts.   Labs drawn this AM.  Review of Systems - 12 step ROS reviewed, and negative except as noted in HPI and PMHx.    Past Medical, Family, Social History:  As per  the resident's  Note.  Patient Active Problem List   Diagnoses Date Noted   . Myofascial pain 10/07/2010   . Piriformis syndrome 10/07/2010   . Screening for breast cancer 03/02/2010   . Screening for malignant neoplasm of breast 09/14/2009   . Acute ischemic colitis 09/08/2009   . Abdominal pain 09/02/2009   . Hypertension, malignant 05/22/2009   . Retinal hemorrhage 05/22/2009   . Diabetes 05/18/2009   . Hypertension 05/18/2009   . Chronic back pain 05/18/2009   . Anxiety 05/18/2009     Past Medical History   Diagnosis Date   . Back pain    . High blood pressure    . Diabetes mellitus      Objective:   I have examined the patient and I concur with the resident's exam.  Recent labs and imaging as well as other data reviewed.  Results for Katherine Church, Katherine Church (MRN [id number]) as of 11/28/2010 10:10   Ref. Range 09/08/2009 07:30 05/02/2010 15:25   Sodium Latest Range: 136-145 mmol/L 140    Potassium Latest Range: 3.5-5.1 mmol/L 3.9    Chloride Latest Range: 98-107 mmol/L 100    Bicarbonate Latest Range:  22-29 mmol/L 24    Bun Latest Range: 6-20 mg/dL 4 (L)    Creatinine Latest Range: 0.51-0.95 mg/dL 7.82 (L)    GFR No range found >60    Glucose Latest Range: 70-115 mg/dL 956 (H)    Glyco Hgb (O1H) Latest Range: 4.8-5.9 %  6.8 (H)   Calcium Latest Range: 8.6-10.5 mg/dL 8.8    Phosphorous Latest Range: 2.7-4.5 mg/dL 3.8    TSH Latest Range: 0.27-4.20 uIU/mL  1.27   Magnesium Latest Range: 1.7-2.6 mg/dL 2.0    WBC Latest Range: 4.0-10.0 1000/mm3 8.7    RBC Latest Range: 3.90-5.20 mill/mm3 4.12    Hgb Latest Range: 11.2-15.7 gm/dL 08.6    Hct Latest Range: 34.0-45.0 % 35.9    MCV Latest Range: 79.0-95.0 um3 87.1    MCH Latest Range: 26.0-32.0 pgm 28.4    MCHC Latest Range: 32.0-36.0 % 32.6    RDW Latest Range: 12.0-14.0 % 12.9    Plt Count Latest Range: 518 697 1313/mm3 289    MPV Latest Range: 9.4-12.4 fL 9.6    Segs Latest Range: 34-71 % 70    Lymphocytes Latest Range: 19-53 % 22    Monocytes Latest Range: 5-12 % 5    Eosinophils Latest  Range: 1-7 % 3    Abs Neutrophils Latest Range: 1.6-7.0 1000/mm3 6.1    Abs Lymphs Latest Range: 0.8-3.1 1000/mm3 1.9    Abs Monos Latest Range: 0.2-0.8 1000/mm3 0.4    Abs Eosinophils Latest Range: 0.0-0.5 1000/mm3 0.2    Imm Gran Abs Latest Range: 0.0-0.1 1000/mm3 0.1    Imm Gran % Latest Range: 0-1 % 1    Diff Type No range found Automated      Assessment/Plan:    Assessment and plan reviewed with the resident physician.  I agree with the resident's plan as documented.  Pt with Dm, nausea, altered MS, inability to keep down fluids. Will refer to ED for further evaluation, ED notified.   See the resident's note for further details.      Follow up as per resident.    Patient Instruction:  See Patient Education/Instruction section.      Barriers to Learning assessed: none. All of the patients questions were answered to patient's satisfaction. Patient verbalizes understanding of teaching and instructions and is agreeable to above plan.

## 2010-11-28 NOTE — Telephone Encounter (Signed)
Patient called this morning stating her BS are very elavated. Patient is schedule to be seen by Dr. Ileene Rubens today at 2:30 pm. Please call patient at [phone]

## 2010-11-28 NOTE — Progress Notes (Signed)
Chief Complaint   Patient presents with   . Diabetes     blood sugars have been elevated       Subjective:   Katherine Church is a(n) 45 year old female who presents for a focused visit. Her BS has been in the 300s in the last days, feeling like under the weather, headaches, blurry vision, dizziness, increased thurst. Has not been taking actose as insurance issues and also not glipizide. She started glipizide yesterday which made her nauseated and she was not able to eat. No vomiting.  She also took it this morning and was not able to eat 2/2 nausea. She denies diarrhea, dysuria, URI, cp, sob, abdominal pain, fever, chills, cough.     ROS: negative on 10 point scale other than mentioned above    History:  Patient Active Problem List   Diagnoses   . Diabetes   . Hypertension   . Chronic back pain   . Anxiety   . Hypertension, malignant   . Retinal hemorrhage   . Abdominal pain   . Acute ischemic colitis   . Screening for malignant neoplasm of breast   . Screening for breast cancer   . Myofascial pain   . Piriformis syndrome     Past Medical History   Diagnosis Date   . Back pain    . High blood pressure    . Diabetes mellitus      Past Surgical History   Procedure Date   . Anesth,tubal ligation/transection 1994     History     Social History   . Marital Status: Single     Spouse Name: N/A     Number of Children: 2   . Years of Education: N/A     Occupational History   . unemployed      Social History Main Topics   . Smoking status: Never Smoker    . Smokeless tobacco: Never Used   . Alcohol Use: No   . Drug Use: No   . Sexually Active: Yes -- Female partner(s)      tubal ligation     Other Topics Concern   . Blood Transfusions No   . Special Diet No   . Exercise Yes     tries to walk     Social History Narrative    Two kids-adult in [place] (going thru med school [place]); son still deciding.Born and raised in [place].Moved to [facility] in august 2010-boyfriend works for [employer].     Current Outpatient  Prescriptions   Medication Sig Dispense Refill   . traMADol (ULTRAM) 50 MG tablet Take 1 tablet by mouth every 8 hours as needed for Moderate Pain (Pain Score 4-6).  90 tablet  0   . DISCONTD: baclofen (LIORESAL) 10 MG tablet Take 1 tablet by mouth 3 times daily as needed (muscle spasm).  30 tablet  0   . metoprolol tartrate (LOPRESSOR) 50 MG tablet Take 1 tablet by mouth 2 times daily.  60 tablet  0   . lisinopril (PRINIVIL, ZESTRIL) 40 MG tablet Take 1 tablet by mouth daily.  30 tablet  4   . docusate sodium (COLACE) 100 MG capsule Take 2 capsules by mouth 2 times daily.  360 capsule  4   . DISCONTD: pioglitazone (ACTOS) 15 MG tablet Take 1 tablet by mouth daily.  30 tablet  1   . clonidine (CATAPRES) 0.3 MG tablet Take 1 tablet by mouth 3 times daily.  180 tablet  3   . glipiZIDE (GLUCOTROL XL) 2.5 MG CR tablet Take 1 tablet by mouth daily.  30 tablet  0   . Gabapentin, PHN, 300 MG TABS Take 300 mg by mouth At bedtime as needed (burning pain outside of foot).  30 tablet  0   . simvastatin (ZOCOR) 10 MG tablet Take 1 tablet by mouth every evening.  90 tablet  4   . amLODIPINE (NORVASC) 10 MG tablet Take 1 tablet by mouth daily.  90 tablet  4     Allergies   Allergen Reactions   . Tetanus Toxoids Swelling     Family History   Problem Relation Age of Onset   . Diabetes Father    . Lipids Father    . Hypertension Father    . Hypertension Mother          Objective:    General Appearance: healthy, alert, no distress, pleasant affect, cooperative.  Eyes:  conjunctivae and corneas clear. PERRL, EOM's intact.  Mouth: normal.  Neck:  Neck supple. No adenopathy, thyroid symmetric, normal size.  Heart:  normal rate and regular rhythm, no murmurs, clicks, or gallops.  Lungs: clear to auscultation and percussion, no chest deformities noted.  Abdomen: Abdomen soft, non-tender.  No masses or organomegaly. Bowel sounds normal. .  Extremities:  no cyanosis, clubbing, or edema.      Labs: no labs for this  encounter    A/P:    #Eleveated blood glucose/nausea: Last A1c was 6.8 last year, but not sure if patient was on medication that time. Her BG was at 236 in the office and despite VS just noticeable for elevated BP she was not feeling well, pale and not able to ambulate safely 2/2 nausea. Unclear etiology of her suddenly elevated BG as she states despite incompliance with her meds, her BG was usually in the 100s. Will send patient to urgent care to r/o causes for elevated BG, rehydration and observation.     Pt seen and d/w Dr. Ileene Rubens    Cc: Robin Searing, Sheppard Evens, MD

## 2010-11-28 NOTE — Patient Instructions (Signed)
If you have any questions please do not hesitate to call me at [phone].    Thank you.

## 2010-11-28 NOTE — ED Notes (Addendum)
 not taking it because she felt she was controlling her          glucose with diet & exercise and didn't need the medication.          the last couple of days her blood glucose has been in the          high 200s and went to see her pcp today, which is when she          was referred here. she complains of nausea, no vomiting.          denies f/c/d/c/cp/sob/recent illness/lh/dizziness. she does          feel fatigued but otherwise no body aches or pains.       PAST MEDICAL/SURGICAL HISTORY          N/A           FAMILY HISTORY- N/A                                                          SOCIAL HISTORY-       SMOKING   ALCOHOL   ILLICIT DRUGS   HOMELESS   MARRIED   EMPLOYED         N/A       N/A           N/A           N/A        S             UNEMPLOYED  OTHER- N/A                                                                   REVIEW OF SYSTEMS- N/A                                                         PHYSICAL EXAM  04/19 2033    Briscoe Burns, MD            AFVSS          gen- nad, a&ox3, anwers questions appropriately,          cooperative          cv- rrr          pulm- ctab          abd- s, nt, nd, +bs          ext- +dp & rp bilaterally, FROM in all extremities, no LE          edema    04/19 2033    Briscoe Burns, MD            normal gait      IMPRESSION  04/19 2033    Briscoe Burns, MD            45 yo F h/o DM not taking her  prescribed medications          complaining of elevated blood glucose and feeling          disoriented. will check basic labs, give ivf & zofran.          reassess.      MEDICAL DECISION MAKING  04/19 2033    Briscoe Burns, MD            as above.    CASE PRESENTED TO- N/A                                                         ============================= PHYSICIAN NOTES =============================      ============================= PROCEDURE NOTES =============================      ================================ LAB NOTES  ================================    04/19 1415    Briscoe Burns, MD            UA: contaminated, 2+ glucose, 3+ protein, small ketones, 3-5          wbc    04/19 1424    Briscoe Burns, MD            gluc 221slt dec na & cl    04/19 1428    Briscoe Burns, MD            elev wbc, no left shift      ================================= IMAGES ==================================      ============================== MD DISCHARGE ===============================    DISCHARGE PHYSICIAN- Briscoe Burns                                         CHIEF COMPLAINT- Nausea               CASE PRESENTED TO- Vic Ripper Ly             CONDITION OF DISCHARGE- Stable          WAS THIS VISIT FOR A WORK RELATED ILLNESS OR INJURY- No                      PRIMARY CARE PHYSICIAN- Faustino Congress M.D.                               HAS PCP BEEN CONTACTED- N/A           H&P NOTE WAS DICTATED- No     +-------------------------- DISCHARGE DIAGNOSIS --------------------------+          787.02  NAUSEA ALONE                                                   +------------------------- DISCHARGE INSTRUCTIONS ------------------------+    04/19 1721    Briscoe Burns, MD            PHYSICIAN- Joselyn Glassman, MD Attending       FOLLOW-UP(DAYS)- N/A              APPOINTMENT- N/A   RETURN TO- N/A  LANGUAGE- English             INSTRUCTIONS-          MEDICATIONS-          REFERRAL CLINICS-          REFERRAL PHYSICIANS-          ADDITIONAL INSTRUCTIONS-            N/A                                                     +----------------------- MEDICATION RECONCILIATION -----------------------+    ---------------------------- CURRENT MEDICATION ---------------------------  MEDICATION NAME                       DOSAGE                FREQUENCY  ---------------------------------------------------------------------------    Lisinopril Please review with MD.     N/A                   N/A             Aldactone Please review with MD.      N/A                   N/A              Amlodipine Besy-Benazepril HCl        N/A                   N/A             soma                                  N/A                   N/A             clonidine, diazepam, ultram           N/A                   N/A               ---------------------------- STOPPED MEDICATION ---------------------------  MEDICATION NAME                       DOSAGE                FREQUENCY  ---------------------------------------------------------------------------      ---------------------------- UPDATED MEDICATION ---------------------------  MEDICATION NAME                       DOSAGE                FREQUENCY  ---------------------------------------------------------------------------      ----------------------------- ADDED MEDICATION ----------------------------  MEDICATION NAME                       DOSAGE                FREQUENCY  ---------------------------------------------------------------------------    Zofran ODT  1 MG                  When Necessary      ============================= FOLLOW UP NOTES =============================    04/22 12/01/2010  1704    Joselyn Glassman, MD Attending            Reason for Addendum or Follow Up: Routine ED Patient Call          Back          Action Taken: Patient unable to be contacted, no message          left          no reliable patient identifier on voicemail

## 2010-11-28 NOTE — ED Notes (Addendum)
==============================   ATTENDING NOTE =============================    04/19 1737    Joselyn Glassman, MD Attending            pt seen with resident                    cc. nausea          hpi. pt with h/o DM controlled with diet and excercise.          referred from clinic for nausea and hyperglycemia. has rx          for glipizide but pt not taking. no other complaints.           pmh/med/allergies confirmed          exam          gen nad          heent eomi perrl op clear          neck no LAN, no TM          resp ctab          cvs rrr, no mrg          abd soft ndnt          ext well perfused          skin no rash          impression          nausea          hyperglycemia          diabetes          plan          iv fluids          antiemetics          encouraged pt to start taking glipizide as prescribed          f/u with PMD

## 2010-11-29 ENCOUNTER — Telehealth (INDEPENDENT_AMBULATORY_CARE_PROVIDER_SITE_OTHER): Payer: Self-pay | Admitting: Neurology

## 2010-11-29 MED ORDER — PROMETHAZINE HCL 12.5 MG OR TABS
12.5000 mg | ORAL_TABLET | Freq: Two times a day (BID) | ORAL | Status: DC | PRN
Start: 2010-11-29 — End: 2010-12-30

## 2010-11-29 NOTE — Telephone Encounter (Signed)
Verbally confirmed name of Primary Care Provider: yes    What is reason for call: High BS this morning is 256 and has not had breakfast yet. Pt doubled her Glipizide medication took it at 4:00am today. Pt was in the ED yesterday and is requesting further advise.    Confirmed Contact Number:yes    This message will be transmitted to our triage nurse, you can expect a call by the end of the working day.

## 2010-11-29 NOTE — Telephone Encounter (Signed)
Paged MD @ [phone] -- Dr. Ileene Rubens returned call at 1607 Discussed pt request for Phenergan, ED visit. Pt reported she "feels" better, however only wants to eat. Has continued na/ and f/up appointment with Dr. Ileene Rubens on Monday 04/23.   - Order for Phenergan 12.5 mg po Q12H prn for nausea, dispense 45.   - Review ED precautions with patient.

## 2010-11-29 NOTE — Telephone Encounter (Signed)
Pt BS today at 10am 246. Pt requesting medication phenergan be called into pharmacy. Pt requesting call back from nurse.

## 2010-11-29 NOTE — Telephone Encounter (Signed)
BS 209 at 3 pm. Pt is having difficulty with appetite. States Zofran is not assisting her with eating at this time. She states she feels a little anxious because she wants to eat. Has had phenergan in the past. She is not having any other symptoms. Requested medication called to pharmacy. Advised her if she is still symptomatic she needs to return to ED. Will fwd to provider. Allergies/Pharmacy verified.

## 2010-11-29 NOTE — Telephone Encounter (Signed)
Discussed below. Pt is currently asymptomatic. In ED was advised to increase her Glipizide to 5 mg po daily. Pt took medication at 4am. Reviewed symptoms of hyperglycemia: blurry vision, abd pain, na/v/, diaphoresis, disorientation, feeling weak/tired. She states she does not feel like she did yesterday.     Advised: increase fluids, recheck glucose levels at 10a & 4pm (glipizide time to peak 6-12h). For symptoms return to ED. For questions call back PCP office.    Pt will call at 4 pm with glucose levels if she remains asymptomatic.  She will call back for new or worsening symptoms.  Scheduled: 12/02/2010 10:30 AM Ileene Rubens, MD [facility]

## 2010-11-29 NOTE — Telephone Encounter (Signed)
Spoke with patient. Reviewed below. Advised for any of the below symptoms she needs to be seen in the ED.   She also should continue to increase her fluids as previously discussed. Pt verbalized understanding and agreeable to plan of care    What are the signs and symptoms of diabetic ketoacidosis/hyperglycemia? With DKA, you may feel very thirsty, and urinate more than normal. You also may have any of the following signs and symptoms:   Abdominal pain, nausea, and vomiting. You may vomit blood that looks like coffee grounds.    Blurry vision.     Decreased appetite for food, and weight loss.    Dry mouth, eyes, and skin. Your face also may be red and feel warm.    Fast, deep breathing. You also may feel like your heart is beating faster than normal.    Feeling very weak, tired, and confused.    Fruity-smelling breath.    Mood changes and irritability (easily angered)

## 2010-12-02 ENCOUNTER — Encounter (INDEPENDENT_AMBULATORY_CARE_PROVIDER_SITE_OTHER): Payer: Self-pay | Admitting: Internal Medicine

## 2010-12-02 ENCOUNTER — Ambulatory Visit (INDEPENDENT_AMBULATORY_CARE_PROVIDER_SITE_OTHER): Admitting: Internal Medicine

## 2010-12-02 VITALS — BP 124/82 | HR 82 | Temp 98.5°F | Resp 16 | Ht 64.0 in | Wt 170.0 lb

## 2010-12-02 MED ORDER — TRAMADOL HCL 50 MG OR TABS
50.0000 mg | ORAL_TABLET | Freq: Three times a day (TID) | ORAL | Status: DC | PRN
Start: 2010-12-02 — End: 2010-12-30

## 2010-12-02 MED ORDER — CARVEDILOL 25 MG OR TABS
20.00 mg | ORAL_TABLET | Freq: Two times a day (BID) | ORAL | Status: DC
Start: 2010-12-02 — End: 2011-04-19

## 2010-12-02 MED ORDER — GLIPIZIDE 5 MG OR TB24
5.0000 mg | ORAL_TABLET | Freq: Every day | ORAL | Status: DC
Start: 2010-12-02 — End: 2011-04-08

## 2010-12-02 NOTE — Progress Notes (Signed)
Chief Complaint   Patient presents with   . Other     f/u on blood sugars and blood pressure       SUBJECTIVE::Katherine Church is a 45 year old female here for close follow up of DM/nausea and ED FU.  Pt is cared for by Katherine Church, I am seeing this patient for Katherine Church in her absence.   Please see last note in Epic, 11/28/10 and ED notes from 11/28/10.  In the ED, pt was fully eval'd, labs done and pt was given IV hydration and Zofran. She was recommended to take Glipizide 2.5 mg, two pills a day. So far, she is feeling much better, with no more nausea, but with phenergan on board.  She is tolerating the increased dose of Glipizide. BS 253/192, 10 and 4 pm, on 4/21,BS 211/176, on 4/22, today, 212, and POCT today, 187. Still fatigued, and needed to sleep all weekend. Unable to walk, she feels makes her feel better and helps control BS. Denies hypoglycemic sx's.  She reports her BF is now feeling nausea and fatigued, so wonder if she was suffering from a viral syndrome.    No Fever/Chills/sweats.   No dysuria, urinary freqency, or urinary urgency. Some CVA-TTP and says tendency for UTI, but drinking a lot of fluids.    Of note, she states she is not taking Zocor any longer or gapapentin.      Patient Active Problem List   Diagnoses Date Noted   . Myofascial pain 10/07/2010   . Piriformis syndrome 10/07/2010   . Screening for breast cancer 03/02/2010   . Screening for malignant neoplasm of breast 09/14/2009   . Acute ischemic colitis 09/08/2009   . Abdominal pain 09/02/2009   . Hypertension, malignant 05/22/2009   . Retinal hemorrhage 05/22/2009   . Diabetes 05/18/2009   . Hypertension 05/18/2009   . Chronic back pain 05/18/2009   . Anxiety 05/18/2009     Past Medical History   Diagnosis Date   . Back pain    . High blood pressure    . Diabetes mellitus        Social History:  History   Substance Use Topics   . Smoking status: Never Smoker    . Smokeless tobacco: Never Used   . Alcohol Use: No        Allergies:  Allergies   Allergen Reactions   . Tetanus Toxoids Swelling       Current Outpatient Prescriptions   Medication Sig   . carvedilol (COREG) 25 MG tablet Take 1 tablet by mouth 2 times daily.   . traMADol (ULTRAM) 50 MG tablet Take 1 tablet by mouth every 8 hours as needed for Moderate Pain (Pain Score 4-6).   Marland Kitchen glipiZIDE (GLIPIZIDE XL) 5 MG CR tablet Take 1 tablet by mouth daily.   . promethazine (PHENERGAN) 12.5 MG tablet Take 1 tablet by mouth every 12 hours as needed for Nausea.   Marland Kitchen DISCONTD: traMADol (ULTRAM) 50 MG tablet Take 1 tablet by mouth every 8 hours as needed for Moderate Pain (Pain Score 4-6).   Marland Kitchen metoprolol tartrate (LOPRESSOR) 50 MG tablet Take 1 tablet by mouth 2 times daily.   Marland Kitchen lisinopril (PRINIVIL, ZESTRIL) 40 MG tablet Take 1 tablet by mouth daily.   Marland Kitchen docusate sodium (COLACE) 100 MG capsule Take 2 capsules by mouth 2 times daily.   . clonidine (CATAPRES) 0.3 MG tablet Take 1 tablet by mouth 3 times daily.   Marland Kitchen DISCONTD:  glipiZIDE (GLUCOTROL XL) 2.5 MG CR tablet Take 1 tablet by mouth daily.   Marland Kitchen DISCONTD: Gabapentin, PHN, 300 MG TABS Take 300 mg by mouth At bedtime as needed (burning pain outside of foot).   Marland Kitchen amLODIPINE (NORVASC) 10 MG tablet Take 1 tablet by mouth daily.   Marland Kitchen DISCONTD: simvastatin (ZOCOR) 10 MG tablet Take 1 tablet by mouth every evening.       ROS:12 point review of systems, reviewed and negative except as per HPI and PMHx.      OBJECTIVE:: BP 124/82  Pulse 82  Temp(Src) 98.5 F (36.9 C) (Oral)  Resp 16  Ht 5\' 4"  (1.626 m)  Wt 77.111 kg (170 lb)  BMI 29.18 kg/m2  SpO2 99%  General Appearance: healthy, alert, no distress, pleasant affect, cooperative. Marked improvement from 4/19 appt  Eyes:  conjunctivae and corneas clear. PERRL, EOM's intact. Fundi benign.  Mouth: normal.  Neck:  Neck supple. No adenopathy, thyroid symmetric, normal size, no carotid bruits.  Heart:  normal rate and regular rhythm, no murmurs, clicks, or gallops.  Lungs: clear to  auscultation and percussion, no chest deformities noted.  Abdomen: BS normal.  Abdomen soft, non-tender.  No masses or organomegaly.  Extremities:  no cyanosis, clubbing, or edema.  Foot exam, as per Katherine Church, [place], under interdisc Notes.  Recent labs, imaging and chart reviewed.  Results for Katherine, Church (MRN [id number]) as of 12/02/2010 11:13   Ref. Range 11/28/2010 09:00 11/28/2010 13:19   Sodium Latest Range: 136-145 mmol/L 133 (L) 133 (L)   Potassium Latest Range: 3.5-5.1 mmol/L 4.5 4.1   Chloride Latest Range: 98-107 mmol/L 97 (L) 97 (L)   Bicarbonate Latest Range: 22-29 mmol/L 24 23   Bun Latest Range: 6-20 mg/dL 13 11   Creatinine Latest Range: 0.51-0.95 mg/dL 4.40 (L) 3.47 (L)   GFR No range found >60 >60   Glucose Latest Range: 70-115 mg/dL 425 (H) 956 (H)   Glyco Hgb (A1C) Latest Range: 4.8-5.9 % 7.9 (H)    Calcium Latest Range: 8.6-10.5 mg/dL 9.3 9.8   Total Protein Latest Range: 6.0-8.0 g/dL 8.4 (H)    ALBUMIN Latest Range: 3.5-5.2 g/dL 4.8    AST (SGOT) Latest Range: 0-32 U/L 23    ALT (SGPT) Latest Range: 0-33 U/L 30    Alkaline Phos Latest Range: 35-140 U/L 78    Bilirubin, Tot Latest Range: <1.2 mg/dL 0.5    Triglycerides Latest Range: 10-170 mg/dL 387 (H)    Cholesterol Latest Range: <200 mg/dL 564 (H)    HDL-Cholesterol No range found 51    LDL-Chol (Calc) Latest Range: <160 mg/dL 332    Type No range found  Voided   Color Latest Range: Yellow   Yellow   Appearance Latest Range: Clear   Hazy   Specific Gravity Latest Range: 1.002-1.030   1.018   pH Latest Range: 5.0-8.0   6.0   Protein Latest Range: Negative   3+ (A)   Glucose Latest Range: Negative   2+ (A)   Ketones Latest Range: Negative   Small (A)   Leuk Esterase Latest Range: Negative   Negative   Nitrite Latest Range: Negative   Negative   Bilirubin Latest Range: Negative   Negative   Blood Latest Range: Negative   Negative   Urobilinogen Latest Range: 0.2-1 EU/dL   9.5-1.8   WBC Latest Range: 0-2/HPF   3-5 (A)   RBC Latest Range: 0-2/HPF    0-2   Bacteria Latest Range: None-Rare/HPF  Rare   Squam. Epithelial Cell Latest Range: 0-Few/HPF   Many (A)   Mucus Latest Range: None-Rare/HPF   Rare   Creatinine, Urine Latest Range: 29-226 mg/dL 54    Microalbumin Latest Range: <2.0 mg/dL 7.7    Microalbumin/Creat Ratio (Random) Latest Range: 0-30 mcg/mgCr 143 (H)    WBC Latest Range: 4.0-10.0 1000/mm3  12.0 (H)   RBC Latest Range: 3.90-5.20 mill/mm3  5.03   Hgb Latest Range: 11.2-15.7 gm/dL  16.1   Hct Latest Range: 34.0-45.0 %  41.4   MCV Latest Range: 79.0-95.0 um3  82.3   MCH Latest Range: 26.0-32.0 pgm  28.4   MCHC Latest Range: 32.0-36.0 %  34.5   RDW Latest Range: 12.0-14.0 %  13.4   Plt Count Latest Range: 407-453-3494/mm3  251   MPV Latest Range: 9.4-12.4 fL  9.7   Segs Latest Range: 34-71 %  71   Lymphocytes Latest Range: 19-53 %  23   Monocytes Latest Range: 5-12 %  4 (L)   Eosinophils Latest Range: 1-7 %  1   Abs Neutrophils Latest Range: 1.6-7.0 1000/mm3  8.5 (H)   Abs Lymphs Latest Range: 0.8-3.1 1000/mm3  2.7   Abs Monos Latest Range: 0.2-0.8 1000/mm3  0.5   Abs Eosinophils Latest Range: 0.0-0.5 1000/mm3  0.2   Diff Type No range found  Automated     A/P:  Katherine Church was seen today for close FU.    Diagnoses and associated orders for this vis    Diabetes: I am very very encouraged that pt is doing markedly better and BS are improving. I will not change her regimen at this time, but have pt continue w/ hydration and FSBS. Likely multifactorial reasons for feeling so poorly, see below.  - Monofilament exam  - Glucose, POCT  - glipiZIDE (GLIPIZIDE XL) 5 MG CR tablet; Take 1 tablet by mouth daily.  - Diabetic Foot Exam - Monofilament    Viral Syndrome: It is very likely pt was suffering from a viral syndrome, given elev WBC, dehydration, with resultant elev BS and cont'd nausea.None-the-less pt is recovering very nicely, and will follow expectantly.    Hypertension: No evidence of ishemia or volume overload, BP well controlled. Continue with current  management.  - carvedilol (COREG) 25 MG tablet; Take 1 tablet by mouth 2 times daily. In addition to her BP regimen.     Pain from colitis:stable, Continue w/ conservative management and symptomatic treatment.  - traMADol (ULTRAM) 50 MG tablet; Take 1 tablet by mouth every 8 hours as needed for Moderate Pain (Pain Score 4-6).    Abnormal laboratory test: elev WBC likely related to viral syndrome. Will recheck labs before eval.     - CBC with Differential Lavender  - Total Protein, Blood Green Plasma Separator Tube    Other Orders  - Cancel: Referral for dilated exam-pt states recent opthal exam.          Follow-up in 1 month(s) for close follow up of DM, labs with Katherine Church.  Patient Instruction:  See Patient Education/Instruction section.    Patient was educated on clinical, laboratory and imaging findings.   Barriers to Learning assessed: none. All of the patients questions were answered to patient's satisfaction. Patient verbalizes understanding of teaching and instructions and is agreeable to above plan.

## 2010-12-02 NOTE — Patient Instructions (Addendum)
Please continue with your current medications and blood sugar readings.     Please get your labs done today.    If you have any questions please do not hesitate to call me at [phone].    Thank you.

## 2010-12-02 NOTE — Telephone Encounter (Signed)
Appointment today: 12/02/2010 10:30 AM Ileene Rubens, MD [facility]

## 2010-12-02 NOTE — Interdisciplinary (Signed)
Monofilament exam normal no ulcers, dryness. Skin clear on foot. Normal sensation present. Heel of both feet with scratches from pt using pumish stone aggressively. Denies pain or discomfort.

## 2010-12-04 ENCOUNTER — Telehealth (INDEPENDENT_AMBULATORY_CARE_PROVIDER_SITE_OTHER): Payer: Self-pay | Admitting: Neurology

## 2010-12-04 NOTE — Telephone Encounter (Signed)
Thank you, OK to give labs, As

## 2010-12-04 NOTE — Telephone Encounter (Signed)
Verbally confirmed name of Primary Care Provider: yes    What is the test: Blood test     Who ordered the test? Dr. Ileene Rubens     Is the ordering physician a practitioner of this clinic? No: referred patient to the ordering physicians office for results.

## 2010-12-04 NOTE — Telephone Encounter (Signed)
Pt calling for lab results:  Lab Results   Component Value Date    NA 133 11/28/2010    K 4.1 11/28/2010    CL 97 11/28/2010    BICARB 23 11/28/2010    BUN 11 11/28/2010    CREAT 0.41 11/28/2010    GLU 221 11/28/2010    [place] 9.8 11/28/2010     Lab Results   Component Value Date    WBC 12.1 12/02/2010    RBC 4.72 12/02/2010    HGB 13.9 12/02/2010    HCT 39.8 12/02/2010    MCV 84.3 12/02/2010    MCHC 34.9 12/02/2010    RDW 13.7 12/02/2010    PLT 273 12/02/2010    PLT 219 05/21/2009    MPV 10.7 12/02/2010    SEG 70 12/02/2010    LYMPHS 26 12/02/2010    MONOS 3 12/02/2010    EOS 1 12/02/2010    BASOS 1 09/04/2009

## 2010-12-05 NOTE — Telephone Encounter (Signed)
Noted, a copy of patient's lab results was mailed to listed address. Labs dated 11/28/2010 and  12/02/2010. Closing encounter.

## 2010-12-18 ENCOUNTER — Other Ambulatory Visit (INDEPENDENT_AMBULATORY_CARE_PROVIDER_SITE_OTHER): Payer: Self-pay | Admitting: Neurology

## 2010-12-18 NOTE — Telephone Encounter (Signed)
Pended refill(s) and routing to MD for review and approval   Lab Results   Component Value Date    BUN 11 11/28/2010    CREAT 0.41 11/28/2010    CL 97 11/28/2010    NA 133 11/28/2010    K 4.1 11/28/2010    [place] 9.8 11/28/2010    TBILI 0.5 11/28/2010    ALB 4.8 11/28/2010    TP 8.3 12/02/2010    AST 23 11/28/2010    ALK 78 11/28/2010    BICARB 23 11/28/2010    ALT 30 11/28/2010    GLU 221 11/28/2010

## 2010-12-18 NOTE — Telephone Encounter (Signed)
Prescription Refill Request Received via fax    Last visit Date: 4.23.12  Last Refill request per fax: 2.8.12  Next OV:  NONE     Medications:   1. amLODIPINE (NORVASC) 10 MG tablet       Name of Pharmacy in patients demographics? Yes   If not please update patient's pharmacy demographics.

## 2010-12-20 MED ORDER — AMLODIPINE 10 MG OR TABS
10.0000 mg | ORAL_TABLET | Freq: Every day | ORAL | Status: DC
Start: 2010-12-18 — End: 2018-06-04

## 2010-12-20 NOTE — Telephone Encounter (Signed)
Fwd to request to Attending MD, as no response from Resident.

## 2010-12-20 NOTE — Telephone Encounter (Signed)
Pt is calling requesting status on the medication below, pt is completely out and can't go w/ out medication.

## 2010-12-20 NOTE — Telephone Encounter (Signed)
Call pt hm; inform Rx for Norvasc ordered to pharmacy.    Pt verbalize understanding; very appreciative.  Will close encounter.

## 2010-12-25 ENCOUNTER — Other Ambulatory Visit (INDEPENDENT_AMBULATORY_CARE_PROVIDER_SITE_OTHER): Payer: Self-pay | Admitting: Neurology

## 2010-12-25 ENCOUNTER — Telehealth (INDEPENDENT_AMBULATORY_CARE_PROVIDER_SITE_OTHER): Payer: Self-pay | Admitting: Neurology

## 2010-12-25 NOTE — Telephone Encounter (Signed)
Verbally confirmed name of Primary Care Provider: yes    What is the test: Labs 4/23     Pt was told results were mailed to home address on 4/26 but has not received as of today. Please call pt with results. Thank you.     Who ordered the test? Dr. Ileene Rubens    Is the ordering physician a practitioner of this clinic? Yes. Has it been greater than 2 weeks since the study was completed: Yes:  Our apologies for the delay. I will forward the message to the support staff of your provider for resolution

## 2010-12-25 NOTE — Telephone Encounter (Signed)
Prescription Refill Request Received via fax    Last visit Date: 4.23.12  Last Refill request per fax: 4.20.12  Next OV:  NONE     Medications:   1. promethazine (PHENERGAN) 12.5 MG tablet      Last fill. 4.20.12   2.  traMADol (ULTRAM) 50 MG tablet     Last fill. 4.23.12        Name of Pharmacy in patients demographics? Yes   If not please update patient's pharmacy demographics.

## 2010-12-25 NOTE — Telephone Encounter (Signed)
Please add Lisinopril 40 mg tablet to refill below. Last fill date 11/13/2010

## 2010-12-26 NOTE — Telephone Encounter (Signed)
Pended and routed to Dr.Greenfield for review.

## 2010-12-27 MED ORDER — LISINOPRIL 40 MG OR TABS
40.0000 mg | ORAL_TABLET | Freq: Every day | ORAL | Status: DC
Start: 2010-12-26 — End: 2011-01-10

## 2010-12-27 NOTE — Telephone Encounter (Signed)
Pt very upset she hadn't heard anything since the 26th and now she's out of her phenergan and Tramadol. Still not feeling 100%. Doesn't understand why took so long to get results?. Apologized and informed pt that encounters were sent to physician. Pt stated she took extra of the Ultram as her pain was severe and when she went to ED they gave her nothing. She will have labs rechecked on Monday. Also informed pt if her symptoms are persisting to please schedule office visit to further discuss and ED protocol given.

## 2010-12-27 NOTE — Telephone Encounter (Signed)
Routing to attending Dr. Lonzo Cloud for results to call to pt please thank you'  Lab Results   Component Value Date    BUN 11 11/28/2010    CREAT 0.41 11/28/2010    CL 97 11/28/2010    NA 133 11/28/2010    K 4.1 11/28/2010    [place] 9.8 11/28/2010    TBILI 0.5 11/28/2010    ALB 4.8 11/28/2010    TP 8.3 12/02/2010    AST 23 11/28/2010    ALK 78 11/28/2010    BICARB 23 11/28/2010    ALT 30 11/28/2010    GLU 221 11/28/2010     Lab Results   Component Value Date    A1C 7.9 11/28/2010     Lab Results   Component Value Date    CHOL 266 11/28/2010    HDL 51 11/28/2010    LDLCALC 153 11/28/2010    TRIG 312 11/28/2010     Lab Results   Component Value Date    WBC 12.1 12/02/2010    RBC 4.72 12/02/2010    HGB 13.9 12/02/2010    HCT 39.8 12/02/2010    MCV 84.3 12/02/2010    MCHC 34.9 12/02/2010    RDW 13.7 12/02/2010    PLT 273 12/02/2010    PLT 219 05/21/2009    MPV 10.7 12/02/2010     Lab Results   Component Value Date    AST 23 11/28/2010    ALT 30 11/28/2010    ALK 78 11/28/2010    TP 8.3 12/02/2010    ALB 4.8 11/28/2010    TBILI 0.5 11/28/2010    DBILI 0.3 09/03/2009

## 2010-12-27 NOTE — Telephone Encounter (Signed)
Rerouting to Dr. Robin Searing for approval

## 2010-12-27 NOTE — Telephone Encounter (Signed)
Please let her know her WBC is a little high consistent with a mild infection as was her protein level.  She can recheck them in 1-2 weeks to see if resolved.

## 2010-12-27 NOTE — Telephone Encounter (Signed)
Hi per her note she was supposed to follow-up in 1 month.  Please have her schedule appt also.

## 2010-12-30 MED ORDER — PROMETHAZINE HCL 12.5 MG OR TABS
12.5000 mg | ORAL_TABLET | Freq: Two times a day (BID) | ORAL | Status: DC | PRN
Start: 2010-12-30 — End: 2011-02-27

## 2010-12-30 MED ORDER — TRAMADOL HCL 50 MG OR TABS
50.0000 mg | ORAL_TABLET | Freq: Three times a day (TID) | ORAL | Status: DC | PRN
Start: 2010-12-30 — End: 2011-01-21

## 2010-12-30 NOTE — Telephone Encounter (Signed)
Approved.    Please close encounter when done, thank you.AS

## 2010-12-30 NOTE — Telephone Encounter (Signed)
Pt called upset her meds have  Not been   Approved   As of yet and pt went weekend without as well. Please approve  Lab Results   Component Value Date    BUN 11 11/28/2010    CREAT 0.41 11/28/2010    CL 97 11/28/2010    NA 133 11/28/2010    K 4.1 11/28/2010    [place] 9.8 11/28/2010    TBILI 0.5 11/28/2010    ALB 4.8 11/28/2010    TP 8.3 12/02/2010    AST 23 11/28/2010    ALK 78 11/28/2010    BICARB 23 11/28/2010    ALT 30 11/28/2010    GLU 221 11/28/2010

## 2010-12-30 NOTE — Telephone Encounter (Signed)
appt made already and pt very upset and demanding approval now!. Routing to Dr. Ileene Rubens for approval

## 2011-01-01 ENCOUNTER — Encounter (INDEPENDENT_AMBULATORY_CARE_PROVIDER_SITE_OTHER): Payer: Self-pay | Admitting: Internal Medicine

## 2011-01-01 ENCOUNTER — Other Ambulatory Visit (INDEPENDENT_AMBULATORY_CARE_PROVIDER_SITE_OTHER)

## 2011-01-01 ENCOUNTER — Other Ambulatory Visit (INDEPENDENT_AMBULATORY_CARE_PROVIDER_SITE_OTHER): Payer: Self-pay | Admitting: Neurology

## 2011-01-02 NOTE — Telephone Encounter (Signed)
Please close when done.

## 2011-01-07 ENCOUNTER — Encounter (INDEPENDENT_AMBULATORY_CARE_PROVIDER_SITE_OTHER): Admitting: Internal Medicine

## 2011-01-08 ENCOUNTER — Encounter (INDEPENDENT_AMBULATORY_CARE_PROVIDER_SITE_OTHER): Admitting: Internal Medicine

## 2011-01-09 ENCOUNTER — Telehealth (INDEPENDENT_AMBULATORY_CARE_PROVIDER_SITE_OTHER): Payer: Self-pay | Admitting: Neurology

## 2011-01-09 NOTE — Telephone Encounter (Signed)
Called pt and she states that she has been taking lisinopril 40mg  1 po twice daily since awhile now and pharmacy says that instructions state once daily? Please clarify ASAP pt out of refills and has couple pills left.

## 2011-01-09 NOTE — Telephone Encounter (Signed)
Patient needs clarification on the dosage of her lisinopril medication. She states she supposed to take 2 tab daily and pharmacy has been dispensing 30 qty 1 tab daily.   Patient states she needs to clarify this asap.   Please call patient at [phone]

## 2011-01-10 MED ORDER — LISINOPRIL 40 MG OR TABS
40.0000 mg | ORAL_TABLET | Freq: Every day | ORAL | Status: DC
Start: 2011-01-10 — End: 2011-01-21

## 2011-01-10 NOTE — Telephone Encounter (Signed)
Done and nurse will f/up

## 2011-01-10 NOTE — Telephone Encounter (Signed)
Pt requesting status on refill for Lisinopril. Please send to [facility] [address]. Pt states she will be out by 6/4.

## 2011-01-14 ENCOUNTER — Telehealth (INDEPENDENT_AMBULATORY_CARE_PROVIDER_SITE_OTHER): Payer: Self-pay | Admitting: Internal Medicine

## 2011-01-14 NOTE — Telephone Encounter (Signed)
Letter sent on Dec 27, 2010 by Dr. Ileene Rubens was returned to office " Return to sender-attempted-not known unable to forward"

## 2011-01-21 ENCOUNTER — Encounter (INDEPENDENT_AMBULATORY_CARE_PROVIDER_SITE_OTHER): Payer: Self-pay | Admitting: Internal Medicine

## 2011-01-21 ENCOUNTER — Ambulatory Visit (INDEPENDENT_AMBULATORY_CARE_PROVIDER_SITE_OTHER): Admitting: Internal Medicine

## 2011-01-21 VITALS — BP 136/94 | HR 72 | Temp 98.4°F | Resp 16 | Ht 64.0 in

## 2011-01-21 MED ORDER — LISINOPRIL 40 MG OR TABS
40.0000 mg | ORAL_TABLET | Freq: Two times a day (BID) | ORAL | Status: DC
Start: 2011-01-21 — End: 2011-04-23

## 2011-01-21 MED ORDER — TRAMADOL HCL 50 MG OR TABS
100.0000 mg | ORAL_TABLET | Freq: Three times a day (TID) | ORAL | Status: DC | PRN
Start: 2011-01-21 — End: 2011-02-13

## 2011-01-21 MED ORDER — CLONIDINE HCL 0.3 MG OR TABS
0.3000 mg | ORAL_TABLET | Freq: Three times a day (TID) | ORAL | Status: DC
Start: 2011-01-21 — End: 2012-10-10

## 2011-01-21 NOTE — Progress Notes (Signed)
ATTENDING NOTE:    Chief Complaint   Patient presents with   . Other     follow up med; labs       SUBJECTIVE:   I reviewed the history and interviewed the patient.    History of present illness (HPI):  Katherine Church is a 45 year old female who presents for follow-up htn, dm.    Review of Systems (ROS): As per  the resident's note.    Past Medical, Family, Social History:  Reviewed and updated as per resident note.    Current Outpatient Prescriptions   Medication Sig   . clonidine (CATAPRES) 0.3 MG tablet Take 1 tablet by mouth 3 times daily.   . traMADol (ULTRAM) 50 MG tablet Take 2 tablets by mouth every 8 hours as needed for Moderate Pain (Pain Score 4-6).   Marland Kitchen lisinopril (PRINIVIL, ZESTRIL) 40 MG tablet Take 1 tablet by mouth 2 times daily.   Marland Kitchen DISCONTD: lisinopril (PRINIVIL, ZESTRIL) 40 MG tablet Take 1 tablet by mouth daily.   . promethazine (PHENERGAN) 12.5 MG tablet Take 1 tablet by mouth every 12 hours as needed for Nausea.   Marland Kitchen DISCONTD: traMADol (ULTRAM) 50 MG tablet Take 1 tablet by mouth every 8 hours as needed for Moderate Pain (Pain Score 4-6).   Marland Kitchen amLODIPINE (NORVASC) 10 MG tablet Take 1 tablet by mouth daily.   . carvedilol (COREG) 25 MG tablet Take 1 tablet by mouth 2 times daily.   Marland Kitchen glipiZIDE (GLIPIZIDE XL) 5 MG CR tablet Take 1 tablet by mouth daily.   . metoprolol tartrate (LOPRESSOR) 50 MG tablet Take 1 tablet by mouth 2 times daily.   Marland Kitchen docusate sodium (COLACE) 100 MG capsule Take 2 capsules by mouth 2 times daily.   Marland Kitchen DISCONTD: clonidine (CATAPRES) 0.3 MG tablet Take 1 tablet by mouth 3 times daily.       OBJECTIVE: BP 136/94  Pulse 72  Temp(Src) 98.4 F (36.9 C) (Oral)  Resp 16  Ht 5\' 4"  (1.626 m)  LMP 12/25/2010    I have examined the patient and I concur with the resident's exam.    MEDICAL PLAN of CARE:   Assessment and plan reviewed with the resident physician.  I agree with the resident's plan as documented.    See the resident's note for further details.

## 2011-01-21 NOTE — Progress Notes (Signed)
History of Present Illness  45 year old female here today for follow up of multiple complaints:    * Sciatica: chronic, stable, requesting tramadol be increased to 100mg  q8hr  * DM: FSG increased in April in the setting of noncompliance with glipizide. Now taking medication with FSG 70-120s  * Anxiety: now off daily valium (most anxitery issues related to finding her father deceased in his [place] home)  * HTN: home SBP 120-130s    Exam  BP 136/94  Pulse 72  Temp(Src) 98.4 F (36.9 C) (Oral)  Resp 16  Ht 5\' 4"  (1.626 m)  LMP 12/25/2010  General: pleasant female in no acute distress, but with liability of mood, particularly when discussing upcoming trip back home   HEENT: OP clear without erythema  CV: regular, no murmur appreciated  Pulm: clear bilaterally  Abd: benign  Extr: no edema, + straight leg on right    ASSESSMENT AND PLAN    Hypertension: at goal today  - Continue carvedilol to 25 MG BID, lisinopril 40 BID, norvasc 10 days, clinidine 0.3 TID    Diabetes: tolerating glipizide (previously intolerant to metformin)  -     Recheck A1C today    Pain: chronic back stable, but currently taking too much ibuprofen (800 TID), especially given her 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    Healthcare Maintenance:  -    Check lipid  -    Ordered mammogram    Patient discussed with Dr Lonzo Cloud, attending physician

## 2011-01-29 ENCOUNTER — Telehealth (INDEPENDENT_AMBULATORY_CARE_PROVIDER_SITE_OTHER): Payer: Self-pay | Admitting: Neurology

## 2011-01-29 NOTE — Telephone Encounter (Signed)
Routing to resident nurse Manuella Ghazi for follow-up

## 2011-01-29 NOTE — Telephone Encounter (Signed)
Pt would like nurse to call [facility] to confirm the directions for Lisinopril 40 mg.

## 2011-01-30 NOTE — Telephone Encounter (Signed)
Placed call to pharmacy. Pharmacy stated they have been trying to get verification for a month now. Verified sig for Lisinopril. Closing

## 2011-01-30 NOTE — Telephone Encounter (Signed)
Pt states directions on medication LISINOPRIL should be 1 tab twice a day. Please call [facility] pharmacy to verify medication.

## 2011-02-13 ENCOUNTER — Other Ambulatory Visit (INDEPENDENT_AMBULATORY_CARE_PROVIDER_SITE_OTHER): Payer: Self-pay | Admitting: Neurology

## 2011-02-13 NOTE — Telephone Encounter (Signed)
Last refill 01/21/11 pt is a few days early. Routing to Dr Robin Searing for review

## 2011-02-13 NOTE — Telephone Encounter (Signed)
Prescription Refill Request Received via fax    Last visit Date: 6.12.12  Last Refill request per fax: 5.21.12  Next OV:  NONE     Medications:   TRAMADOL HCL 50 MG OR TABS      Name of Pharmacy in patients demographics? Yes   If not please update patient's pharmacy demographics.

## 2011-02-14 MED ORDER — TRAMADOL HCL 50 MG OR TABS
100.0000 mg | ORAL_TABLET | Freq: Three times a day (TID) | ORAL | Status: DC | PRN
Start: 2011-02-20 — End: 2011-03-17

## 2011-02-14 NOTE — Telephone Encounter (Signed)
Called pt told her the refill won't be ready until 02/20/11. Pt stated she just wanted to be sure she called it in early enough. Pt stated she still has some left. She will pick up 02/20/11.

## 2011-02-14 NOTE — Telephone Encounter (Signed)
Routing to Dr Dionisio David d/t Dr Robin Searing has left.

## 2011-02-14 NOTE — Telephone Encounter (Signed)
Last refill ends on 02/20/11 she received 30 days supply, new refill will be available on 7/12.

## 2011-02-26 ENCOUNTER — Other Ambulatory Visit (INDEPENDENT_AMBULATORY_CARE_PROVIDER_SITE_OTHER): Payer: Self-pay | Admitting: Neurology

## 2011-02-26 NOTE — Telephone Encounter (Signed)
Prescription Refill Request Received via fax    Last visit Date: 6.12.12  Last Refill request per fax: 5.21.12  Next OV:  NONE     Medications:   promethazine (PHENERGAN) 12.5 MG tablet       Name of Pharmacy in patients demographics? Yes   If not please update patient's pharmacy demographics.

## 2011-02-27 MED ORDER — PROMETHAZINE HCL 12.5 MG OR TABS
12.5000 mg | ORAL_TABLET | Freq: Two times a day (BID) | ORAL | Status: DC | PRN
Start: 2011-02-27 — End: 2011-03-20

## 2011-02-27 NOTE — Telephone Encounter (Signed)
Last refilled: 12/30/10 disp.#45x0 refills  Recent ED visits: 11/28/10 dx: nausea  Comment: routing to Dr. Ileene Rubens whom saw pt. On 12/02/10.  Dr. Robin Searing & Dr. Laureen Ochs no longer Residents.  Lab Results   Component Value Date    NA 133 11/28/2010    K 4.1 11/28/2010    CL 97 11/28/2010    BICARB 23 11/28/2010    BUN 11 11/28/2010    CREAT 0.41 11/28/2010    GLU 221 11/28/2010    [place] 9.8 11/28/2010

## 2011-03-06 ENCOUNTER — Telehealth (INDEPENDENT_AMBULATORY_CARE_PROVIDER_SITE_OTHER): Payer: Self-pay | Admitting: Neurology

## 2011-03-06 ENCOUNTER — Ambulatory Visit (INDEPENDENT_AMBULATORY_CARE_PROVIDER_SITE_OTHER): Admitting: Internal Medicine

## 2011-03-06 ENCOUNTER — Encounter (INDEPENDENT_AMBULATORY_CARE_PROVIDER_SITE_OTHER): Payer: Self-pay | Admitting: Internal Medicine

## 2011-03-06 ENCOUNTER — Encounter (HOSPITAL_COMMUNITY): Payer: Self-pay

## 2011-03-06 ENCOUNTER — Emergency Department
Admit: 2011-03-06 | Discharge: 2011-03-07 | Disposition: A | Discharge status: Home Health | Payer: Self-pay | Attending: Emergency Medicine | Admitting: Emergency Medicine

## 2011-03-06 VITALS — BP 128/100 | HR 76 | Temp 98.1°F | Resp 12 | Ht 64.0 in | Wt 177.0 lb

## 2011-03-06 MED ORDER — HYDROMORPHONE HCL 1 MG/ML IJ SOLN
1.0000 mg | Freq: Once | INTRAMUSCULAR | Status: AC
Start: 2011-03-06 — End: 2011-03-06
  Filled 2011-03-06: qty 1

## 2011-03-06 MED ORDER — HYDROMORPHONE HCL 2 MG/ML IJ SOLN
1.5000 mg | Freq: Once | INTRAMUSCULAR | Status: AC
Start: 2011-03-06 — End: 2011-03-06
  Filled 2011-03-06: qty 1

## 2011-03-06 MED ORDER — ONDANSETRON HCL 4 MG/2ML IV SOLN
8.0000 mg | Freq: Once | INTRAMUSCULAR | Status: AC
Start: 2011-03-06 — End: 2011-03-06
  Filled 2011-03-06: qty 4

## 2011-03-06 MED ORDER — HYDROMORPHONE HCL 2 MG/ML IJ SOLN
1.5000 mg | Freq: Once | INTRAMUSCULAR | Status: AC
Start: 2011-03-06 — End: 2011-03-06
  Administered 2011-03-06: 1.5 mg via INTRAVENOUS
  Filled 2011-03-06: qty 1

## 2011-03-06 NOTE — ED Notes (Signed)
Pt to CT

## 2011-03-06 NOTE — Telephone Encounter (Signed)
Symptoms: "pain on right hand side" "thought it was muscular"  Onset: Tuesday 7/17  Location: right lower quadrant, "from hip bone to ovary"  Duration: lasts all day  Characteristic: "dull". 5/10 sometimes 7/10  Aggravating: movement, bending to my right side   Relieving: "nothing:  Treatments: has taken phenergan and tramadol with no relief of symptoms.   Hx of tubal ligation    Reports she has been nauseated. Last BM yesterday, usually constipated. Had BM with what appeared to be "jelly" in her stool.   Denies: hx of colitis, recent travel, new food, severe pain 10/10, pain with palpation, diaphoresis, v/, fever or chills, blood in urine    Scheduled: Today - 03/06/2011 10:30 AM Ileene Rubens, MD [facility]

## 2011-03-06 NOTE — ED Notes (Signed)
Pt resting with headphones in. C/o slight agitation as ER stay has been longer than anticipated. C/o continued pain, but states pain is tolerable.     Awaiting [place].

## 2011-03-06 NOTE — Patient Instructions (Signed)
Go directly to the ED, they are expecting you.  If you have any questions please do not hesitate to call me at [phone].    Thank you.

## 2011-03-06 NOTE — ED Attending Note (Signed)
pt seen and examined.  here as noted above with rlq pain.  Pt was seen in clinic this am and sent here for possible app.  Has had pain for the past 4 days and has ahd some loss of appetite and n.  pe  pt in nad  vss, o2 sat nl on ra  heent - grossly unremarkable  neck - supple, no nodes, no meningismus  lungs - clear bilat  card - rrr, no m, r, g  abdom - soft, tender in rlq, no masses, no peritonitis. bs present  extrem - no rashes, no lesions, no cyanosis.  neuro - nonfocal  case discussed with res, agree with assessment and plan.  Will need ct for appy and possible surgical consult.

## 2011-03-06 NOTE — ED Provider Notes (Addendum)
History  Chief Complaint   Patient presents with   . Abdominal Pain     lrq pain  w n/v   sent from PMD office for ct scan  to r/o appy     no fevers     HPI  45 y.o. female h/o the piriformis syndrome, myofascial pain chronic back pain, anxiety, acute ischemic colitis, hypertension, diabetes referred from from primary care provider for evaluation of abdominal pain. States that Tuesday, she awoke with RLQ pain that is dull and sharp in quality. States associated nausea and decrease in appetite. LBM this morning. Denies f, v, BRBPR, UTI sxs, URI sxs.     Past Medical History   Diagnosis Date   . Back pain    . High blood pressure    . Diabetes mellitus        Past Surgical History   Procedure Date   . Anesth,tubal ligation/transection 1994       Family History   Problem Relation Age of Onset   . Diabetes Father    . Lipids Father    . Hypertension Father    . Hypertension Mother        History   Substance Use Topics   . Smoking status: Never Smoker    . Smokeless tobacco: Never Used   . Alcohol Use: No       Review of Systems    Physical Exam  BP 151/99  Pulse 86  Temp 98.3 F (36.8 C)  Resp 16  SpO2 99%  LMP 01/21/2011    Physical Exam   Vitals reviewed.  Constitutional: She is oriented to person, place, and time. She appears well-developed and well-nourished. She appears distressed.   HENT:   Head: Normocephalic and atraumatic.   Eyes: EOM are normal. Pupils are equal, round, and reactive to light.   Neck: Normal range of motion.   Cardiovascular: Normal rate, regular rhythm, normal heart sounds and intact distal pulses.    Pulmonary/Chest: Effort normal and breath sounds normal. No respiratory distress. She has no wheezes. She exhibits no tenderness.   Abdominal: Soft. Bowel sounds are normal. There is tenderness.        Tenderness over McBurney's point.   Genitourinary: Vagina normal. Guaiac negative stool. No vaginal discharge found.        Pelvic exam: No CMT, vaginal discharge. Os closed. +right adnexal  tenderness.   Musculoskeletal: Normal range of motion.   Neurological: She is alert and oriented to person, place, and time. No cranial nerve deficit. Coordination normal.   Skin: Skin is warm and dry.   Psychiatric: She has a normal mood and affect. Her behavior is normal. Judgment and thought content normal.       ED Course/Medical Decision Making Narrative  Referred by PMD for evaluation of c/f appendicitis, however CT abd negative, and pt afebrile and WBC wnl.Pelvic u/s negative for torsion. D/w patient surgical evaluation and possible admission for serial abdominal exams, however, pt wanting to be discharged. Workup positive for BV. Pain well controlled with IV dilaudid. States she will f/u with PMD tomorrow and understands return precautions.          Critical Care Time            Additional Notes    Marlan Palau, MD  Resident  03/07/11 Dot Been, MD  Resident  03/07/11 [phone]

## 2011-03-06 NOTE — ED Notes (Signed)
SIGN OUT FROM DR Koleen Nimrod  45 yo female transferred from pcp for eval of appy, CT neg, ttp in rlq, anorexia, nausea, but afebrile, normal wbc    -pelvic u/s for torsion pending -> if neg, surgery need to eval patient-, possible admit for pain control and serial exams if surgery does not take to OR    Mardi Mainland, MD  Resident  03/06/11 [phone]

## 2011-03-06 NOTE — ED Notes (Signed)
2244 - pt to [place] via wheelchair.  2316 - pt back from [place].

## 2011-03-06 NOTE — ED Notes (Signed)
Emergency Medicine Attending Change of Shift Note    Assumed care for patient at change of shift. Attending level sign-out from [facility] who was the primary caregiver for this patient during the ED stay. The plan of care and disposition is as follows:    Latest vitals:  Filed Vitals:    03/06/11 2228   BP: 155/70   Pulse: 86   Temp:    Resp: 16   SpO2: 99%       Pt is a 45 year old yo female here with rlq pain, ct abd neg, pelvic u/s pending. Will likely need admit for pain control, poss surgery consultation for serial abd exams    Further management will be expectant.  Please see additional MD notes for more details.       Rowland Lathe, MD  03/06/11 [phone]

## 2011-03-06 NOTE — ED Notes (Signed)
Pelvic exam done by MD, RN at bedside. Pt tol well.

## 2011-03-06 NOTE — ED Notes (Signed)
Returned from [place]. Pt c/o continued pain. Will notify ERMD.

## 2011-03-06 NOTE — ED Notes (Signed)
Pt to [place] c female tech via gurney.

## 2011-03-06 NOTE — ED Notes (Signed)
Pt helped to BR.

## 2011-03-06 NOTE — ED Notes (Signed)
Pt requesting to take own PO 0.3mg  Clonidine.

## 2011-03-06 NOTE — Progress Notes (Signed)
 Chief Complaint   Patient presents with   . Abdominal Pain     began Tue 7/24; pain meds not helping   . Other     Hx colitis; chronic constipation; BM with brownish/green mucus X 1 week       SUBJECTIVE::Katherine Church is a 45 year old female here for acute issue as above.   See telephone encounter:  Georgeanna Lea., RN 03/06/2011 9:13 AM Signed   Symptoms: "pain on right hand side" "thought it was muscular"   Onset: Tuesday 7/17   Location: right lower quadrant, "from hip bone to ovary"   Duration: lasts all day   Characteristic: "dull". 5/10 sometimes 7/10   Aggravating: movement, bending to my right side   Relieving: "nothing:   Treatments: has taken phenergan and tramadol with no relief of symptoms.   Hx of tubal ligation   Reports she has been nauseated. Last BM yesterday, usually constipated. Had BM with what appeared to be "jelly" in her stool.   Denies: hx of colitis, recent travel, new food, severe pain 10/10, pain with palpation, diaphoresis, v/, fever or chills, blood in urine   Scheduled: Today - 03/06/2011 10:30 AM Ileene Rubens, MD [facility]    Please see last note in Epic, 01/21/11  -------------------  2 d ago, awoke, pain, R LQ, pain with any type of movement. No help with tramadol and advil. Pain, 8-10/10. Does not feel as her abdomen is tender, as is painful with movement. She describes bending, twisting, and hip flexion on the most painful . +Chills/sweats. Nausea this am, last time ate, yesterday at 6 :30 light, bland diet. Anorexic.  Last BM, this AM, constipated. Green mucous around stool.  Still has appendix.  Could not sleep well, could not get comfortable. Today she feels like she cannot get into a comfortable position.    Denies vaginal dc, spotting.   Patient Active Problem List   Diagnoses Date Noted   . Myofascial pain 10/07/2010   . Piriformis syndrome 10/07/2010   . Screening for breast cancer 03/02/2010   . Screening for malignant neoplasm of breast 09/14/2009    . Acute ischemic colitis 09/08/2009   . Abdominal pain 09/02/2009   . Hypertension, malignant 05/22/2009   . Retinal hemorrhage 05/22/2009   . Diabetes 05/18/2009   . Hypertension 05/18/2009   . Chronic back pain 05/18/2009   . Anxiety 05/18/2009     Past Medical History   Diagnosis Date   . Back pain    . High blood pressure    . Diabetes mellitus        Social History:  History   Substance Use Topics   . Smoking status: Never Smoker    . Smokeless tobacco: Never Used   . Alcohol Use: No       Allergies:  Allergies   Allergen Reactions   . Tetanus Toxoids Swelling       No current facility-administered medications for this visit.     Current Outpatient Prescriptions   Medication Sig   . promethazine (PHENERGAN) 12.5 MG tablet Take 1 tablet by mouth every 12 hours as needed for Nausea.   . traMADol (ULTRAM) 50 MG tablet Take 2 tablets by mouth every 8 hours as needed for Moderate Pain (Pain Score 4-6).   . clonidine (CATAPRES) 0.3 MG tablet Take 1 tablet by mouth 3 times daily.   Marland Kitchen lisinopril (PRINIVIL, ZESTRIL) 40 MG tablet Take 1 tablet by mouth 2  times daily.   Marland Kitchen amLODIPINE (NORVASC) 10 MG tablet Take 1 tablet by mouth daily.   . carvedilol (COREG) 25 MG tablet Take 1 tablet by mouth 2 times daily.   Marland Kitchen glipiZIDE (GLIPIZIDE XL) 5 MG CR tablet Take 1 tablet by mouth daily.   Marland Kitchen docusate sodium (COLACE) 100 MG capsule Take 2 capsules by mouth 2 times daily.   Marland Kitchen DISCONTD: metoprolol tartrate (LOPRESSOR) 50 MG tablet Take 1 tablet by mouth 2 times daily.     Facility-Administered Medications Ordered in Other Visits   Medication   . HYDROmorphone (DILAUDID) injection 1 mg   . ondansetron (ZOFRAN) injection 8 mg   . HYDROmorphone (DILAUDID) injection 1.5 mg   . HYDROmorphone (DILAUDID) injection 1.5 mg   . ondansetron (ZOFRAN) injection 8 mg       ROS:12 point review of systems, reviewed and negative except as per HPI and PMHx.      OBJECTIVE:: BP 128/100  Pulse 76  Temp(Src) 98.1 F (36.7 C) (Oral)  Resp 12  Ht  5\' 4"  (1.626 m)  Wt 80.287 kg (177 lb)  BMI 30.38 kg/m2  LMP 01/21/2011  General Appearance: healthy, alert, moderate distress, cooperative.  Heart:  normal rate and regular rhythm, no murmurs, clicks, or gallops.  Lungs: clear to auscultation and percussion, no chest deformities noted.  Abdomen: Difficult for pt to get comfortable. BS normal.  Abdomen, + guarding, TTP RLQ with deep palpation with radiation up R flank, no peritoneal signs, or rebound.     No masses or organomegaly.  Pelvic Exam - negative findings: external genitalia normal, Bartholin's glands, urethra, Skene's glands negative, vaginal mucosa normal, cervix , no CMT, normal sized uterus, R adnexal TTP, L adnexae negative, rectovaginal exam: Pain with palpation of RLQ, adnexal.  Extremities:  no cyanosis, clubbing, or edema.    Recent labs, imaging and chart reviewed.  Results for Katherine, Church (MRN [id number]) as of 03/06/2011 11:21   Ref. Range 01/01/2011 15:40 01/21/2011 15:35   Total Protein Latest Range: 6.0-8.0 g/dL 8.1 (H)    Glyco Hgb (J1B) Latest Range: 4.8-5.9 %  7.4 (H)   WBC Latest Range: 4.0-10.0 1000/mm3 10.8 (H)    RBC Latest Range: 3.90-5.20 mill/mm3 4.47    Hgb Latest Range: 11.2-15.7 gm/dL 14.7    Hct Latest Range: 34.0-45.0 % 38.2    MCV Latest Range: 79.0-95.0 um3 85.5    MCH Latest Range: 26.0-32.0 pgm 29.1    MCHC Latest Range: 32.0-36.0 % 34.0    RDW Latest Range: 12.0-14.0 % 13.6    Plt Count Latest Range: [phone]/mm3 254    MPV Latest Range: 9.4-12.4 fL 10.2    Segs Latest Range: 34-71 % 62    Lymphocytes Latest Range: 19-53 % 32    Monocytes Latest Range: 5-12 % 4 (L)    Eosinophils Latest Range: 1-7 % 2    Abs Neutrophils Latest Range: 1.6-7.0 1000/mm3 6.7    Abs Lymphs Latest Range: 0.8-3.1 1000/mm3 3.4 (H)    Abs Monos Latest Range: 0.2-0.8 1000/mm3 0.4    Abs Eosinophils Latest Range: 0.0-0.5 1000/mm3 0.2    Imm Gran Abs Latest Range: 0.0-0.1 1000/mm3 0.1    Imm Gran % Latest Range: 0-1 % 1    Diff Type No  range found Automated      A/P:  Katherine Church was seen today for abdominal pain.    Diagnoses and associated orders for this visit:    Abdominal pain, acute, right lower quadrant:44 yo  F with RLQ, and adnexal pain with nausea and anorexia, obligated to first R/O appendicitis, However, ddx includes ovarian etiology ( ruptured cyst, torsion), nephrolithiasis, infectious etiology and MSK etiology, Will refer to ED for imaging and report called into Dr. Maia Breslow in ED. Re: pt trasport, calling [facility] transport, pt declined, states no insurance and can't afford, will take taxi. Advised heavily against, She has been fully informed of R/BA, pt refused medical transport and has signed AMA, (See hard copy in the chart/Media Manager in EPIC).  - Emergency Department    Elev BP-likely sec to pain. FU in ED.    Follow-up in 2 week(s) to re-establish with new intern.  Patient Instruction:  See Patient Education/Instruction section.    Patient was educated on clinical, laboratory and imaging findings.   Barriers to Learning assessed: none. All of the patients questions were answered to patient's satisfaction. Patient verbalizes understanding of teaching and instructions and is agreeable to above plan.

## 2011-03-06 NOTE — ED Notes (Signed)
[place] paged to find time of film.

## 2011-03-06 NOTE — ED Notes (Signed)
Pt c/o continued pain, appears to be uncomfortable. MD aware, new order noted.

## 2011-03-06 NOTE — Telephone Encounter (Signed)
Verbally confirmed name of Primary Care Provider: yes    What is reason for call: Pt reports to have Nausea and dull lower right pain that doesn't go away. Pt is requesting to speak to triage nurse Judeth Cornfield to discuss these symptoms and determine if an office visit or ED visit is needed.     Confirmed Contact Number:yes    This message will be transmitted to our triage nurse, you can expect a call by the end of the working day.

## 2011-03-07 MED ORDER — OXYCODONE-ACETAMINOPHEN 5-325 MG OR TABS
1.0000 | ORAL_TABLET | Freq: Four times a day (QID) | ORAL | Status: DC | PRN
Start: 2011-03-07 — End: 2011-05-06

## 2011-03-07 MED ORDER — ONDANSETRON HCL 4 MG OR TABS
4.0000 mg | ORAL_TABLET | Freq: Three times a day (TID) | ORAL | Status: DC | PRN
Start: 2011-03-07 — End: 2011-05-06

## 2011-03-07 MED ORDER — METRONIDAZOLE 500 MG OR TABS
500.0000 mg | ORAL_TABLET | Freq: Two times a day (BID) | ORAL | Status: AC
Start: 2011-03-07 — End: 2011-03-14

## 2011-03-07 MED ORDER — LISINOPRIL 10 MG OR TABS
40.0000 mg | ORAL_TABLET | Freq: Once | ORAL | Status: DC
Start: 2011-03-07 — End: 2011-03-07
  Filled 2011-03-07: qty 1

## 2011-03-07 MED ORDER — OXYCODONE-ACETAMINOPHEN 5-325 MG OR TABS
1.0000 | ORAL_TABLET | Freq: Once | ORAL | Status: AC
Start: 2011-03-07 — End: 2011-03-07
  Filled 2011-03-07: qty 1

## 2011-03-07 MED ORDER — HYDROMORPHONE HCL 2 MG/ML IJ SOLN
1.5000 mg | Freq: Once | INTRAMUSCULAR | Status: AC
Start: 2011-03-07 — End: 2011-03-07
  Filled 2011-03-07: qty 1

## 2011-03-07 MED ORDER — CARVEDILOL 25 MG OR TABS
25.0000 mg | ORAL_TABLET | Freq: Once | ORAL | Status: AC
Start: 2011-03-07 — End: 2011-03-07
  Administered 2011-03-07: 50 mg via ORAL
  Filled 2011-03-07: qty 1

## 2011-03-07 MED ORDER — LISINOPRIL 10 MG OR TABS
40.0000 mg | ORAL_TABLET | Freq: Once | ORAL | Status: AC
Start: 2011-03-07 — End: 2011-03-07
  Filled 2011-03-07: qty 4

## 2011-03-07 NOTE — Discharge Instructions (Signed)
Abdominal Pain    You have been diagnosed with abdominal (belly) pain. The cause of your pain is not yet known.    Many things can cause abdominal pain. Examples include viral infections and bowel (intestine) spasms. You might need another examination or more tests to find out why you have pain.    At this time, your pain does not seem to be caused by anything dangerous. You do not need surgery. You do not need to stay in the hospital.     Though we don't believe your condition is dangerous right now, it is important to be careful. Sometimes a problem that seems mild can become serious later. This is why it is very important that you return here or go to the nearest Emergency Department unless you are 100% improved.    Return here or go to the nearest Emergency Department, or follow up with your physician in:   24 hours.    Drink only clear liquids such as water, clear broth, sports drinks, or clear caffeine-free soft drinks, like 7-Up or Sprite, for the next:   24 hours.    YOU SHOULD SEEK MEDICAL ATTENTION IMMEDIATELY, EITHER HERE OR AT THE NEAREST EMERGENCY DEPARTMENT, IF ANY OF THE FOLLOWING OCCURS:   Your pain does not go away or gets worse.   You cannot keep fluids down or your vomit is dark green.    You vomit blood or see blood in your stool. Blood might be bright red or dark red. It can also be black and look like tar.   You have a fever or shaking chills.   Your skin or eyes look yellow or your urine looks brown.   You have severe diarrhea.    Vaginitis    You have been diagnosed with vaginitis.    This is an infection that is caused by a bacteria. Symptoms may include vaginal itching or pain, painful urination and abnormal vaginal discharge that sometimes has a bad odor. The diagnosis is made based on symptoms, physical exam, and examination of the discharge under a microscope.    Treatment of vaginitis is with medication to treat the specific type of infection. No other special  treatment or follow-up is needed, unless symptoms fail to improve or your condition worsens.    YOU SHOULD SEEK MEDICAL ATTENTION IMMEDIATELY, EITHER HERE OR AT THE NEAREST EMERGENCY DEPARTMENT, IF ANY OF THE FOLLOWING OCCURS:   Pain in your pelvis or lower abdomen.   Fever or chills.   Worsening discharge or severe irritation of the vagina.      Fibroids of Uterus    You have been diagnosed with Uterine Fibroids.    A fibroid is a benign (NOT cancer) tumor of the uterus. A tumor is an abnormal growth of tissue, in this case of muscle tissue. Fibroids are also called "leiomyomas." They can grow in any one of 3 locations on the uterus: They can be on the outside of the uterus; they can grow on the inside of the uterus; or they can stay inside the muscular wall of the uterus.    Symptoms of uterine fibroids include the following: (not everyone will have all of these symptoms)   More frequent periods with heavier bleeding and more pain than usual.   Bleeding between normal menstrual periods.   Pain or bleeding during sexual intercourse.   The feeling of pressure on the bladder or on the rectum.   Some patients will have no symptoms at all. The  fibroids may be discovered during a pelvic exam.    The exact cause of fibroids is not known, but they may be associated with the use of estrogen. They are more common in African-American women.    Treatment for uterine fibroids depends on the size and location of the tumor and the degree of symptoms that the patient is having.   For mild cases, no treatment is needed and the fibroids can be rechecked in 6-12 months (as determined by your physician on your initial follow-up).   In cases of severe pain and bleeding, surgery to remove the fibroids may be needed.   Sometimes, progesterone-containing contraceptives (birth control pills) can be used to control bleeding.   Pain medications, especially anti-inflammatories such as ibuprofen (Advil, Motrin) or naproxen  (Aleve, Naprosyn) can be used to help with the cramping and discomfort. Use these medications as directed by your physician.    YOU SHOULD SEEK MEDICAL ATTENTION IMMEDIATELY, EITHER HERE OR AT THE NEAREST EMERGENCY DEPARTMENT, IF ANY OF THE FOLLOWING OCCURS:   Increased pelvic pain.   Increased bleeding.   Symptoms of anemia: Lightheadedness, dizziness, easy fatigue, pale skin, and shortness of breath with activity.      FOLLOW UP WITH YOUR PRIMARY DOCTOR IN 24-28HRS. RECOMMEND EVALUATION BY A GYNECOLOGIST AS SOON AS POSSIBLE.

## 2011-03-07 NOTE — ED Notes (Signed)
Sign Out: 44y.o. F p/w RLQ pain, anorexia, & nausea but no fever. CT abd/pelv neg for appendicitis. Vaginosis screen +BV. F/u on pelvic US showed fibroid uterus but no acute ovarian abn. Pt informed of findings. Her BP was slightly elevated on my initial re-exam, but pt was still in pain & had not taken her PM BP meds. After receiving lisinopril, carvedilol, another 1.5mg  dilaudid, & a percocet pt was feeling better. On re-exam RLQ pain < 5/10 & pt states she feels comfortable enough to go home. Primary ED care team had suggested possible admit for serial abd exam, but pt insists she wants to go home. Given imaging findings & hemodynamic instability (in absence of fever) agree pt sade to d/c home w/close monitoring. Strict return precautions discussed w/pt. She has PMD whom she will call tomorrow.    Elba Barman, [place] M. Toy Cookey, MD  Resident  03/07/11 [phone]

## 2011-03-07 NOTE — ED Notes (Signed)
01:15 After speaking with Dr. Jerold Coombe, pt has now decided to complete treatment.   24g angiocatheter placed in the rt inner wrist. Success with 1st attempt, pt tolerated procedure well.  01:25 Pt states that her pain is currently a

## 2011-03-07 NOTE — ED Notes (Signed)
Care assumed by this nurse/writer. Called to the bedside by this patient who appeared very agitated. Demanding that her IV be removed so that she can go home " I can have pain at home, I don't need to be here for that " Dr. Jerold Coombe made aware. IV was dc'd per patient's request.

## 2011-03-07 NOTE — ED Notes (Signed)
Assisting primary RN.   Pt just spoke with Dr Maudie Flakes and is feeling better, tolerated fluids and ambulated to the bathroom, steady gait, may proceed to prepare to d/c . IV removed, catheter intact, getting dressed.  Await official d/c papers/script if written.

## 2011-03-11 ENCOUNTER — Telehealth (INDEPENDENT_AMBULATORY_CARE_PROVIDER_SITE_OTHER): Payer: Self-pay | Admitting: Internal Medicine

## 2011-03-11 NOTE — Telephone Encounter (Signed)
Pt reports pain is not as bad as it was, but still present. Has continued discomfort. 4/10 right lower abd and back. ED notes: US showed fibroids, CT negative. Concerned d/t hx of colitis that almost required a colostomy. Reports her baseline is difficulties with BMs. Her pain has been controlled and needs very little percocet. Cautioned as percocet causes constipation, she has continued colace. She reports her BM have been "small" but this is a normal finding.    Cautioned patient that per ED notes appears that the wanted to monitor her inpatient for abd series. If she has any worsening of pain, na/v/d/, blood in stool, fever/chills, diaphoresis, abd swelling pain or tenderness, she needs to return to ED or notify PMD office.    She would like to re-establish care when new female attending panel opens.   Triage please follow up Friday 08/03

## 2011-03-14 NOTE — Telephone Encounter (Signed)
Placed follow up call to pt reporting she is better P/S 2/10.Tolerating solids foods and liquids without difficulty.  Pt reports after constipated stool pain decreased somewhat.  Denies: n/v,fever,chills.abd swelling or tenderness.    STRICT ED precautions if pain increases.Pt agreed and verbalized understanding of instructions.    Pt will call to re-establish later this month d/t financial situation.    FYI Dr.Sani

## 2011-03-17 ENCOUNTER — Other Ambulatory Visit (INDEPENDENT_AMBULATORY_CARE_PROVIDER_SITE_OTHER): Payer: Self-pay | Admitting: Internal Medicine

## 2011-03-17 MED ORDER — TRAMADOL HCL 50 MG OR TABS
100.0000 mg | ORAL_TABLET | Freq: Three times a day (TID) | ORAL | Status: DC | PRN
Start: 2011-03-17 — End: 2011-04-08

## 2011-03-17 NOTE — Telephone Encounter (Signed)
Received refill request: [facility] req. Refill for Tramadol 50mg  tabs  Last Visit: 03/06/11 Dr. Ileene Rubens  New Visit: none  Last refilled: 02/20/11 disp.#180x0 refills  Recent ED visits: 03/06/11 dx: flank pain   Comment: routing to Dr. Ileene Rubens for review.  Dr. Robin Searing & Dr. Laureen Ochs no longer Residents.   Lab Results   Component Value Date    NA 141 03/06/2011    K 4.2 03/06/2011    CL 104 03/06/2011    BICARB 24 03/06/2011    BUN 11 03/06/2011    CREAT 0.51 03/06/2011    GLU 136 03/06/2011    [place] 10.3 03/06/2011

## 2011-03-17 NOTE — Telephone Encounter (Signed)
Refill not due until 8/11, but I will refill one time now. Please advise patient not to exceed prescribed dose which is 2 tablets every 8 hours. She also was prescribed Percocet 7/27. Please ask her to make appointment to re-establish in care in resident clinic to re-evaluate  pain management.

## 2011-03-18 NOTE — Telephone Encounter (Signed)
Called pt. And advised per Dr. Alonza Smoker of approved refill for Tramadol 50mg  tabs and to follow the directions on dosing.  Pt. Stated she has had less abdominal pain but feels bloated. She is walking as much as possible to keep her metabolism elevated which helping her have regular bowel movements.  Pt. Stated she will f/u if no improvement or worsening of symptoms.

## 2011-03-20 ENCOUNTER — Other Ambulatory Visit (INDEPENDENT_AMBULATORY_CARE_PROVIDER_SITE_OTHER): Payer: Self-pay | Admitting: Neurology

## 2011-03-20 MED ORDER — PROMETHAZINE HCL 12.5 MG OR TABS
12.5000 mg | ORAL_TABLET | Freq: Two times a day (BID) | ORAL | Status: DC | PRN
Start: 2011-03-20 — End: 2011-04-23

## 2011-03-20 NOTE — Telephone Encounter (Signed)
Routing to cross cover for review. Dr.Greenfield no longer in clinic.

## 2011-03-20 NOTE — Telephone Encounter (Signed)
Chart reviewed.  Received phenergan in 02/2011 for nausea.

## 2011-03-20 NOTE — Telephone Encounter (Signed)
Prescription Refill Request Received via fax    Last visit Date: 7.26.12  Last Refill request per fax: 7.19.12  Next OV:  NONE     Medications:   promethazine (PHENERGAN) 12.5 MG tablet      Name of Pharmacy in patients demographics? Yes   If not please update patient's pharmacy demographics.

## 2011-04-02 ENCOUNTER — Other Ambulatory Visit (INDEPENDENT_AMBULATORY_CARE_PROVIDER_SITE_OTHER): Payer: Self-pay | Admitting: Neurology

## 2011-04-02 NOTE — Telephone Encounter (Signed)
Verbally confirmed name of Primary Care Provider: yes    Last visit Date: 03/06/11  Next off visit: 04/23/11 re-establish w/ Dr. Anselm Jungling    Medications:  1.carvedilol (COREG) 25 MG tablet  Pt is completely out of medication.     Name of Pharmacy Updated in Demographics: yes    Name and phone number of pharmacy if not in Epic Database:

## 2011-04-02 NOTE — Telephone Encounter (Signed)
Pt. Has refills for Coreg 25mg  tabs written 12/02/10 disp.#180x3 refills via [facility] [phone].  Re-faxed current order to [facility] and called M.D. Line to verify current order.  Advised to call back if problems.  Closing.

## 2011-04-03 ENCOUNTER — Telehealth (INDEPENDENT_AMBULATORY_CARE_PROVIDER_SITE_OTHER): Payer: Self-pay | Admitting: Neurology

## 2011-04-03 NOTE — Telephone Encounter (Signed)
Pt is requesting to speak to nurse in regards to her Coreg medication. Pt states she has always taken 12.5 mg of this medication. Please call pt at [phone].

## 2011-04-03 NOTE — Telephone Encounter (Addendum)
Called pt. And reviewed w/ pt. Current order for Coreg which is for 25mg  BID.  Pt. Stated she is taking 12.5mg  tabs BID and has been feeling fine.  She is checking her B/p and if her pressure goes to low for her ie. 120/60 she cannot get out of bed because she feels hypotensive.  Pt. States she usually runs 150/ 80's on avg. And feels good to function daily.  She stated she will discuss this and write down her B/p readings before her pre-scheduled appt. On 04/23/11 w/ Dr. Anselm Jungling.  Closing.

## 2011-04-04 ENCOUNTER — Telehealth (INDEPENDENT_AMBULATORY_CARE_PROVIDER_SITE_OTHER): Payer: Self-pay | Admitting: Neurology

## 2011-04-04 NOTE — Telephone Encounter (Signed)
Verbally confirmed name of Primary Care Provider: yes    Last visit Date: 03/06/2011    Medications:  carvedilol (COREG) 12.5 MG tablet       Name of Pharmacy Updated in Demographics: yes    Name and phone number of pharmacy if not in Epic Database:

## 2011-04-04 NOTE — Telephone Encounter (Signed)
Current order for pt is 25mg  BID prescribed by Dr.Sani. Placed call to pharmacy. Pharmacist clarified that there was a double order for Carvedilol. There was an  Order from [facility] on 12/25/09 for #180 for 4 refills which is a year. Dr.Pinney discontinued on 06/26/10 and prescribed 25mg  BID, which is current order. Previous order was never cancelled with pharmacy and pharmacy continued to fill 12.5mg . Pt will be establishing care with Dr.Ho on 04/23/11 and discuss medication dosage when she establishes care. Routing to Dr.Ho as Lorain Childes

## 2011-04-08 ENCOUNTER — Other Ambulatory Visit (INDEPENDENT_AMBULATORY_CARE_PROVIDER_SITE_OTHER): Payer: Self-pay | Admitting: Neurology

## 2011-04-08 ENCOUNTER — Inpatient Hospital Stay
Admission: AD | Admit: 2011-04-08 | Discharge: 2011-04-19 | Disposition: A | Discharge status: Home Health | Payer: Self-pay | Attending: Hospitalist | Admitting: Hospitalist

## 2011-04-08 ENCOUNTER — Telehealth (INDEPENDENT_AMBULATORY_CARE_PROVIDER_SITE_OTHER): Payer: Self-pay | Admitting: Neurology

## 2011-04-08 DIAGNOSIS — E119 Type 2 diabetes mellitus without complications: Secondary | ICD-10-CM

## 2011-04-08 LAB — URINALYSIS
Nitrite: NEGATIVE
pH: 6 (ref 5.0–8.0)

## 2011-04-08 MED ORDER — HYDRALAZINE HCL 20 MG/ML IJ SOLN
10.0000 mg | Freq: Four times a day (QID) | INTRAMUSCULAR | Status: DC | PRN
Start: 2011-04-08 — End: 2011-04-19

## 2011-04-08 MED ORDER — ALUMINUM & MAGNESIUM HYDROXIDE 200-200 MG/5ML OR SUSP
30.0000 mL | Freq: Once | ORAL | Status: DC
Start: 2011-04-08 — End: 2011-04-08

## 2011-04-08 MED ORDER — HYDROMORPHONE HCL 1 MG/ML IJ SOLN
0.5000 mg | INTRAMUSCULAR | Status: DC | PRN
Start: 2011-04-08 — End: 2011-04-12
  Administered 2011-04-09: 0.5 mg via INTRAVENOUS
  Filled 2011-04-08 (×2): qty 1

## 2011-04-08 MED ORDER — ACETAMINOPHEN 325 MG PO TABS
650.0000 mg | ORAL_TABLET | ORAL | Status: DC | PRN
Start: 2011-04-08 — End: 2011-04-19
  Filled 2011-04-08 (×2): qty 2

## 2011-04-08 MED ORDER — CARVEDILOL 6.25 MG OR TABS
20.0000 mg | ORAL_TABLET | Freq: Two times a day (BID) | ORAL | Status: DC
Start: 2011-04-08 — End: 2011-04-09

## 2011-04-08 MED ORDER — ONDANSETRON HCL 4 MG/2ML IV SOLN
4.00 mg | Freq: Once | INTRAMUSCULAR | Status: AC
Start: 2011-04-08 — End: 2011-04-08
  Administered 2011-04-08: 4 mg via INTRAVENOUS
  Filled 2011-04-08: qty 2

## 2011-04-08 MED ORDER — CIPROFLOXACIN IN D5W 400 MG/200ML IV SOLN
400.0000 mg | Freq: Two times a day (BID) | INTRAVENOUS | Status: DC
Start: 2011-04-08 — End: 2011-04-11
  Filled 2011-04-08 (×7): qty 200

## 2011-04-08 MED ORDER — SODIUM CHLORIDE 0.9 % IV SOLN
INTRAVENOUS | Status: DC | PRN
Start: 2011-04-08 — End: 2011-04-19

## 2011-04-08 MED ORDER — ONDANSETRON HCL 4 MG/2ML IV SOLN
4.00 mg | Freq: Once | INTRAMUSCULAR | Status: AC
Start: 2011-04-08 — End: 2011-04-08
  Filled 2011-04-08: qty 2

## 2011-04-08 MED ORDER — AMLODIPINE 10 MG OR TABS
10.0000 mg | ORAL_TABLET | Freq: Every day | ORAL | Status: DC
Start: 2011-04-09 — End: 2011-04-19
  Administered 2011-04-18: 10 mg via ORAL
  Filled 2011-04-08 (×11): qty 1

## 2011-04-08 MED ORDER — SODIUM CHLORIDE 0.9 % IJ SOLN (CUSTOM)
3.0000 mL | INTRAMUSCULAR | Status: DC | PRN
Start: 2011-04-08 — End: 2011-04-19
  Administered 2011-04-17: 3 mL via INTRAVENOUS

## 2011-04-08 MED ORDER — ALUM & MAG HYDROXIDE-SIMETH 200-200-20 MG/5ML OR SUSP
30.00 mL | Freq: Once | ORAL | Status: AC
Start: 2011-04-08 — End: 2011-04-08
  Filled 2011-04-08: qty 30

## 2011-04-08 MED ORDER — CLONIDINE HCL 0.1 MG OR TABS
0.3000 mg | ORAL_TABLET | Freq: Three times a day (TID) | ORAL | Status: DC
Start: 2011-04-09 — End: 2011-04-19
  Administered 2011-04-09 – 2011-04-11 (×3): 0.3 mg via ORAL
  Filled 2011-04-08: qty 1
  Filled 2011-04-08 (×3): qty 3
  Filled 2011-04-08: qty 1
  Filled 2011-04-08 (×3): qty 3
  Filled 2011-04-08: qty 1
  Filled 2011-04-08 (×2): qty 3
  Filled 2011-04-08: qty 39
  Filled 2011-04-08: qty 3
  Filled 2011-04-08: qty 1
  Filled 2011-04-08 (×3): qty 3
  Filled 2011-04-08: qty 1
  Filled 2011-04-08 (×7): qty 3
  Filled 2011-04-08: qty 1
  Filled 2011-04-08: qty 3
  Filled 2011-04-08 (×3): qty 1
  Filled 2011-04-08 (×2): qty 3

## 2011-04-08 MED ORDER — HYDROMORPHONE HCL 1 MG/ML IJ SOLN
1.00 mg | Freq: Once | INTRAMUSCULAR | Status: AC
Start: 2011-04-08 — End: 2011-04-08
  Filled 2011-04-08: qty 1

## 2011-04-08 MED ORDER — TRAMADOL HCL 50 MG OR TABS
100.0000 mg | ORAL_TABLET | Freq: Three times a day (TID) | ORAL | Status: DC | PRN
Start: 2011-04-08 — End: 2011-05-06

## 2011-04-08 MED ORDER — LIDOCAINE VISCOUS 2 % MT SOLN
10.00 mL | Freq: Once | OROMUCOSAL | Status: AC
Start: 2011-04-08 — End: 2011-04-08
  Filled 2011-04-08: qty 15

## 2011-04-08 MED ORDER — MORPHINE SULFATE 4 MG/ML IJ SOLN
4.00 mg | Freq: Once | INTRAMUSCULAR | Status: AC
Start: 2011-04-08 — End: 2011-04-08
  Filled 2011-04-08: qty 1

## 2011-04-08 MED ORDER — GLIPIZIDE 5 MG OR TB24
5.0000 mg | ORAL_TABLET | Freq: Every day | ORAL | Status: DC
Start: 2011-04-08 — End: 2011-05-06

## 2011-04-08 MED ORDER — ZOLPIDEM TARTRATE 5 MG OR TABS
5.0000 mg | ORAL_TABLET | Freq: Every evening | ORAL | Status: DC | PRN
Start: 2011-04-08 — End: 2011-04-19
  Administered 2011-04-12 – 2011-04-13 (×2): 5 mg via ORAL
  Filled 2011-04-08 (×11): qty 1

## 2011-04-08 MED ORDER — SODIUM CHLORIDE 0.9 % IJ SOLN (CUSTOM)
3.0000 mL | Freq: Three times a day (TID) | INTRAMUSCULAR | Status: DC
Start: 2011-04-08 — End: 2011-04-19
  Administered 2011-04-10 – 2011-04-18 (×4): 3 mL via INTRAVENOUS

## 2011-04-08 MED ORDER — HYDROMORPHONE HCL 1 MG/ML IJ SOLN
1.0000 mg | INTRAMUSCULAR | Status: DC | PRN
Start: 2011-04-08 — End: 2011-04-12
  Administered 2011-04-08 – 2011-04-12 (×20): 1 mg via INTRAVENOUS
  Filled 2011-04-08 (×20): qty 1

## 2011-04-08 MED ORDER — SODIUM CHLORIDE 0.9 % IV SOLN
Freq: Once | INTRAVENOUS | Status: AC
Start: 2011-04-08 — End: 2011-04-08

## 2011-04-08 MED ORDER — LISINOPRIL 40 MG OR TABS
40.0000 mg | ORAL_TABLET | Freq: Every day | ORAL | Status: DC
Start: 2011-04-10 — End: 2011-04-09

## 2011-04-08 MED ORDER — SODIUM CHLORIDE 0.9 % IV SOLN
INTRAVENOUS | Status: DC
Start: 2011-04-08 — End: 2011-04-11

## 2011-04-08 MED ORDER — LISINOPRIL 40 MG OR TABS
40.0000 mg | ORAL_TABLET | Freq: Two times a day (BID) | ORAL | Status: DC
Start: 2011-04-08 — End: 2011-04-08
  Filled 2011-04-08: qty 1

## 2011-04-08 MED ORDER — MORPHINE SULFATE 4 MG/ML IJ SOLN
8.00 mg | Freq: Once | INTRAMUSCULAR | Status: AC
Start: 2011-04-08 — End: 2011-04-08
  Filled 2011-04-08: qty 2

## 2011-04-08 MED ORDER — SODIUM CHLORIDE 0.9 % IV SOLN
8.0000 mg | Freq: Three times a day (TID) | INTRAVENOUS | Status: DC | PRN
Start: 2011-04-08 — End: 2011-04-19
  Filled 2011-04-08 (×6): qty 4

## 2011-04-08 MED ORDER — SODIUM CHLORIDE 0.9 % IV SOLN
INTRAVENOUS | Status: DC
Start: 2011-04-08 — End: 2011-04-08

## 2011-04-08 MED ORDER — METRONIDAZOLE IN NACL 5-0.79 MG/ML-% IV SOLN
500.0000 mg | Freq: Three times a day (TID) | INTRAVENOUS | Status: DC
Start: 2011-04-08 — End: 2011-04-11
  Administered 2011-04-10: 500 mg via INTRAVENOUS
  Filled 2011-04-08 (×8): qty 100

## 2011-04-08 NOTE — Telephone Encounter (Signed)
Verbally confirmed name of Primary Care Provider: yes    What is reason for call:  Abdominal pain and rectal bleeding, pain a 10    Confirmed Contact Number:yes    This message will be transmitted to our triage nurse, you can expect a call by the end of the working day.

## 2011-04-08 NOTE — ED Notes (Signed)
Assisting primary nurse. Pt ambulated to restroom, pt has severe 10/10 abd spasms. Urine collected, soft id, upt neg, sent. Dr. Rubye Beach aware of pts pain.

## 2011-04-08 NOTE — Telephone Encounter (Signed)
Prescription Refill Request Received via fax    Last visit Date: 7.26.12  Last Refill request per fax:    Next OV: 9.12.12  HO     Medications:   glipiZIDE (GLIPIZIDE XL) 5 MG CR tablet     Last fill. 7.23.12  traMADol (ULTRAM) 50 MG tablet       Last fill. 8.6.12        Name of Pharmacy in patients demographics? Yes   If not please update patient's pharmacy demographics.

## 2011-04-08 NOTE — Telephone Encounter (Signed)
Reviewed chart. Pt being seen [facility] ED now.Triage Nurse will f/u on tomorrow.

## 2011-04-08 NOTE — ED Notes (Signed)
Pt to ct via gurney

## 2011-04-08 NOTE — ED Notes (Signed)
Pt reports immediate decrease in pain and relief of nausea s/p meds, comfortable semi-fowlers in bed  Awaiting CT

## 2011-04-08 NOTE — ED Notes (Signed)
Correction pt not awaiting CT, pt awaiting hosp admission

## 2011-04-08 NOTE — ED Notes (Signed)
Blood samples collected/labeled and sent to the lab.

## 2011-04-08 NOTE — ED Notes (Signed)
Assisting primary nurse. Pt back from CT, has pain 9/10 and n/v, will inform md

## 2011-04-08 NOTE — ED Notes (Signed)
No change in cond, report to [facility], rm ready pt transferred to flr w/ RN

## 2011-04-08 NOTE — H&P (Signed)
HISTORY AND PHYSICAL    Attending MD:  Emiliano Dyer, Janell Quiet Staff MD: Malachy Mood MD    Chief Complaint: RLQ pain        History of Present Illness:     Katherine Church is a 45 year old woman with PMHx of DM, HTN and ischemic colitis (2011) who is here for evaluation of the above chief complaint.  On Friday evening around 2am Katherine Church states that she began having severe abdominal pain located in her RLQ, which she characterized as a pressure that was relived once she had a watery BM that same evening.  Since Friday evening she has been having a constant feeling of pressure in her RLQ exacerbated by movement and eating and  briefly relieved by BM. Stated that she did not take anything to relieve the pain.  She endorses having elevated pulse, palpitations, hot flashes, shaking,  Nausea,    1 episode of diarrhea per day, with the most recent BM today being described as thick and dark, and 1 episode of vomiting clear fluid in the ED.  She denied fevers, SOB, difficulty swallowing.   She has a Hx of ulcerative colitis in 2011 diagnosed by colonoscopy, managed medically.     Review of Systems:  +hot flashes, nausea, vomiting, watery bowel movements, ?melena? Chills, palpations, passing gas  - fevers, SOB, difficulty swallowing, chest pain.     Past Medical and Surgical History:  Past Medical History   Diagnosis Date   . Back pain    . High blood pressure    . Diabetes mellitus      Past Surgical History   Procedure Date   . Anesth,tubal ligation/transection 1994       Allergies:  Allergies   Allergen Reactions   . Tetanus Toxoids Swelling       Medications:    Social History:  Lives with boyfriend, denied smoking drinking, does use marijuana to relieve stomach pain.   Two kids-adult in [place] (going thru med school [place]); son still deciding.Born and raised in [place].Moved to [facility] in august 2010-boyfriend works for [employer].       Family History:  Family History   Problem Relation Age of Onset   .  Diabetes Father    . Lipids Father    . Hypertension Father    . Hypertension Mother          Physical Exam:  BP 153/90  Pulse 89  Temp 98.9 F (37.2 C)  Resp 18  Ht 5\' 4"  (1.626 m)  Wt 77.111 kg (170 lb)  BMI 29.18 kg/m2  SpO2 99%  LMP 01/10/2011  Gen: middle aged woman uncomfortable in pain  Neuro: A+O x3, no focal findings  HEENT: OP clear, mucosa moise, PERRL, EOMI, no conjunctival palor  Lung: equal air entry bilaterally, no Crackles, rhonci, rhales, wheezes  CV: RRR, normal S1/S2   no m/r/g   Back- non tender no CVA tenderness  Abd: No bowel sounds appreciated, obese abdomen, tender RLQ , no HSM  Ext:  No edema, warm ext. no Clubbing or cyanosis  Skin: redness around neck, no rashes, tattoo upper back      Labs and Other Data:  Lab Results   Component Value Date    BUN 8 04/08/2011    CREAT 0.47 04/08/2011    CL 101 04/08/2011    NA 137 04/08/2011    K 3.9 04/08/2011    [place] 10.6 04/08/2011  TBILI 0.5 04/08/2011    ALB 4.5 04/08/2011    TP 8.2 04/08/2011    AST 26 04/08/2011    ALK 81 04/08/2011    BICARB 19 04/08/2011    ALT 21 04/08/2011    GLU 101 04/08/2011   Lactate 20 --> 9.7    Lab Results   Component Value Date    WBC 17.6 04/08/2011    RBC 5.23 04/08/2011    HGB 15.2 04/08/2011    HCT 42.9 04/08/2011    MCV 82.0 04/08/2011    MCHC 35.4 04/08/2011    RDW 13.4 04/08/2011    PLT 297 04/08/2011    PLT 219 05/21/2009    MPV 9.9 04/08/2011     Imaging   04/08/11  IMPRESSION: CT scan with IV contrast  1. Unremarkable CT scan of the abdomen and pelvis. No findings to suggest a   colitis. No evidence of perforation or free air.   2. Normal appearance of the vasculature.      Assessment and Care Plan:  Katherine Church is a 45 year old woman with a Hx of  DM, HTN and ischemic colitis, now presenting with 5 days of constant RLQ pain and pressure associated nausea,  thick dark BM's, absent bowel sounds on physical examination and an elevated WBC concerning for ischemic colitis vs. Mesenteric ischemia vs.  Bowel obstriction IBD of  cecal area.    #Abdominal pain- concerning for Ischemic bowel  Event, obstruction or inflamation   - I.V fluids  -start cipro, flagyl  -N.P.O  -Stool studies- guaiac, c. Diff toxin, culture, Ovum parasite.  -Zofran for alleviation of nausea and vomiting  -Consult GI about colonoscopy r/o ischemic colitis    #HTN-Continue Home meds  -Clonidine 0.3mg  T.I.D  Amlodipine 10mg   -carvedilil 25mg  B.I.D  -    #Diabetis-  SSI q6 checks  -d/c glipizide     PPx- hold ppx pt until G.I bleed ruled out.     Code Status:  Full Code      Note Author: Jettie Booze M.D, 04/08/2011, 6:05 PM  PGY-1 Internal Medicine  [phone]    ATTENDING HISTORY AND PHYSICAL ATTESTATION    Subjective    Chief complaint:  Nasuea, vomiting, abdominal pain x 4 days    History of present illness:  45 yo F with history of prior ischemic colitis who presents with 4 days of increased abdominal pain RLQ with associated anorexia, nausea and vomiting as well as some initial constipation followed by diarrhea and some bloody stools today.  Patient otherwise denies any fevers, chills, chest pain, shortness of breath changes in urination.     See resident H&P for past medical, surgical, social, family history, allergies and medications  ROS reviewed and otherwise negative on 10 point scale except as documented per resident H&P    Objective  Blood pressure 169/113, pulse 96, temperature 98.9 F (37.2 C), resp. rate 20, height 5\' 4"  (1.626 m), weight 78.5 kg (173 lb 1 oz), last menstrual period 01/10/2011, SpO2 100.00%.    I have examined the patient and concur with the resident/fellow/NP/PA exam.    Assessment and Plan    I agree with the resident/fellow/NP/PA care plan.    See the resident / fellow history and physical for further details.    45 yo F with history of ischemic colitis who presents with increased abdominal pain concerning for possible recurrent ischemic, infectious, inflammatory colitis.     1.  NPO, IV antibiotics, IV fluids  2.  Stool studies  3.   Anti-nausea medications  4. Continue HTN meds with hold parameters  5.  Hold glipizide, ISS for elevated sugars.     Gwynneth Munson, MD  Hospital Medicine Attending  PID [phone]

## 2011-04-08 NOTE — ED Provider Notes (Signed)
CC. Rectal Problem      HPI. Patient is a 45 year old female presents with severe abd pain nausea vomiting and BRBPR for several days. H/o ischemic colitis in 2011 on endoscopy. Having very small BMs. Frequent belching today as well.     Past Medical History   Diagnosis Date   . Back pain    . High blood pressure    . Diabetes mellitus      Past Surgical History   Procedure Date   . Anesth,tubal ligation/transection 1994     Current Facility-Administered Medications   Medication   . morphine injection 4 mg   . ondansetron (ZOFRAN) injection 4 mg     Current Outpatient Prescriptions   Medication Sig   . promethazine (PHENERGAN) 12.5 MG tablet Take 1 tablet by mouth every 12 hours as needed for Nausea.   . traMADol (ULTRAM) 50 MG tablet Take 2 tablets by mouth every 8 hours as needed for Moderate Pain (Pain Score 4-6).   Marland Kitchen oxyCODONE-acetaminophen (PERCOCET) 5-325 MG per tablet Take 1 tablet by mouth every 6 hours as needed for Severe Pain (Pain Score 7-10).   . ondansetron (ZOFRAN) 4 MG tablet Take 1 tablet by mouth every 8 hours as needed for Nausea/Vomiting.   . clonidine (CATAPRES) 0.3 MG tablet Take 1 tablet by mouth 3 times daily.   Marland Kitchen lisinopril (PRINIVIL, ZESTRIL) 40 MG tablet Take 1 tablet by mouth 2 times daily.   Marland Kitchen amLODIPINE (NORVASC) 10 MG tablet Take 1 tablet by mouth daily.   . carvedilol (COREG) 25 MG tablet Take 20 mg by mouth 2 times daily.   Marland Kitchen glipiZIDE (GLIPIZIDE XL) 5 MG CR tablet Take 1 tablet by mouth daily.   Marland Kitchen docusate sodium (COLACE) 100 MG capsule Take 2 capsules by mouth 2 times daily.     Allergies   Allergen Reactions   . Tetanus Toxoids Swelling       EXAM   BP 160/115  Pulse 94  Temp 98.9 F (37.2 C)  Resp 20  Ht 5\' 4"  (1.626 m)  Wt 77.111 kg (170 lb)  BMI 29.18 kg/m2  SpO2 99%  LMP 01/10/2011  GEN uncomfortable and vomiting   HEENT EOMI PERRL anicteric OP clear  NECK No LAN, No thyromegaly  CHEST CTAB, no chest wall tenderness  CVS RRR, no MRG, well perfused in all  extremities  ABD soft diffusely tender but maximally in RLQ>LLQ  GU no hernia  SKIN no rash  BACK NT  EXT no edema or tenderness with strong symmetric peripheral pulses    IMPRESSION  Abdominal pain  Vomiting   Severe pain and h/o ischemic colitis is concerning  Labs demonstrate leukocytosis and acidosis although lactate only minimally elevated  UA contaminated    PLAN  IV fluids  Analgesic   Antiemetic   CT angio abd  Reassess     ED COURSE  Care endorsed to Dr. Odis Hollingshead to f/u CT. Anticipate admission.       Roselee Nova, MD  04/08/11 [phone]

## 2011-04-08 NOTE — Telephone Encounter (Signed)
Recent ED visits: 04/08/11 dx: lower back pain  Comment: routing to Dr. Ileene Rubens for review whom saw pt. On 03/06/11.   Dr. Robin Searing no longer Resident.  Pt. To est. Care w/ Dr. Anselm Jungling.   Lab Results   Component Value Date    NA 141 03/06/2011    K 4.2 03/06/2011    CL 104 03/06/2011    BICARB 24 03/06/2011    BUN 11 03/06/2011    CREAT 0.51 03/06/2011    GLU 136 03/06/2011    [place] 10.3 03/06/2011     Lab Results   Component Value Date    A1C 7.4 01/21/2011    A1C 7.9 11/28/2010    A1C 6.8 05/02/2010

## 2011-04-08 NOTE — ED Notes (Signed)
RN inserted 20g to rac for CT scan with contrast at high pressure.

## 2011-04-08 NOTE — ED Notes (Signed)
Walked into room and pt was bent over complaining of severe stomach cramps. Provided basin. Pt began to vomit clear and fluid with red tinge that pt states was a red mint that she had been eating to settle her stomach. Pt describes her stomach pain as a severe burning and has nausea. Pain is located in right lower quad. Pt states started bleeding bright from her rectum on today and along with very loose very dark brown stool

## 2011-04-08 NOTE — ED Notes (Signed)
Lactate + lav, blue samples collected/labeled and sent to the lab.

## 2011-04-08 NOTE — ED Notes (Addendum)
Emergency Medicine Attending Change of Shift Note    Assumed care for patient at change of shift. Attending level sign-out from Dr.LY who was the primary caregiver for this patient during the ED stay. The plan of care and disposition is as follows:    Latest vitals:  Filed Vitals:    04/08/11 1334   BP: 164/103   Pulse: 80   Temp:    Resp: 18   SpO2: 99%       Pt is a 45 year old yo female here with h/o ischemic colitis, here with abd pain and n+v. CTA abd pending. Labs with inc wbc. Also w brbpr --> will need ADMIT. Ct for admit    Further management will be expectant.  Please see additional MD notes for more details.       Rowland Lathe, MD  04/08/11 1505    Ct angio: [id number]  [04/08/2011 5:10 PM  dcaovan]Prelim: No acute process. No evidence of perforation or free air. No findings to suggest ischemia.  - d/w GI and medicine  - continue ivf and pain meds. No prep for now, follow lactate  Admit med/surg, spoke with mike from GI (fellow) and medicine resident on call    Rowland Lathe, MD  04/08/11 [phone]

## 2011-04-08 NOTE — ED Notes (Addendum)
Placed pt on bedpan. Pt urinated 400 mL light yellow

## 2011-04-08 NOTE — Telephone Encounter (Signed)
Thank you, agree and approve ED referral, thanks, AS

## 2011-04-08 NOTE — ED Notes (Signed)
Admitting team at bedside for exam.

## 2011-04-08 NOTE — Telephone Encounter (Addendum)
Incoming call transferred directly to triage nurse. Received phone call as a red flag  AV:WUJWJ Ischemic Colitis, ABD pain and Chronic constipation  Spoke with pt C/O severe abd pain P/S 10/10 with  Bloody diarrhea and heart palpitation  Onset: 04/04/11 increase in abd pain with abd pressure  Duration:Intermittent  Characteristic:pressure like pain  Aggravating Factors:Food  Relieving Factors:Nothing    Denies:chest pain/pressure,nausea and vomiting pt reports taking phenergan 12.5 mg   Recent 9  Month of constipation and today one time bloody diarrhea stool this am dark chocolate pasty stool    Nurse STRONGLY encouraged pt to proceed to ED now d/t bloody diarrhea and Abd pain 10/10  Pt agreed and verbalized understanding of instructions.    LOV 03/06/11  Dr. Ileene Rubens ABD pain RLQ  Dr. Ileene Rubens Please advise okay for ED consult .    Nurse will follow up in AM

## 2011-04-09 LAB — CBC WITH DIFF, BLOOD
Abs Eosinophils: 0.2 10*3/uL (ref 0.0–0.5)
Abs Lymphs: 3.2 10*3/uL — ABNORMAL HIGH (ref 0.8–3.1)
Abs Monos: 0.6 10*3/uL (ref 0.2–0.8)
Absolute Neutrophil Count: 8.6 10*3/uL — ABNORMAL HIGH (ref 1.6–7.0)
Eosinophils: 2 % (ref 1–7)
Hct: 36.1 % (ref 34.0–45.0)
Hgb: 12.7 gm/dL (ref 11.2–15.7)
Lymphocytes: 25 % (ref 19–53)
MCH: 28.7 pg (ref 26.0–32.0)
MCHC: 35.2 % (ref 32.0–36.0)
MCV: 81.5 um3 (ref 79.0–95.0)
MPV: 9.3 fL — ABNORMAL LOW (ref 9.4–12.4)
Monocytes: 5 % (ref 5–12)
Plt Count: 214 10*3/uL (ref 140–370)
RBC: 4.43 10*6/uL (ref 3.90–5.20)
RDW: 13.3 % (ref 12.0–14.0)
Segs: 68 % (ref 34–71)
WBC: 12.7 10*3/uL — ABNORMAL HIGH (ref 4.0–10.0)

## 2011-04-09 LAB — SED RATE, BLOOD: Sed Rate: 20 mm/hr (ref 0–20)

## 2011-04-09 MED ORDER — SODIUM CHLORIDE 0.9 % IV SOLN
6.25 mg | Freq: Once | INTRAVENOUS | Status: AC
Start: 2011-04-09 — End: 2011-04-09
  Filled 2011-04-09: qty 0.25

## 2011-04-09 MED ORDER — POTASSIUM CHLORIDE 10 MEQ/100ML IV SOLN
10.00 meq | INTRAVENOUS | Status: AC
Start: 2011-04-09 — End: 2011-04-10
  Administered 2011-04-09 – 2011-04-10 (×2): 10 meq via INTRAVENOUS
  Filled 2011-04-09 (×3): qty 100

## 2011-04-09 MED ORDER — SODIUM CHLORIDE 0.9 % IV SOLN
6.25 mg | Freq: Once | INTRAVENOUS | Status: AC
Start: 2011-04-09 — End: 2011-04-09
  Administered 2011-04-09: 6.25 mg via INTRAVENOUS
  Filled 2011-04-09: qty 0.25

## 2011-04-09 MED ORDER — LISINOPRIL 40 MG OR TABS
40.0000 mg | ORAL_TABLET | Freq: Two times a day (BID) | ORAL | Status: DC
Start: 2011-04-09 — End: 2011-04-19
  Administered 2011-04-13 – 2011-04-17 (×2): 40 mg via ORAL
  Filled 2011-04-09 (×19): qty 1

## 2011-04-09 MED ORDER — PANTOPRAZOLE SODIUM 40 MG IV SOLR
40.0000 mg | Freq: Two times a day (BID) | INTRAVENOUS | Status: DC
Start: 2011-04-09 — End: 2011-04-12
  Filled 2011-04-09 (×6): qty 40

## 2011-04-09 MED ORDER — CARVEDILOL 25 MG OR TABS
25.0000 mg | ORAL_TABLET | Freq: Two times a day (BID) | ORAL | Status: DC
Start: 2011-04-09 — End: 2011-04-19
  Administered 2011-04-10 – 2011-04-19 (×4): 25 mg via ORAL
  Filled 2011-04-09 (×22): qty 1

## 2011-04-09 MED ORDER — SODIUM CHLORIDE 0.9 % IV SOLN
6.2500 mg | Freq: Four times a day (QID) | INTRAVENOUS | Status: DC | PRN
Start: 2011-04-09 — End: 2011-04-14
  Filled 2011-04-09 (×10): qty 0.25

## 2011-04-09 MED ORDER — CALCIUM CARBONATE ANTACID 500 MG OR CHEW
500.0000 mg | CHEWABLE_TABLET | ORAL | Status: DC | PRN
Start: 2011-04-09 — End: 2011-04-19
  Administered 2011-04-13: 500 mg via ORAL
  Filled 2011-04-09 (×3): qty 1

## 2011-04-09 MED ORDER — LANSOPRAZOLE 30 MG OR CPDR
30.0000 mg | DELAYED_RELEASE_CAPSULE | Freq: Every day | ORAL | Status: DC
Start: 2011-04-10 — End: 2011-04-09

## 2011-04-09 MED ORDER — LORAZEPAM 2 MG OR TABS
2.0000 mg | ORAL_TABLET | Freq: Four times a day (QID) | ORAL | Status: DC | PRN
Start: 2011-04-09 — End: 2011-04-19
  Administered 2011-04-10 – 2011-04-18 (×3): 2 mg via ORAL
  Filled 2011-04-09 (×5): qty 1
  Filled 2011-04-09: qty 2
  Filled 2011-04-09: qty 1
  Filled 2011-04-09 (×2): qty 2
  Filled 2011-04-09: qty 1
  Filled 2011-04-09: qty 2

## 2011-04-09 NOTE — Interdisciplinary (Signed)
04/09/11 1559 04/09/11 1610   Patient Information   Why is Patient in the Hospital? admitted RUQ pain,n and v . PMHx of DM, HTN and ischemic colitis (2011) --    Prior to Level of Function Ambulatory/Independent with ADL's --    Assistive Device Not applicable --    Referral To   Patent examiner counseling  (funding assistance) --    Discharge Planning   Living Arrangements Spouse / significant other --    Support Systems Spouse / significant other (Starnes,Chris (LIFE PARTNER) [phone] -- -- )   Type of Residence --  Private residence   Patient expects to be discharged to: --  home with partner   Do you have transportation home?  --  Yes

## 2011-04-09 NOTE — Consults (Signed)
INITIAL CONSULT NOTE    Request for Consultation:   Asked by Yehuda Mao, MD to evaluate this patient for abdominal pain.    History of Present Illness:     Katherine Church is a 44 year old female with hx of DM, HTN and episode of ischemic colitis in 2011, presenting with RLQ abdominal pain, nausea and ? bloody stool    Patient report admitted Jan 2011 for diffuse crampy abdominal pain particularly in LUQ and LLQ, followed by hematochezia. CT showed L sided distal transverse and descending colon wall thickening. Colo to transverse only (aborted because of perf risk) performed on 09/03/09 showed patchy areas of ulcerated colonic mucosa with inflammation and edema in the sigmoid colon and splenic flexure, which became confluent areas of severely ulcerated, inflammed, edematous colonic mucosa with  multiple dusky areas in the descending colon, concerning for patchy necrosis, with necrosis confirmed on path. Patient was treated conservatively and recovered well. No etiology found, but did have very high BP during this time, up to the 200s.     At baseline, pt with constipation, occasional bloating. At the end of June, began having new onset RLQ abdominal pain with this. Seen in the ED on 7/28 with negative CT scan. Sx improved, but continued to have some discomfort in the RLQ over this past month, in addition to her usual bloating, constipation.     On Friday PTA, patient had severe RLQ as well as some generalized crampy abdominal pain. Pain was relieved when she had a largy, watery, nonbloody BM. She also had associated nausea, which did not improve until she took her home phenergan (she takes this intermittently). She was able to eat normal meals over the weekend but did have ongoing nausea. On Monday, she had recurrent severe pain, again RLQ predominant, with watery diarrhea which looked first very dark and subsequently had "blood settle out." ROS also significant + hot flashes but no chills, no  hematemesis, no odynophagia, but +hearburn and sour taste in her mouth. No recent travel, does use NSAIDs in large doses -- 400mg  qAM and 600mg  qPM daily.     Past Medical and Surgical History:  Past Medical History   Diagnosis Date   . Back pain    . High blood pressure    . Diabetes mellitus      Past Surgical History   Procedure Date   . Anesth,tubal ligation/transection 1994       Allergies:  Allergies   Allergen Reactions   . Tetanus Toxoids Swelling       Prior to Admission Medications:  Prescriptions prior to admission   Medication Sig Dispense Refill   . glipiZIDE (GLIPIZIDE XL) 5 MG CR tablet Take 1 tablet by mouth daily.  90 tablet  1   . traMADol (ULTRAM) 50 MG tablet Take 2 tablets by mouth every 8 hours as needed for Moderate Pain (Pain Score 4-6).  180 tablet  0   . promethazine (PHENERGAN) 12.5 MG tablet Take 1 tablet by mouth every 12 hours as needed for Nausea.  45 tablet  0   . oxyCODONE-acetaminophen (PERCOCET) 5-325 MG per tablet Take 1 tablet by mouth every 6 hours as needed for Severe Pain (Pain Score 7-10).  12 tablet  0   . ondansetron (ZOFRAN) 4 MG tablet Take 1 tablet by mouth every 8 hours as needed for Nausea/Vomiting.  15 tablet  0   . clonidine (CATAPRES) 0.3 MG tablet Take 1 tablet by mouth 3  times daily.  180 tablet  3   . lisinopril (PRINIVIL, ZESTRIL) 40 MG tablet Take 1 tablet by mouth 2 times daily.  60 tablet  1   . amLODIPINE (NORVASC) 10 MG tablet Take 1 tablet by mouth daily.  90 tablet  3   . carvedilol (COREG) 25 MG tablet Take 20 mg by mouth 2 times daily.  180 tablet  4   . docusate sodium (COLACE) 100 MG capsule Take 2 capsules by mouth 2 times daily.  360 capsule  4     Social History:  History     Social History   . Marital Status: Single     Spouse Name: N/A     Number of Children: 2   . Years of Education: N/A     Occupational History   . unemployed      Social History Main Topics   . Smoking status: Never Smoker    . Smokeless tobacco: Never Used   . Alcohol Use: No    . Drug Use: No   . Sexually Active: Yes -- Female partner(s)      tubal ligation     Other Topics Concern   . Blood Transfusions No   . Special Diet No   . Exercise Yes     tries to walk     Social History Narrative    Two kids-adult in [place] (going thru med school [place]); son still deciding.Born and raised in [place].Moved to [facility] in august 2010-boyfriend works for [employer].       Family History:  Family History   Problem Relation Age of Onset   . Diabetes Father    . Lipids Father    . Hypertension Father    . Hypertension Mother        Review of Systems:  Negative except as documented in the HPI    Physical Exam:  Temperature:  [98.5 F (36.9 C)-98.9 F (37.2 C)] 98.6 F (37 C) (08/29 0816)  Blood pressure (BP): (144-169)/(90-115) 144/93 mmHg (08/29 0920)  Heart Rate:  [77-100] 86  (08/29 0920)  Respirations:  [15-20] 18  (08/29 0816)  Pain Score:  [-] 9 (08/29 0914)  O2 Device:  [-] None (Room air) (08/29 0406)  SpO2:  [97 %-100 %] 97 % (08/29 0816)  General:  Ill appearing  HEENT:  Op clear, anicteric  Neck:  Full rom  Lungs:  ctab  CV:  Tachy, regular  Abdomen:  Soft, diffuse ttp but >>RLQ; no rebound or guarding  Extremities:  Nl joints without effusions  Neuro: nonfocal    Labs and Other Data:  Lab Results   Component Value Date    NA 139 04/09/2011    K 3.2* 04/09/2011    CL 99 04/09/2011    BICARB 24 04/09/2011    BUN 5* 04/09/2011    CREAT 0.46* 04/09/2011    GLU 134* 04/09/2011    Moody 8.9 04/09/2011     Lab Results   Component Value Date    WBC 11.6* 04/09/2011    HGB 13.6 04/09/2011    HCT 39.4 04/09/2011    PLT 260 04/09/2011     Lab Results   Component Value Date    AST 18 04/09/2011    ALT 19 04/09/2011    ALK 73 04/09/2011    TBILI 0.6 04/09/2011    DBILI 0.1 04/08/2011    TP 8.1* 04/09/2011    ALB 4.5 04/09/2011  Impression:  Katherine Church is a 45 year old female with hx of DM, HTN and episode of ischemic colitis in 2011, presenting with RLQ abdominal pain, nausea and what may be melena vs  hematochezia. Patient did have ischemic colitis in 2011 and is having similar sx now, but no CT evidence of disease and pain is in the RLQ at this time. However, CT can miss mild disease and does not rule out ischemic colitis. Also concerning is the history of severe hearburn, n/v with dark stool in this patient with NSAID use. Possible that the diarrhea, n, v are from UGI bleed as source. Also on the ddx is IBD, infection. Would prefer to do EGD/Colo, but doubt that with current sx patient would be able to prep.     Recommendations:  -- please check stool cx, O&P, cdiff and fecal leuks  -- send ESR/CRP  -- will plan to EGD/flex sig today vs tomorrow.  -- please keep NPO  -- protonix 40 IV BID    Patient d/w Dr. Constance Holster    ATTENDING INITIAL CONSULT NOTE ATTESTATION    Subjective    Reason for consultation:  Nausea/Vomiting/Diarrhea evaluation    History of present illness:  Mikaylee Merino is a 45 year old female with hx of DM, HTN and episode of ischemic colitis in 2011, presenting with RLQ abdominal pain, nausea and what may be melena vs hematochezia. Patient did have ischemic colitis in 2011 and is having similar sx now, but no CT evidence of disease and pain is in the RLQ at this time. However, CT can miss mild disease and does not rule out ischemic colitis. Also concerning is the history of severe hearburn, n/v with dark stool in this patient with NSAID use. Possible that the diarrhea, n, v are from UGI bleed as source. Also on the ddx is IBD, infection. Would prefer to do EGD/Colo, but doubt that with current sx patient would be able to prep.   Recommendations:   -- please check stool cx, O&P, cdiff and fecal leuks   -- send ESR/CRP   -- will plan to EGD/flex sig today vs tomorrow.   -- please keep NPO   -- protonix 40 IV BID      See resident / fellow history and physical for further details of the patient's history.    Objective    I have examined the patient and concur with the resident/fellow/NP/PA  exam.    Assessment and Plan    I agree with the resident/fellow/NP/PA care plan.    See the resident / fellow history and physical for further details.         Marland Kitchen

## 2011-04-09 NOTE — Interdisciplinary (Signed)
Patient is given pain medication before 2 ordered Tap water enemas for GI prep by Dr. Vonda Antigua. Patient tolerated 1 and half buckets of Tap water enema. Cleared water returned from Tap water administration. Report is given to Victorino Dike, nurse at GI receiving patient.

## 2011-04-09 NOTE — Plan of Care (Signed)
Problem: Tissue Perfusion - Cardiopulmonary, Altered  Goal: Hemodynamic stability  Outcome: Met  Monitored pt's vitals, labs and tele. No s/s of decreased tissue perfusion noted. Will continue to monitor.     Problem: Falls - Risk of  Goal: Absence of falls  Outcome: Met  Maintained fall precautions. Performed hourly rounds. Instructed pt to call for help as needed. No falls this shift. Will continue to monitor.    Problem: Discharge Planning  Goal: Participation in care planning  Outcome: Not Met  No d/c orders yet. Encouraged pt to verbalize any questions/ concerns regarding d/c process at any time.    Problem: Pain - Acute  Goal: Communication of presence of pain  Outcome: Met  Pt c/o pain several times since admission (see flowsheet). Medicated pt with dilaudid as ordered (see MAR). She verbalized brief relief of pain each time upon reassessment. Will continue to monitor.

## 2011-04-09 NOTE — Interdisciplinary (Signed)
Pt is c/o  nausea, asking for Phergen  also can pt have po pills if so can you change NPO status to refect that please-first call MD is text paged with above at [phone] to inform.

## 2011-04-09 NOTE — Interdisciplinary (Addendum)
Patient is asking for GI cocktail she had in ED yesterday for " stomach acid" and something for hiccups, first call MD is text paged at #[phone] to inform, will continue to monitor patient.  Dr. Christella Hartigan called at this time, MD is made aware also of Potassium at 3.2 today, MD verbalizes she "will enter orders". Will continue to monitor patient.

## 2011-04-09 NOTE — Interdisciplinary (Signed)
[facility] is sending a message to: Jettie Booze / [phone]   RE:1021B B[place],Nykayla c/o heartburn pt believed r/t acid reflux. Please order prn maalox for heartburn. Thank you, Broadus John [phone]

## 2011-04-09 NOTE — Progress Notes (Addendum)
Medicine Intern Progress Note: 04/09/2011     Current Hospital Stay:   1 day - Admitted on: 04/08/2011    Subjective: Katherine Church looked very uncomfortable when I saw her, had episode of vomiting last night, still has pain cramping in LLQ. Received femagram last night relieved feelings of nasua and vomiting.       Medications:      . promethazine (PHENERGAN) IVPB  6.25 mg Once   . carvedilol  25 mg BID   . morphine  4 mg Once   . ondansetron  4 mg Once   . morphine  8 mg Once   . morphine  8 mg Once   . HYDROmorphone  1 mg Once   . bolus IV fluid   Once   . lidocaine  10 mL Once   . ondansetron  4 mg Once   . aluminum-magnesium-simethicone  30 mL Once   . amLODIPINE  10 mg Daily   . clonidine  0.3 mg TID   . sodium chloride  3 mL Q8H   . ciprofloxacin (CIPRO) IVPB  400 mg Q12H   . metroNIDAZOLE  500 mg Q8H   . lisinopril  40 mg Daily   . DISCONTD: aluminum-magnesium  30 mL Once   . DISCONTD: carvedilol  20.3125 mg BID   . DISCONTD: lisinopril  40 mg BID     IV Meds:      . sodium chloride     . sodium chloride 75 mL/hr at 04/09/11 0310   . DISCONTD: sodium chloride 150 mL/hr at 04/08/11 1559     PRN Meds:      . sodium chloride  3 mL PRN   . sodium chloride   Continuous PRN   . acetaminophen  650 mg Q4H PRN   . HYDROmorphone  0.5 mg Q2H PRN   . HYDROmorphone  1 mg Q2H PRN   . ondansetron (ZOFRAN) IVPB  8 mg Q8H PRN   . hydrALAZINE  10 mg Q6H PRN   . zolpidem  5 mg Nightly PRN       Objective:  Vital Signs:  Latest Entry Range (last 24 hours)  Temperature: 98.5 F (36.9 C) Temp  Avg: 98.7 F (37.1 C)  Min: 98.5 F (36.9 C)  Max: 98.9 F (37.2 C)  Blood pressure (BP): 155/98 mmHg BP  Min: 147/100  Max: 169/113  Heart Rate: 89  Pulse  Avg: 90.7   Min: 80   Max: 100   Respirations: 18  Resp  Avg: 18.1   Min: 15   Max: 20   SpO2: 98 % SpO2  Avg: 99.3 %  Min: 98 %  Max: 100 %      Intake/Output (24hrs):  08/28 0600 - 08/29 0559  In: 2500 [I.V.:2500]  Out: -     Physical Exam:  Gen: Well appearing, WNWD, NAD  Neuro: A+O  x3, nonfocal  HEENT: OP clear, mucosa moist, PERRL  Lung: CTA bila, no c/w/r  CV: RRR, no m/r/g  Abd: +BS decreased, obese abdomen, soft,  No masses, no HSM, tender  Palpation LLQ abdomen  Ext: no edema, clubbing, or cyanosis  Skin: no rash      Laboratory data:   Recent Labs     Lab Results   Component Value Date    NA 139 04/09/2011    K 3.2 04/09/2011    CL 99 04/09/2011    BICARB 24 04/09/2011    BUN 5 04/09/2011  CREAT 0.46 04/09/2011    GLU 134 04/09/2011     8.9 04/09/2011     Lab Results   Component Value Date    WBC 11.6 04/09/2011    RBC 4.78 04/09/2011    HGB 13.6 04/09/2011    HCT 39.4 04/09/2011    MCV 82.4 04/09/2011    MCHC 34.5 04/09/2011    RDW 13.3 04/09/2011    PLT 260 04/09/2011    PLT 219 05/21/2009    MPV 9.8 04/09/2011       Imaging   04/08/11   IMPRESSION: CT scan with IV contrast  1. Unremarkable CT scan of the abdomen and pelvis. No findings to suggest a   colitis. No evidence of perforation or free air.   2. Normal appearance of the vasculature.        Assessment and Care Plan:   Katherine Church is a 45 year old woman with a Hx of DM, HTN and ischemic colitis, now presenting with 5 days of constant RLQ pain and pressure associated nausea, thick dark BM's, absent bowel sounds on physical examination and an elevated WBC concerning for ischemic colitis vs. Mesenteric ischemia vs. Bowel obstriction IBD of cecal area.     #Abdominal pain- concerning for Ischemic bowel Event, obstruction or inflammation. WBC trending down, still has nausea and RLQ pain.    - I.V fluids   -appreciate G.I recs  -possible flex sig today, will most likely need colonoscopy   -start cipro, flagyl   -N.P.O   -Stool studies- guaiac, c. Diff toxin, culture, Ovum parasite.   -Zofran for alleviation of nausea and vomiting        #HTN-Continue Home meds   -Clonidine 0.3mg  T.I.D   Amlodipine 10mg    -carvedilol 25mg  B.I.D   -   #Diabetis-   SSI q6 checks   -d/c glipizide     #PPx- hold ppx pt until G.I bleed ruled out.     Code Status:   Full  Code      Malachy Mood, MD  Internal Medicine PGY-1  Pgr [phone]      ATTENDING PROGRESS NOTE ATTESTATION    Subjective    Patient seen, discussed, examined with team on rounds today, chart, medications, labs reviewed.  Please see resident note for details.      Interval history: Patient continues to have nausea/vomiting with abdominal discomfort.  No appetite.     Objective  Blood pressure 110/53, pulse 85, temperature 99 F (37.2 C), resp. rate 20, height 5\' 4"  (1.626 m), weight 78.5 kg (173 lb 1 oz), last menstrual period 01/10/2011, SpO2 95.00%.    I have examined the patient and concur with the resident/fellow/NP/PA exam.  Gen: A&Ox3 mild distress/anxiety secondary to pain  Lungs; CTAB  CV: RR w/o M/R/G nl s1 s2  Abd: soft, mild distention w/ persistent unchanged ttp RLQ  Ext: no edema    Lab Results   Component Value Date    WBC 12.7 04/09/2011    RBC 4.43 04/09/2011    HGB 12.7 04/09/2011    HCT 36.1 04/09/2011    MCV 81.5 04/09/2011    MCHC 35.2 04/09/2011    RDW 13.3 04/09/2011    PLT 214 04/09/2011    PLT 219 05/21/2009    MPV 9.3 04/09/2011     Lab Results   Component Value Date    BUN 5 04/09/2011    CREAT 0.46 04/09/2011    CL 99 04/09/2011    NA 139 04/09/2011  K 3.2 04/09/2011    [place] 8.9 04/09/2011    TBILI 0.6 04/09/2011    ALB 4.5 04/09/2011    TP 8.1 04/09/2011    AST 18 04/09/2011    ALK 73 04/09/2011    BICARB 24 04/09/2011    ALT 19 04/09/2011    GLU 134 04/09/2011       Assessment and Plan    I agree with the resident/fellow/NP/PA care plan.    See the resident / fellow note for further details.    45 yo F with history of ischemic colitis who presents with increased abdominal pain concerning for possible recurrent ischemic, infectious, inflammatory colitis.     1.  Appreciate GI support - possible flex sig/EGD today  2.  Continue IV fluids NPO, PPI IV BID, anti-nausea meds  3.  Continue hypertension meds w/ hold parameters  4.  Stool studies  5.  Continue ISS for DM.     Gwynneth Munson, MD  Hospital Medicine  Attending  PID [phone]

## 2011-04-09 NOTE — Interdisciplinary (Signed)
Pt aware of need for stool specimen. She has not had a BM since admission. Will continue to wait.

## 2011-04-09 NOTE — Plan of Care (Signed)
Problem: Tissue Perfusion - Cardiopulmonary, Altered  Goal: Hemodynamic stability  Outcome: Met  Patient's vital signs are WNL for this am, patient is NSR on tele monitor, patient's VS are being checked Q 4 hours, will continue to monitor patient.  Goal: Early recognition of deterioration  Outcome: Met  Patient is A O X 4, no neurological deficits are noted, will continue to monitor patient.    Problem: Falls - Risk of  Goal: Knowledge of fall prevention  Outcome: Met  Fall and safety precautions are in place, patient verbalized she "feels weak from not eating and N/V upon getting up" patient is informed to call prn for assistance prn prior to getting out of bed, bs commode placed near patient, bedside table with essentials and call light are within reach for patient, bed alarm is set, will continue to monitor patient.    Problem: Discharge Planning  Goal: Participation in care planning  Outcome: Not Met  Patient has no current orders for discharge, will continue to monitor patient.  Goal: Verbalizes/demonstrates knowledge of discharge instructions  Outcome: Not Met  Patient has no current orders for discharge, will continue to monitor patient.  Goal: Verbalize knowledge of prescribed medication management plan  Outcome: Not Met  Patient has no current orders for discharge, will continue to monitor patient.  Goal: Able to perform ADL- independently or with minimal assist  Outcome: Met  Patient is abel to perform ADL without any assitance.    Problem: Pain - Acute  Goal: Control of acute pain  Outcome: Met  Patient is medicated for abdominal pain this am 9/1O with 1 mg Dilaudid. Patient upon reassessment is verbalizing pain at "toelrable level", will continue to  Monitor patient closely.

## 2011-04-09 NOTE — Telephone Encounter (Signed)
Reviewed chart. Pt triaged to ED yesterday 04/08/11. Pt admitted [facility] Acute Ischemic Colitis.  Case Management will f/u with pt.    FYI Dr.Sani  and Judeth Cornfield

## 2011-04-09 NOTE — Interdisciplinary (Signed)
[facility] is sending a message to: Jettie Booze / [phone]   RE:1021B Budge,Lear fyi hypokalemic at 3.2 this am. Pt npo. Please order IV potassium replacement. Thank you, Broadus John [phone]

## 2011-04-10 LAB — BASIC METABOLIC PANEL, BLOOD
BUN: 6 mg/dL (ref 6–20)
Bicarbonate: 23 mmol/L (ref 22–29)
Calcium: 8.6 mg/dL (ref 8.6–10.5)
Chloride: 103 mmol/L (ref 98–107)
Creatinine: 0.45 mg/dL — ABNORMAL LOW (ref 0.51–0.95)
Glucose: 115 mg/dL (ref 70–115)
Potassium: 3.5 mmol/L (ref 3.5–5.1)
Sodium: 140 mmol/L (ref 136–145)

## 2011-04-10 LAB — CBC WITH DIFF, BLOOD
Abs Eosinophils: 0.2 10*3/uL (ref 0.0–0.5)
Abs Lymphs: 1.6 10*3/uL (ref 0.8–3.1)
Abs Monos: 0.4 10*3/uL (ref 0.2–0.8)
Absolute Neutrophil Count: 7.2 10*3/uL — ABNORMAL HIGH (ref 1.6–7.0)
Eosinophils: 2 % (ref 1–7)
Hct: 36.6 % (ref 34.0–45.0)
Hgb: 12.4 gm/dL (ref 11.2–15.7)
Lymphocytes: 17 % — ABNORMAL LOW (ref 19–53)
MCH: 28.2 pg (ref 26.0–32.0)
MCHC: 33.9 % (ref 32.0–36.0)
MCV: 83.4 um3 (ref 79.0–95.0)
MPV: 9.4 fL (ref 9.4–12.4)
Monocytes: 5 % (ref 5–12)
Plt Count: 188 10*3/uL (ref 140–370)
RBC: 4.39 10*6/uL (ref 3.90–5.20)
RDW: 13.4 % (ref 12.0–14.0)
Segs: 76 % — ABNORMAL HIGH (ref 34–71)
WBC: 9.5 10*3/uL (ref 4.0–10.0)

## 2011-04-10 LAB — C-REACTIVE PROTEIN, BLOOD: CRP: 4.4 mg/dL — ABNORMAL HIGH (ref <0.5)

## 2011-04-10 LAB — GFR: GFR: >60 mL/min

## 2011-04-10 LAB — GLUCOSE POCT, BLOOD: Meter Glucose (POC): 152 mg/dL — ABNORMAL HIGH (ref 70–115)

## 2011-04-10 NOTE — Plan of Care (Signed)
Problem: Tissue Perfusion - Cardiopulmonary, Altered  Goal: Hemodynamic stability  Outcome: Met  Pt remain alert oriented times 4 verbally responsive, denies chest pain, on continues telemetry an vitals signs monitoring,on tele SR and her vital signs were stable denies chest pain.    Problem: Falls - Risk of  Goal: Absence of falls  Outcome: Met  NO fall or injury noted, call light with in easy reach and answered promptly, bed on lowest position with bed brakes on,2 upper bed  rails are up and pt is checked hourly too. With steady gait uses non skid socks.    Problem: Discharge Planning  Goal: Participation in care planning  Outcome: Met  No discharges order noted, participating will on her care.    Problem: Pain - Acute  Goal: Communication of presence of pain  Outcome: Met  Pt is able to verbalized pain needs which are met and anticipated had c/o pain at 0430 was given dilaudid as order pain rate 7/10 pt verbalized helped of pain was able to sleep and rest too,will continue to monitor pt pain.    Problem: Nausea/Vomiting  Goal: Absence of nausea  Outcome: Not Met  Still with noted nausea was given phenergan prn at 2200 and was helped after will continue to monitor pt poc.

## 2011-04-10 NOTE — Progress Notes (Addendum)
Medicine Intern Progress Note: 04/09/2011     Current Hospital Stay:   1 day - Admitted on: 04/08/2011    Subjective: Yesterday Katherine Church was unable to have her Colonoscopy + EGD, NF increased lisinopril         Katherine Church looked  uncomfortable when I saw her, had brown BM this morning also underwent EGD, flex sig this morning  still has pain cramping in RLQ pain. Received femagram last night relieved feelings of nasua and vomiting.       Medications:      . promethazine (PHENERGAN) IVPB  6.25 mg Once   . carvedilol  25 mg BID   . morphine  4 mg Once   . ondansetron  4 mg Once   . morphine  8 mg Once   . morphine  8 mg Once   . HYDROmorphone  1 mg Once   . bolus IV fluid   Once   . lidocaine  10 mL Once   . ondansetron  4 mg Once   . aluminum-magnesium-simethicone  30 mL Once   . amLODIPINE  10 mg Daily   . clonidine  0.3 mg TID   . sodium chloride  3 mL Q8H   . ciprofloxacin (CIPRO) IVPB  400 mg Q12H   . metroNIDAZOLE  500 mg Q8H   . lisinopril  40 mg Daily   . DISCONTD: aluminum-magnesium  30 mL Once   . DISCONTD: carvedilol  20.3125 mg BID   . DISCONTD: lisinopril  40 mg BID     IV Meds:      . sodium chloride     . sodium chloride 75 mL/hr at 04/09/11 0310   . DISCONTD: sodium chloride 150 mL/hr at 04/08/11 1559     PRN Meds:      . sodium chloride  3 mL PRN   . sodium chloride   Continuous PRN   . acetaminophen  650 mg Q4H PRN   . HYDROmorphone  0.5 mg Q2H PRN   . HYDROmorphone  1 mg Q2H PRN   . ondansetron (ZOFRAN) IVPB  8 mg Q8H PRN   . hydrALAZINE  10 mg Q6H PRN   . zolpidem  5 mg Nightly PRN       Objective:  Vital Signs:  Latest Entry Range (last 24 hours)  Temperature: 98.5 F (36.9 C) Temp  Avg: 98.7 F (37.1 C)  Min: 98.5 F (36.9 C)  Max: 98.9 F (37.2 C)  Blood pressure (BP): 155/98 mmHg BP  Min: 147/100  Max: 169/113  Heart Rate: 89  Pulse  Avg: 90.7   Min: 80   Max: 100   Respirations: 18  Resp  Avg: 18.1   Min: 15   Max: 20   SpO2: 98 % SpO2  Avg: 99.3 %  Min: 98 %  Max: 100 %      Intake/Output  (24hrs):  08/28 0600 - 08/29 0559  In: 2500 [I.V.:2500]  Out: -     Physical Exam:  Gen: Well appearing, WNWD, NAD  Neuro: A+O x3, nonfocal  HEENT: OP clear, mucosa moist, PERRL  Lung: CTA bila, no c/w/r  CV: RRR, no m/r/g  Abd: +BS decreased, obese abdomen, soft,  No masses, no HSM, tender  Palpation LLQ abdomen  Ext: no edema, clubbing, or cyanosis  Skin: no rash      Laboratory data:   Recent Labs     Lab Results   Component Value Date  NA 140 04/10/2011    K 3.5 04/10/2011    CL 103 04/10/2011    BICARB 23 04/10/2011    BUN 6 04/10/2011    CREAT 0.45 04/10/2011    GLU 115 04/10/2011    [place] 8.6 04/10/2011     Lab Results   Component Value Date    WBC 9.5 04/10/2011    RBC 4.39 04/10/2011    HGB 12.4 04/10/2011    HCT 36.6 04/10/2011    MCV 83.4 04/10/2011    MCHC 33.9 04/10/2011    RDW 13.4 04/10/2011    PLT 188 04/10/2011    PLT 219 05/21/2009    MPV 9.4 04/10/2011       Imaging   04/08/11   IMPRESSION: CT scan with IV contrast  1. Unremarkable CT scan of the abdomen and pelvis. No findings to suggest a   colitis. No evidence of perforation or free air.   2. Normal appearance of the vasculature.      8/30 Flex sig   Recommendations: Follow biopsy results, other   recommendations per in-patient GI consultation note.   Should get complete colonoscopy given small polyp   appearances in left colon, unsatisfactory prep for   colonsocopy today and RLQ pain, this can only be done   once patient can tolerate preparation (in or out patient okay)      Assessment and Care Plan:   Katherine Church is a 45 year old woman with a Hx of DM, HTN and ischemic colitis, now presenting with 5 days of constant RLQ pain and pressure associated nausea, thick dark BM's, absent bowel sounds on physical examination and an elevated WBC concerning for ischemic colitis vs. Mesenteric ischemia vs. Bowel obstriction IBD of cecal area.     #Abdominal pain- concerning for Ischemic bowel Event, obstruction or inflammation. WBC trending down, still has nausea and RLQ  pain.    - I.V fluids   -appreciate G.I recs  -possible flex sig today, will most likely need colonoscopy   -start cipro, flagyl   -N.P.O   -Stool studies- guaiac, c. Diff toxin, culture, Ovum parasite.   -Zofran for alleviation of nausea and vomiting        #HTN-Continue Home meds   -Clonidine 0.3mg  T.I.D   Amlodipine 10mg    -carvedilol 25mg  B.I.D   -lisinopril 40mg  B.I.D  -   #Diabetis-   SSI q6 checks   -d/c glipizide     #PPx- hold ppx pt until G.I bleed ruled out.     Code Status:   Full Code      Malachy Mood, MD  Internal Medicine PGY-1  Pgr [phone]        ATTENDING PROGRESS NOTE ATTESTATION    Subjective    Patient seen, discussed, examined with team on rounds today, chart, medications, labs reviewed.  Please see resident note for details.      Interval history: Patient just back from EGD/flex sig, with minimal abdominal discomfort and nausea.  No other acute complaints.     Objective  Blood pressure 129/84, pulse 95, temperature 97.8 F (36.6 C), resp. rate 18, height 5\' 4"  (1.626 m), weight 78.5 kg (173 lb 1 oz), last menstrual period 01/10/2011, SpO2 98.00%.    I have examined the patient and concur with the resident/fellow/NP/PA exam.  Persistent RLQ abdominal ttp w/o guarding/rebound    Assessment and Plan    I agree with the resident/fellow/NP/PA care plan.    See the resident / fellow note for  further details.    45 yo F with history of ischemic colitis who presents with increased abdominal pain concerning for possible recurrent ischemic, infectious, inflammatory colitis.     1. Appreciate GI support -  Follow up flex sig/EGD today   2. Continue IV fluids PPI IV BID, anti-nausea meds   3. Continue hypertension meds w/ hold parameters   4. Stool studies pending  5. Continue ISS for DM  6.  Pain control w/ IV medication as needed.     Gwynneth Munson, MD  Hospital Medicine Attending  PID [phone]

## 2011-04-10 NOTE — Plan of Care (Signed)
Problem: Nausea/Vomiting  Goal: Absence of nausea  Outcome: Met  Pt is going to GI lab for procedure. NPO since MN. Pre-procedural check list completed by RN. Blood sugar 137. Vital signs stable. Complaints of headache pain. Administered Dilaudid for HA pain and persistent abdominal discomfort. AM antibiotic held for procedure. Transport via gurney. IV SL. RA.
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
## 2011-04-10 NOTE — Progress Notes (Addendum)
Medicine Intern Progress Note: 04/11/2011     Current Hospital Stay:   3 days - Admitted on: 04/08/2011    Overnight events: Spiked a fever last night cultures sent HD stable, gave 500 cc bolus  Subjective:      Medications:      . DISCONTD: bolus IV fluid   Once   . carvedilol  25 mg BID   . pantoprazole  40 mg Q12H   . lisinopril  40 mg BID   . amLODIPINE  10 mg Daily   . clonidine  0.3 mg TID   . sodium chloride  3 mL Q8H   . ciprofloxacin (CIPRO) IVPB  400 mg Q12H   . metroNIDAZOLE  500 mg Q8H     IV Meds:      . sodium chloride     . sodium chloride 75 mL/hr at 04/09/11 0310     PRN Meds:      . LORazepam  2 mg Q6H PRN   . promethazine (PHENERGAN) IVPB  6.25 mg Q6H PRN   . calcium carbonate  500 mg Q3H PRN   . sodium chloride  3 mL PRN   . sodium chloride   Continuous PRN   . acetaminophen  650 mg Q4H PRN   . HYDROmorphone  0.5 mg Q2H PRN   . HYDROmorphone  1 mg Q2H PRN   . ondansetron (ZOFRAN) IVPB  8 mg Q8H PRN   . hydrALAZINE  10 mg Q6H PRN   . zolpidem  5 mg Nightly PRN       Objective:  Vital Signs:  Latest Entry Range (last 24 hours)  Temperature: 98.3 F (36.8 C) Temp  Avg: 98.7 F (37.1 C)  Min: 97.6 F (36.4 C)  Max: 101.3 F (38.5 C)  Blood pressure (BP): 129/81 mmHg BP  Min: 97/75  Max: 161/96  Heart Rate: 79  Pulse  Avg: 89.4   Min: 75   Max: 112   Respirations: 16  Resp  Avg: 18.5   Min: 16   Max: 20   SpO2: 95 % SpO2  Avg: 97.5 %  Min: 95 %  Max: 98 %    Physical Exam:  Gen: Well appearing, WNWD, NAD  Neuro: A+O x3, nonfocal  HEENT: OP clear, mucosa moist, PERRL  Lung: CTA bila, no c/w/r  CV: RRR, no m/r/g  Abd: +BS decreased, obese abdomen, soft, No masses, no HSM, tender Palpation LLQ abdomen  Ext: no edema, clubbing, or cyanosis  Skin: no rash      Intake/Output (24hrs):  08/30 0600 - 08/31 0559  In: 1240 [P.O.:1240]  Out: 4000 [Urine:4000]          Laboratory data:   Recent Labs   Basename 04/10/11 0742 04/09/11 0821 04/08/11 1415    NA 140 139 137    K 3.5 3.2* 3.9    CL 103 99 101     BICARB 23 24 19*    BUN 6 5* 8    CREAT 0.45* 0.46* 0.47*    GLU 115 134* 101    [place] 8.6 8.9 10.6*    MG -- 1.9 --    PHOS -- -- --    AST -- 18 26    ALT -- 19 21    ALK -- 73 81    TBILI -- 0.6 0.5    PTT -- 30.7 --    INR -- 1.1 --     Recent Labs   [facility] 04/10/11  [id number] 04/09/11 1814 04/09/11 0821 04/08/11 1259    WBC 9.5 12.7* 11.6* 17.6*    HGB 12.4 12.7 13.6 15.2    HCT 36.6 36.1 39.4 42.9    PLT 188 214 260 297    BAND -- -- -- --    SEG 76* 68 71 62    LYMPHS 17* 25 22 34    MONO -- -- -- --       Micro:  Lab Results   Component Value Date    BLOODCULT No Growth after 5 day/s of incubation. 09/04/2009    BLOODCULT No Growth after 5 day/s of incubation. 09/04/2009     Stool studies   c diff neg  Stool cutlures pending  Stool WBC positive    Colonoscopy   Endoscopic Diagnosis: - Enema prep.   - GIF H180 scope advanced to appearances of cecum,   can not confirm as solid impacted stool can not be cleared.   - Normal appearing mucosa in left colon mostly (although   stool covering mucosa, once cleared with irrigation,   appeared healthy underneath) except splenic flexture area   (35 cm-40 cm proximal to anal verge) where there is   erythema, also diverticula observed in this area. S/P   biopsies.   - Slight rectal erythema, s/p biopsies   - Right colon mucosa appears healthy (see above for extent   of exam), s/p biopsies   - Retroflexion not done as can not hold in air   Recommendations: Follow biopsy results, other   recommendations per in-patient GI consultation note.   Should get complete colonoscopy given small polyp   appearances in left colon, unsatisfactory prep for   colonsocopy today and RLQ pain, this can only be done   once patient can tolerate preparation (in or out patient okay)    EGD  Endoscopic Diagnosis: - Normal esophagus except GE   junction that had erythema and slight nodularity s/p biopsies   (likely from N/V)   - Slight gastric erythema but no ulcers, no old or fresh blood   seen. Biopsies  taken   - Normal appearing duodenum, s/p biopsies   Recommendations: Follow up biopsy results. Other   recommendations per GI inpatient consultation note and   see colonoscopy report.    Path results pending    Assessment and Care Plan:   Ms. Philbert is a 45 year old woman with a Hx of DM, HTN and ischemic colitis, now presenting with 5 days of constant RLQ pain and pressure associated nausea, thick dark BM's, absent bowel sounds on physical examination and an elevated WBC concerning for ischemic colitis vs. Mesenteric ischemia vs. Bowel obstriction IBD of cecal area.     #Abdominal pain- concerning for Ischemic bowel Event, obstruction or inflammation. WBC trending down, still has nausea and RLQ pain.    - I.V fluids   -appreciate G.I recs  -awaiting fnal path results   -cont. cipro, flagyl   -advance diet   -Switch to PO meds, PPI  - awaiting results for Stool studies- guaiac, c. Diff toxin,   culture, Ovum parasite.        #HTN-Continue Home meds   -Clonidine 0.3mg  T.I.D   Amlodipine 10mg    -carvedilol 25mg  B.I.D   -lisinopril 40mg  B.I.D  -   #Diabetis  SSI q6 checks   -d/c glipizide     FEN-  Bland diet     #PPx- heparin    Code Status:   Full Code  Malachy Mood, MD  Internal Medicine PGY-1  Pgr [phone]    ATTENDING PROGRESS NOTE ATTESTATION    Subjective    Patient seen, discussed, examined with team on rounds today, chart, medications, labs reviewed.  Please see resident note for details.      Interval history: No acute complaints, still feels miserable, was noted to have fever overnight, no acute cough, still persistent abdominal discomfort but tolerating diet better.     Objective  Blood pressure 118/73, pulse 85, temperature 98.3 F (36.8 C), resp. rate 18, height 5\' 4"  (1.626 m), weight 78.5 kg (173 lb 1 oz), last menstrual period 01/10/2011, SpO2 98.00%.    I have examined the patient and concur with the resident/fellow/NP/PA exam.    Assessment and Plan    I agree with the resident/fellow/NP/PA care  plan.    See the resident / fellow note for further details.    45 yo F with history of ischemic colitis who presents with increased abdominal pain concerning for possible recurrent ischemic, infectious, inflammatory colitis.    1.  Abdominal pain:  Unclear definitive source, although still concerning for possible ischemic, infectious or inflammatory bowel disease.   - Appreciate GI support - Follow up flex sig/EGD biopsies  -Await stool studies  -Continue IV fluids PPI IV BID, anti-nausea meds   -Pain control w/ IV medication as needed.   -ADAT    2.  HTN:  Continue hypertension meds w/ hold parameters.  Restart outpatient doses of lisinopril.      3.  DM:  Continue ISS with QAC/HS accuchecks.       Gwynneth Munson, MD  Hospital Medicine Attending  PID [phone]

## 2011-04-10 NOTE — Plan of Care (Addendum)
Pt c/o pain but bp is low 95/72 p=81 and md was notified and waiting for his response.  1202 md responded and told her that i will give the pain med because pt bp up to 120/86 p=76 , pain level still at 7/10 and 1 mg of dilaudid was given iv. At 1206.   1235 pt reassessed and stated that her pain is better and resting comfortably in bed.

## 2011-04-11 LAB — BASIC METABOLIC PANEL, BLOOD
BUN: 8 mg/dL (ref 6–20)
Bicarbonate: 26 mmol/L (ref 22–29)
Calcium: 9.2 mg/dL (ref 8.6–10.5)
Chloride: 101 mmol/L (ref 98–107)
Creatinine: 0.46 mg/dL — ABNORMAL LOW (ref 0.51–0.95)
Glucose: 136 mg/dL — ABNORMAL HIGH (ref 70–115)
Potassium: 3.5 mmol/L (ref 3.5–5.1)
Sodium: 137 mmol/L (ref 136–145)

## 2011-04-11 LAB — HEMOGRAM, BLOOD
Hct: 35.4 % (ref 34.0–45.0)
Hgb: 12.1 gm/dL (ref 11.2–15.7)
MCH: 28.5 pg (ref 26.0–32.0)
MCHC: 34.2 % (ref 32.0–36.0)
MCV: 83.3 um3 (ref 79.0–95.0)
MPV: 9.8 fL (ref 9.4–12.4)
Plt Count: 203 10*3/uL (ref 140–370)
RBC: 4.25 10*6/uL (ref 3.90–5.20)
RDW: 13.5 % (ref 12.0–14.0)
WBC: 9.7 10*3/uL (ref 4.0–10.0)

## 2011-04-11 LAB — GLUCOSE POCT, BLOOD: Meter Glucose (POC): 137 mg/dL — ABNORMAL HIGH (ref 70–115)

## 2011-04-11 LAB — SED RATE, BLOOD: Sed Rate: 26 mm/hr — ABNORMAL HIGH (ref 0–20)

## 2011-04-11 LAB — LACTATE, BLOOD: Lactate: 14.4 mg/dL (ref 4.5–19.8)

## 2011-04-11 LAB — GLYCOSYLATED HGB(A1C), BLOOD: Glyco Hgb (A1C): 6.6 % — ABNORMAL HIGH (ref 4.8–5.9)

## 2011-04-11 LAB — GFR: GFR: >60 mL/min

## 2011-04-11 MED ORDER — INSULIN LISPRO (HUMAN) 100 UNIT/ML SC SOLN (CUSTOM)
1.0000 [IU] | Freq: Four times a day (QID) | INTRAMUSCULAR | Status: DC
Start: 2011-04-11 — End: 2011-04-19
  Administered 2011-04-11: 12:00:00 1 [IU] via SUBCUTANEOUS
  Filled 2011-04-11 (×6): qty 1

## 2011-04-11 MED ORDER — DEXTROSE (DIABETIC USE) 40 % OR GEL
1.0000 | ORAL | Status: DC | PRN
Start: 2011-04-11 — End: 2011-04-19

## 2011-04-11 MED ORDER — HEPARIN SODIUM (PORCINE) 10000 UNIT/ML IJ SOLN
5000.0000 [IU] | Freq: Three times a day (TID) | INTRAMUSCULAR | Status: DC
Start: 2011-04-11 — End: 2011-04-16
  Administered 2011-04-12: 5000 [IU] via SUBCUTANEOUS
  Filled 2011-04-11 (×15): qty 0.5

## 2011-04-11 MED ORDER — DEXTROSE 50 % IV SOLN
12.5000 g | INTRAVENOUS | Status: DC | PRN
Start: 2011-04-11 — End: 2011-04-19

## 2011-04-11 MED ORDER — METRONIDAZOLE 500 MG OR TABS
500.0000 mg | ORAL_TABLET | Freq: Three times a day (TID) | ORAL | Status: DC
Start: 2011-04-11 — End: 2011-04-13
  Administered 2011-04-11: 500 mg via ORAL
  Filled 2011-04-11 (×7): qty 1

## 2011-04-11 MED ORDER — SODIUM CHLORIDE 0.9 % IV SOLN
Freq: Once | INTRAVENOUS | Status: DC
Start: 2011-04-11 — End: 2011-04-11

## 2011-04-11 MED ORDER — GLUCOSE 4 GM PO CHEW (CUSTOM)
4.0000 | CHEWABLE_TABLET | ORAL | Status: DC | PRN
Start: 2011-04-11 — End: 2011-04-19

## 2011-04-11 MED ORDER — GLUCAGON HCL (RDNA) 1 MG IJ SOLR
1.0000 mg | Freq: Once | INTRAMUSCULAR | Status: DC | PRN
Start: 2011-04-11 — End: 2011-04-19

## 2011-04-11 MED ORDER — CIPROFLOXACIN HCL 500 MG OR TABS
500.0000 mg | ORAL_TABLET | Freq: Two times a day (BID) | ORAL | Status: DC
Start: 2011-04-11 — End: 2011-04-13
  Filled 2011-04-11 (×4): qty 1

## 2011-04-11 NOTE — Plan of Care (Signed)
Problem: Tissue Perfusion - Cardiopulmonary, Altered  Goal: Hemodynamic stability  Outcome: Met  Pt remain alert oriented times 4 verbally responsive, she is on continues telemetry and vital sign monitoring ,on tele SR-ST,with slightly elevated BP at time MD aware  That earlier this shift her systolic BP above 150 and diastolic BP had been above 90's no order received just continue to monitor pt routine BP meds had been given too and by 2300 bp had been with in normal.pt is asymptomatic and had denies chest pain.    Problem: Falls - Risk of  Goal: Absence of falls  Outcome: Met  NO fall or injury noted, call light with in easy reach and answered promptly, bed on lowest position with bed brakes on,2 upper bed  rails are up and pt is checked hourly too.     Problem: Discharge Planning  Goal: Participation in care planning  Outcome: Met  Pt is participating will on her plan of care,no discharges order noted.    Problem: Pain - Acute  Goal: Communication of presence of pain  Outcome: Met  Shes able to communicate pain needs which are met and anticipated.at 1800 was given by am nurse dilaudid for pain and was helped pain rate when assess at the beginning of the shift was 4/10 pt stated tolerable but at 2051 she was anxious with pain rate 10/10 was given 1 mg of dilaudid plus po ativan as ordered. Then after an hour she had calm down pain rate 4/10,zopidem was given later, was able to sleep and rest will continue to monitor pt pain.    Problem: Nausea/Vomiting  Goal: Absence of nausea  Outcome: Not Met  Pt had at the beginning of the shift complain of being nauseated bloated and was given pantoprazole and phenergan as ordered. MD was also made aware of her complain, pt later was bale to sleep and rest.will continue to monitor pt no vomiting noted.    Problem: Infection  Goal: Absence of infection signs and symptoms  Outcome: Not Met  Pt was assess for signs and symptoms of infections, at 2141 she stated she felt warm she  temp was rechecked was 101.3 tylenol was give and MD first to call was notified pager [phone] and MD came and examine pt had ordered blood culture times 2 and urine culture too. Pt is also on flagyl and cipro antibiotics. Temp rechecked was 98.9.willconitnue to monitor poc.

## 2011-04-11 NOTE — Interdisciplinary (Signed)
Initial Nutrition Assessment: Pt assessed at mild nutritional risk.   A: 45 year old woman with a Hx of DM, HTN and ischemic colitis, now presenting with 5 days of constant RLQ pain and pressure associated nausea, thick dark BM's, absent bowel sounds on physical examination and an elevated WBC concerning for ischemic colitis vs. Mesenteric ischemia vs. Bowel obstriction IBD of cecal area.   Height:64"  Weight:173#  BMI:29.7 IBW: 120#, @142 %  Diet Order: Low Residue   PO Intake:75-100% despite abd pain and nausea, which has been relieved with meds.  I/O: 1240/4000  Skin: WDL per head to toe assessment  GI:x2 BM 8/30  Labs: creat .45, ALB 4.5, A1c 7.4 (01/21/11), fasting bs: 152, 137, 187  Meds: noted  Plan/Recommendations:  1. Continue Low Residue die. REC adding CHO Ltd to diet order.   2. REC start checking POCBS more often for trend.   3. REC check A1c again  4. Continue antiemetics  Recommendations relayed to team.   Discharge plan: pending clinical course  Education: when/if appropriate  Elicia Lamp, RD, Pager [phone]

## 2011-04-11 NOTE — Plan of Care (Signed)
Problem: Tissue Perfusion - Cardiopulmonary, Altered  Goal: Hemodynamic stability  Outcome: Met  Pt alert and oriented  , tele shows  Nsr, assessed  For chestpain but pt denied, no sob,breath sounds clear , v/s monitored and recorded,2 views chest xray ordered   Done, remians stable, chestpain ,will   Continue to monitor .

## 2011-04-11 NOTE — Plan of Care (Signed)
Problem: Falls - Risk of  Goal: Absence of falls  Outcome: Met  No falls or injuries noted

## 2011-04-11 NOTE — Plan of Care (Signed)
Problem: Infection  Goal: Absence of infection signs and symptoms  Outcome: Not Met  Remains with  Po and iv antibiotics, v/s monitored and recorded, repeat  Chest xray 2 views done this afternoon, pending result,   Will continue  Same treatment and continue to monitor, latest WBC  9.5, yesterday was 12.7.

## 2011-04-11 NOTE — Plan of Care (Signed)
Problem: Pain - Acute  Goal: Communication of presence of pain  Outcome: Met  Pt able to cummunicate  If with pain, call light within reach.  Goal: Control of acute pain  Outcome: Not Met  Pt has been complaining of  Headache   And abdominal pain constantly, been medicated with dilaudid   i mg iv   Prn   With temporary relief,  , see med flowsheet,   Kept comfortable.  ,will continue same treatment and continue to monitor.

## 2011-04-11 NOTE — Progress Notes (Signed)
Daily Progress Note:  04/11/2011     Current Hospital Stay:   3 days - Admitted on: 04/08/2011    CC: abdominal pain    Subjective:  Ongoing RLQ abdominal pain  Fever last night, but none since  Improved nausea    Objective:  Vital Signs:  Temperature:  [97.2 F (36.2 C)-98.7 F (37.1 C)] 98.3 F (36.8 C) (08/31 1610)  Blood pressure (BP): (109-136)/(73-95) 128/91 mmHg (08/31 2157)  Heart Rate:  [79-98] 84  (08/31 2157)  Respirations:  [16-18] 18  (08/31 1610)  Pain Score:  [-] 8 (08/31 2044)  O2 Device:  [-] None (Room air) (08/31 0813)  SpO2:  [95 %-98 %] 98 % (08/31 0813)  Physical Exam:  General: Ill appearing   HEENT: Op clear, anicteric   Neck: Full rom   Lungs: ctab   CV: Tachy, regular   Abdomen: Soft, diffuse ttp but >>RLQ; no rebound or guarding   Extremities: Nl joints without effusions   Neuro: nonfocal    Laboratory data:   Lab Results   Component Value Date    NA 137 04/11/2011    K 3.5 04/11/2011    CL 101 04/11/2011    BICARB 26 04/11/2011    BUN 8 04/11/2011    CREAT 0.46* 04/11/2011    GLU 136* 04/11/2011    [place] 9.2 04/11/2011     Lab Results   Component Value Date    WBC 9.7 04/11/2011    HGB 12.1 04/11/2011    HCT 35.4 04/11/2011    PLT 203 04/11/2011     No results found for this basename: AST, ALT, ALK, TBILI, DBILI, TP, ALB     Labs reviewed.     Assessment and Plan:    Katherine Willhelm is a 45 year old female with hx of DM, HTN and episode of ischemic colitis in 2011, presenting with RLQ abdominal pain, nausea and what may be melena vs hematochezia. Patient did have ischemic colitis in 2011 and is having similar sx now, but no CT evidence of disease and pain is in the RLQ at this time. High suspicion for NSAIDs as culprit for possible NSAID colitis or enteritis vs ischemia.     COLO:  - GIF H180 scope advanced to appearances of cecum, can not confirm as solid impacted stool can not be cleared.   - Normal appearing mucosa in left colon mostly (although stool covering mucosa, once cleared with  irrigation, appeared healthy underneath) except splenic flexture area (35 cm-40 cm proximal to anal verge) where there is erythema, also diverticula observed in this area. S/P biopsies.   - Slight rectal erythema, s/p biopsies   - Right colon mucosa appears healthy (see above for extent of exam), s/p biopsies     EGD:   - Normal esophagus except GE  junction that had erythema and slight nodularity s/p biopsies   (likely from N/V)   - Slight gastric erythema but no ulcers, no old or fresh blood seen. Biopsies taken   - Normal appearing duodenum, s/p biopsies     Recommendations:   -- advance to clear diet   -- continue abx  -- w/u fever with bcx, ucx, cxr etc, as unclear if this is GI source  -- please obtain MR enterography  -- will plan for capsule endoscopy and/or colonoscopy on Tuesday, but will see her over the weekend and let you know if she should be prepped for this (willd depend on severity of her sx).  Pt d/w dr Constance Holster    ATTENDING PROGRESS NOTE ATTESTATION    Subjective    I have reviewed the history.  Interval history:  Katherine Church is a 45 year old female with hx of DM, HTN and episode of ischemic colitis in 2011, presenting with RLQ abdominal pain, nausea and what may be melena vs hematochezia. Patient did have ischemic colitis in 2011 and is having similar sx now, but no CT evidence of disease and pain is in the RLQ at this time. High suspicion for NSAIDs as culprit for possible NSAID colitis or enteritis vs ischemia.     Objective    I have examined the patient and concur with the resident/fellow/NP/PA exam.    Assessment and Plan    I agree with the resident/fellow/NP/PA care plan.    See the resident / fellow note for further details.

## 2011-04-11 NOTE — Plan of Care (Signed)
Problem: Nausea/Vomiting  Goal: Absence of nausea  Outcome: Not Met  Pt has occasional  Nausea nad had given phenergan   ivpb with temporary relief,  Will continue to monitor.

## 2011-04-11 NOTE — Plan of Care (Signed)
Problem: Discharge Planning  Goal: Participation in care planning  Outcome: Met  Pt made aware of plan of care at the beginning of shift and updated  , verbalized understanding noted and been cooperative with care . ,will continue to update .

## 2011-04-12 HISTORY — PX: COLONOSCOPY: PRO007

## 2011-04-12 LAB — CBC WITH DIFF, BLOOD
Abs Eosinophils: 0.3 10*3/uL (ref 0.0–0.5)
Abs Lymphs: 1.7 10*3/uL (ref 0.8–3.1)
Abs Monos: 0.6 10*3/uL (ref 0.2–0.8)
Absolute Neutrophil Count: 7.7 10*3/uL — ABNORMAL HIGH (ref 1.6–7.0)
Eosinophils: 3 % (ref 1–7)
Hct: 36.5 % (ref 34.0–45.0)
Hgb: 12.1 gm/dL (ref 11.2–15.7)
Lymphocytes: 17 % — ABNORMAL LOW (ref 19–53)
MCH: 28 pg (ref 26.0–32.0)
MCHC: 33.2 % (ref 32.0–36.0)
MCV: 84.5 um3 (ref 79.0–95.0)
MPV: 10.1 fL (ref 9.4–12.4)
Monocytes: 6 % (ref 5–12)
Plt Count: 204 10*3/uL (ref 140–370)
RBC: 4.32 10*6/uL (ref 3.90–5.20)
RDW: 13.5 % (ref 12.0–14.0)
Segs: 74 % — ABNORMAL HIGH (ref 34–71)
WBC: 10.4 10*3/uL — ABNORMAL HIGH (ref 4.0–10.0)

## 2011-04-12 LAB — C-REACTIVE PROTEIN, BLOOD: CRP: 6.1 mg/dL — ABNORMAL HIGH (ref <0.5)

## 2011-04-12 LAB — GLUCOSE POCT, BLOOD
Meter Glucose (POC): 187 mg/dL — ABNORMAL HIGH (ref 70–115)
Meter Glucose (POC): 160 mg/dL — ABNORMAL HIGH (ref 70–115)
Meter Glucose (POC): 145 mg/dL — ABNORMAL HIGH (ref 70–115)
Meter Glucose (POC): 138 mg/dL — ABNORMAL HIGH (ref 70–115)
Meter Glucose (POC): 119 mg/dL — ABNORMAL HIGH (ref 70–115)

## 2011-04-12 LAB — BASIC METABOLIC PANEL, BLOOD
BUN: 10 mg/dL (ref 6–20)
Bicarbonate: 24 mmol/L (ref 22–29)
Calcium: 9.1 mg/dL (ref 8.6–10.5)
Chloride: 101 mmol/L (ref 98–107)
Creatinine: 0.48 mg/dL — ABNORMAL LOW (ref 0.51–0.95)
Glucose: 166 mg/dL — ABNORMAL HIGH (ref 70–115)
Potassium: 3.6 mmol/L (ref 3.5–5.1)
Sodium: 138 mmol/L (ref 136–145)

## 2011-04-12 LAB — URINE CULTURE

## 2011-04-12 LAB — GFR: GFR: >60 mL/min

## 2011-04-12 MED ORDER — DIPHENHYDRAMINE HCL 25 MG OR TABS OR CAPS CUSTOM
12.5000 mg | ORAL_CAPSULE | Freq: Once | ORAL | Status: DC
Start: 2011-04-12 — End: 2011-04-12

## 2011-04-12 MED ORDER — HYDROXYZINE HCL 25 MG OR TABS
25.00 mg | ORAL_TABLET | Freq: Once | ORAL | Status: AC
Start: 2011-04-12 — End: 2011-04-12
  Filled 2011-04-12: qty 1

## 2011-04-12 MED ORDER — DOCUSATE SODIUM 250 MG OR CAPS
250.0000 mg | ORAL_CAPSULE | Freq: Every day | ORAL | Status: DC
Start: 2011-04-12 — End: 2011-04-19
  Administered 2011-04-14: 250 mg via ORAL
  Filled 2011-04-12 (×7): qty 1

## 2011-04-12 MED ORDER — DIPHENHYDRAMINE HCL 25 MG OR TABS OR CAPS CUSTOM
25.0000 mg | ORAL_CAPSULE | Freq: Four times a day (QID) | ORAL | Status: DC | PRN
Start: 2011-04-12 — End: 2011-04-19
  Administered 2011-04-14: 25 mg via ORAL
  Filled 2011-04-12 (×7): qty 1

## 2011-04-12 MED ORDER — DIPHENHYDRAMINE HCL 50 MG/ML IJ SOLN
12.50 mg | Freq: Once | INTRAMUSCULAR | Status: AC
Start: 2011-04-12 — End: 2011-04-12
  Administered 2011-04-12: 12.5 mg via INTRAVENOUS
  Filled 2011-04-12: qty 50

## 2011-04-12 MED ORDER — GLUCAGON HCL (RDNA) 1 MG IJ SOLR
1.0000 mg | INTRAMUSCULAR | Status: DC
Start: 2011-04-13 — End: 2011-04-12

## 2011-04-12 MED ORDER — HYDROCODONE-ACETAMINOPHEN 5-500 MG OR TABS
1.0000 | ORAL_TABLET | ORAL | Status: DC | PRN
Start: 2011-04-12 — End: 2011-04-18
  Administered 2011-04-13 (×2): 1 via ORAL

## 2011-04-12 MED ORDER — HYDROMORPHONE HCL 1 MG/ML IJ SOLN
1.0000 mg | INTRAMUSCULAR | Status: DC | PRN
Start: 2011-04-12 — End: 2011-04-14
  Administered 2011-04-12 – 2011-04-14 (×9): 1 mg via INTRAVENOUS
  Filled 2011-04-12 (×10): qty 1

## 2011-04-12 MED ORDER — HYDROXYZINE HCL 50 MG OR TABS
25.00 mg | ORAL_TABLET | Freq: Once | ORAL | Status: AC
Start: 2011-04-12 — End: 2011-04-12
  Filled 2011-04-12: qty 1

## 2011-04-12 MED ORDER — SENNOSIDES 8.8 MG/5ML OR SYRP
10.0000 mL | ORAL_SOLUTION | Freq: Every evening | ORAL | Status: DC
Start: 2011-04-12 — End: 2011-04-19
  Administered 2011-04-14: 17.6 mg via ORAL
  Filled 2011-04-12 (×6): qty 10

## 2011-04-12 MED ORDER — SIMETHICONE 80 MG OR CHEW
80.0000 mg | CHEWABLE_TABLET | Freq: Four times a day (QID) | ORAL | Status: DC | PRN
Start: 2011-04-12 — End: 2011-04-19
  Administered 2011-04-13 – 2011-04-17 (×2): 80 mg via ORAL
  Filled 2011-04-12 (×7): qty 1

## 2011-04-12 MED ORDER — HYDROCODONE-ACETAMINOPHEN 5-500 MG OR TABS
2.0000 | ORAL_TABLET | ORAL | Status: DC | PRN
Start: 2011-04-12 — End: 2011-04-18
  Administered 2011-04-12 – 2011-04-18 (×14): 2 via ORAL
  Filled 2011-04-12 (×17): qty 2

## 2011-04-12 MED ORDER — GLUCAGON HCL (RDNA) 1 MG IJ SOLR
1.0000 mg | INTRAMUSCULAR | Status: DC
Start: 2011-04-13 — End: 2011-04-19

## 2011-04-12 MED ORDER — GLUCAGON HCL (RDNA) 1 MG IJ SOLR
1.0000 mg | INTRAMUSCULAR | Status: DC
Start: 2011-04-12 — End: 2011-04-12

## 2011-04-12 NOTE — Plan of Care (Signed)
Problem: Falls - Risk of  Goal: Absence of falls  Outcome: Met  Patient's bed in lowest position, locked in place call light at hands reach instruct to call for assistance, gets up to bedside commode without any difficulty.

## 2011-04-12 NOTE — Plan of Care (Signed)
Problem: Discharge Planning  Goal: Participation in care planning  Outcome: Met  Patient aware of transfer to 633D, when bed available.

## 2011-04-12 NOTE — Interdisciplinary (Signed)
Page to MD:  [name] [facility]:  Pt with itching red rash to back, arms and legs.  Benadryl given at 1920 without relief, pt requesting additional med.  Thanks Vernona Rieger 6W [phone]

## 2011-04-12 NOTE — Plan of Care (Signed)
Problem: Discharge Planning  Goal: Participation in care planning  Outcome: Met  Will transfer to 633C reported given.

## 2011-04-12 NOTE — Plan of Care (Signed)
Problem: Tissue Perfusion - Cardiopulmonary, Altered  Goal: Hemodynamic stability  Outcome: Met  Patient on tele monitor in sinus denies any chest pain continue to monitor.

## 2011-04-12 NOTE — Plan of Care (Signed)
Problem: Nausea/Vomiting  Goal: Absence of nausea  Outcome: Not Met  Patient c/o nausea medication with phergan IV.

## 2011-04-12 NOTE — Plan of Care (Signed)
Problem: Nausea/Vomiting  Goal: Absence of nausea  Outcome: Not Met  Patient inform of MRI tomorrow & start with contrast at 0700, drinking every .

## 2011-04-12 NOTE — Progress Notes (Signed)
DAILY PROGRESS NOTE      Current Hospital LOS: 4 days     Subjective/Interval History:  Patient seen, discussed, examined with team on rounds today, chart, medications, labs reviewed.      Interval history: No acute distress, mild persistent pain in right lower quadrant, no bowel movements feels like increased bloating.  Would like to transition to bland diet.  Rash yesterday afternoon with increased itching, started around 4 pm improved with benadryl, now fading and no associated shortness of breath, difficulty breathing.     Objective:    Vital Signs:   Temperature:  [98.2 F (36.8 C)-98.9 F (37.2 C)] 98.3 F (36.8 C) (09/01 1200)  Blood pressure (BP): (91-136)/(65-99) 91/65 mmHg (09/01 1200)  Heart Rate:  [70-89] 70  (09/01 1200)  Respirations:  [17-20] 18  (09/01 1200)  Pain Score:  [-] 8 (09/01 1259)  O2 Device:  [-] None (Room air) (09/01 0748)  SpO2:  [98 %-100 %] 100 % (09/01 0748)    Bedside Glucose (mg/dL)  Avg: 161.0   Min: 960   Max: 163   Wt Readings from Last 1 Encounters:   04/08/11 78.5 kg (173 lb 1 oz)     08/31 0600 - 09/01 0559  In: 1370 [P.O.:1070; I.V.:300]  Out: 2750 [Urine:2750]    Physical Exam:  Gen: a&Ox3 no acute respiratory distress  Lungs; CTAb  CV:  RRR w/o M/r/G nl s1 s2  Abd: soft, mild persistent RLQ and suprapubic ttp  Ext: no edema  Skin: fine papular erythematous fading rash over abdomen/torso and extremities +pruritis    Laboratory data:   Lab Results   Component Value Date    NA 138 04/12/2011    K 3.6 04/12/2011    CL 101 04/12/2011    BICARB 24 04/12/2011    BUN 10 04/12/2011    CREAT 0.48* 04/12/2011    GLU 166* 04/12/2011    [place] 9.1 04/12/2011     Lab Results   Component Value Date    WBC 10.4* 04/12/2011    HGB 12.1 04/12/2011    HCT 36.5 04/12/2011    PLT 204 04/12/2011    SEG 74* 04/12/2011    LYMPHS 17* 04/12/2011    MONOS 6 04/12/2011    EOS 3 04/12/2011     Current Facility-Administered Medications   Medication   . diphenhydrAMINE (BENADRYL) injection 12.5 mg   . hydrOXYzine (ATARAX) tablet 25 mg    . HYDROcodone-acetaminophen (VICODIN) 5-500 MG tablet 1 tablet   . HYDROcodone-acetaminophen (VICODIN) 5-500 MG tablet 2 tablet   . HYDROmorphone (DILAUDID) injection 1 mg   . diphenhydrAMINE (BENADRYL) tablet 25 mg   . simethicone (MYLICON) chewable tablet 80 mg   . DISCONTD: diphenhydrAMINE (BENADRYL) tablet 12.5 mg   . ciprofloxacin (CIPRO) tablet 500 mg   . metroNIDAZOLE (FLAGYL) tablet 500 mg   . glucose chewable tablet 16 g   . glucose 40% oral gel 1 Tube   . dextrose 50 % solution 12.5 g   . glucagon (GLUCAGON) injection 1 mg   . insulin lispro (HUMALOG) injection 1-10 Units   . heparin injection 5,000 Units   . carvedilol (COREG) tablet 25 mg   . LORazepam (ATIVAN) tablet 2 mg   . promethazine (PHENERGAN) 6.25 mg in sodium chloride 0.9 % 50 mL IVPB   . calcium carbonate (TUMS) chewable tablet 500 mg   . lisinopril (PRINIVIL, ZESTRIL) tablet 40 mg   . DISCONTD: pantoprazole (PROTONIX) injection 40 mg   .  amLODIPINE (NORVASC) tablet 10 mg   . cloNIDine (CATAPRES) tablet 0.3 mg   . sodium chloride 0.9 % flush 3 mL   . sodium chloride 0.9 % flush 3 mL   . sodium chloride 0.9% infusion   . acetaminophen (TYLENOL) tablet 650 mg   . ondansetron (ZOFRAN) 8 mg in sodium chloride 0.9 % 50 mL IVPB   . hydrALAZINE (APRESOLINE) injection 10 mg   . zolpidem (AMBIEN) tablet 5 mg   . DISCONTD: HYDROmorphone (DILAUDID) injection 0.5 mg   . DISCONTD: HYDROmorphone (DILAUDID) injection 1 mg       Assessment and Plan:    45 yo F with history of ischemic colitis who presents with increased abdominal pain concerning for possible recurrent ischemic, infectious, inflammatory colitis.     1. Abdominal pain: Unclear definitive source, although still concerning for possible ischemic, infectious or inflammatory bowel disease.   - Appreciate GI support - Follow up flex sig/EGD biopsies, possible colonoscopy on Tuesday  -MR enterography  -Await stool studies   -Continue IV fluids PPI IV BID, anti-nausea meds   -Pain control w/ IV  medication as needed.   -ADAT to bland  -Continue cipro/flagyl PO    2. HTN: Continue hypertension meds w/ hold parameters    3. DM: Continue ISS with QAC/HS accuchecks.     4.  Rash: concerning for possible drug rash but no clear inciting drug, no further difficulty post antibiotics this morning.  Will monitor clinically and continue prn benadryl.     Gwynneth Munson, MD  Hospital Medicine Attending  PID [phone]

## 2011-04-12 NOTE — Interdisciplinary (Signed)
2nd Page to MD:    [name]:  Pt with red  rash on back, BUE, BLE, very itchy, no relief from benadryl.  Could you please order additional med?  Thanks Vernona Rieger 6W [phone]

## 2011-04-12 NOTE — Plan of Care (Signed)
Problem: Tissue Perfusion - Cardiopulmonary, Altered  Goal: Hemodynamic stability  Outcome: Met  Vs stable, SR on tele, denied any cp or sob, pedal pulses strong and palpable, no  arrythmias noted on the monitor.    Problem: Falls - Risk of  Goal: Absence of falls  Outcome: Met  No fall or injury, alert and oriented, ambulated in the hallway with a steady gait.    Problem: Pain - Acute  Goal: Communication of presence of pain  Outcome: Not Met  Still with intermittent right upper quadrant pain, dilaudid iv given as directed. Requested to have a long acting pain med., Dr. Adriana Reams notified, no new order made.  Pain level down from 8-9/10 to 3-5/10.    Problem: Nausea/Vomiting  Goal: Absence of nausea  Outcome: Met  No complaints of nausea or vomiting at this time, goal ongoing.    Problem: Infection  Goal: Absence of infection signs and symptoms  Outcome: Resolved Date Met:  04/12/11  Admitted for ischemic colitis, iv abt changed to po., afebrile in shift but still with intermittent abdominal pain, wbc trending down, goal ongoing.

## 2011-04-12 NOTE — Interdisciplinary (Signed)
Page to MD;    [name] [facility]:  FYI- Pt's bp 153/105, HR 89.  Administering scheduled coreg and catapres.  Thanks Vernona Rieger 6W [phone]

## 2011-04-12 NOTE — Interdisciplinary (Signed)
Page to MD:  [name] [facility]:  FYI-Pt's BP 168/110, HR 94.  Pt just received catapres, unable to tolerate earlier d/t nausea.  Thanks Vernona Rieger 6W [phone]

## 2011-04-12 NOTE — Plan of Care (Signed)
Problem: Pain - Acute  Goal: Communication of presence of pain  Outcome: Met  Patient 7/10 head/abdomen now 5/10 given pain medication & tylenol.

## 2011-04-13 LAB — GLUCOSE POCT, BLOOD
Meter Glucose (POC): 167 mg/dL — ABNORMAL HIGH (ref 70–115)
Meter Glucose (POC): 143 mg/dL — ABNORMAL HIGH (ref 70–115)

## 2011-04-13 LAB — CBC WITH DIFF, BLOOD
Abs Eosinophils: 0.3 10*3/uL (ref 0.0–0.5)
Abs Lymphs: 1.8 10*3/uL (ref 0.8–3.1)
Abs Monos: 0.5 10*3/uL (ref 0.2–0.8)
Absolute Neutrophil Count: 6.8 10*3/uL (ref 1.6–7.0)
Eosinophils: 3 % (ref 1–7)
Hct: 41 % (ref 34.0–45.0)
Hgb: 14.3 gm/dL (ref 11.2–15.7)
Imm Gran %: 1 % (ref 0–1)
Imm Gran Abs: 0.1 10*3/uL (ref 0.0–0.1)
Lymphocytes: 19 % (ref 19–53)
MCH: 29.2 pg (ref 26.0–32.0)
MCHC: 34.9 % (ref 32.0–36.0)
MCV: 83.7 um3 (ref 79.0–95.0)
MPV: 10.3 fL (ref 9.4–12.4)
Monocytes: 6 % (ref 5–12)
Plt Count: 200 10*3/uL (ref 140–370)
Plt Est: NORMAL
RBC: 4.9 10*6/uL (ref 3.90–5.20)
RDW: 13.4 % (ref 12.0–14.0)
Segs: 71 % (ref 34–71)
WBC: 9.5 10*3/uL (ref 4.0–10.0)

## 2011-04-13 LAB — HEMOLYSIS

## 2011-04-13 LAB — COMPREHENSIVE METABOLIC PANEL, BLOOD
ALT (SGPT): 25 U/L (ref 0–33)
AST (SGOT): 28 U/L (ref 0–32)
Albumin: 4.2 g/dL (ref 3.5–5.2)
Alkaline Phos: 64 U/L (ref 35–140)
BUN: 11 mg/dL (ref 6–20)
Bicarbonate: 25 mmol/L (ref 22–29)
Bilirubin, Tot: 0.2 mg/dL (ref <1.2)
Calcium: 9.1 mg/dL (ref 8.6–10.5)
Chloride: 99 mmol/L (ref 98–107)
Creatinine: 0.47 mg/dL — ABNORMAL LOW (ref 0.51–0.95)
Glucose: 239 mg/dL — ABNORMAL HIGH (ref 70–115)
Potassium: 3.8 mmol/L (ref 3.5–5.1)
Sodium: 137 mmol/L (ref 136–145)
Total Protein: 7.4 g/dL (ref 6.0–8.0)

## 2011-04-13 LAB — GFR: GFR: >60 mL/min

## 2011-04-13 MED ORDER — NYSTATIN 100000 UNIT VA TABS
1.0000 | ORAL_TABLET | Freq: Every evening | VAGINAL | Status: DC
Start: 2011-04-13 — End: 2011-04-17
  Filled 2011-04-13 (×2): qty 1

## 2011-04-13 MED ORDER — HYDROXYZINE HCL 50 MG OR TABS
25.0000 mg | ORAL_TABLET | Freq: Four times a day (QID) | ORAL | Status: DC | PRN
Start: 2011-04-13 — End: 2011-04-13

## 2011-04-13 MED ORDER — CETIRIZINE HCL 10 MG OR TABS
10.0000 mg | ORAL_TABLET | Freq: Every day | ORAL | Status: DC
Start: 2011-04-13 — End: 2011-04-19
  Administered 2011-04-13: 10 mg via ORAL
  Filled 2011-04-13 (×8): qty 1

## 2011-04-13 MED ORDER — SODIUM CHLORIDE 0.9 % IV SOLN
3000.0000 mg | Freq: Four times a day (QID) | INTRAVENOUS | Status: DC
Start: 2011-04-13 — End: 2011-04-17
  Administered 2011-04-14 – 2011-04-16 (×2): 3000 mg via INTRAVENOUS
  Filled 2011-04-13 (×17): qty 3000

## 2011-04-13 MED ORDER — DOCUSATE SODIUM 250 MG OR CAPS
250.0000 mg | ORAL_CAPSULE | Freq: Two times a day (BID) | ORAL | Status: DC | PRN
Start: 2011-04-13 — End: 2011-04-19
  Filled 2011-04-13 (×3): qty 1

## 2011-04-13 NOTE — Interdisciplinary (Signed)
Text paged MD as follows:  Pt in [facility] [name] tolerates the clear liquid diet abd would like to try mechanical soft diet. Pt c/o headache and said that is because she has not really eaten and would like yogurt. Also, pt said she think is developing a yeast infection. Pls advise. Thx

## 2011-04-13 NOTE — Progress Notes (Signed)
DAILY PROGRESS NOTE      Current Hospital LOS: 5 days     Subjective/Interval History:  Patient seen, discussed, examined with team on rounds today, chart, medications, labs reviewed.  Please see resident note for details.      Interval history: Persistent abdominal pain, MRI this morning would like to advance diet again. Still no BM. Rash is worse today with increased itch.      Objective:    Vital Signs:   Temperature:  [98.1 F (36.7 C)-99 F (37.2 C)] 98.9 F (37.2 C) (09/02 1433)  Blood pressure (BP): (112-168)/(83-110) 112/83 mmHg (09/02 1744)  Heart Rate:  [78-100] 82  (09/02 1744)  Respirations:  [18] 18  (09/02 1433)  Pain Score:  [-] 7 (09/02 1703)  O2 Device:  [-] None (Room air) (09/02 1433)  SpO2:  [97 %-98 %] 98 % (09/02 1433)    Bedside Glucose (mg/dL)  Avg: 161.0   Min: 960   Max: 170   Wt Readings from Last 1 Encounters:   04/08/11 78.5 kg (173 lb 1 oz)     09/01 0600 - 09/02 0559  In: 1010 [P.O.:960; I.V.:50]  Out: 1300 [Urine:1300]    Physical Exam:  Gen: a&Ox3 no acute respiratory distress  Lungs; CTAb  CV:  RRR w/o M/r/G nl s1 s2  Abd: soft, mild persistent RLQ and suprapubic ttp  Ext: no edema  Skin: fine papular erythematous fading rash over abdomen/torso and extremities +pruritis    Laboratory data:   Lab Results   Component Value Date    NA 137 04/13/2011    K 3.8 04/13/2011    CL 99 04/13/2011    BICARB 25 04/13/2011    BUN 11 04/13/2011    CREAT 0.47* 04/13/2011    GLU 239* 04/13/2011    [place] 9.1 04/13/2011     Lab Results   Component Value Date    WBC 9.5 04/13/2011    HGB 14.3 04/13/2011    HCT 41.0 04/13/2011    PLT 200 04/13/2011    SEG 71 04/13/2011    LYMPHS 19 04/13/2011    MONOS 6 04/13/2011    EOS 3 04/13/2011     Current Facility-Administered Medications   Medication   . cetirizine (ZYRTEC) tablet 10 mg   . ampicillin-sulbactam (UNASYN) 3,000 mg in sodium chloride 0.9 % 100 mL IVPB   . nystatin vaginal tablet 1 tablet   . DISCONTD: hydrOXYzine (ATARAX) tablet 25 mg   . HYDROcodone-acetaminophen (VICODIN) 5-500  MG tablet 1 tablet   . HYDROcodone-acetaminophen (VICODIN) 5-500 MG tablet 2 tablet   . HYDROmorphone (DILAUDID) injection 1 mg   . diphenhydrAMINE (BENADRYL) tablet 25 mg   . simethicone (MYLICON) chewable tablet 80 mg   . docusate sodium (COLACE) capsule 250 mg   . sennosides (SENOKOT) syrup 17.6 mg   . glucagon (GLUCAGON) injection 1 mg   . hydrOXYzine (ATARAX) tablet 25 mg   . DISCONTD: glucagon (GLUCAGON) injection 1 mg   . glucose chewable tablet 16 g   . glucose 40% oral gel 1 Tube   . dextrose 50 % solution 12.5 g   . glucagon (GLUCAGON) injection 1 mg   . insulin lispro (HUMALOG) injection 1-10 Units   . heparin injection 5,000 Units   . DISCONTD: ciprofloxacin (CIPRO) tablet 500 mg   . DISCONTD: metroNIDAZOLE (FLAGYL) tablet 500 mg   . carvedilol (COREG) tablet 25 mg   . LORazepam (ATIVAN) tablet 2 mg   . promethazine (PHENERGAN)  6.25 mg in sodium chloride 0.9 % 50 mL IVPB   . calcium carbonate (TUMS) chewable tablet 500 mg   . lisinopril (PRINIVIL, ZESTRIL) tablet 40 mg   . amLODIPINE (NORVASC) tablet 10 mg   . cloNIDine (CATAPRES) tablet 0.3 mg   . sodium chloride 0.9 % flush 3 mL   . sodium chloride 0.9 % flush 3 mL   . sodium chloride 0.9% infusion   . acetaminophen (TYLENOL) tablet 650 mg   . ondansetron (ZOFRAN) 8 mg in sodium chloride 0.9 % 50 mL IVPB   . hydrALAZINE (APRESOLINE) injection 10 mg   . zolpidem (AMBIEN) tablet 5 mg       Assessment and Plan:    45 yo F with history of ischemic colitis who presents with increased abdominal pain concerning for possible recurrent ischemic, infectious, inflammatory colitis.     1. Abdominal pain: Unclear definitive source, although still concerning for possible ischemic, infectious or inflammatory bowel disease.   - Appreciate GI support - Follow up flex sig/EGD biopsies, possible colonoscopy on Tuesday  -MR enterography  -Await stool studies   -Continue IV fluids PPI IV BID, anti-nausea meds   -Pain control w/ IV medication as needed.   -ADAT to  bland  -Change to unasyn for possible allergic reaction    2. HTN: Continue hypertension meds w/ hold parameters    3. DM: Continue ISS with QAC/HS accuchecks.     4.  Rash: given recurrence most concerning would be antibiotics.  Will dc as above, continue benadryl, atarax and start zyrtec for itch.     Gwynneth Munson, MD  Hospital Medicine Attending  PID [phone]

## 2011-04-13 NOTE — Plan of Care (Signed)
Problem: Falls - Risk of  Goal: Absence of falls  Outcome: Met  I:Safety precautions done, bed at lowest position, side rails up x 2,hourly rounds, call light within reach and instructed to call for assistance if needed.  O: No injuries noted. Will continue to monitor.    Problem: Discharge Planning  Goal: Participation in care planning  Outcome: Met  I/O:No D/C needs at this time.Will continue to monitor.    Problem: Pain - Acute  Goal: Communication of presence of pain  Outcome: Met  I/O:Pt continues to c/o abdominal pain 8/10. Comfort measures done. Medication given q 2 hr ( dilaudid IV). Pain goes down to 3-4/10 after medication.Pt resting in bed.Will continues to monitor.    Problem: Nausea/Vomiting  Goal: Absence of nausea  Outcome: Met  I/O:Pt continues to c/o continuous nausea. Pt given phenergan IV q 6 hrs.Pt has good relieve after phenergan and refuses to get zofran because "she says it does not work for her only the phenergan works".MD aware. Pt was changed to a carb limited clear liq diet. Pt tolerating fluids at this time.Will continue to monitor.    Problem: Alteration in Blood Glucose  Goal: Glucose level within specified parameters  Outcome: Met  I/O:Pt continues on FS ac/hs  8:126 and  12:170. Pt aware of FS results. Pt was stated on DM education.Pt continues on insulin per sliding scale.Pt informed of insulin given. No adverse reactions noted. Will continue to monitor.    Problem: Skin Integrity Impaired: Secondary to pressure ulcer(admission or hospital acquired), cellulitis, incontinence, surgical wound/incision, chronic wound or skin disease  Goal: Absence of infection signs and symptoms  Outcome: Met  I/O:Skin continues intact with exception of general mild rash and itching . Dr Whitney Muse aware.

## 2011-04-13 NOTE — Interdisciplinary (Signed)
Report given to Marisue Ivan, Charity fundraiser.  Endorsed need to follow up with MRI contrast instructions.  Attempted to call MRI x3 however no answer at this time.

## 2011-04-13 NOTE — Plan of Care (Signed)
Problem: Alteration in Blood Glucose  Goal: Glucose level within specified parameters  Outcome: Met  Pt's 2100 fingerstick blood sugar 119, no SSI required, pt without s/s of hypo/hyperglycemia.

## 2011-04-13 NOTE — Plan of Care (Signed)
Problem: Skin Integrity Impaired: Secondary to pressure ulcer(admission or hospital acquired), cellulitis, incontinence, surgical wound/incision, chronic wound or skin disease  Goal: Absence of infection signs and symptoms  Outcome: Met  Since arrival to unit pt with rash of small red bumps throughout back, chest, BUE/BLE, pt c/o severe itching and warmth to skin.  Pt medicated with benadryl x2 this shift and atarax x1, MD made aware of persistence of rash.  Pt also given ice packs for relief.

## 2011-04-13 NOTE — Plan of Care (Signed)
Problem: Tissue Perfusion - Cardiopulmonary, Altered  Goal: Hemodynamic stability  Outcome: Met  I/O:Pt A/Ox 3, BP was high during night shift MD aware per night nurse Vernona Rieger, this am BP 140/100 HR 100.Pt was NPO during the night for MRI and left at 8am then after pt arrived from MRI at 10:30hrs Pt c/o having  Nausea,palpitations and some chest pressure during procedure.BP was  Taken uppon arrival and was 131/94 HR 87 on left arm and 123/83 HR 91 on right arm, she denies any chest pain /pressure/palpaitations at this time, Dr Whitney Muse and the medical team at the bedside were informed.Dr Whitney Muse Also informed pt started her menses, pt continues nauteated and that she continues c/o general rash and itching. Per Dr Dr Whitney Muse  Will start more itching medication and will stop antibiotics.Will continue to monitor.

## 2011-04-13 NOTE — Plan of Care (Signed)
Problem: Tissue Perfusion - Cardiopulmonary, Altered  Goal: Hemodynamic stability  Outcome: Met  Pt remains hemodynamically stable, pt has been hypertensive this shift 150s-160s/90s-110s, MD informed and pt medicated with scheduled coreg and catapres, pt asymptomatic and denies CP/SOB.     Problem: Falls - Risk of  Goal: Absence of falls  Outcome: Met  Fall precautions in place as per POC.  Pt moderate risk for falls as per fall risk assessment, pt verbalized understanding of fall risk and compliant with calling for assistance as needed.  Pt's bed in low/locked position, 2/4 side rails elevated, call light within reach, nonskid socks on, pt's needs assessed often and hourly rounding done for pt safety.  Pt without injury/fall.         Problem: Discharge Planning  Goal: Participation in care planning  Outcome: Met  Pt's discharge in process, pt receiving nausea and pain medication, pt now NPO for MRI/enterography with contrast today.      Problem: Pain - Acute  Goal: Control of acute pain  Outcome: Met  Pain level assessed using numerical pain scale.  Pt reports 7/10 pain to lower abd described as constant and dull.  Patient medicated with hydromorphone, see eMAR.  Upon reassessment pt reported pain 5/10.  Patient repositioned for comfort.  Will continue to monitor and assess pt's pain q2h.         Problem: Nausea/Vomiting  Goal: Absence of nausea  Outcome: Met  Pt assessed for presence of nausea/vomiting.  Pt without emesis this shift however did report nausea, medicated with IV phenergan with good relief, pt refused zofran d/t ineffectiveness.  Pt maintained on previous ordered diet and was able to tolerate oral fluid intake, now NPO.  Will cont to monitor.

## 2011-04-13 NOTE — Interdisciplinary (Signed)
Pt arrived from 10E via wheelchair, pt ambulated to bed.  Pt in NAD, rash to back/BUE/BLE, pt hypertensive (MD notified) and c/o nausea and pain.  Medicated as ordered for pain, ordered phenergan from pharmacy.  Pt oriented to room and surroundings. Will cont with POC.

## 2011-04-13 NOTE — Interdisciplinary (Signed)
MIRIAM T Lyndon / [phone]   FYI pt [name] is requesting for a laxative/ stool softener as she feels she needs to have # 2( BM), she's getting daily Colace thou'. Pls address, thanks. Fayrene Fearing RN

## 2011-04-13 NOTE — Progress Notes (Signed)
Medicine Intern Progress Note: 04/11/2011     Current Hospital Stay:   5 days - Admitted on: 04/08/2011    Overnight events: rash on back received hydroxasin        Subjective: rash progressing and itch, had palpations last night. Complains of increased abdominal bloating, persitent LLQ and suprapubic pain.       Medications:        . docusate sodium  250 mg Daily   . sennosides  10 mL HS   . glucagon  1 mg On Call   . hydrOXYzine  25 mg Once   . DISCONTD: glucagon  1 mg On Call   . DISCONTD: glucagon  1 mg On Call   . ciprofloxacin  500 mg Q12H   . metroNIDAZOLE  500 mg Q8H   . insulin lispro  1-10 Units 4x Daily AC & HS   . heparin  5,000 Units Q8H   . carvedilol  25 mg BID   . lisinopril  40 mg BID   . DISCONTD: pantoprazole  40 mg Q12H   . amLODIPINE  10 mg Daily   . clonidine  0.3 mg TID   . sodium chloride  3 mL Q8H     IV Meds:        . sodium chloride       PRN Meds:        . HYDROcodone-acetaminophen  1 tablet Q4H PRN   . HYDROcodone-acetaminophen  2 tablet Q4H PRN   . HYDROmorphone  1 mg Q2H PRN   . diphenhydrAMINE  25 mg Q6H PRN   . simethicone  80 mg Q6H PRN   . glucose  4 tablet PRN   . glucose 40%  1 Tube PRN   . dextrose  12.5 g PRN   . glucagon  1 mg Once PRN   . LORazepam  2 mg Q6H PRN   . promethazine (PHENERGAN) IVPB  6.25 mg Q6H PRN   . calcium carbonate  500 mg Q3H PRN   . sodium chloride  3 mL PRN   . sodium chloride   Continuous PRN   . acetaminophen  650 mg Q4H PRN   . ondansetron (ZOFRAN) IVPB  8 mg Q8H PRN   . hydrALAZINE  10 mg Q6H PRN   . zolpidem  5 mg Nightly PRN   . DISCONTD: HYDROmorphone  0.5 mg Q2H PRN   . DISCONTD: HYDROmorphone  1 mg Q2H PRN       Objective:  Vital Signs:  Latest Entry Range (last 24 hours)  Temperature: 99 F (37.2 C) Temp  Avg: 98.3 F (36.8 C)  Min: 97.1 F (36.2 C)  Max: 99 F (37.2 C)  Blood pressure (BP): 130/91 mmHg BP  Min: 91/65  Max: 168/110  Heart Rate: 88  Pulse  Avg: 85.8   Min: 70   Max: 94   Respirations: 18  Resp  Avg: 18.5   Min: 18   Max: 20    SpO2: 97 % SpO2  Avg: 98.5 %  Min: 97 %  Max: 100 %    Physical Exam:  Gen: Well appearing, WNWD, NAD  Neuro: A+O x3, nonfocal  HEENT: OP clear, mucosa moist, PERRL  Lung: CTA bila, no c/w/r  CV: RRR, no m/r/g  Abd: +BS decreased, obese abdomen, soft, No masses, no HSM, tender Palpation LLQ abdomen  Ext: no edema, clubbing, or cyanosis  Skin: ourouric rash back, arms, legs      Intake/Output (  24hrs):  09/01 0600 - 09/02 0559  In: 1010 [P.O.:960; I.V.:50]  Out: 1300 [Urine:1300]          Laboratory data:   Recent Labs   [facility] 04/12/11 0702 04/11/11 1707 04/10/11 0742    NA 138 137 140    K 3.6 3.5 3.5    CL 101 101 103    BICARB 24 26 23     BUN 10 8 6     CREAT 0.48* 0.46* 0.45*    GLU 166* 136* 115    [place] 9.1 9.2 8.6    MG -- -- --    PHOS -- -- --    AST -- -- --    ALT -- -- --    ALK -- -- --    TBILI -- -- --    PTT -- -- --    INR -- -- --     Recent Labs   [facility] 04/12/11 0702 04/11/11 1707 04/10/11 0742    WBC 10.4* 9.7 9.5    HGB 12.1 12.1 12.4    HCT 36.5 35.4 36.6    PLT 204 203 188    BAND -- -- --    SEG 74* -- 76*    LYMPHS 17* -- 17*    MONO -- -- --       Micro:  Lab Results   Component Value Date    BLOODCULT  Value: No Growth after 24 hour/s of incubation. No Growth after 48 hour/s of incubation. 04/10/2011    BLOODCULT  Value: No Growth after 24 hour/s of incubation. No Growth after 48 hour/s of incubation. 04/10/2011    BLOODCULT No Growth after 5 day/s of incubation. 09/04/2009    BLOODCULT No Growth after 5 day/s of incubation. 09/04/2009     Stool studies   c diff neg  Stool cutlures pending  Stool WBC positive    Colonoscopy   Endoscopic Diagnosis: - Enema prep.   - GIF H180 scope advanced to appearances of cecum,   can not confirm as solid impacted stool can not be cleared.   - Normal appearing mucosa in left colon mostly (although   stool covering mucosa, once cleared with irrigation,   appeared healthy underneath) except splenic flexture area   (35 cm-40 cm proximal to anal verge) where  there is   erythema, also diverticula observed in this area. S/P   biopsies.   - Slight rectal erythema, s/p biopsies   - Right colon mucosa appears healthy (see above for extent   of exam), s/p biopsies   - Retroflexion not done as can not hold in air   Recommendations: Follow biopsy results, other   recommendations per in-patient GI consultation note.   Should get complete colonoscopy given small polyp   appearances in left colon, unsatisfactory prep for   colonsocopy today and RLQ pain, this can only be done   once patient can tolerate preparation (in or out patient okay)    EGD  Endoscopic Diagnosis: - Normal esophagus except GE   junction that had erythema and slight nodularity s/p biopsies   (likely from N/V)   - Slight gastric erythema but no ulcers, no old or fresh blood   seen. Biopsies taken   - Normal appearing duodenum, s/p biopsies   Recommendations: Follow up biopsy results. Other   recommendations per GI inpatient consultation note and   see colonoscopy report.    Path results pending    Assessment and Care Plan:  Katherine Church is a 45 year old woman with a Hx of DM, HTN and ischemic colitis, now presenting with 5 days of constant RLQ pain and pressure associated nausea, thick dark BM's, absent bowel sounds on physical examination and an elevated WBC concerning for ischemic colitis vs. Mesenteric ischemia vs. Bowel obstriction IBD of cecal area.     #Abdominal pain- concerning for Ischemic bowel Event, obstruction or inflammation. WBC trending down, still has nausea and RLQ pain.    - I.V fluids   -appreciate G.I recs  MR enterography  -capsule endoscop/ and or colonoscopy  -awaiting fnal path results   -cont. cipro, flagyl   -advance diet   -Switch to PO meds, PPI  - awaiting results for Stool studies- guaiac, c. Diff toxin,   culture, Ovum parasite.        #HTN-Continue Home meds   -Clonidine 0.3mg  T.I.D   Amlodipine 10mg    -carvedilol 25mg  B.I.D   -lisinopril 40mg  B.I.D  - PRN  hydralazine    #Diabetis  SSI q6 checks   -d/c glipizide     FEN-  Bland diet     #PPx- heparin    Code Status:   Full Code      Malachy Mood, MD  Internal Medicine PGY-1  Pgr [phone]

## 2011-04-14 ENCOUNTER — Other Ambulatory Visit: Payer: Self-pay

## 2011-04-14 LAB — URINALYSIS
Bilirubin: NEGATIVE
Glucose: NEGATIVE
Ketones: NEGATIVE
Leuk Esterase: NEGATIVE
Nitrite: NEGATIVE
Protein: NEGATIVE
Specific Gravity: 1.016 (ref 1.002–1.030)
pH: 7 (ref 5.0–8.0)

## 2011-04-14 LAB — CBC WITH DIFF, BLOOD
Abs Eosinophils: 0.4 10*3/uL (ref 0.0–0.5)
Abs Lymphs: 2.3 10*3/uL (ref 0.8–3.1)
Abs Monos: 0.5 10*3/uL (ref 0.2–0.8)
Absolute Neutrophil Count: 5.1 10*3/uL (ref 1.6–7.0)
Eosinophils: 5 % (ref 1–7)
Hct: 36.8 % (ref 34.0–45.0)
Hgb: 12.8 gm/dL (ref 11.2–15.7)
Lymphocytes: 27 % (ref 19–53)
MCH: 29.4 pg (ref 26.0–32.0)
MCHC: 34.8 % (ref 32.0–36.0)
MCV: 84.4 um3 (ref 79.0–95.0)
MPV: 10.4 fL (ref 9.4–12.4)
Monocytes: 6 % (ref 5–12)
Plt Count: 191 10*3/uL (ref 140–370)
RBC: 4.36 10*6/uL (ref 3.90–5.20)
RDW: 13.6 % (ref 12.0–14.0)
Segs: 62 % (ref 34–71)
WBC: 8.3 10*3/uL (ref 4.0–10.0)

## 2011-04-14 LAB — GLUCOSE POCT, BLOOD
Meter Glucose (POC): 163 mg/dL — ABNORMAL HIGH (ref 70–115)
Meter Glucose (POC): 156 mg/dL — ABNORMAL HIGH (ref 70–115)
Meter Glucose (POC): 128 mg/dL — ABNORMAL HIGH (ref 70–115)
Meter Glucose (POC): 170 mg/dL — ABNORMAL HIGH (ref 70–115)
Meter Glucose (POC): 101 mg/dL (ref 70–115)
Meter Glucose (POC): 165 mg/dL — ABNORMAL HIGH (ref 70–115)
Meter Glucose (POC): 132 mg/dL — ABNORMAL HIGH (ref 70–115)
Meter Glucose (POC): 170 mg/dL — ABNORMAL HIGH (ref 70–115)
Meter Glucose (POC): 147 mg/dL — ABNORMAL HIGH (ref 70–115)
Meter Glucose (POC): 146 mg/dL — ABNORMAL HIGH (ref 70–115)

## 2011-04-14 LAB — COMPREHENSIVE METABOLIC PANEL, BLOOD
ALT (SGPT): 25 U/L (ref 0–33)
AST (SGOT): 29 U/L (ref 0–32)
Albumin: 3.9 g/dL (ref 3.5–5.2)
Alkaline Phos: 65 U/L (ref 35–140)
BUN: 12 mg/dL (ref 6–20)
Bicarbonate: 23 mmol/L (ref 22–29)
Bilirubin, Tot: 0.2 mg/dL (ref <1.2)
Calcium: 8.9 mg/dL (ref 8.6–10.5)
Chloride: 99 mmol/L (ref 98–107)
Creatinine: 0.45 mg/dL — ABNORMAL LOW (ref 0.51–0.95)
Glucose: 144 mg/dL — ABNORMAL HIGH (ref 70–115)
Potassium: 3.8 mmol/L (ref 3.5–5.1)
Sodium: 135 mmol/L — ABNORMAL LOW (ref 136–145)
Total Protein: 7 g/dL (ref 6.0–8.0)

## 2011-04-14 LAB — GFR: GFR: >60 mL/min

## 2011-04-14 MED ORDER — FLUCONAZOLE 100 MG OR TABS
200.00 mg | ORAL_TABLET | Freq: Once | ORAL | Status: AC
Start: 2011-04-14 — End: 2011-04-14
  Filled 2011-04-14: qty 2

## 2011-04-14 MED ORDER — MINERAL OIL RE ENEM
1.00 | ENEMA | Freq: Once | RECTAL | Status: AC
Start: 2011-04-14 — End: 2011-04-14
  Filled 2011-04-14: qty 133

## 2011-04-14 MED ORDER — BISACODYL 10 MG RE SUPP
10.0000 mg | Freq: Every day | RECTAL | Status: DC | PRN
Start: 2011-04-14 — End: 2011-04-19
  Administered 2011-04-18: 10 mg via RECTAL
  Filled 2011-04-14 (×2): qty 1

## 2011-04-14 MED ORDER — PEG 3350-KCL-NABCB-NACL-NASULF 236 GM OR SOLR
4000.0000 mL | Freq: Once | ORAL | Status: DC
Start: 2011-04-14 — End: 2011-04-14
  Filled 2011-04-14: qty 4000

## 2011-04-14 MED ORDER — PROMETHAZINE HCL 25 MG OR TABS
12.5000 mg | ORAL_TABLET | Freq: Four times a day (QID) | ORAL | Status: DC | PRN
Start: 2011-04-14 — End: 2011-04-19
  Administered 2011-04-15: 12.5 mg via ORAL
  Filled 2011-04-14 (×4): qty 1

## 2011-04-14 MED ORDER — POLYETHYLENE GLYCOL 3350 OR PACK
17.0000 g | PACK | Freq: Every day | ORAL | Status: DC
Start: 2011-04-14 — End: 2011-04-19
  Administered 2011-04-15: 17 g via ORAL
  Filled 2011-04-14 (×5): qty 1

## 2011-04-14 MED ORDER — HYDROMORPHONE HCL 1 MG/ML IJ SOLN
1.0000 mg | INTRAMUSCULAR | Status: DC | PRN
Start: 2011-04-14 — End: 2011-04-17
  Administered 2011-04-14 – 2011-04-17 (×18): 1 mg via INTRAVENOUS
  Filled 2011-04-14 (×17): qty 1

## 2011-04-14 NOTE — Plan of Care (Signed)
Hand off report given to Jennifer, RN

## 2011-04-14 NOTE — Plan of Care (Signed)
Text paged Dr. Christella Hartigan notifying that she has no result from Dulcolax supp and mineral oil enema.Will she be NPO after MN tonight? Thanks.

## 2011-04-14 NOTE — Plan of Care (Signed)
Problem: Pain - Acute  Goal: Communication of presence of pain  Outcome: Met  Calls for pain meds appropriately.  Goal: Control of acute pain  Outcome: Met  C/O of abdominal pain 7/10 at the beginning of the shift.Vicodin 2 tabs po given with relief.Pain scale down to 4/10 which is acceptable for her.    Problem: Nausea/Vomiting  Goal: Absence of nausea  Outcome: Not Met  C/O of nausea,medicated with Phenergan 6.25mg  iv with 50ml NS with relief.Able to eat late breakfast after.Continue to monitor.

## 2011-04-14 NOTE — Progress Notes (Addendum)
Medicine Intern Progress Note: 04/11/2011     Current Hospital Stay:   6 days - Admitted on: 04/08/2011    Overnight events: no over night events        Subjective: rash decreasing less itchy, constipated, bloating mildy resolved still in RLQ and suprapubic area. Has mild burning on urination and foul smelling urine..     Medications:        . cetirizine  10 mg Daily   . ampicillin-sulbactam (UNASYN) IVPB  3,000 mg Q6H   . nystatin  1 tablet HS   . docusate sodium  250 mg Daily   . sennosides  10 mL HS   . glucagon  1 mg On Call   . insulin lispro  1-10 Units 4x Daily AC & HS   . heparin  5,000 Units Q8H   . DISCONTD: ciprofloxacin  500 mg Q12H   . DISCONTD: metroNIDAZOLE  500 mg Q8H   . carvedilol  25 mg BID   . lisinopril  40 mg BID   . amLODIPINE  10 mg Daily   . clonidine  0.3 mg TID   . sodium chloride  3 mL Q8H     IV Meds:        . sodium chloride       PRN Meds:        . docusate sodium  250 mg BID PRN   . DISCONTD: hydrOXYzine  25 mg 4x Daily PRN   . HYDROcodone-acetaminophen  1 tablet Q4H PRN   . HYDROcodone-acetaminophen  2 tablet Q4H PRN   . HYDROmorphone  1 mg Q2H PRN   . diphenhydrAMINE  25 mg Q6H PRN   . simethicone  80 mg Q6H PRN   . glucose  4 tablet PRN   . glucose 40%  1 Tube PRN   . dextrose  12.5 g PRN   . glucagon  1 mg Once PRN   . LORazepam  2 mg Q6H PRN   . promethazine (PHENERGAN) IVPB  6.25 mg Q6H PRN   . calcium carbonate  500 mg Q3H PRN   . sodium chloride  3 mL PRN   . sodium chloride   Continuous PRN   . acetaminophen  650 mg Q4H PRN   . ondansetron (ZOFRAN) IVPB  8 mg Q8H PRN   . hydrALAZINE  10 mg Q6H PRN   . zolpidem  5 mg Nightly PRN       Objective:  Vital Signs:  Latest Entry Range (last 24 hours)  Temperature: 98.4 F (36.9 C) Temp  Avg: 98.5 F (36.9 C)  Min: 98.1 F (36.7 C)  Max: 98.9 F (37.2 C)  Blood pressure (BP): 116/70 mmHg BP  Min: 112/83  Max: 140/100  Heart Rate: 74  Pulse  Avg: 88.6   Min: 74   Max: 100   Respirations: 18  Resp  Avg: 18   Min: 18   Max: 18    SpO2: 98 % SpO2  Avg: 98 %  Min: 98 %  Max: 98 %    Physical Exam:  Gen: Well appearing, WNWD, NAD  Neuro: A+O x3, nonfocal  HEENT: OP clear, mucosa moist, PERRL  Lung: CTA bila, no c/w/r  CV: RRR, no m/r/g  Abd: +BS decreased, obese abdomen, soft, No masses, no HSM, tender Palpation LLQ abdomen  Ext: no edema, clubbing, or cyanosis  Skin: ourouric rash back, arms, legs  NA 138 137Recent Labs   [facility] 04/13/11 1105 04/12/11 0702 04/11/11 1707    NA 137 138 137    K 3.8 3.6 3.5    CL 99 101 101    BICARB 25 24 26     BUN 11 10 8     CREAT 0.47* 0.48* 0.46*    GLU 239* 166* 136*    [place] 9.1 9.1 9.2    MG -- -- --    PHOS -- -- --    AST 28 -- --    ALT 25 -- --    ALK 64 -- --    TBILI 0.2 -- --    PTT -- -- --    INR -- -- --    140    K 3.6 3.5 3.5    CL 101 101 103    BICARB 24 26 23     BUN 10 8 6     CREAT 0.48* 0.46* 0.45*    GLU 166* 136* 115    [place] 9.1 9.2 8.6    MG -- -- --    PHOS -- -- --    AST -- -- --    ALT -- -- --    ALK -- -- --    TBILI -- -- --    PTT -- -- --    INR -- -- --      Basename 04/13/11 0727 04/12/11 0702 04/11/11 1707    WBC 9.5 10.4* 9.7    HGB 14.3 12.1 12.1    HCT 41.0 36.5 35.4    PLT 200 204 203    BAND -- -- --    SEG 71 74* --    LYMPHS 19 17* --    MONO -- -- --       Micro:  Lab Results   Component Value Date    BLOODCULT  Value: No Growth after 24 hour/s of incubation. No Growth after 48 hour/s of incubation. No Growth after 72 hour/s of incubation. 04/10/2011    BLOODCULT  Value: No Growth after 24 hour/s of incubation. No Growth after 48 hour/s of incubation. No Growth after 72 hour/s of incubation. 04/10/2011    BLOODCULT No Growth after 5 day/s of incubation. 09/04/2009    BLOODCULT No Growth after 5 day/s of incubation. 09/04/2009     Stool studies   c diff neg  Stool cutlures pending  Stool WBC positive    Colonoscopy   Endoscopic Diagnosis: - Enema prep.   - GIF H180 scope advanced to appearances of cecum,   can not confirm as solid impacted stool can not be  cleared.   - Normal appearing mucosa in left colon mostly (although   stool covering mucosa, once cleared with irrigation,   appeared healthy underneath) except splenic flexture area   (35 cm-40 cm proximal to anal verge) where there is   erythema, also diverticula observed in this area. S/P   biopsies.   - Slight rectal erythema, s/p biopsies   - Right colon mucosa appears healthy (see above for extent   of exam), s/p biopsies   - Retroflexion not done as can not hold in air   Recommendations: Follow biopsy results, other   recommendations per in-patient GI consultation note.   Should get complete colonoscopy given small polyp   appearances in left colon, unsatisfactory prep for   colonsocopy today and RLQ pain, this can only be done   once patient can tolerate preparation (in or out patient  okay)    EGD  Endoscopic Diagnosis: - Normal esophagus except GE   junction that had erythema and slight nodularity s/p biopsies   (likely from N/V)   - Slight gastric erythema but no ulcers, no old or fresh blood   seen. Biopsies taken   - Normal appearing duodenum, s/p biopsies   Recommendations: Follow up biopsy results. Other   recommendations per GI inpatient consultation note and   see colonoscopy report.    Path results pending    MR Enterogag 04/12/11  Impression:  1. Hyperenhancement of the mucosa in the proximal sigmoid colon which may be   on the basis of decompression of this segment of bowel versus mucosal   inflammation.  2. Prominent right iliac chain and inguinal lymph nodes. Recommend   correlation with right lower extremity inflammation.          Assessment and Care Plan:   Ms. Denapoli is a 45 year old woman with a Hx of DM, HTN and ischemic colitis, now presenting with 5 days of constant RLQ pain and pressure associated nausea, thick dark BM's, absent bowel sounds on physical examination and an elevated WBC concerning for ischemic colitis vs. Mesenteric ischemia vs. Bowel obstriction IBD of cecal area.      #Abdominal pain- concerning for Ischemic bowel Event, obstruction or inflammation. WBC trending down, still has nausea and RLQ pain.  Possibly due to constipation.   - I.V fluids   -appreciate G.I recs  -capsule endoscop/ and or colonoscopy  -awaiting fnal path results   -cont. unasyn  -advance diet   -Switch to PO meds, PPI  - awaiting results for Stool studies- guaiac, c. Diff toxin,   culture, Ovum parasite.   -Miralax, mineral oil enema (per GI recs)   -pain control     #UTI? -burning on urination foul smelling odor  -UA/ UC pending, on I.V antibiotics     #Yeast infection  -oral fluconazol 1x    #HTN-Continue Home meds   -Clonidine 0.3mg  T.I.D   Amlodipine 10mg    -carvedilol 25mg  B.I.D   -lisinopril 40mg  B.I.D  - PRN hydralazine    #Diabetis  SSI q6 checks   -d/c glipizide     FEN-  Bland diet     #PPx- heparin    Code Status:   Full Code      Malachy Mood, MD  Internal Medicine PGY-1  Pgr [phone]        ATTENDING PROGRESS NOTE ATTESTATION    Subjective    Patient seen, discussed, examined with team on rounds today, chart, medications, labs reviewed.  Please see resident note for details.      Interval history: Improving abdominal pain, still with minimal flatus and no BM.  No further nausea, pain is somewhat controlled, would like to continue to [facility] Diet.  Noted some mild discomfort with urination and some discharge.     Objective  Blood pressure 144/106, pulse 83, temperature 98.6 F (37 C), resp. rate 20, height 5\' 4"  (1.626 m), weight 78.5 kg (173 lb 1 oz), last menstrual period 01/10/2011, SpO2 97.00%.    I have examined the patient and concur with the resident/fellow/NP/PA exam.    Assessment and Plan    I agree with the resident/fellow/NP/PA care plan.    See the resident / fellow note for further details.    Gwynneth Munson, MD  Hospital Medicine Attending  PID [phone]

## 2011-04-14 NOTE — Plan of Care (Signed)
Problem: Alteration in Blood Glucose  Goal: Glucose level within specified parameters  Outcome: Not Met  Blood sugar checked AC&HS, 132 at  Breakfast, 170 at lunch with 1 unit of Lispro coverage, no s/s of glycemic reactions noted.Continue to monitor.    Problem: Skin Integrity Impaired: Secondary to pressure ulcer(admission or hospital acquired), cellulitis, incontinence, surgical wound/incision, chronic wound or skin disease  Goal: Absence of infection signs and symptoms  Outcome: Met  Remains with scattered skin rashes generalized and medicated with Benadryl 25mg  po for itching.No new skin issues noted.Continues to receive ivpb abx Ampicillin with no adverse reaction noted.Remains afebrile.Latest WBC-8.3

## 2011-04-14 NOTE — Plan of Care (Signed)
Text paged Dr. Christella Hartigan for pt.  c/o of abd. cramping/spasm.Dulcolax supp. given with no result yet.C/O also of tightness on her chest,BP-144/106,Clonidine0.3mg  po. given,AP-83.no sob, Thanks

## 2011-04-14 NOTE — Plan of Care (Signed)
Problem: Constipation  Goal: Bowel elimination without discomfort  Reference [facility] Assessment/reassessment standard of care.  Outcome: Not Met  No BM for 5 days and MD aware with orders.Dulcolax suppository 1 given per rectum with no result buit able able to pass out small amount of gas.  Goal: Stool elimination within specified parameters  Outcome: Not Met  Mineral oil fleet enema given with small amount of mucus out but no actual bowel movement.Passed out small amount of gas.

## 2011-04-14 NOTE — Procedures (Signed)
 PERFORMED ON - 04/14/2011 14:55:00;   DONE BY - FRICK, ALEX;  PROCEDURE - EKG;   EKG MACHINE #: EKG2;   ORDERING PHYSICIAN: DEKORTE;   ADVERSE REACTIONS: NONE;   COMMENT: CHEST PAIN NOS  ELECTRONIC SIGNATURE DERIVED FROM A SINGLE CONTROLLED ACCESS PASSWORD:   Freddy Finner ; 04/14/2011 15:11:00

## 2011-04-14 NOTE — Plan of Care (Signed)
Problem: Tissue Perfusion - Cardiopulmonary, Altered  Goal: Hemodynamic stability  Outcome: Not Met  Had 1x episode of high BP 144/106 when she was complaining of severe abdominal cramps/spasm with tightness on her chest but no actual chest pain.Clonidine 0.3mg  po given.MD notifies.Continue to monitor.    Problem: Falls - Risk of  Goal: Absence of falls  Outcome: Met  Call light within reach,reinstructed to call for assistance.Bed locked in a low position, siderails up x2, brakes  on.Hourly rounds done.Safety measures maintained and room free of clutters.No fall/injury noted.Ambulates to the BR with steady gait.        Problem: Discharge Planning  Goal: Participation in care planning  Outcome: Met  No dc order yet , compliant with her POC.Scheduled for colonoscopy tomorrow.NPO after MN.  Goal: Able to perform ADL- independently or with minimal assist  Outcome: Met  Independent with her ADLS.Up and about.

## 2011-04-14 NOTE — Interdisciplinary (Signed)
Jettie Booze / [phone]  FYI pt Luevano wants to be check with her urine as she claimed that it has a strong odor & amber colored which she said is unusual for her, just FYI. Thanks, Deitra Craine/Flo RN

## 2011-04-14 NOTE — Plan of Care (Signed)
Problem: Tissue Perfusion - Cardiopulmonary, Altered  Goal: Hemodynamic stability  Outcome: Met  Pt remains hemodynamically stable, VSS, routine BP meds given @ 2100, no c/o chest pain or SOB        Problem: Falls - Risk of  Goal: Absence of falls  Outcome: Met  Call light within reach, nonskid socks on, pt's needs assessed and hourly rounding done for pt safety. Pt without injury/fall this shift        Problem: Discharge Planning  Goal: Participation in care planning  Outcome: Not Met  No D/C plans yet this time but pt is cooperative with her POC which is ongoing    Problem: Pain - Acute  Goal: Communication of presence of pain  Outcome: Met  Pt reports 10/10 pain to abdomen described as dull. Patient medicated with hydromorphone with good relief with  pt reporting  Pain score of 5/10        Problem: Nausea/Vomiting  Goal: Absence of nausea  Outcome: Met  Pt denies N&V, able to tolerate new diet of low residue/ bland , tolerated p.o fluids    Problem: Alteration in Blood Glucose  Goal: Glucose level within specified parameters  Outcome: Met  FS @ 2100 was 165, no coverage for Lispro tonoc per SSI    Problem: Skin Integrity Impaired: Secondary to pressure ulcer(admission or hospital acquired), cellulitis, incontinence, surgical wound/incision, chronic wound or skin disease  Goal: Absence of infection signs and symptoms  Outcome: Met  Skin is intact except that pt continues to c/o pruritus with prn Benadryl p.o given with slight relief, MD aware

## 2011-04-15 ENCOUNTER — Other Ambulatory Visit: Payer: Self-pay

## 2011-04-15 LAB — CBC WITH DIFF, BLOOD
Abs Eosinophils: 0.3 10*3/uL (ref 0.0–0.5)
Abs Lymphs: 3.1 10*3/uL (ref 0.8–3.1)
Abs Monos: 0.5 10*3/uL (ref 0.2–0.8)
Absolute Neutrophil Count: 6 10*3/uL (ref 1.6–7.0)
Eosinophils: 3 % (ref 1–7)
Hct: 36.5 % (ref 34.0–45.0)
Hgb: 12.2 gm/dL (ref 11.2–15.7)
Lymphocytes: 31 % (ref 19–53)
MCH: 28.2 pg (ref 26.0–32.0)
MCHC: 33.4 % (ref 32.0–36.0)
MCV: 84.5 um3 (ref 79.0–95.0)
MPV: 9.7 fL (ref 9.4–12.4)
Monocytes: 5 % (ref 5–12)
Plt Count: 246 10*3/uL (ref 140–370)
RBC: 4.32 10*6/uL (ref 3.90–5.20)
RDW: 13.6 % (ref 12.0–14.0)
Segs: 60 % (ref 34–71)
WBC: 9.9 10*3/uL (ref 4.0–10.0)

## 2011-04-15 LAB — GLUCOSE POCT, BLOOD
Meter Glucose (POC): 128 mg/dL — ABNORMAL HIGH (ref 70–115)
Meter Glucose (POC): 150 mg/dL — ABNORMAL HIGH (ref 70–115)
Meter Glucose (POC): 118 mg/dL — ABNORMAL HIGH (ref 70–115)

## 2011-04-15 LAB — COMPREHENSIVE METABOLIC PANEL, BLOOD
ALT (SGPT): 31 U/L (ref 0–33)
AST (SGOT): 33 U/L — ABNORMAL HIGH (ref 0–32)
Albumin: 3.9 g/dL (ref 3.5–5.2)
Alkaline Phos: 56 U/L (ref 35–140)
BUN: 8 mg/dL (ref 6–20)
Bicarbonate: 24 mmol/L (ref 22–29)
Bilirubin, Tot: 0.2 mg/dL (ref <1.2)
Calcium: 9 mg/dL (ref 8.6–10.5)
Chloride: 98 mmol/L (ref 98–107)
Creatinine: 0.44 mg/dL — ABNORMAL LOW (ref 0.51–0.95)
Glucose: 142 mg/dL — ABNORMAL HIGH (ref 70–115)
Potassium: 4 mmol/L (ref 3.5–5.1)
Sodium: 133 mmol/L — ABNORMAL LOW (ref 136–145)
Total Protein: 6.9 g/dL (ref 6.0–8.0)

## 2011-04-15 LAB — ANTI-NUCLEAR-AB-TITER, BLOOD: Anti-Nuclear Ab Titer: 1:40 {titer}

## 2011-04-15 LAB — LACTATE, BLOOD: Lactate: 18.3 mg/dL (ref 4.5–19.8)

## 2011-04-15 LAB — GFR: GFR: >60 mL/min

## 2011-04-15 LAB — ANTI-NEUTRO CYTOPLASMA AB, BLOOD: Anti-Neutro Cytoplasm Ab: NEGATIVE

## 2011-04-15 LAB — ANA (ANTI-NUCLEAR AB), BLOOD: ANA (Anti-Nuclear Ab): POSITIVE

## 2011-04-15 MED ORDER — PEG 3350-KCL-NABCB-NACL-NASULF 236 GM OR SOLR
4000.00 mL | Freq: Once | ORAL | Status: AC
Start: 2011-04-15 — End: 2011-04-15
  Filled 2011-04-15: qty 4000

## 2011-04-15 MED ORDER — METOCLOPRAMIDE HCL 10 MG OR TABS
10.0000 mg | ORAL_TABLET | Freq: Three times a day (TID) | ORAL | Status: DC
Start: 2011-04-15 — End: 2011-04-19
  Filled 2011-04-15 (×11): qty 1

## 2011-04-15 NOTE — Plan of Care (Signed)
Patient aware about her transfer to telemetry unit.

## 2011-04-15 NOTE — Plan of Care (Signed)
Hand off report given to Flo RN

## 2011-04-15 NOTE — Plan of Care (Signed)
Text paged Dr. Christella Hartigan informing that the EKG result is in her chart.

## 2011-04-15 NOTE — Progress Notes (Signed)
Daily Progress Note:  04/11/2011     Current Hospital Stay:   7 days - Admitted on: 04/08/2011    CC: abdominal pain    Subjective:  Ongoing RLQ abdominal pain  She did not prep for colonoscopy or capsule endoscopy.    Objective:  Vital Signs:  Temperature:  [98 F (36.7 C)-98.6 F (37 C)] 98.4 F (36.9 C) (09/04 0727)  Blood pressure (BP): (106-150)/(72-108) 150/100 mmHg (09/04 0840)  Heart Rate:  [65-84] 77  (09/04 0840)  Respirations:  [16-20] 18  (09/04 0727)  Pain Score:  [-] 8 (09/04 1000)  O2 Device:  [-] None (Room air) (09/04 0539)  SpO2:  [95 %-98 %] 98 % (09/04 0727)  Physical Exam:  General: Ill appearing   HEENT: Op clear, anicteric   Neck: Full rom   Lungs: ctab   CV: Tachy, regular   Abdomen: Soft, diffuse ttp but >>RLQ; no rebound or guarding   Extremities: Nl joints without effusions   Neuro: nonfocal    Laboratory data:   Lab Results   Component Value Date    NA 133* 04/15/2011    K 4.0 04/15/2011    CL 98 04/15/2011    BICARB 24 04/15/2011    BUN 8 04/15/2011    CREAT 0.44* 04/15/2011    GLU 142* 04/15/2011    [place] 9.0 04/15/2011     Lab Results   Component Value Date    WBC 9.9 04/15/2011    HGB 12.2 04/15/2011    HCT 36.5 04/15/2011    PLT 246 04/15/2011     Lab Results   Component Value Date    AST 33* 04/15/2011    ALT 31 04/15/2011    ALK 56 04/15/2011    TBILI 0.2 04/15/2011    TP 6.9 04/15/2011    ALB 3.9 04/15/2011     Procedures/Images:      COLO:  - GIF H180 scope advanced to appearances of cecum, can not confirm as solid impacted stool can not be cleared.   - Normal appearing mucosa in left colon mostly (although stool covering mucosa, once cleared with irrigation, appeared healthy underneath) except splenic flexture area (35 cm-40 cm proximal to anal verge) where there is erythema, also diverticula observed in this area. S/P biopsies.   - Slight rectal erythema, s/p biopsies   - Right colon mucosa appears healthy (see above for extent of exam), s/p biopsies     EGD:   - Normal esophagus except GE  junction that had  erythema and slight nodularity s/p biopsies   (likely from N/V)   - Slight gastric erythema but no ulcers, no old or fresh blood seen. Biopsies taken   - Normal appearing duodenum, s/p biopsies   MR enterography:  Findings:    Adequate distention of the proximal bowel obtained. Distal small bowel was   poorly distended.    The small bowel and proximal large bowel are unremarkable, without evidence of  mural thickening, stenoses, fistulae or focal dilatation. Symmetric mural   enhancement is identified in the small bowel and proximal large bowel, without  evidence of mucosal hyperenhancement or engorgement of the vasa recta.    Within the proximal sigmoid colon, there is apparent hyperenhancement of the   mucosa. No pericolonic inflammatory change is noted.    Mildly prominent right iliac chain and inguinal lymph nodes are noted   measuring up to 9 mm. No other suspicious adenopathy is identified.    Remainder of the visualized  solid viscera are unremarkable.      Impression:    1. Hyperenhancement of the mucosa in the proximal sigmoid colon which may be   on the basis of decompression of this segment of bowel versus mucosal   inflammation.  2. Prominent right iliac chain and inguinal lymph nodes. Recommend   correlation with right lower extremity inflammation.      A/P:   Katherine Church is a 45 year old female with hx of DM, HTN and episode of ischemic colitis in 2011, presenting with RLQ abdominal pain, nausea and what may be melena vs hematochezia. Patient did have ischemic colitis in 2011 and is having similar sx now, but no CT evidence of disease and pain is in the RLQ at this time. High suspicion for NSAIDs as culprit for possible NSAID colitis or enteritis vs ischemia.  She has EGD/ Colonoscopy and results are mentioned above. Biopsy result are pending. Her stool is positive for WBC. Her WBC is 9.9 and her lactate is 18.  Her MR enterography showed Hyperenhancement of the mucosa in the proximal sigmoid  colon which may be   on the basis of decompression of this segment of bowel versus mucosal inflammation. Her C diff PCR is neg. She has no growth of organism in her stool culture.        Recommendations:   -- Will prep her for possible repeat colonoscopy/ capsule endoscopy for tomorrow.  -- Will keep her NPO afternoon for prep.  -- Check her CBC, chemistry and Lactate.  -- Case  DW attending Dr. Hart [place] and agrees with this plan.         Attending note:Mrs. Gordner's case was reviewed on afternoon rounds, to include review of available laboratory and radiology results. I agree with documentation including recommendations as outlined above.

## 2011-04-15 NOTE — Plan of Care (Signed)
Hand off report given to Catherine, RN

## 2011-04-15 NOTE — Procedures (Signed)
 PERFORMED ON - 04/15/2011 14:50:09;   DONE BY - MINDLIN, OLEG;  PROCEDURE - EKG;   EKG MACHINE #: EKG2;   ADVERSE REACTIONS: NONE;   ELECTRONIC SIGNATURE DERIVED FROM A SINGLE CONTROLLED ACCESS PASSWORD:   Michael Boston ; 04/15/2011 14:50:09

## 2011-04-15 NOTE — Interdisciplinary (Signed)
Report received from [place], [place]. Patient arrived to unit at 2015. Pt oriented to room and call light function and use. Pt kept personal belongings at bedside.

## 2011-04-15 NOTE — Procedures (Signed)
FINAL PATHOLOGIC DIAGNOSIS:  A: Stomach, biopsy       -Gastric mucosa with no significant histopathology.       -No Helicobacter organisms identified.  B: Small bowel, biopsy       -Small intestinal mucosa with no significant histopathology.  C: Gastroesophageal junction, biopsy       -Gastroesophageal junction with foveolar hyperplasia and reactive      changes of gastric cardia.  D: Left colon, random, biopsy       -Colonic mucosa with minimal lamina propria fibrosis and no other      significant histopathology.  E: Colon, right, biopsy       -Colonic mucosa with no significant histopathology.  F: Rectum, biopsy       -Rectal mucosa with minimal lamina propria fibrosis and no other      significant histopathology.     SPECIMEN(S) SUBMITTED:  A: gastric biopsy  B: small bowel biopsy  C: gastroesophageal junction biopsy  D: random left colon biopsy  E: right colon biopsy  F: rectum  CLINICAL HISTORY:  History of left colon ischemia, ? due to hypertensive  urgency - stool impaction. Now nausea, vomiting and diarrhea status post  biopsy. Slight erythema at splenic flexure.     GROSS DESCRIPTION:  A: The specimen (received in formalin, labeled with the patient's name,  medical record number and "gastric bx") consists of seven fragments of  tan-yellow soft tissue ranging from 0.1 to 0.7 cm in greatest dimension.  The specimen is entirely submitted in cassette A1.  B: The specimen (received in formalin, labeled with the patient's name,  medical record number and "small bowel") consists of three fragments of  tan-red soft tissue ranging from 0.2 to 0.5 cm in greatest dimension. The  specimen is entirely submitted in cassette B1.  C: The specimen (received in formalin, labeled with the patient's name,  medical record number and "GE junction") consists of a single fragment of  tan-red soft tissue measuring 0.4 cm in greatest dimension. The specimen is  entirely submitted in cassette C1.  D: The specimen (received in  formalin, labeled with the patient's name,  medical record number and "random left colon") consists of five fragments  of tan-red soft tissue ranging from 0.1 to 0.5 cm in greatest dimension.  The specimen is entirely submitted in cassette D1.  E: The specimen (received in formalin, labeled with the patient's name,  medical record number and "right colon") consists of eight fragments of  tan-yellow soft tissue ranging from 0.2 to 0.7 cm in greatest dimension.  The specimen is entirely submitted in cassette E1.  F: The specimen (received in formalin, labeled with the patient's name,  medical record number and "rectum") consists of four fragments of tan-red  soft tissue ranging from 0.3 to 0.4 cm in greatest dimension. The specimen  is entirely submitted in cassette F1.  MM/PK/jb  CONFIDENTIAL HEALTH INFORMATION: Health Care information is personal and  sensitive information. If it is being faxed to you it is done so under  appropriate authorization from the patient or under circumstances that do  not require patient authorization. You, the recipient, are obligated to  maintain it in a safe, secure and confidential manner. Re-disclosure  without additional patient consent or as permitted by law is prohibited.  Unauthorized re-disclosure or failure to maintain confidentiality could  subject you to penalties described in federal and state law.  If you have  received this report or facsimile in error, please notify  the [place] [facility]  [facility] Department immediately and destroy the received document(s).   Material reviewed and Interpreted and  Report Electronically Signed by:  Sofie Rower M.D., PhD [phone])  Attending Surgical Pathologist  04/15/11 13:01  Electronic Signature derived from a single  controlled access password

## 2011-04-15 NOTE — Plan of Care (Signed)
Problem: Pain - Acute  Goal: Communication of presence of pain  Outcome: Met  Calls for pain meds appropriately.  Goal: Control of acute pain  Outcome: Met  C/O of severe abdominal cramping /spasm, medicated with Vicodin 2 tabs po alternated with Dilaudid iv 1mg  with relief, pain scale down to 4/10 which is acceptable for her.Up and about in the room.    Problem: Nausea/Vomiting  Goal: Absence of nausea  Outcome: Not Met  Still c/o of nausea but lesser.Medicated with Phenergan 12.5mg  po x2 with relief.No emesis noted.Tolerated her clear liquid diet.    Problem: Alteration in Blood Glucose  Goal: Glucose level within specified parameters  Blood sugar checked,128 at breakfast,150 at lunch with no insulin coverage required.Continue to monitor.    Problem: Skin Integrity Impaired: Secondary to pressure ulcer(admission or hospital acquired), cellulitis, incontinence, surgical wound/incision, chronic wound or skin disease  Goal: Absence of infection signs and symptoms  Outcome: Met  Continues to receive Ampicillin ivpb abx with no adverse reaction noted.Remaisn afebrile.Latest WBC-9.9. No new skin issues aside from the scattered rashes on her chest,back and all extremities.Denies any itching at this time.    Problem: Constipation  Goal: Stool elimination within specified parameters  Outcome: Met  Had bm x4 today, 2 formed large and 2 soft , moderate.Receiving Golytely for the colonoscopy prep tomorrow.Passes out gas.

## 2011-04-15 NOTE — Plan of Care (Signed)
Problem: Tissue Perfusion - Cardiopulmonary, Altered  Goal: Hemodynamic stability  Outcome: Met  At initial shift, pt anxious and stating "When I get like this, it feels like my heart is racing and I have palpitations." BP elevated when anxious, 147/108, HR 84. Explained to pt that previous cardiac workups have been negative and that what she is feeling is most likely related to her anxiety. Pt then stated "I think you may be right. Can I take some Ativan?" PO ativan 2mg  given at 1945, met with good relief and no further anxiety noted. Latest BP 129/92, HR 82. Lungs CTA, no edema noted. Will cont to monitor.     Problem: Falls - Risk of  Goal: Absence of falls  Outcome: Met  No new injury/falls noted. Pt ambulatory with steady gait. Bedside commode provided for frequent trips to restroom d/t laxatives. Hourly rounds ongoing for visual safety checks.     Problem: Discharge Planning  Goal: Participation in care planning  Outcome: Met  Pt participating with plan of care, independent with ADLs. POC ongoing, including IV antibiotics and plan for colonoscopy 04/15/11.     Problem: Pain - Acute  Goal: Communication of presence of pain  Outcome: Met  Pt c/o 8/10 lower abd cramping pain at initial shift, medicated with IV dilaudid 1mg  as ordered prn, see MAR. Pain reassessed one hour after IV per protocol, pt reports pain decreased to tolerable 6/10. No further c/o pain so far this shift, pt sleeping with even respirations. Will cont to monitor.    Problem: Nausea/Vomiting  Goal: Absence of nausea  Outcome: Met  Pt denies any nausea so far this shift, was tolerating clear liquid diet. Now NPO since midnight for colonoscopy 04/15/11.     Problem: Alteration in Blood Glucose  Goal: Glucose level within specified parameters  Outcome: Met  Blood sugars monitored ac/hs, FSG at HS= 146. No insulin coverage required per sliding scale.     Problem: Skin Integrity Impaired: Secondary to pressure ulcer(admission or hospital acquired),  cellulitis, incontinence, surgical wound/incision, chronic wound or skin disease  Goal: Absence of infection signs and symptoms  Outcome: Met  No new skin issues noted. Pt reports that reddened rash to back and chest, arms and legs is improving, denies any pruritis.     Problem: Constipation  Goal: Bowel elimination without discomfort  Reference [facility] center Assessment/reassessment standard of care.   Outcome: Met  Pt has had two BMs so far this shift, small and soft. Pt continues to c/o occasional abdominal cramping.

## 2011-04-15 NOTE — Plan of Care (Signed)
Pt had orders to be transferred to tele prior to change of shift, day shift RN Flo gave report to this RN, but Flo was able to reach [employer] on 10E and give report as well.  Pt was transferred to [id number] via bed in stable condition with all personal belongings with her as well as 2 containers GoLytely, c/o 5/10 abdominal pain, which pt reported was tolerable.

## 2011-04-15 NOTE — Progress Notes (Addendum)
Medicine Intern Progress Note: 04/15/2011     Current Hospital Stay:   7 days - Admitted on: 04/08/2011    Subjective: Overnight had episode of palpations followed by intense abdominal cramping. Had BM. Still with abdominal pain      Medications:      . fluCONazole  200 mg Once   . mineral oil  1 enema Once   . polyethylene glycol  17 g Daily   . DISCONTD: PEG 3350 with electrolytes  4,000 mL Once   . cetirizine  10 mg Daily   . ampicillin-sulbactam (UNASYN) IVPB  3,000 mg Q6H   . nystatin  1 tablet HS   . docusate sodium  250 mg Daily   . sennosides  10 mL HS   . glucagon  1 mg On Call   . insulin lispro  1-10 Units 4x Daily AC & HS   . heparin  5,000 Units Q8H   . carvedilol  25 mg BID   . lisinopril  40 mg BID   . amLODIPINE  10 mg Daily   . clonidine  0.3 mg TID   . sodium chloride  3 mL Q8H     IV Meds:      . sodium chloride       PRN Meds:      . promethazine  12.5 mg Q6H PRN   . bisacodyl  10 mg Daily PRN   . HYDROmorphone  1 mg Q2H PRN   . docusate sodium  250 mg BID PRN   . HYDROcodone-acetaminophen  1 tablet Q4H PRN   . HYDROcodone-acetaminophen  2 tablet Q4H PRN   . diphenhydrAMINE  25 mg Q6H PRN   . simethicone  80 mg Q6H PRN   . DISCONTD: HYDROmorphone  1 mg Q2H PRN   . glucose  4 tablet PRN   . glucose 40%  1 Tube PRN   . dextrose  12.5 g PRN   . glucagon  1 mg Once PRN   . LORazepam  2 mg Q6H PRN   . calcium carbonate  500 mg Q3H PRN   . DISCONTD: promethazine (PHENERGAN) IVPB  6.25 mg Q6H PRN   . sodium chloride  3 mL PRN   . sodium chloride   Continuous PRN   . acetaminophen  650 mg Q4H PRN   . ondansetron (ZOFRAN) IVPB  8 mg Q8H PRN   . hydrALAZINE  10 mg Q6H PRN   . zolpidem  5 mg Nightly PRN       Objective:  Vital Signs:  Latest Entry Range (last 24 hours)  Temperature: 98.6 F (37 C) Temp  Avg: 98.4 F (36.9 C)  Min: 98 F (36.7 C)  Max: 98.6 F (37 C)  Blood pressure (BP): 106/72 mmHg BP  Min: 106/72  Max: 147/108  Heart Rate: 65  Pulse  Avg: 80.8   Min: 65   Max: 86   Respirations: 16   Resp  Avg: 18   Min: 16   Max: 20   SpO2: 96 % SpO2  Avg: 96.2 %  Min: 95 %  Max: 97 %    Gen: Well appearing, WNWD, NAD  Neuro: A+O x3, nonfocal  HEENT: OP clear, mucosa moist, PERRL  Lung: CTA bila, no c/w/r  CV: RRR, no m/r/g  Abd: +BS decreased, obese abdomen, soft, No masses, no HSM, tender Palpation LLQ abdomen  Ext: no edema, clubbing, or cyanosis  Skin: ourouric rash back, arms, legs  Intake/Output (24hrs):  09/03 0600 - 09/04 0559  In: 1456 [P.O.:1200; I.V.:256]  Out: 200 [Urine:200]            Laboratory data:   Recent Labs   [facility] 04/14/11 0733 04/13/11 1105 04/12/11 0702    NA 135* 137 138    K 3.8 3.8 3.6    CL 99 99 101    BICARB 23 25 24     BUN 12 11 10     CREAT 0.45* 0.47* 0.48*    GLU 144* 239* 166*    [place] 8.9 9.1 9.1    MG -- -- --    PHOS -- -- --    AST 29 28 --    ALT 25 25 --    ALK 65 64 --    TBILI 0.2 0.2 --    PTT -- -- --    INR -- -- --     Recent Labs   [facility]ename 04/14/11 0733 04/13/11 0727 04/12/11 0702    WBC 8.3 9.5 10.4*    HGB 12.8 14.3 12.1    HCT 36.8 41.0 36.5    PLT 191 200 204    BAND -- -- --    SEG 62 71 74*    LYMPHS 27 19 17*    MONO -- -- --       Micro:  Lab Results   Component Value Date    BLOODCULT  Value: No Growth after 24 hour/s of incubation. No Growth after 48 hour/s of incubation. No Growth after 72 hour/s of incubation. No Growth after 96 hour/s of incubation. 04/10/2011    BLOODCULT  Value: No Growth after 24 hour/s of incubation. No Growth after 48 hour/s of incubation. No Growth after 72 hour/s of incubation. No Growth after 96 hour/s of incubation. 04/10/2011    BLOODCULT No Growth after 5 day/s of incubation. 09/04/2009    BLOODCULT No Growth after 5 day/s of incubation. 09/04/2009         CXR:   Results for orders placed during the hospital encounter of 04/08/11   X-RAY CHEST FRONTAL AND LATERAL    Narrative:     CLINICAL HISTORY: Suspected pneumonia                                    TECHNIQUE:                   PA and lateral views of the chest.                                     COMPARISON:                  Chest radiograph dated 05/18/2009.                                    FINDINGS:                                    No consolidation, pleural effusion, or pneumothorax demonstrated.  Possible                   calcified nodule in the left upper lobe.  Unremarkablecardiomediastinal silhouette.                                    No evidence of acute osseous abnormalities.                                    IMPRESSION:                                    No acute findings.                                    I have reviewed the images and agree with the resident's interpretation.     Stool studies   c diff neg   Stool cutlures pending   Stool WBC positive   Colonoscopy   Endoscopic Diagnosis: - Enema prep.   - GIF H180 scope advanced to appearances of cecum,   can not confirm as solid impacted stool can not be cleared.   - Normal appearing mucosa in left colon mostly (although   stool covering mucosa, once cleared with irrigation,   appeared healthy underneath) except splenic flexture area   (35 cm-40 cm proximal to anal verge) where there is   erythema, also diverticula observed in this area. S/P   biopsies.   - Slight rectal erythema, s/p biopsies   - Right colon mucosa appears healthy (see above for extent   of exam), s/p biopsies   - Retroflexion not done as can not hold in air   Recommendations: Follow biopsy results, other   recommendations per in-patient GI consultation note.   Should get complete colonoscopy given small polyp   appearances in left colon, unsatisfactory prep for   colonsocopy today and RLQ pain, this can only be done   once patient can tolerate preparation (in or out patient okay)     EGD   Endoscopic Diagnosis: - Normal esophagus except GE   junction that had erythema and slight nodularity s/p biopsies   (likely from N/V)   - Slight gastric erythema but no ulcers, no old or fresh blood   seen. Biopsies taken    - Normal appearing duodenum, s/p biopsies   Recommendations: Follow up biopsy results. Other   recommendations per GI inpatient consultation note and   see colonoscopy report.     Path results pending   MR Enterogag 04/12/11   Impression:  1. Hyperenhancement of the mucosa in the proximal sigmoid colon which may be   on the basis of decompression of this segment of bowel versus mucosal   inflammation.  2. Prominent right iliac chain and inguinal lymph nodes. Recommend   correlation with right lower extremity inflammation.        Assessment and Care Plan:   Katherine Church is a 45 year old woman with a Hx of DM, HTN and ischemic colitis, now presenting with 5 days of constant RLQ pain and pressure associated nausea, thick dark BM's, absent bowel sounds on physical examination and an elevated WBC concerning for ischemic colitis vs. Mesenteric ischemia vs. Bowel obstriction IBD of cecal area.   #Abdominal pain- concerning for Ischemic bowel Event, obstruction or inflammation. WBC trending down, still has nausea  and RLQ pain. Possibly due to constipation.   - I.V fluids   -appreciate G.I recs   -capsule endoscop/ and or colonoscopy   -awaiting fnal path results   -cont. unasyn   -NPO for procedure  -Switch to PO meds, PPI   - awaiting results for Stool studies- guaiac, c. Diff toxin,   culture, Ovum parasite.   -Miralax, mineral oil enema (per GI recs)   -pain control     #UTI? -burning on urination foul smelling odor, symptoms resolved    -UA/ UC dirty UA , on I.V antibiotics     #Yeast infection -resolved  -oral fluconazol 1x     #HTN-Continue Home meds   -Clonidine 0.3mg  T.I.D   Amlodipine 10mg    -carvedilol 25mg  B.I.D   -lisinopril 40mg  B.I.D   - PRN hydralazine     #Diabetis   SSI q6 checks   -d/c glipizide     FEN-   NPO pending procedure    #PPx- heparin     Code Status:   Full Code       Malachy Mood, MD  Internal Medicine PGY-1  Pgr [phone]    ATTENDING PROGRESS NOTE ATTESTATION    Subjective    Patient seen,  discussed, examined with team on rounds today, chart, medications, labs reviewed.  Please see resident note for details.      Interval history: Patient continues to have abdominal pain, unable to tolerate diet and was unable to do prep for colonoscopy overnight secondary to pain, is having +BM.     Objective  Blood pressure 154/99, pulse 97, temperature 98.4 F (36.9 C), resp. rate 18, height 5\' 4"  (1.626 m), weight 78.5 kg (173 lb 1 oz), last menstrual period 01/10/2011, SpO2 98.00%.    I have examined the patient and concur with the resident/fellow/NP/PA exam.    Assessment and Plan    I agree with the resident/fellow/NP/PA care plan.    See the resident / fellow note for further details.    Gwynneth Munson, MD  Hospital Medicine Attending  PID [phone]

## 2011-04-15 NOTE — Plan of Care (Signed)
Problem: Tissue Perfusion - Cardiopulmonary, Altered   Goal: Hemodynamic stability   Outcome: Not Met    BP continuously high today ranges -143/93,150/100,154/99,AP- 77-97/min.On routine BP meds.C/O of palpitations but AP-88/min when checked.Dr. Vonda Antigua aware and ordered EKG.   Problem: Falls - Risk of   Goal: Absence of falls   Outcome: Met   Call light within reach,reinstructed to call for assistance.Bed locked in a low position, siderails up x2, brakes on.Hourly rounds done.Safety measures maintained and room free of clutters.No fall/injury noted.Ambulates to the BR with steady gait.   Problem: Discharge Planning   Goal: Participation in care planning   Outcome: Met   No dc order yet , compliant with her POC.Colonoscopy cancelled today and rescheduled tomorrow.Continues to receive Ampicillin ivpb abx   Goal: Able to perform ADL- independently or with minimal assist   Outcome: Met   Independent with her ADLS.Up and about.

## 2011-04-16 LAB — CBC WITH DIFF, BLOOD
Abs Eosinophils: 0.3 10*3/uL (ref 0.0–0.5)
Abs Lymphs: 3 10*3/uL (ref 0.8–3.1)
Abs Monos: 0.6 10*3/uL (ref 0.2–0.8)
Absolute Neutrophil Count: 5.7 10*3/uL (ref 1.6–7.0)
Eosinophils: 3 % (ref 1–7)
Hct: 36 % (ref 34.0–45.0)
Hgb: 12.2 gm/dL (ref 11.2–15.7)
Lymphocytes: 32 % (ref 19–53)
MCH: 28.4 pg (ref 26.0–32.0)
MCHC: 33.9 % (ref 32.0–36.0)
MCV: 83.9 um3 (ref 79.0–95.0)
MPV: 10 fL (ref 9.4–12.4)
Monocytes: 6 % (ref 5–12)
Plt Count: 234 10*3/uL (ref 140–370)
RBC: 4.29 10*6/uL (ref 3.90–5.20)
RDW: 13.4 % (ref 12.0–14.0)
Segs: 59 % (ref 34–71)
WBC: 9.6 10*3/uL (ref 4.0–10.0)

## 2011-04-16 LAB — BASIC METABOLIC PANEL, BLOOD
BUN: 6 mg/dL (ref 6–20)
Bicarbonate: 26 mmol/L (ref 22–29)
Calcium: 9.3 mg/dL (ref 8.6–10.5)
Chloride: 100 mmol/L (ref 98–107)
Creatinine: 0.45 mg/dL — ABNORMAL LOW (ref 0.51–0.95)
Glucose: 132 mg/dL — ABNORMAL HIGH (ref 70–115)
Potassium: 3.8 mmol/L (ref 3.5–5.1)
Sodium: 141 mmol/L (ref 136–145)

## 2011-04-16 LAB — BLOOD CULTURE
Blood Culture: NO GROWTH
Blood Culture: NO GROWTH

## 2011-04-16 LAB — ECG, COMPLETE (HC/~~LOC~~/ENCINITAS)
QRS INTERVAL/DURATION: 88 ms
VENTRICULAR RATE: 83 {beats}/min

## 2011-04-16 LAB — URINE CULTURE: Urine Culture: NO GROWTH

## 2011-04-16 LAB — GFR: GFR: >60 mL/min

## 2011-04-16 LAB — GLUCOSE POCT, BLOOD: Meter Glucose (POC): 145 mg/dL — ABNORMAL HIGH (ref 70–115)

## 2011-04-16 NOTE — Plan of Care (Addendum)
Problem: Tissue Perfusion - Cardiopulmonary, Altered  Goal: Hemodynamic stability  Pt denies cResults for XYA, GENUNG (MRN [id number]) as of 04/16/2011 16:42  hest pain, VS stable, afebrile, neuro intact. Tele shows NSR.   Results for KARLIA, SCHLAEFER (MRN [id number]) as of 04/16/2011 16:42    04/16/2011 07:16   Sodium 141   Potassium 3.8   Chloride 100   Bicarbonate 26   Bun 6   Creatinine 0.45 (L)   GFR >60   Glucose 132 (H)   Calcium 9.3       Goal: Early recognition of deterioration  Pt not in acute distress.     Problem: Falls - Risk of  Goal: Absence of falls  Pt able to ambulate to bathroom with steady gait. Pt compliant and calls for assistance. Pt went to GI suite for procedure. No fall.     Problem: Discharge Planning  Goal: Participation in care planning  Updated pt with plans and treatments. Pt aware of colonoscopy today. Finished Golytely and was cooperative in being NPO.     Problem: Pain - Acute  Goal: Communication of presence of pain  Pt received dilaudid 1 mg for pain level of 8/10 on abdomen with moderate relief 5/10. Then received Vicodin 2 tabs for pain level 8/10 on abdomen with moderate relief 4/10. Will continue to monitor for pain    Pt received dilaudid 1 mg 1656 when she returned from GI (colonoscopy) with pain level 8/10 on abdomen. Will continue to monitor for pain.     Problem: Nausea/Vomiting  Goal: Absence of nausea  Outcome: Met  Pt denied nausea and vomiting. Pt was NPO this shift.    Started with full liquid diet post colonoscopy.     Problem: Constipation  Goal: Bowel elimination without discomfort  Reference [facility] medical center Assessment/reassessment standard of care.    Diarrhea from Golytely for colonoscopy. Now pt on full liquid diet.

## 2011-04-16 NOTE — Interdisciplinary (Signed)
Received report from GI. Pt tolerated colonoscopy. She received 9 mg Versed, 200 mg Demerol, and 50 mg Benedryl. Pt's BP 110/70, HR 70. A&O but slightly drowsy. GI found ulcerations and biopsied sites. Pt on the way back up to floor.
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
## 2011-04-16 NOTE — Progress Notes (Addendum)
Medicine Intern Progress Note: 04/15/2011     Current Hospital Stay:   8 days - Admitted on: 04/08/2011    Subjective: No over night events, tolerated colon prep, stated felt relief after multiple BM. Pain is now migrating from upper mid abdomen down to suprapubic area.  Had episode of palpitations at 9pm, resolved. This morning did not have any N/V/D SOB.    Medications:        . PEG 3350 with electrolytes  4,000 mL Once   . metoclopramide  10 mg TID AC   . polyethylene glycol  17 g Daily   . cetirizine  10 mg Daily   . ampicillin-sulbactam (UNASYN) IVPB  3,000 mg Q6H   . nystatin  1 tablet HS   . docusate sodium  250 mg Daily   . sennosides  10 mL HS   . glucagon  1 mg On Call   . insulin lispro  1-10 Units 4x Daily AC & HS   . heparin  5,000 Units Q8H   . carvedilol  25 mg BID   . lisinopril  40 mg BID   . amLODIPINE  10 mg Daily   . clonidine  0.3 mg TID   . sodium chloride  3 mL Q8H     IV Meds:        . sodium chloride       PRN Meds:        . promethazine  12.5 mg Q6H PRN   . bisacodyl  10 mg Daily PRN   . HYDROmorphone  1 mg Q2H PRN   . docusate sodium  250 mg BID PRN   . HYDROcodone-acetaminophen  1 tablet Q4H PRN   . HYDROcodone-acetaminophen  2 tablet Q4H PRN   . diphenhydrAMINE  25 mg Q6H PRN   . simethicone  80 mg Q6H PRN   . glucose  4 tablet PRN   . glucose 40%  1 Tube PRN   . dextrose  12.5 g PRN   . glucagon  1 mg Once PRN   . LORazepam  2 mg Q6H PRN   . calcium carbonate  500 mg Q3H PRN   . sodium chloride  3 mL PRN   . sodium chloride   Continuous PRN   . acetaminophen  650 mg Q4H PRN   . ondansetron (ZOFRAN) IVPB  8 mg Q8H PRN   . hydrALAZINE  10 mg Q6H PRN   . zolpidem  5 mg Nightly PRN       Objective:  Vital Signs:  Latest Entry Range (last 24 hours)  Temperature: 98.8 F (37.1 C) Temp  Avg: 98.6 F (37 C)  Min: 98.3 F (36.8 C)  Max: 98.8 F (37.1 C)  Blood pressure (BP): 131/95 mmHg BP  Min: 114/85  Max: 154/99  Heart Rate: 83  Pulse  Avg: 87.1   Min: 75   Max: 99   Respirations: 15   Resp  Avg: 18.1   Min: 15   Max: 20   SpO2: 100 % SpO2  Avg: 98.2 %  Min: 97 %  Max: 100 %    Gen: Well appearing, WNWD, NAD  Neuro: A+O x3, nonfocal  HEENT: OP clear, mucosa moist, PERRL  Lung: CTA bila, no c/w/r  CV: RRR, no m/r/g  Abd: +BS decreased, obese abdomen, tense rectus abdominus  soft, No masses, no HSM, tender Palpation RLQ ,abdomen  Ext: no edema, clubbing, or cyanosis  Skin: ourouric rash back, arms, legs  Intake/Output (24hrs):  09/04 0600 - 09/05 0559  In: 5320 [P.O.:5320]  Out: 1750             Laboratory data:   Recent Labs   Basename 04/15/11 0609 04/14/11 0733 04/13/11 1105    NA 133* 135* 137    K 4.0 3.8 3.8    CL 98 99 99    BICARB 24 23 25     BUN 8 12 11     CREAT 0.44* 0.45* 0.47*    GLU 142* 144* 239*    [place] 9.0 8.9 9.1    MG -- -- --    PHOS -- -- --    AST 33* 29 28    ALT 31 25 25     ALK 56 65 64    TBILI 0.2 0.2 0.2    PTT -- -- --    INR -- -- --     Recent Labs   Basename 04/15/11 0609 04/14/11 0733 04/13/11 0727    WBC 9.9 8.3 9.5    HGB 12.2 12.8 14.3    HCT 36.5 36.8 41.0    PLT 246 191 200    BAND -- -- --    SEG 60 62 71    LYMPHS 31 27 19     MONO -- -- --       Micro:  Lab Results   Component Value Date    BLOODCULT No Growth after 5 day/s of incubation. 04/10/2011    BLOODCULT No Growth after 5 day/s of incubation. 04/10/2011    BLOODCULT No Growth after 5 day/s of incubation. 09/04/2009    BLOODCULT No Growth after 5 day/s of incubation. 09/04/2009         CXR:   Results for orders placed during the hospital encounter of 04/08/11   X-RAY CHEST FRONTAL AND LATERAL    Narrative:     CLINICAL HISTORY: Suspected pneumonia                                    TECHNIQUE:                   PA and lateral views of the chest.                                    COMPARISON:                  Chest radiograph dated 05/18/2009.                                    FINDINGS:                                    No consolidation, pleural effusion, or pneumothorax demonstrated.  Possible                    calcified nodule in the left upper lobe.                                    Unremarkablecardiomediastinal silhouette.  No evidence of acute osseous abnormalities.                                    IMPRESSION:                                    No acute findings.                                    I have reviewed the images and agree with the resident's interpretation.     Stool studies   c diff neg   Stool cutlures pending   Stool WBC positive   Colonoscopy   Endoscopic Diagnosis: - Enema prep.   - GIF H180 scope advanced to appearances of cecum,   can not confirm as solid impacted stool can not be cleared.   - Normal appearing mucosa in left colon mostly (although   stool covering mucosa, once cleared with irrigation,   appeared healthy underneath) except splenic flexture area   (35 cm-40 cm proximal to anal verge) where there is   erythema, also diverticula observed in this area. S/P   biopsies.   - Slight rectal erythema, s/p biopsies   - Right colon mucosa appears healthy (see above for extent   of exam), s/p biopsies   - Retroflexion not done as can not hold in air   Recommendations: Follow biopsy results, other   recommendations per in-patient GI consultation note.   Should get complete colonoscopy given small polyp   appearances in left colon, unsatisfactory prep for   colonsocopy today and RLQ pain, this can only be done   once patient can tolerate preparation (in or out patient okay)     EGD   Endoscopic Diagnosis: - Normal esophagus except GE   junction that had erythema and slight nodularity s/p biopsies   (likely from N/V)   - Slight gastric erythema but no ulcers, no old or fresh blood   seen. Biopsies taken   - Normal appearing duodenum, s/p biopsies   Recommendations: Follow up biopsy results. Other   recommendations per GI inpatient consultation note and   see colonoscopy report.     MR Enterogag 04/12/11   Impression:  1. Hyperenhancement of the mucosa in the  proximal sigmoid colon which may be   on the basis of decompression of this segment of bowel versus mucosal   inflammation.  2. Prominent right iliac chain and inguinal lymph nodes. Recommend   correlation with right lower extremity inflammation.    Pathology  A: Stomach, biopsy   -Gastric mucosa with no significant histopathology.   -No Helicobacter organisms identified.   B: Small bowel, biopsy   -Small intestinal mucosa with no significant histopathology.   C: Gastroesophageal junction, biopsy   -Gastroesophageal junction with foveolar hyperplasia and reactive   changes of gastric cardia.   D: Left colon, random, biopsy   -Colonic mucosa with minimal lamina propria fibrosis and no other   significant histopathology.   E: Colon, right, biopsy   -Colonic mucosa with no significant histopathology.   F: Rectum, biopsy   -Rectal mucosa with minimal lamina propria fibrosis and no other   significant histopathology.      9/5/12Endoscopic Diagnosis: - There were few, focal superficial  small ulcers in the rectum, rectosigmoid junction, distal   sigmoid colon and splenic flexure with normal appearing   mucosa in-between these segments; multiple biopsies are   taken.   - 2 mm sessile polyp in the recto-sigmoid junction; removed   by cold excisional biopsy.   - Otherwise normal colon to the terminal ileum. Multiple   random biopsies are taken from terminal ileum, right colon   and left colon and placed in separate jars to evaluate for   the underlying chronic inflammation.    Rheum  04/11/11 ANA positive 1:40 titer  Anti neutro Cytoplasim neg    Assessment and Care Plan:   Ms. Gadomski is a 45 year old woman with a Hx of DM, HTN and ischemic colitis, now presenting with 5 days of constant RLQ pain and pressure associated nausea, thick dark BM's, absent bowel sounds on physical examination and an elevated WBC concerning for ischemic colitis vs. Mesenteric ischemia vs. Bowel obstriction IBD of cecal area.     #Abdominal pain-  concerning for Ischemic bowel Event, obstruction or inflammation. WBC trending down, still has nausea and RLQ pain. Possibly due to constipation.  ANA positive    - I.V fluids   -appreciate G.I recs   -capsule endoscop/ and or colonoscopy   -cont. unasyn   -pain control   -advance diet    #UTI-resolved    -UA/ UC dirty UA , on I.V antibiotics     #Yeast infection -resolved  -oral fluconazol 1x     #HTN-Continue Home meds   -Clonidine 0.3mg  T.I.D   Amlodipine 10mg    -carvedilol 25mg  B.I.D   -lisinopril 40mg  B.I.D   - PRN hydralazine     #Diabetis   SSI q6 checks   -d/c glipizide     FEN- NPO   NPO pending procedure    #PPx- heparin     Code Status:   Full Code     Malachy Mood, MD  Internal Medicine PGY-1  Pgr [phone]    ATTENDING PROGRESS NOTE ATTESTATION    Subjective    Patient seen, discussed, examined with team on rounds today, chart, medications, labs reviewed.  Please see resident note for details.      Interval history: Patient tolerated prep overnight, headed for colonoscopy today, no other acute complaints.  Pain is controlled and slightly less today.     Objective  Blood pressure 118/78, pulse 78, temperature 97.4 F (36.3 C), resp. rate 20, height 5\' 4"  (1.626 m), weight 78.5 kg (173 lb 1 oz), last menstrual period 01/10/2011, SpO2 99.00%.    I have examined the patient and concur with the resident/fellow/NP/PA exam.    Assessment and Plan    I agree with the resident/fellow/NP/PA care plan.    See the resident / fellow note for further details.    Gwynneth Munson, MD  Hospital Medicine Attending  PID [phone]

## 2011-04-16 NOTE — Interdisciplinary (Signed)
Pt went to GI suite for colonoscopy. Tele aware. Pt VS stable and a&o x 4. Pt not in acute distress.

## 2011-04-16 NOTE — Plan of Care (Signed)
Problem: Tissue Perfusion - Cardiopulmonary, Altered  Goal: Hemodynamic stability  Vitals: 98.7, 114/85, 79, 98% on RA, 19. Pt NSR. Pt stable and resting comfortably in bed.     Problem: Falls - Risk of  Goal: Absence of falls  Call light in reach. Bed in lowest position with brakes locked. Non skid socks on. Hourly rounding performed.     Problem: Pain - Acute  Goal: Communication of presence of pain  Pt stated pain 7 out of 10 in abdomen. Pt given PRN Vicodin which did not relieve pain. Pt given PRN Dilaudid which decreased pain to a 4 out of 10. Pt now resting comfortably in bed at this time.     Problem: Nausea/Vomiting  Goal: Absence of nausea  Pt states no nausea.     Problem: Alteration in Blood Glucose  Goal: Glucose level within specified parameters  HS BS 151. No insulin coverage required. No s/s of hypoglycemia.     Problem: Skin Integrity Impaired: Secondary to pressure ulcer(admission or hospital acquired), cellulitis, incontinence, surgical wound/incision, chronic wound or skin disease  Goal: Absence of infection signs and symptoms  Skin kept clean, dry and intact. Pt ambulates frequently. Pt has active ROM in all extremities.

## 2011-04-16 NOTE — Progress Notes (Signed)
Daily Progress Note:  04/11/2011     Current Hospital Stay:   8 days - Admitted on: 04/08/2011    O/N: Tolerated bowel prep very well    Subjective:  Pt felt better after having Bowel movements. She has abdominal pain in her lower abdomen.    Objective:  Vital Signs:  Temperature:  [97.4 F (36.3 C)-98.8 F (37.1 C)] 97.4 F (36.3 C) (09/05 1706)  Blood pressure (BP): (108-143)/(70-98) 118/78 mmHg (09/05 1706)  Heart Rate:  [65-99] 78  (09/05 1706)  Respirations:  [15-20] 20  (09/05 1706)  Pain Score:  [-] 8 (09/05 1706)  O2 Device:  [-] None (Room air) (09/05 1706)  SpO2:  [97 %-100 %] 99 % (09/05 1706)  Physical Exam:  General: Ill appearing   HEENT: Op clear, anicteric   Neck: Full rom   Lungs: ctab   CV: Tachy, regular   Abdomen: Soft, diffuse ttp but >>RLQ; no rebound or guarding   Extremities: Nl joints without effusions   Neuro: nonfocal    Laboratory data:   Lab Results   Component Value Date    NA 141 04/16/2011    K 3.8 04/16/2011    CL 100 04/16/2011    BICARB 26 04/16/2011    BUN 6 04/16/2011    CREAT 0.45* 04/16/2011    GLU 132* 04/16/2011     9.3 04/16/2011     Lab Results   Component Value Date    WBC 9.6 04/16/2011    HGB 12.2 04/16/2011    HCT 36.0 04/16/2011    PLT 234 04/16/2011     Lab Results   Component Value Date    AST 33* 04/15/2011    ALT 31 04/15/2011    ALK 56 04/15/2011    TBILI 0.2 04/15/2011    TP 6.9 04/15/2011    ALB 3.9 04/15/2011     Procedures/Images:      COLO:  - GIF H180 scope advanced to appearances of cecum, can not confirm as solid impacted stool can not be cleared.   - Normal appearing mucosa in left colon mostly (although stool covering mucosa, once cleared with irrigation, appeared healthy underneath) except splenic flexture area (35 cm-40 cm proximal to anal verge) where there is erythema, also diverticula observed in this area. S/P biopsies.   - Slight rectal erythema, s/p biopsies   - Right colon mucosa appears healthy (see above for extent of exam), s/p biopsies     EGD:   - Normal esophagus except  GE  junction that had erythema and slight nodularity s/p biopsies   (likely from N/V)   - Slight gastric erythema but no ulcers, no old or fresh blood seen. Biopsies taken   - Normal appearing duodenum, s/p biopsies   MR enterography:  Findings:    Adequate distention of the proximal bowel obtained. Distal small bowel was   poorly distended.    The small bowel and proximal large bowel are unremarkable, without evidence of  mural thickening, stenoses, fistulae or focal dilatation. Symmetric mural   enhancement is identified in the small bowel and proximal large bowel, without  evidence of mucosal hyperenhancement or engorgement of the vasa recta.    Within the proximal sigmoid colon, there is apparent hyperenhancement of the   mucosa. No pericolonic inflammatory change is noted.    Mildly prominent right iliac chain and inguinal lymph nodes are noted   measuring up to 9 mm. No other suspicious adenopathy is identified.  Remainder of the visualized solid viscera are unremarkable.      Impression:    1. Hyperenhancement of the mucosa in the proximal sigmoid colon which may be   on the basis of decompression of this segment of bowel versus mucosal   inflammation.  2. Prominent right iliac chain and inguinal lymph nodes. Recommend   correlation with right lower extremity inflammation.    Colonoscopy  04/16/11  Findings: - There were few, focal superficial small ulcers in   the rectum, rectosigmoid junction, distal sigmoid colon and   splenic flexure with normal appearing mucosa in-between   these segments; multiple biopsies are taken.   - 2 mm sessile polyp in the recto-sigmoid junction; removed   by cold excisional biopsy.   - Otherwise normal colon to the terminal ileum. Multiple   random biopsies are taken from terminal ileum, right colon   and left colon and placed in separate jars to evaluate for   the underlying chronic inflammation.           A/P:   Ravenne Ringold is a 45 year old female with hx of DM, HTN  and episode of ischemic colitis in 2011, presenting with RLQ abdominal pain, nausea and what may be melena vs hematochezia. Patient did have ischemic colitis in 2011 and is having similar sx now, but no CT evidence of disease and unlike previous presentations her pain is in the RLQ at this time. High suspicion for NSAIDs as culprit for possible NSAID colitis or enteritis vs ischemia.  Earlier this hospitalization she has had EGD/ Colonoscopy and results are mentioned above. Biopsy did not show any e/o IBD. Her stool is positive for WBC.  Her MR enterography showed Hyperenhancement of the mucosa in the proximal sigmoid colon which may be   on the basis of decompression of this segment of bowel versus mucosal inflammation. Her C diff PCR is neg. She has no growth of organism in her stool culture. Her stool is negative for ova and parasite. Repeat colonoscopy today with completed bowel preparation showed focal superficial small ulcers in the rectum, rectosigmoid junction, distal sigmoid colon and splenic flexure with normal appearing mucosa in-between these segments. 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    Recommendations:  -- Would start her on regular diet now.  -- Pathology pending.   -- Would continue monitor CBC    -- Case  DW attending Dr. Hart [place] and agrees with this plan.      Attending note.  I agree with the fellow's note as written above.     Jeanella Cara, MD  Attending Physician  Gastroenterology  Pager: [phone]

## 2011-04-17 LAB — CBC WITH DIFF, BLOOD
Abs Eosinophils: 0.3 10*3/uL (ref 0.0–0.5)
Abs Lymphs: 3.2 10*3/uL — ABNORMAL HIGH (ref 0.8–3.1)
Abs Monos: 0.4 10*3/uL (ref 0.2–0.8)
Absolute Neutrophil Count: 5.4 10*3/uL (ref 1.6–7.0)
Eosinophils: 3 % (ref 1–7)
Hct: 39.4 % (ref 34.0–45.0)
Hgb: 13.3 gm/dL (ref 11.2–15.7)
Lymphocytes: 34 % (ref 19–53)
MCH: 28.4 pg (ref 26.0–32.0)
MCHC: 33.8 % (ref 32.0–36.0)
MCV: 84.2 um3 (ref 79.0–95.0)
MPV: 9.8 fL (ref 9.4–12.4)
Monocytes: 4 % — ABNORMAL LOW (ref 5–12)
Plt Count: 251 10*3/uL (ref 140–370)
RBC: 4.68 10*6/uL (ref 3.90–5.20)
RDW: 13.2 % (ref 12.0–14.0)
Segs: 58 % (ref 34–71)
WBC: 9.3 10*3/uL (ref 4.0–10.0)

## 2011-04-17 LAB — GFR: GFR: >60 mL/min

## 2011-04-17 LAB — BASIC METABOLIC PANEL, BLOOD
BUN: 4 mg/dL — ABNORMAL LOW (ref 6–20)
Bicarbonate: 25 mmol/L (ref 22–29)
Calcium: 9.8 mg/dL (ref 8.6–10.5)
Chloride: 99 mmol/L (ref 98–107)
Creatinine: 0.45 mg/dL — ABNORMAL LOW (ref 0.51–0.95)
Glucose: 148 mg/dL — ABNORMAL HIGH (ref 70–115)
Potassium: 4 mmol/L (ref 3.5–5.1)
Sodium: 139 mmol/L (ref 136–145)

## 2011-04-17 MED ORDER — ENOXAPARIN SODIUM 30 MG/0.3ML SC SOLN
30.0000 mg | Freq: Every day | SUBCUTANEOUS | Status: DC
Start: 2011-04-17 — End: 2011-04-17

## 2011-04-17 MED ORDER — HYDROMORPHONE HCL 1 MG/ML IJ SOLN
1.0000 mg | INTRAMUSCULAR | Status: DC | PRN
Start: 2011-04-17 — End: 2011-04-19
  Administered 2011-04-17 – 2011-04-19 (×11): 1 mg via INTRAVENOUS
  Filled 2011-04-17 (×11): qty 1

## 2011-04-17 MED ORDER — HEPARIN SODIUM (PORCINE) 10000 UNIT/ML IJ SOLN
5000.0000 [IU] | Freq: Three times a day (TID) | INTRAMUSCULAR | Status: DC
Start: 2011-04-17 — End: 2011-04-19
  Administered 2011-04-18: 5000 [IU] via SUBCUTANEOUS
  Filled 2011-04-17 (×5): qty 0.5

## 2011-04-17 MED ORDER — HEPARIN SODIUM (PORCINE) 10000 UNIT/ML IJ SOLN
5000.0000 [IU] | Freq: Two times a day (BID) | INTRAMUSCULAR | Status: DC
Start: 2011-04-17 — End: 2011-04-17

## 2011-04-17 MED ORDER — NYSTATIN 100000 UNIT VA TABS
1.0000 | ORAL_TABLET | Freq: Every evening | VAGINAL | Status: DC
Start: 2011-04-17 — End: 2011-04-19
  Administered 2011-04-17: 1 via VAGINAL
  Filled 2011-04-17 (×2): qty 1

## 2011-04-17 NOTE — Progress Notes (Addendum)
Medicine Intern Progress Note: 04/15/2011     Current Hospital Stay:   9 days - Admitted on: 04/08/2011    Subjective:  No over night complaints.  Describes sore ness inside abdomen post colonoscopy. Still with RLQ and sub periumbilical pain and bloatadness.     Medications:        . metoclopramide  10 mg TID AC   . polyethylene glycol  17 g Daily   . cetirizine  10 mg Daily   . ampicillin-sulbactam (UNASYN) IVPB  3,000 mg Q6H   . nystatin  1 tablet HS   . docusate sodium  250 mg Daily   . sennosides  10 mL HS   . glucagon  1 mg On Call   . insulin lispro  1-10 Units 4x Daily AC & HS   . DISCONTD: heparin  5,000 Units Q8H   . carvedilol  25 mg BID   . lisinopril  40 mg BID   . amLODIPINE  10 mg Daily   . clonidine  0.3 mg TID   . sodium chloride  3 mL Q8H     IV Meds:        . sodium chloride       PRN Meds:        . promethazine  12.5 mg Q6H PRN   . bisacodyl  10 mg Daily PRN   . HYDROmorphone  1 mg Q2H PRN   . docusate sodium  250 mg BID PRN   . HYDROcodone-acetaminophen  1 tablet Q4H PRN   . HYDROcodone-acetaminophen  2 tablet Q4H PRN   . diphenhydrAMINE  25 mg Q6H PRN   . simethicone  80 mg Q6H PRN   . glucose  4 tablet PRN   . glucose 40%  1 Tube PRN   . dextrose  12.5 g PRN   . glucagon  1 mg Once PRN   . LORazepam  2 mg Q6H PRN   . calcium carbonate  500 mg Q3H PRN   . sodium chloride  3 mL PRN   . sodium chloride   Continuous PRN   . acetaminophen  650 mg Q4H PRN   . ondansetron (ZOFRAN) IVPB  8 mg Q8H PRN   . hydrALAZINE  10 mg Q6H PRN   . zolpidem  5 mg Nightly PRN       Objective:  Vital Signs:  Latest Entry Range (last 24 hours)  Temperature: 98.4 F (36.9 C) Temp  Avg: 98.2 F (36.8 C)  Min: 97.4 F (36.3 C)  Max: 98.4 F (36.9 C)  Blood pressure (BP): 125/85 mmHg BP  Min: 108/70  Max: 128/79  Heart Rate: 90  Pulse  Avg: 80.3   Min: 65   Max: 92   Respirations: 18  Resp  Avg: 18.7   Min: 18   Max: 20   SpO2: 100 % SpO2  Avg: 99.4 %  Min: 98 %  Max: 100 %    Gen: Well appearing, WNWD, NAD  Neuro: A+O  x3, nonfocal  HEENT: OP clear, mucosa moist, PERRL  Lung: CTA bila, no c/w/r  CV: RRR, no m/r/g  Abd: +BS decreased, obese abdomen, tense rectus abdominus  soft, No masses, no HSM, tender Palpation RLQ ,abdomen  Ext: no edema, clubbing, or cyanosis  Skin: ourouric rash back, arms, legs    Lab Results   Component Value Date    NA 139 04/17/2011    K 4.0 04/17/2011    CL 99 04/17/2011  BICARB 25 04/17/2011    BUN 4 04/17/2011    CREAT 0.45 04/17/2011    GLU 148 04/17/2011    [place] 9.8 04/17/2011     Lab Results   Component Value Date    WBC 9.3 04/17/2011    RBC 4.68 04/17/2011    HGB 13.3 04/17/2011    HCT 39.4 04/17/2011    MCV 84.2 04/17/2011    MCHC 33.8 04/17/2011    RDW 13.2 04/17/2011    PLT 251 04/17/2011    PLT 219 05/21/2009    MPV 9.8 04/17/2011     Stool studies   c diff neg   Stool cutlures pending   Stool WBC positive   Colonoscopy   Endoscopic Diagnosis: - Enema prep.   - GIF H180 scope advanced to appearances of cecum,   can not confirm as solid impacted stool can not be cleared.   - Normal appearing mucosa in left colon mostly (although   stool covering mucosa, once cleared with irrigation,   appeared healthy underneath) except splenic flexture area   (35 cm-40 cm proximal to anal verge) where there is   erythema, also diverticula observed in this area. S/P   biopsies.   - Slight rectal erythema, s/p biopsies   - Right colon mucosa appears healthy (see above for extent   of exam), s/p biopsies   - Retroflexion not done as can not hold in air   Recommendations: Follow biopsy results, other   recommendations per in-patient GI consultation note.   Should get complete colonoscopy given small polyp   appearances in left colon, unsatisfactory prep for   colonsocopy today and RLQ pain, this can only be done   once patient can tolerate preparation (in or out patient okay)     EGD   Endoscopic Diagnosis: - Normal esophagus except GE   junction that had erythema and slight nodularity s/p biopsies   (likely from N/V)   - Slight gastric erythema  but no ulcers, no old or fresh blood   seen. Biopsies taken   - Normal appearing duodenum, s/p biopsies   Recommendations: Follow up biopsy results. Other   recommendations per GI inpatient consultation note and   see colonoscopy report.     MR Enterogag 04/12/11   Impression:  1. Hyperenhancement of the mucosa in the proximal sigmoid colon which may be   on the basis of decompression of this segment of bowel versus mucosal   inflammation.  2. Prominent right iliac chain and inguinal lymph nodes. Recommend   correlation with right lower extremity inflammation.    Pathology  A: Stomach, biopsy   -Gastric mucosa with no significant histopathology.   -No Helicobacter organisms identified.   B: Small bowel, biopsy   -Small intestinal mucosa with no significant histopathology.   C: Gastroesophageal junction, biopsy   -Gastroesophageal junction with foveolar hyperplasia and reactive   changes of gastric cardia.   D: Left colon, random, biopsy   -Colonic mucosa with minimal lamina propria fibrosis and no other   significant histopathology.   E: Colon, right, biopsy   -Colonic mucosa with no significant histopathology.   F: Rectum, biopsy   -Rectal mucosa with minimal lamina propria fibrosis and no other   significant histopathology.      9/5/12Endoscopic Diagnosis: - There were few, focal superficial   small ulcers in the rectum, rectosigmoid junction, distal   sigmoid colon and splenic flexure with normal appearing   mucosa in-between these segments; multiple biopsies are  taken.   - 2 mm sessile polyp in the recto-sigmoid junction; removed   by cold excisional biopsy.   - Otherwise normal colon to the terminal ileum. Multiple   random biopsies are taken from terminal ileum, right colon   and left colon and placed in separate jars to evaluate for   the underlying chronic inflammation.      04/16/11- colonoscopy -focal superficial small ulcers in the rectum, rectosigmoid junction, distal sigmoid colon and splenic flexure with  normal appearing mucosa in-between these segments. This could be seconadry to NSIAD vs ischemic colitis.    Rheum  04/11/11 ANA positive 1:40 titer  Anti neutro Cytoplasim neg    Assessment and Care Plan:   Ms. Crow is a 45 year old woman with a Hx of DM, HTN and ischemic colitis, still with  RLQ pain and pressure s/p colonoscopy suggestive of  NSAID induced inflammation  vs. Ischemic colitis pathology results pending, but pt remains to improve gradually.       #Abdominal pain- RLQ pain improving slowly concerning for Ischemic bowel NSAID induce inflammation. Will d/c unasyn given normal WBC, neg stool cultures, afebril.     -advance diet to blands  -appreciate G.I recs   -s/p colonoscopy   -f/u with path  -d/c unasyn  -pain control oral      #Yeast infection -having vaginal itching most likely due to course of antibiotics.   -nystatin suppository (flucon reactive with metro)     #HTN-Continue Home meds - stable   -Clonidine 0.3mg  T.I.D   Amlodipine 10mg    -carvedilol 25mg  B.I.D   -lisinopril 40mg  B.I.D   - PRN hydralazine     #Diabetis   SSI q6 checks   -d/c glipizide     FEN- bland diet  NPO pending procedure    #PPx- heparin     Code Status:   Full Code/fc    Malachy Mood, MD  Internal Medicine PGY-1  Pgr [phone]      ATTENDING PROGRESS NOTE ATTESTATION    Subjective  Patient seen, discussed, examined with team on rounds today, chart, medications, labs reviewed.  Please see resident note for details.      Interval history: Patient continues to improve with less pain and increased appetite. Wants to try to advance diet.     Objective  Blood pressure 149/99, pulse 81, temperature 98.6 F (37 C), resp. rate 18, height 5\' 4"  (1.626 m), weight 78.5 kg (173 lb 1 oz), last menstrual period 01/10/2011, SpO2 97.00%.    I have examined the patient and concur with the resident/fellow/NP/PA exam.    Assessment and Plan    I agree with the resident/fellow/NP/PA care plan.    See the resident / fellow note for further  details.    Gwynneth Munson, MD  Hospital Medicine Attending  PID [phone]

## 2011-04-17 NOTE — Interdisciplinary (Addendum)
Paged and notified MD,pt. states her vaginal itching is coming back,she said shewas given a one time dose for it last friday.she feels itching on eyelids as well, i gave her benadryl  Po as ordered prn at this time.waiting for MD call back.Dr.Bajandas called back and will place new order, and instructed to report to MD if pt. Have increased itching and sob

## 2011-04-17 NOTE — Plan of Care (Signed)
Problem: Nausea/Vomiting  Goal: Absence of nausea  Pt. C/o nausea late this afternoon, given phenergan po as ordered.will re-assess

## 2011-04-17 NOTE — Plan of Care (Signed)
Problem: Tissue Perfusion - Cardiopulmonary, Altered  Goal: Hemodynamic stability  Outcome: Met   Pt on telemetry and continuously monitored. NSR throughout shift.  VSS. Peripheral pulses palpable and strong; no edema present. Pt denies chest pain and shortness of breath. Encouraged pt to notify nurse if these symptoms arise. Will continue to monitor tele, labs, and meds and assess for acute changes that warrant notification of MD.          Problem: Falls - Risk of  Goal: Absence of falls  Outcome: Met  Patient was instructed on hourly rounding and to call for assistance for ambulation or transfers from bed to the chair.  Fall precautions were followed, and detailed discussion of the 6 p's:  Pain, Potty, plan for the day, prevention of falls by calling for assistance and hourly rounding.  Made sure all patient belongings are within reach and to use the call light if there is anything else needed.  Finally, I made sure the postioning of the patient was done every 2 hours as needed to prevent skin breakdown. Bed low and locked, side rails up x 2. Pt has fall risk assessment score of 10, bed alarm on and pt near RN station    Problem: Nausea/Vomiting  Goal: Absence of nausea  Outcome: Met  Pt no longer c/o N/V. Pt c/o abd pain and bloating, pt given prn meds with relief Pt on full liquid diet and tolerating. Pt s/p colonoscpy with ulcers biopsied and polyps removed. Pt continues to have diarrhea. I/o and electrolytes monitored     Problem: Alteration in Blood Glucose  Goal: Glucose level within specified parameters  Outcome: Met  Pt has accuchecks ac/hs. Blood sugar remains WNL, no insulin needed at this time, pt given snacks of jello at HS.  reinforced teaching of what a normal blood sugar should be.   Pt aware of s/sx of hypoglycemia and was reminded to call RN if he feels hungry/dizzy/lightheaded.     Problem: Skin Integrity Impaired: Secondary to pressure ulcer(admission or hospital acquired), cellulitis,  incontinence, surgical wound/incision, chronic wound or skin disease  Goal: Absence of infection signs and symptoms  Outcome: Met  Skin breakdown prevention Interventions initiated for patient. Pt encouraged to reposition prn and kept warm and dry. Circulation and nutrition assessed. Skin assessed q shift and prn. Pt kept clean from any incontinence episodes and barrier cream and cleaner used as needed. Pt encouraged to ambulate with assist as tolerated.  Underpad used to move pt and pillows used to reposition. No open skin nor rash noted, pt says she did have a rash a few days ago but it has resolved. Pt informed to notify RN if she gets a rash again

## 2011-04-17 NOTE — Interdisciplinary (Signed)
Patients nausea resolved after given scheduled and prn anti nausea med.

## 2011-04-17 NOTE — Progress Notes (Signed)
Daily Progress Note:  04/11/2011     Current Hospital Stay:   9 days - Admitted on: 04/08/2011    O/N: Pt had abdominal pain last night    Subjective:   She has abdominal pain in her lower abdomen.  She denied nausea or vomiting.    Objective:  Vital Signs:  Temperature:  [97.4 F (36.3 C)-98.4 F (36.9 C)] 98.3 F (36.8 C) (09/06 0804)  Blood pressure (BP): (108-132)/(70-100) 132/100 mmHg (09/06 0804)  Heart Rate:  [65-109] 109  (09/06 0804)  Respirations:  [18-20] 18  (09/06 0343)  Pain Score:  [-] 8 (09/06 1231)  O2 Device:  [-] None (Room air) (09/06 0804)  SpO2:  [98 %-100 %] 98 % (09/06 0804)  Physical Exam:  General: Ill appearing   HEENT: Op clear, anicteric   Neck: Full rom   Lungs: ctab   CV: Tachy, regular   Abdomen: Soft, tender in epigastric and hypogastric region, no rebound or guarding   Extremities: Nl joints without effusions   Neuro: nonfocal    Laboratory data:   Lab Results   Component Value Date    NA 139 04/17/2011    K 4.0 04/17/2011    CL 99 04/17/2011    BICARB 25 04/17/2011    BUN 4* 04/17/2011    CREAT 0.45* 04/17/2011    GLU 148* 04/17/2011    [place] 9.8 04/17/2011     Lab Results   Component Value Date    WBC 9.3 04/17/2011    HGB 13.3 04/17/2011    HCT 39.4 04/17/2011    PLT 251 04/17/2011     No results found for this basename: AST, ALT, ALK, TBILI, DBILI, TP, ALB     Procedures/Images:      COLO:  - GIF H180 scope advanced to appearances of cecum, can not confirm as solid impacted stool can not be cleared.   - Normal appearing mucosa in left colon mostly (although stool covering mucosa, once cleared with irrigation, appeared healthy underneath) except splenic flexture area (35 cm-40 cm proximal to anal verge) where there is erythema, also diverticula observed in this area. S/P biopsies.   - Slight rectal erythema, s/p biopsies   - Right colon mucosa appears healthy (see above for extent of exam), s/p biopsies     EGD:   - Normal esophagus except GE  junction that had erythema and slight nodularity s/p  biopsies   (likely from N/V)   - Slight gastric erythema but no ulcers, no old or fresh blood seen. Biopsies taken   - Normal appearing duodenum, s/p biopsies   MR enterography:  Findings:    Adequate distention of the proximal bowel obtained. Distal small bowel was   poorly distended.    The small bowel and proximal large bowel are unremarkable, without evidence of  mural thickening, stenoses, fistulae or focal dilatation. Symmetric mural   enhancement is identified in the small bowel and proximal large bowel, without  evidence of mucosal hyperenhancement or engorgement of the vasa recta.    Within the proximal sigmoid colon, there is apparent hyperenhancement of the   mucosa. No pericolonic inflammatory change is noted.    Mildly prominent right iliac chain and inguinal lymph nodes are noted   measuring up to 9 mm. No other suspicious adenopathy is identified.    Remainder of the visualized solid viscera are unremarkable.      Impression:    1. Hyperenhancement of the mucosa in the proximal sigmoid colon  which may be   on the basis of decompression of this segment of bowel versus mucosal   inflammation.  2. Prominent right iliac chain and inguinal lymph nodes. Recommend   correlation with right lower extremity inflammation.      Pathology 04/10/11    A: Stomach, biopsy   -Gastric mucosa with no significant histopathology.   -No Helicobacter organisms identified.   B: Small bowel, biopsy   -Small intestinal mucosa with no significant histopathology.   C: Gastroesophageal junction, biopsy   -Gastroesophageal junction with foveolar hyperplasia and reactive   changes of gastric cardia.   D: Left colon, random, biopsy   -Colonic mucosa with minimal lamina propria fibrosis and no other   significant histopathology.   E: Colon, right, biopsy   -Colonic mucosa with no significant histopathology.   F: Rectum, biopsy   -Rectal mucosa with minimal lamina propria fibrosis and no other   significant histopathology.          Colonoscopy  04/16/11  Findings: - There were few, focal superficial small ulcers in   the rectum, rectosigmoid junction, distal sigmoid colon and   splenic flexure with normal appearing mucosa in-between   these segments; multiple biopsies are taken.   - 2 mm sessile polyp in the recto-sigmoid junction; removed   by cold excisional biopsy.   - Otherwise normal colon to the terminal ileum. Multiple   random biopsies are taken from terminal ileum, right colon   and left colon and placed in separate jars to evaluate for   the underlying chronic inflammation.           A/P:   Katherine Church is a 45 year old female with hx of DM, HTN and episode of ischemic colitis in 2011, presenting with RLQ abdominal pain, nausea and what may be melena vs hematochezia. Patient did have ischemic colitis in 2011 and is having similar sx now, but no CT evidence of disease and unlike previous presentations her pain is in the RLQ at this time. High suspicion for NSAIDs as culprit for possible NSAID colitis or enteritis vs ischemia.  Earlier this hospitalization she has had EGD/ Colonoscopy and results are mentioned above. Biopsy did not show any e/o IBD. Her stool is positive for WBC.  Her MR enterography showed Hyperenhancement of the mucosa in the proximal sigmoid colon which may be on the basis of decompression of this segment of bowel versus mucosal inflammation. Her C diff PCR is neg. She has no growth of organism in her stool culture. Her stool is negative for ova and parasite. Repeat colonoscopy 04/16/11 with completed bowel preparation showed focal superficial small ulcers in the rectum, rectosigmoid junction, distal sigmoid colon and splenic flexure with normal appearing mucosa in-between these segments. 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    Recommendations:  -- Would start her on regular diet now.  -- Pathology pending and  would follow up.   -- avoid NSAIDS.      Case dw. Dr. Hart Rochester and he agrees with above plan.  Will sign off now and please call if you have questions    Attending note:   I agree with the progress note as written above by the fellow.     Jeanella Cara, MD  Attending Physician  Gastroenterology  Pager: [phone]

## 2011-04-17 NOTE — Plan of Care (Signed)
Problem: Tissue Perfusion - Cardiopulmonary, Altered  Goal: Hemodynamic stability  SR/ST on tele, denies chest pain/pressure,K+ 4.0, no ectopies noted on tele    Problem: Falls - Risk of  Goal: Absence of falls  Fall precautions observed, call light within reach, bed locked and in lowest position, no falls or injury noted    Problem: Pain - Acute  Goal: Communication of presence of pain  Pt. Have chronic abdominal spasms/pain on abdomen, medicated with dilaudid 1mg  iv and vicodin 2 tab po for 8/10 pain, pt. Gets moderate relief 5/10 alternating dilaudid and vicodin.will continue to monitor pt.    Problem: Nausea/Vomiting  Goal: Absence of nausea  Denies any nausea at this time, pt's diet increased to low residue(bland diet) will assess if pt. Tolerates diet    Problem: Alteration in Blood Glucose  Goal: Glucose level within specified parameters  Blood sugar checks q ac and hs, insulin sliding scale ordered, insulin coverage not required for bs 122 at lunch.will continue with diabetic management

## 2011-04-18 LAB — GLUCOSE POCT, BLOOD
Meter Glucose (POC): 151 mg/dL — ABNORMAL HIGH (ref 70–115)
Meter Glucose (POC): 150 mg/dL — ABNORMAL HIGH (ref 70–115)
Meter Glucose (POC): 138 mg/dL — ABNORMAL HIGH (ref 70–115)
Meter Glucose (POC): 128 mg/dL — ABNORMAL HIGH (ref 70–115)
Meter Glucose (POC): 155 mg/dL — ABNORMAL HIGH (ref 70–115)
Meter Glucose (POC): 95 mg/dL (ref 70–115)
Meter Glucose (POC): 122 mg/dL — ABNORMAL HIGH (ref 70–115)
Meter Glucose (POC): 158 mg/dL — ABNORMAL HIGH (ref 70–115)
Meter Glucose (POC): 165 mg/dL — ABNORMAL HIGH (ref 70–115)

## 2011-04-18 LAB — CBC WITH DIFF, BLOOD
Abs Eosinophils: 0.2 10*3/uL (ref 0.0–0.5)
Abs Lymphs: 3.7 10*3/uL — ABNORMAL HIGH (ref 0.8–3.1)
Abs Monos: 0.4 10*3/uL (ref 0.2–0.8)
Absolute Neutrophil Count: 5 10*3/uL (ref 1.6–7.0)
Eosinophils: 2 % (ref 1–7)
Hct: 36.9 % (ref 34.0–45.0)
Hgb: 12.5 gm/dL (ref 11.2–15.7)
Lymphocytes: 39 % (ref 19–53)
MCH: 28.5 pg (ref 26.0–32.0)
MCHC: 33.9 % (ref 32.0–36.0)
MCV: 84.1 um3 (ref 79.0–95.0)
MPV: 9.9 fL (ref 9.4–12.4)
Monocytes: 5 % (ref 5–12)
Plt Count: 263 10*3/uL (ref 140–370)
RBC: 4.39 10*6/uL (ref 3.90–5.20)
RDW: 13.2 % (ref 12.0–14.0)
Segs: 53 % (ref 34–71)
WBC: 9.4 10*3/uL (ref 4.0–10.0)

## 2011-04-18 LAB — BASIC METABOLIC PANEL, BLOOD
BUN: 6 mg/dL (ref 6–20)
Bicarbonate: 26 mmol/L (ref 22–29)
Calcium: 9.6 mg/dL (ref 8.6–10.5)
Chloride: 100 mmol/L (ref 98–107)
Creatinine: 0.48 mg/dL — ABNORMAL LOW (ref 0.51–0.95)
Glucose: 130 mg/dL — ABNORMAL HIGH (ref 70–115)
Potassium: 4.2 mmol/L (ref 3.5–5.1)
Sodium: 139 mmol/L (ref 136–145)

## 2011-04-18 LAB — GFR: GFR: >60 mL/min

## 2011-04-18 MED ORDER — OXYCODONE-ACETAMINOPHEN 5-325 MG OR TABS
1.0000 | ORAL_TABLET | ORAL | Status: DC | PRN
Start: 2011-04-18 — End: 2011-04-19

## 2011-04-18 MED ORDER — OXYCODONE-ACETAMINOPHEN 5-325 MG OR TABS
2.0000 | ORAL_TABLET | ORAL | Status: DC | PRN
Start: 2011-04-18 — End: 2011-04-19
  Administered 2011-04-18 – 2011-04-19 (×5): 2 via ORAL
  Filled 2011-04-18 (×5): qty 2

## 2011-04-18 MED ORDER — HYDROMORPHONE HCL 2 MG/ML IJ SOLN
2.00 mg | Freq: Once | INTRAMUSCULAR | Status: AC
Start: 2011-04-18 — End: 2011-04-18

## 2011-04-18 NOTE — Plan of Care (Signed)
Problem: Tissue Perfusion - Cardiopulmonary, Altered  Goal: Hemodynamic stability  Outcome: Met  Chest pain assess.tele on sinus rhythm. No ectopy.stable vital signs.

## 2011-04-18 NOTE — Progress Notes (Addendum)
Medicine Intern Progress Note: 04/18/2011     Current Hospital Stay:    - Admitted on: 04/08/2011    Subjective:     Still with RLQ radiating to rt shoulder. No N/V/D SOB     Medications:        . metoclopramide  10 mg TID AC   . polyethylene glycol  17 g Daily   . cetirizine  10 mg Daily   . ampicillin-sulbactam (UNASYN) IVPB  3,000 mg Q6H   . nystatin  1 tablet HS   . docusate sodium  250 mg Daily   . sennosides  10 mL HS   . glucagon  1 mg On Call   . insulin lispro  1-10 Units 4x Daily AC & HS   . DISCONTD: heparin  5,000 Units Q8H   . carvedilol  25 mg BID   . lisinopril  40 mg BID   . amLODIPINE  10 mg Daily   . clonidine  0.3 mg TID   . sodium chloride  3 mL Q8H     IV Meds:        . sodium chloride       PRN Meds:        . promethazine  12.5 mg Q6H PRN   . bisacodyl  10 mg Daily PRN   . HYDROmorphone  1 mg Q2H PRN   . docusate sodium  250 mg BID PRN   . HYDROcodone-acetaminophen  1 tablet Q4H PRN   . HYDROcodone-acetaminophen  2 tablet Q4H PRN   . diphenhydrAMINE  25 mg Q6H PRN   . simethicone  80 mg Q6H PRN   . glucose  4 tablet PRN   . glucose 40%  1 Tube PRN   . dextrose  12.5 g PRN   . glucagon  1 mg Once PRN   . LORazepam  2 mg Q6H PRN   . calcium carbonate  500 mg Q3H PRN   . sodium chloride  3 mL PRN   . sodium chloride   Continuous PRN   . acetaminophen  650 mg Q4H PRN   . ondansetron (ZOFRAN) IVPB  8 mg Q8H PRN   . hydrALAZINE  10 mg Q6H PRN   . zolpidem  5 mg Nightly PRN       Objective:  Vital Signs:  Latest Entry Range (last 24 hours)  Temperature: 98.4 F (36.9 C) Temp  Avg: 98.2 F (36.8 C)  Min: 97.4 F (36.3 C)  Max: 98.4 F (36.9 C)  Blood pressure (BP): 125/85 mmHg BP  Min: 108/70  Max: 128/79  Heart Rate: 90  Pulse  Avg: 80.3   Min: 65   Max: 92   Respirations: 18  Resp  Avg: 18.7   Min: 18   Max: 20   SpO2: 100 % SpO2  Avg: 99.4 %  Min: 98 %  Max: 100 %    Gen: Well appearing, WNWD, NAD  Neuro: A+O x3, nonfocal  HEENT: OP clear, mucosa moist, PERRL  Lung: CTA bila, no c/w/r  CV: RRR,  no m/r/g  Abd: +BS decreased, obese abdomen, tense rectus abdominus  soft, No masses, no HSM, tender Palpation RLQ ,abdomen  Ext: no edema, clubbing, or cyanosis  Skin: ourouric rash back, arms, legs    Lab Results   Component Value Date    NA 139 04/17/2011    K 4.0 04/17/2011    CL 99 04/17/2011    BICARB 25 04/17/2011  BUN 4 04/17/2011    CREAT 0.45 04/17/2011    GLU 148 04/17/2011    [place] 9.8 04/17/2011     Lab Results   Component Value Date    WBC 9.3 04/17/2011    RBC 4.68 04/17/2011    HGB 13.3 04/17/2011    HCT 39.4 04/17/2011    MCV 84.2 04/17/2011    MCHC 33.8 04/17/2011    RDW 13.2 04/17/2011    PLT 251 04/17/2011    PLT 219 05/21/2009    MPV 9.8 04/17/2011   Rheum  04/11/11 ANA positive 1:40 titer  Anti neutro Cytoplasim neg    Stool studies   c diff neg   Stool cutlures pending   Stool WBC positive   Colonoscopy   Endoscopic Diagnosis: - Enema prep.   - GIF H180 scope advanced to appearances of cecum,   can not confirm as solid impacted stool can not be cleared.   - Normal appearing mucosa in left colon mostly (although   stool covering mucosa, once cleared with irrigation,   appeared healthy underneath) except splenic flexture area   (35 cm-40 cm proximal to anal verge) where there is   erythema, also diverticula observed in this area. S/P   biopsies.   - Slight rectal erythema, s/p biopsies   - Right colon mucosa appears healthy (see above for extent   of exam), s/p biopsies   - Retroflexion not done as can not hold in air   Recommendations: Follow biopsy results, other   recommendations per in-patient GI consultation note.   Should get complete colonoscopy given small polyp   appearances in left colon, unsatisfactory prep for   colonsocopy today and RLQ pain, this can only be done   once patient can tolerate preparation (in or out patient okay)     EGD   Endoscopic Diagnosis: - Normal esophagus except GE   junction that had erythema and slight nodularity s/p biopsies   (likely from N/V)   - Slight gastric erythema but no ulcers, no  old or fresh blood   seen. Biopsies taken   - Normal appearing duodenum, s/p biopsies   Recommendations: Follow up biopsy results. Other   recommendations per GI inpatient consultation note and   see colonoscopy report.     MR Enterogag 04/12/11   Impression:  1. Hyperenhancement of the mucosa in the proximal sigmoid colon which may be   on the basis of decompression of this segment of bowel versus mucosal   inflammation.  2. Prominent right iliac chain and inguinal lymph nodes. Recommend   correlation with right lower extremity inflammation.    Pathology  A: Stomach, biopsy   -Gastric mucosa with no significant histopathology.   -No Helicobacter organisms identified.   B: Small bowel, biopsy   -Small intestinal mucosa with no significant histopathology.   C: Gastroesophageal junction, biopsy   -Gastroesophageal junction with foveolar hyperplasia and reactive   changes of gastric cardia.   D: Left colon, random, biopsy   -Colonic mucosa with minimal lamina propria fibrosis and no other   significant histopathology.   E: Colon, right, biopsy   -Colonic mucosa with no significant histopathology.   F: Rectum, biopsy   -Rectal mucosa with minimal lamina propria fibrosis and no other   significant histopathology.      9/5/12Endoscopic Diagnosis: - There were few, focal superficial   small ulcers in the rectum, rectosigmoid junction, distal   sigmoid colon and splenic flexure with normal appearing  mucosa in-between these segments; multiple biopsies are   taken.   - 2 mm sessile polyp in the recto-sigmoid junction; removed   by cold excisional biopsy.   - Otherwise normal colon to the terminal ileum. Multiple   random biopsies are taken from terminal ileum, right colon   and left colon and placed in separate jars to evaluate for   the underlying chronic inflammation.      04/16/11- colonoscopy -focal superficial small ulcers in the rectum, rectosigmoid junction, distal sigmoid colon and splenic flexure with normal appearing  mucosa in-between these segments. This could be seconadry to NSIAD vs ischemic colitis.    04/18/11 US abdomen  IMPRESSION:  Mild hepatomegaly, otherwise, no abnormalities noted.        Assessment and Care Plan:   Katherine Church is a 45 year old woman with a Hx of DM, HTN and ischemic colitis, still with  RLQ pain now radiating to rt shoulder   s/p colonoscopy sshowing small ulcers in left colon pathology results pending, still concerning for NSAID induced inflammation  vs. Ischemic colitis.       #Abdominal pain- RLQ pain improving slowly concerning for Ischemic bowel NSAID induce inflammation. Will d/c unasyn given normal WBC, neg stool cultures, afebrile.     -advance diet to blands  -appreciate G.I recs   -s/p colonoscopy   -f/u with path  -d/c unasyn  -pain control oral      #Yeast infection -having vaginal itching most likely due to course of antibiotics.   -nystatin suppository (flucon reactive with metro)     #HTN-Continue Home meds - stable   -Clonidine 0.3mg  T.I.D   Amlodipine 10mg    -carvedilol 25mg  B.I.D   -lisinopril 40mg  B.I.D   - PRN hydralazine     #Diabetis   SSI q6 checks   -d/c glipizide     FEN- bland diet  blands    #PPx- heparin     Code Status:   Full Code/fc    Katherine Mood, MD  Internal Medicine PGY-1  Pgr [phone]      ATTENDING PROGRESS NOTE ATTESTATION    Subjective  Patient seen, discussed, examined with team on rounds today, chart, medications, labs reviewed.  Please see resident note for details.      Interval history: Patient with worsening abdominal pain after ultrasound, and mild increased nausea.     Objective  Blood pressure 130/82, pulse 75, temperature 98.9 F (37.2 C), resp. rate 20, height 5\' 4"  (1.626 m), weight 78.5 kg (173 lb 1 oz), last menstrual period 01/10/2011, SpO2 98.00%.    I have examined the patient and concur with the resident/fellow/NP/PA exam.    Assessment and Plan    I agree with the resident/fellow/NP/PA care plan.    See the resident / fellow note for further  details.    Katherine Munson, MD  Hospital Medicine Attending  PID [phone]

## 2011-04-18 NOTE — Plan of Care (Signed)
Problem: Nausea/Vomiting  Goal: Absence of nausea  Outcome: Met  1515 dulcolax sup.given per req, for complained of constipation with pending results.

## 2011-04-18 NOTE — Plan of Care (Signed)
Problem: Nausea/Vomiting  Goal: Absence of nausea  Outcome: Met  Remains nauseated,reglan 10 mg.po given pre meal as ordered. Refused to take zofran iv.phenergan prn as ordered. Aware to call if needed. No vomiting noted as of now. Will continue to monitor.

## 2011-04-18 NOTE — Plan of Care (Signed)
Problem: Pain - Acute  Goal: Communication of presence of pain  Outcome: Met  1100 dilaudid 1 mg iv given per doctor's order,pain scale is 8/10 abdominal pain. reassess after 1 hr.pain scale is down to 6/10.docotr is aware of the pain scale,said just give pain med if it's due. U/S of the abdomen done this morning with pending results.

## 2011-04-18 NOTE — Interdisciplinary (Cosign Needed)
Patient: Amirykal Lardie    PMH:HTN,DM    Diagnoses of : ACUTE ISCHEMIC COLITIS  (557.0B)     Current Diet Order: Parke Simmers     Height: 5\' 4"  (162.6 cm)   Weight - scale: 78.5 kg (173 lb 1 oz)       Skin Integrity:WNL per head to toe assessment    Labs:Glucose 130/ albumen 3.9 g/dL (03/12/94    Nutritionally relevant meds:Glucagon    Ideal Body Weight: 120 pounds 173% of ideal body weight     Body mass index is 29.71 kg/(m^2).    Weight Change: n/a    UBW:174 pounds per patient     Level Of Care:  2      Chewing Difficulty: None    Swallowing Difficulty: None    Nausea/Vomit/Diarrhea: None    Appetite/Documented/reported: 75% pt stated eating well    Allergies/Intolerances:  None per patient     Education needs identified or requested:None    Will provide snacks as approved per Registered Dietitian.    Will provide basic nutrition services and reclassify nutrition status per policy.    Note Author: Westley Gambles Dietetic Technician Registered

## 2011-04-18 NOTE — Plan of Care (Signed)
Problem: Skin Integrity Impaired: Secondary to pressure ulcer(admission or hospital acquired), cellulitis, incontinence, surgical wound/incision, chronic wound or skin disease  Goal: Absence of infection signs and symptoms  Outcome: Met  No skin issues observe. Movable in bed. Ambulatory.

## 2011-04-18 NOTE — Plan of Care (Signed)
Problem: Falls - Risk of  Goal: Absence of falls  Outcome: Met  No falls. Ambulatory in the room ad lib, tolerated well.gait steady.

## 2011-04-18 NOTE — Plan of Care (Signed)
Problem: Tissue Perfusion - Cardiopulmonary, Altered  Goal: Hemodynamic stability  Alert and oriented x3, no sob, no distress, O2 sat at 98% at RA, bp at 125/78, sr at 85, voiding well, urine output within normal range, no ekg changes, no verbalized chest pain all night.    Problem: Falls - Risk of  Goal: Absence of falls  Fall precautions observed, call light within reach, bed locked and in lowest position, no falls or injury noted.             Problem: Discharge Planning  Goal: Participation in care planning  Discussed plan of care with understanding, with supportive family, patient will probably be discharged back to home in the future.    Problem: Pain - Acute  Goal: Communication of presence of pain  Patient is constantly with back pain, medicated with alternating pain medications, pls see mar, with relief for few hours, repositioned for comfort.    Problem: Nausea/Vomiting  Goal: Absence of nausea  No nausea, and no vomiting noted all night.    Problem: Alteration in Blood Glucose  Goal: Glucose level within specified parameters  Blood sugar check q ac, and hs, no insulin coverage given, pls see mar, no s/s of hypo/hyperglycemia noted all night.

## 2011-04-18 NOTE — Plan of Care (Signed)
Problem: Skin Integrity Impaired: Secondary to pressure ulcer(admission or hospital acquired), cellulitis, incontinence, surgical wound/incision, chronic wound or skin disease  Goal: Absence of infection signs and symptoms  Skin care done, assisted.  Able to turn self while in bed and ambulatory occasionally to the bathroom, All pressure areas padded with pillow, heels off, no skin breakdown noted this shift.

## 2011-04-18 NOTE — Procedures (Signed)
FINAL PATHOLOGIC DIAGNOSIS:  A:  Terminal ileum, biopsy       -Benign small intestinal mucosa with no specific pathologic  abnormality.  B:  Right colon, biopsy       -Benign colonic mucosa with mild architectural alterations.  C:  Splenic flexure of colon, lesions, biopsy       -Benign colonic mucosa with mild architectural alterations.  D:  Random left colon, biopsy       -Benign colonic mucosa with no significant pathologic abnormality.  E:  Rectosigmoid lesions, biopsy       -Benign colonic mucosa with focal mild lamina propria fibrosis.  F:  Rectosigmoid polyps, biopsy       -Tubular adenoma.  G:  Rectal lesion, biopsy       -Focal ulceration with reparative changes.     SPECIMEN(S) SUBMITTED:  A: terminal ileum biopsy  B: right colon biopsy  C: splenic flexure ulcers  D: random left colon biopsy  E: rectosigmoid ulcers  F: rectosigmoid polyps  G: rectal ulcers  CLINICAL HISTORY:  Patient with history of ischemic colitis. Has multiple  ulcers in few segments of colon. Rule out ischemia, NSAID-induced,  infectious or inflammatory bowel disease.     GROSS DESCRIPTION:  A: The specimen (received in formalin, labeled with the patient's name,  medical record number and "terminal ileum bxs") consists of three irregular  fragments of tan soft tissue measuring 0.3 to 0.4 cm in greatest dimension.  The specimen is entirely submitted in cassette A1.  B: The specimen (received in formalin, labeled with the patient's name,  medical record number and "right colon bx") consists of seven irregular  fragments of tan soft tissue measuring 0.3 to 0.6 cm in greatest dimension.  The specimen is entirely submitted in cassette B1.  C: The specimen (received in formalin, labeled with the patient's name,  medical record number and "splenic flexure ulcer bx") consists of four  irregular fragments of tan soft tissue measuring 0.3 to 0.4 cm in greatest  dimension. The specimen is entirely submitted in cassette C1.  D: The specimen  (received in formalin, labeled with the patient's name,  medical record number and "random L colon") consists of three irregular  fragments of tan soft tissue measuring 0.2 to 0.3 cm in greatest dimension.  The specimen is entirely submitted in cassette D1.  E: The specimen (received in formalin, labeled with the patient's name,  medical record number and "rectosigmoid ulcers") consists of six irregular  fragments of tan soft tissue measuring 0.2 to 0.5 cm in greatest dimension.  The specimen is entirely submitted in cassette E1.  F: The specimen (received in formalin, labeled with the patient's name,  medical record number and "rectosigmoid polyp") consists of an irregular  fragment of tan soft tissue measuring 0.6 x 0.2 x 0.2 cm. The specimen is  entirely submitted in cassette F1.  G: The specimen (received in formalin, labeled with the patient's name,  medical record number and "rectal ulcers") consists of two irregular  fragments of tan soft tissue measuring 0.2 and 0.3 cm.. The specimen is  entirely submitted in cassette G1.  AY/PS/jb  CONFIDENTIAL HEALTH INFORMATION: Health Care information is personal and  sensitive information. If it is being faxed to you it is done so under  appropriate authorization from the patient or under circumstances that do  not require patient authorization. You, the recipient, are obligated to  maintain it in a safe, secure and confidential manner. Re-disclosure  without additional  patient consent or as permitted by law is prohibited.  Unauthorized re-disclosure or failure to maintain confidentiality could  subject you to penalties described in federal and state law.  If you have  received this report or facsimile in error, please notify the [facility] immediately and destroy the received document(s).   Material reviewed and Interpreted and  Report Electronically Signed by:  Ruffin Frederick. Ponsford Tipps M.D. [id number])  Attending Surgical Pathologist  04/18/11  19:23  Electronic Signature derived from a single  controlled access password

## 2011-04-18 NOTE — Plan of Care (Signed)
Problem: Alteration in Blood Glucose  Goal: Glucose level within specified parameters  Outcome: Met  Lunch time blood sugar= 138. No sliding scale noted.

## 2011-04-19 LAB — CBC WITH DIFF, BLOOD
Abs Eosinophils: 0.3 10*3/uL (ref 0.0–0.5)
Abs Lymphs: 3.1 10*3/uL (ref 0.8–3.1)
Abs Monos: 0.5 10*3/uL (ref 0.2–0.8)
Absolute Neutrophil Count: 4.7 10*3/uL (ref 1.6–7.0)
Eosinophils: 4 % (ref 1–7)
Hct: 37.4 % (ref 34.0–45.0)
Hgb: 12.7 gm/dL (ref 11.2–15.7)
Lymphocytes: 36 % (ref 19–53)
MCH: 28.7 pg (ref 26.0–32.0)
MCHC: 34 % (ref 32.0–36.0)
MCV: 84.6 um3 (ref 79.0–95.0)
MPV: 9.9 fL (ref 9.4–12.4)
Monocytes: 6 % (ref 5–12)
Plt Count: 262 10*3/uL (ref 140–370)
RBC: 4.42 10*6/uL (ref 3.90–5.20)
RDW: 13.2 % (ref 12.0–14.0)
Segs: 54 % (ref 34–71)
WBC: 8.7 10*3/uL (ref 4.0–10.0)

## 2011-04-19 LAB — GFR: GFR: >60 mL/min

## 2011-04-19 LAB — BASIC METABOLIC PANEL, BLOOD
BUN: 8 mg/dL (ref 6–20)
Bicarbonate: 26 mmol/L (ref 22–29)
Calcium: 9.6 mg/dL (ref 8.6–10.5)
Chloride: 99 mmol/L (ref 98–107)
Creatinine: 0.5 mg/dL — ABNORMAL LOW (ref 0.51–0.95)
Glucose: 142 mg/dL — ABNORMAL HIGH (ref 70–115)
Potassium: 4.2 mmol/L (ref 3.5–5.1)
Sodium: 138 mmol/L (ref 136–145)

## 2011-04-19 MED ORDER — LORAZEPAM 1 MG OR TABS
1.0000 mg | ORAL_TABLET | Freq: Four times a day (QID) | ORAL | Status: DC | PRN
Start: 2011-04-19 — End: 2011-04-23

## 2011-04-19 MED ORDER — CARVEDILOL 25 MG OR TABS
25.0000 mg | ORAL_TABLET | Freq: Two times a day (BID) | ORAL | Status: DC
Start: 2011-04-19 — End: 2012-10-10

## 2011-04-19 MED ORDER — OXYCODONE-ACETAMINOPHEN 5-325 MG OR TABS
1.0000 | ORAL_TABLET | Freq: Four times a day (QID) | ORAL | Status: DC | PRN
Start: 2011-04-19 — End: 2011-05-06

## 2011-04-19 MED ORDER — LORAZEPAM 1 MG OR TABS
1.0000 mg | ORAL_TABLET | Freq: Four times a day (QID) | ORAL | Status: DC | PRN
Start: 2011-04-19 — End: 2011-04-19
  Filled 2011-04-19: qty 1

## 2011-04-19 NOTE — Plan of Care (Signed)
Problem: Tissue Perfusion - Cardiopulmonary, Altered  Goal: Hemodynamic stability  SR in 80's on tele monitor. Denied CP or SOB. No edema noted. Peripheral pulses palpable. Pt is breathing in RA, O2Sat > 98%. Pt has been afebrile during this shift. K+ 4.2. H/H 12.7/37.4. Assessed and monitored pt.     Problem: Falls - Risk of  Goal: Absence of falls  Outcome: Met  Pt remained free of fall/injuries. Pt is ambulatory with steady gait and independent. Hourly rounding performed. Addressing 6P's: prevention of fall, plan of care, pain management, positioning, proximity, and potty. Instructed to use call bell to call for assistance and pt is able to use call bell to call for assist. Bed in low position, bed wheels locked, rails up 2/4, call bell, phone and personal belonging within pt's reach. Pt is wearing non-skid socks. Room is clean and clear to promote safe environment. Will continue to monitor pt.       Problem: Discharge Planning  Goal: Participation in care planning  Outcome: Met  No vaccine indicated. Removed peripheral IV access. Went over discharge instruction. Went over medication list. Educated pt on diabetes and reminded pt to check blood sugar regularly and take med per order. Educated pt on s/s of when to call 911. Reminded pt not to drive when taking narcotic. Reminded pt of follow up appointment with GI on 04/23/11 with Dr. Anselm Jungling. Pt signed all paper works. Answered all of pt questions. Pt verbalized understanding and did teach back well. Pt left the floor at 1450 on wheel chair in fair condition. Pt took all of her personal belongings.     Problem: Pain - Acute  Goal: Communication of presence of pain  0840: Pt communicated of pain 8/10 spasm on the RLQ of abd, Dilaudid IV given and reassessed pain 1 hours after and pt state that pain was gone. At 1100, pt reported of pain on the RLQ of abd again and Percocet was given. Pt stated that it reduced pain when reassessed. Assessed and managed pt's pain per  order.     Problem: Nausea/Vomiting  Goal: Absence of nausea  Outcome: Met  Pt denied nausea and vomiting today.     Problem: Alteration in Blood Glucose  Goal: Glucose level within specified parameters  Monitored blood glucose AC/HS. Before lunch pt's blood glucose was 136 and no insulin coverage per correctional scale needed. Educated pt on diabetes and encouraged pt to check blood sugar regularly and take meds as ordered. Pt verbalized understanding. Diabetes handbook given.    Problem: Skin Integrity Impaired: Secondary to pressure ulcer(admission or hospital acquired), cellulitis, incontinence, surgical wound/incision, chronic wound or skin disease  Goal: Absence of infection signs and symptoms  Outcome: Met  Assessed pt's skin. Skin intact. Pt is independent and ambulatory. Encouraged pt to perform hygiene and oral care.     Problem: Constipation  Goal: Bowel elimination without discomfort  Reference [facility] medical center Assessment/reassessment standard of care.   Outcome: Met  In the morning, pt reported of constipation. Meds were given and encouraged pt to ambulate more frequently. Around 1420, pt had BM.

## 2011-04-19 NOTE — Progress Notes (Signed)
DAILY PROGRESS NOTE      Current Hospital LOS: 11 days     Subjective/Interval History:  Patient doing well with persistent pain after eating and trying to have bowel movement but wants to try to go home.  Denies any other acute complaints.        Objective:    Vital Signs:   Temperature:  [98.3 F (36.8 C)-98.9 F (37.2 C)] 98.6 F (37 C) (09/08 0400)  Blood pressure (BP): (113-130)/(74-94) 128/94 mmHg (09/08 0849)  Heart Rate:  [72-88] 87  (09/08 0849)  Respirations:  [18-20] 20  (09/08 0400)  Pain Score:  [-] 7 (09/08 0846)  O2 Device:  [-] None (Room air) (09/08 0400)  SpO2:  [97 %-99 %] 99 % (09/08 0400)    Bedside Glucose (mg/dL)  Avg: 952.8   Min: 79   Max: 138   Wt Readings from Last 1 Encounters:   04/08/11 78.5 kg (173 lb 1 oz)     09/07 0600 - 09/08 0559  In: 1500 [P.O.:1500]  Out: -     Physical Exam:  Gen: a&Ox3 no acute respiratory distress  Lungs: cTAB  CV: RR w/o M/r/G nl s1 s2  Abd: soft, persistent mild RLQ pain  Ext: no edema    Laboratory data:   Lab Results   Component Value Date    NA 139 04/18/2011    K 4.2 04/18/2011    CL 100 04/18/2011    BICARB 26 04/18/2011    BUN 6 04/18/2011    CREAT 0.48* 04/18/2011    GLU 130* 04/18/2011    [place] 9.6 04/18/2011     Lab Results   Component Value Date    WBC 9.4 04/18/2011    HGB 12.5 04/18/2011    HCT 36.9 04/18/2011    PLT 263 04/18/2011    SEG 53 04/18/2011    LYMPHS 39 04/18/2011    MONOS 5 04/18/2011    EOS 2 04/18/2011     No results found for this basename: AST, ALT, ALK, TBILI, DBILI, TP, ALB     No results found for this basename: INR, PTT       Assessment and Plan:  45 yo F with history of ischemic colitis who presents with increased abdominal pain concerning for possible recurrent ischemic, infectious, or inflammatory colitis 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      1. Abdominal pain: Most consistent with NSAID colopathy, no evidence of ischemic colitis on biopsies, no GB disease on ultrasound done yesterday.   -Appreciate GI evaluation/help will need follow up with GI  post discharge  -tolerating bland diet  -continue pain medications - percocet for discharge with good bowel regimen for constipation  -will dc reglan  -continue miralax/senna, colace for home    2. HTN: Continue hypertension meds amlodipine, carvedilol, clonidine, lisinopril    3. DM: Continue ISS with QAC/HS accuchecks. Will restart glipizide on discharge.     4.  Dispo;  Discharge home, follow up with primary MD, GI.     Gwynneth Munson, MD  Hospital Medicine Attending  PID [phone]

## 2011-04-19 NOTE — Discharge Instructions (Addendum)
Diagnosis and Reason for Admission    You were admitted to the hospital for the following reason(s):  Abdominal pain     Your full diagnosis list is located on this After Visit Summary in the Hospital Problems section.    What Happened During Your Hospital Stay    The main tests and treatments done for you during this hospitalization were:    - EGD and colonoscopy    The following evaluation is still important to complete after discharge from the hospital:  - Please follow up in GI clinic     Instructions for After Discharge    Your diet at home should be a regular diet.    Your activity level at home should be:  regular activity.    Specific activity restrictions:    Do not drive while taking narcotic pain medications.    Wound or tube care instructions:  None    Your medication list is located on this After Visit Summary in the Current Discharge Medication List section.  Your nurse will review this information with you before you leave the hospital.    It is very important for you to keep a current medication list with you in order to assist your doctors with your medical care.  Bring this After Visit Summary with you to your follow up appointments.    Reasons to Contact a Doctor Urgently    Call 911 or return to the hospital immediately if:  Chest pain/ shortness of breath    You should contact either your primary care physician or your hospital physician for any of the following reasons: Recurrence of bleeding and abdominal pain    If you have any questions about your hospital care, your medications, or if you have new or concerning symptoms soon after going home from the hospital, and you need to contact your hospital physician, your hospital physician can be contacted in the following manner:  [facility] operator at [phone].    Once you are able to see your primary care physician (PCP), your PCP will then be responsible for further medication refills, or appointment referrals.    What Needs to  Happen Next After Discharge -- Appointments and Follow Up    Any appointments already scheduled at  clinics will be listed in the Future Appointments section at the top of this After Visit Summary.  Any appointments that have been requested, but have not yet been scheduled, will be listed below that under Post Discharge Referrals.    Sometimes tests performed in the hospital do not yet have results by the time a patient goes home.  The following key tests will need to be followed up at your next appointment: None    Additional Information for You from your Case Manager and/or Social Worker (if applicable)        Additional Information for You from your Inpatient Therapists (if applicable)        Additional Information for You from your Discharging Nurse (if applicable)    Additional Instructions for Patients with Diabetes    Hypoglycemia (low blood sugar): means that your blood sugar may be less than 70 mg/dL.  You might feel any of the the following symptoms:  - Dizzy, shaky, or feeling "tingly"  - Sweating  - Hard, fast heartbeat  - Headache  - Confusion or irritability    If you feel these symptoms, do the following:  1. Check your blood sugar, if it is less than 70 mg/dL  it is too low.  2. Follow the "Rule of 15"- eat or drink something with 15 grams of sugar right away like:  - 3-4 glucose tabs, OR  - half a cup of juice or regular soda (not diet soda), OR  - 4 to 5 hard candies  3. Check your blood sugar again in 15 minutes.  If it is still too low, repeat "Rule of 15".  4. Recheck your blood sugar again in 15 minutes. If it is still low, call your healthcare provider right away.  5. Once your blood sugar rises, eat a small snack (like half a sandwich) if your next planned meal is more than half an hour away.    Call your doctor:  - If your blood sugar is less than 70 mg/dL twice in one day or twice in one week  - If your blood sugar is always greater than 300 mg/dL every time you check for two days in a  row  - If you have vomiting or diarrhea and cannot keep any liquids down for more than 24 hours      Handouts Given to You (if applicable)    Diabetes Education Handbook

## 2011-04-19 NOTE — Plan of Care (Signed)
Problem: Tissue Perfusion - Cardiopulmonary, Altered  Goal: Hemodynamic stability  Outcome: Met  VSS, assessed and documented q 4 hrs overnight. Patient denies SOB on RA, breathing even & unlabored. Patient denies any chest pain, maintained on continuous telemetry in NSR without ectopy. No acute events occurred overnight. Plan is for discharge to home in AM following MD clearance.    Problem: Falls - Risk of  Goal: Absence of falls  Outcome: Met  Patient is a low fall risk, able to ambulate to bathroom independently overnight with steady gait. Bed in low and locked position overnight, call bell within reach @ all times. No falls occurred during shift.     Problem: Discharge Planning  Goal: Participation in care planning  Outcome: Met  Patient remains informed re: plan of care overnight. Hourly rounds maintained throughout shift. Patient understands she will likely be discharged to home in AM with PO pain control measures. Patient aware of plan of care, verbalized understanding.     Problem: Pain - Acute  Goal: Communication of presence of pain  Outcome: Met  Patient able to communicate abdominal pain to RN using verbal pain scale. Patient maintained on alternating pain medication regimen with IV Dilaudid and PO Percocet with adequate pain control overnight. Pain assessed with VS q 4 hrs overnight and 1 hr following pain medication administration.     Problem: Nausea/Vomiting  Goal: Absence of nausea  Outcome: Met  Patient denies any nausea, no emesis overnight. Diet advanced to low residue, tolerated dinner with no complaints.     Problem: Alteration in Blood Glucose  Goal: Glucose level within specified parameters  Outcome: Met  Blood glucose 80 @ bedtime, snack provided. Patient understands signs/symptoms of hypoglycemia and what to report to RN. Glucose checked AC/HS as ordered. No insulin coverage required throughout shift.     Problem: Skin Integrity Impaired: Secondary to pressure ulcer(admission or hospital  acquired), cellulitis, incontinence, surgical wound/incision, chronic wound or skin disease  Goal: Absence of infection signs and symptoms  Outcome: Met  Patient with no skin breakdown, skin remains C/D/I and free of pressure areas. No active signs of infection, patient remains afebrile with WBC WNL overnight.

## 2011-04-20 LAB — GLUCOSE POCT, BLOOD
Meter Glucose (POC): 121 mg/dL — ABNORMAL HIGH (ref 70–115)
Meter Glucose (POC): 138 mg/dL — ABNORMAL HIGH (ref 70–115)
Meter Glucose (POC): 137 mg/dL — ABNORMAL HIGH (ref 70–115)
Meter Glucose (POC): 79 mg/dL (ref 70–115)
Meter Glucose (POC): 127 mg/dL — ABNORMAL HIGH (ref 70–115)
Meter Glucose (POC): 136 mg/dL — ABNORMAL HIGH (ref 70–115)

## 2011-04-21 LAB — ECG, COMPLETE (HC/~~LOC~~/ENCINITAS)
QRS INTERVAL/DURATION: 90 ms
VENTRICULAR RATE: 74 {beats}/min

## 2011-04-22 ENCOUNTER — Telehealth (INDEPENDENT_AMBULATORY_CARE_PROVIDER_SITE_OTHER): Payer: Self-pay | Admitting: Internal Medicine

## 2011-04-22 NOTE — Telephone Encounter (Signed)
Chart Review: pt has an appointment 04/23/2011 1:35 PM Ho, MD.

## 2011-04-23 ENCOUNTER — Encounter (INDEPENDENT_AMBULATORY_CARE_PROVIDER_SITE_OTHER): Payer: Self-pay | Admitting: Internal Medicine

## 2011-04-23 ENCOUNTER — Ambulatory Visit (INDEPENDENT_AMBULATORY_CARE_PROVIDER_SITE_OTHER): Payer: PRIVATE HEALTH INSURANCE | Admitting: Internal Medicine

## 2011-04-23 VITALS — BP 112/80 | HR 77 | Temp 98.4°F | Resp 14 | Ht 64.0 in | Wt 175.0 lb

## 2011-04-23 MED ORDER — LORAZEPAM 1 MG OR TABS
1.0000 mg | ORAL_TABLET | Freq: Three times a day (TID) | ORAL | Status: DC | PRN
Start: 2011-04-23 — End: 2011-05-06

## 2011-04-23 MED ORDER — LISINOPRIL 40 MG OR TABS
40.0000 mg | ORAL_TABLET | Freq: Two times a day (BID) | ORAL | Status: DC
Start: 2011-04-23 — End: 2011-05-06

## 2011-04-23 MED ORDER — PROMETHAZINE HCL 12.5 MG OR TABS
12.5000 mg | ORAL_TABLET | Freq: Two times a day (BID) | ORAL | Status: DC | PRN
Start: 2011-04-23 — End: 2011-05-06

## 2011-04-23 NOTE — Progress Notes (Signed)
ATTENDING NOTE    Patient Active Problem List   Diagnoses   . Diabetes   . Hypertension   . Chronic back pain   . Anxiety   . Hypertension, malignant   . Retinal hemorrhage   . Abdominal pain   . Acute ischemic colitis   . Screening for malignant neoplasm of breast   . Screening for breast cancer   . Myofascial pain   . Piriformis syndrome     Chief Complaint   Patient presents with   . Hospital F/U     colitis acute        Subjective:   I reviewed the history and physical exam with the resident.  Katherine Church is a 45 year old female who presents for follow-up  Recently hospitalized for acute abdominal pain and discharge on 04/19/11 with the diagnosis of NSAID colopathy.  Patient had a colonoscopy/EGD as there was concern for ischemic colitis or other IBD.  Multiple biopsies were taken per discharge note and biopsies were negative for evidence of IBD.  During colonoscopy a biopsy of rectosigmoid region revealed a tubular adenoma. She was advised to follow up with GI for a repeat colonoscopy in 3-5 years.  Abdominal pain is now controlled with Percocets and Tramadol.  Also on ativan for anxiety.  Has f/u with GI tomorrow.  Pain is 4/10.  Soft stools, no BRBPR.  EGD showed an "ulcer".  Now on Prilosec OTC.    Diabetes:  HbA1c=6.6.  FSG in 150's.  No numbness.  Retinal exam in March.  Glipizide XL 5mg  po daily.    HL:  Poorly controlled, LDL=>150, off statin because of c/o GI upset; last lipid panel in 11/2010; need to repeat    HTN:  On 4 antihypertensive medications.  bp well-controlled today.  bp increases when anxious.    Anxiety:  Uses ativan 1mg  TID to control anxiety.  Says she has tried SSRI and Effexor XR without relief of her anxiety symptoms.  Does not have a psychiatrist but willing to see one for anxiety in order to see if there are other alternatives than just continuing ativan TID.    SH:      Moved from [place] in 2010   Does try to exercise to lose weight    Review of Systems (ROS): As per  the  resident's note.  Past Medical, Family, Social History:  As per  the resident's  note.        Objective: BP 112/80  Pulse 77  Temp(Src) 98.4 F (36.9 C) (Oral)  Resp 14  Ht 5\' 4"  (1.626 m)  Wt 79.379 kg (175 lb)  BMI 30.04 kg/m2  SpO2 98%  LMP 01/10/2011    I concur with the resident's exam.    Medical Plan of Care:     Assessment and plan reviewed with the resident physician.  I agree with the resident's plan as documented as well outlined by Dr. Anselm Jungling.    See the resident's note for further details.

## 2011-04-23 NOTE — Patient Instructions (Signed)
1. Please return for fasting lipid panel  2. Make appointment for mammogram  3. Return to clinic in 6 weeks for pap smear

## 2011-04-23 NOTE — Progress Notes (Signed)
 Chief Complaint   Patient presents with   . Hospital F/U     colitis acute      SUBJECTIVE::Katherine Church is a 45 year old female with multiple medical problems including myofascial pain, piriformis syndrome, DM2, retinal hemorrhage, malignant HTN, chronic lower back pain, and history of ischemic colitis is here for re-establishment of care.  Prior PCP was Dr. Robin Searing who left for neurology residency.  She was previously seen by Dr. Ileene Rubens on 03/06/11 for acute RLQ pain that was ruled out for appendicitis.  She was recently hospitalized from 8/28-04/19/11 for abdominal pain and BRBPR.  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  Colo with biopsy negative for ischemic colitis this time.  Patient was discharged and presents today to follow up.  She is to follow up with GI clinic as well.    Patient reports taking advil, advil PM for many years for general pain and sleep.  Takes about 4 tablets daily.  No longer taking NSAIDS since discharge.  Now taking percocet for intestinal spasm, especially when she has bowel movement.  She takes ativan to help relax and help with abdominal pain.  She was previously on Valium but taken off.  She reports having PUD due to anxiety and stress.  Whenever she goes off Valium, she would get ulcers.  She is taking OTC prilosec.  Does not know dose.    She has an appointment with GI tomorrow.      1. Abd pain: Since discharge, abdominal pain improving, currently 4-5/10 pain.  Eating/drinking okay.  She is taking colace and miralax OTC daily.  She was previously constipated for 9 months.  Now she is having softer stool that is greenish brown color.  No hematochezia or melena recently.  Still has nausea, which is helped with phenergan.  Denies dysuria, hematemesis, hematuria.  Weight is stable, although she wants to lose weight.  She was taking ativan 1mg  about twice a day to help with anxiety.      2. DM: taking glipizide XL 5mg  daily.  Checks AM glucose daily,  averaging at 150 before taking glipizide.  Denies acute visual changes.  Patient does have left retinal hemorrhage in 06/2009.  No numbness or tingling.  Last retinal eye exam was 10/2010 that was reportedly normal.  She is starting to exercise again after discharge.  Eating low carbohydrate.     3. Malignant HTN: taking all 4 antihypertensive medications.  Since discharge, patient denies dizziness/light-headedness. Checks BP at home.      4. Chronic pain (back): previously taking Advil for pain relief, especially R sciatica.  She takes ultram about 3 times a day with symptomatic relief.      5. HLD: she had previously been on a statin (unknown name), but was instructed to stop taking it in early 2011 for possible GI side effect.  She has not been taking it since.      6. Anxiety: was previously on Valium 5mg  daily, but was taken off.  She has recurrent gastric ulcers when off anti-anxiety medications due to high stress.  REportedly tried zoloft, paxil, and effexor in the past for many months without success.  Effexor caused hyperactivity.  Would like to continue ativan 1mg  two to three times daily for control.     HCM: LMP was in 04/16/11.  Last pap smear was 3-4 years ago (in [place]), which was normal.  She moved to [place] since 03/2009 and has not had  a pelvic exam.  Last mammogram was in 2009 with concern for a "spot on R breast".  Had EGD and colonoscopy in 03/2011 while hospitalized.  She will follow up with GI clinic.  Unable to get Tdap due to tetanus toxoid allergy.         Past Medical History   Diagnosis Date   . Back pain    . High blood pressure    . Diabetes mellitus    . Hyperlipidemia LDL goal < 100      Past Surgical History   Procedure Date   . Anesth,tubal ligation/transection 1994   . Colonoscopy        Social History:  History   Substance Use Topics   . Smoking status: Never Smoker    . Smokeless tobacco: Never Used   . Alcohol Use: No     Family History   Problem Relation Age of Onset   .  Diabetes Father    . Lipids Father    . Hypertension Father    . Hypertension Mother        Allergies:  Allergies   Allergen Reactions   . Ciprofloxacin Rash   . Flagyl (Metronidazole Hcl) Rash   . Tetanus Toxoids Swelling     Current Outpatient Prescriptions   Medication Sig   . lisinopril (PRINIVIL, ZESTRIL) 40 MG tablet Take 1 tablet by mouth 2 times daily.   . promethazine (PHENERGAN) 12.5 MG tablet Take 1 tablet by mouth every 12 hours as needed for Nausea.   Marland Kitchen LORazepam (ATIVAN) 1 MG tablet Take 1 tablet by mouth every 8 hours as needed for Anxiety.   . carvedilol (COREG) 25 MG tablet Take 1 tablet by mouth 2 times daily.   Marland Kitchen oxyCODONE-acetaminophen (PERCOCET) 5-325 MG per tablet Take 1 tablet by mouth every 6 hours as needed for Severe Pain (Pain Score 7-10).   Marland Kitchen glipiZIDE (GLIPIZIDE XL) 5 MG CR tablet Take 1 tablet by mouth daily.   . traMADol (ULTRAM) 50 MG tablet Take 2 tablets by mouth every 8 hours as needed for Moderate Pain (Pain Score 4-6).   Marland Kitchen oxyCODONE-acetaminophen (PERCOCET) 5-325 MG per tablet Take 1 tablet by mouth every 6 hours as needed for Severe Pain (Pain Score 7-10).   . ondansetron (ZOFRAN) 4 MG tablet Take 1 tablet by mouth every 8 hours as needed for Nausea/Vomiting.   . clonidine (CATAPRES) 0.3 MG tablet Take 1 tablet by mouth 3 times daily.   Marland Kitchen amLODIPINE (NORVASC) 10 MG tablet Take 1 tablet by mouth daily.   Marland Kitchen docusate sodium (COLACE) 100 MG capsule Take 2 capsules by mouth 2 times daily.   Marland Kitchen DISCONTD: LORazepam (ATIVAN) 1 MG tablet Take 1 tablet by mouth every 6 hours as needed for Anxiety.   Marland Kitchen DISCONTD: promethazine (PHENERGAN) 12.5 MG tablet Take 1 tablet by mouth every 12 hours as needed for Nausea.   Marland Kitchen DISCONTD: lisinopril (PRINIVIL, ZESTRIL) 40 MG tablet Take 1 tablet by mouth 2 times daily.       ROS:12 point review of systems, reviewed and negative except as per HPI and PMHx.     OBJECTIVE:: BP 112/80  Pulse 77  Temp(Src) 98.4 F (36.9 C) (Oral)  Resp 14  Ht 5\' 4"  (1.626  m)  Wt 79.379 kg (175 lb)  BMI 30.04 kg/m2  SpO2 98%  LMP 01/10/2011  General Appearance: healthy, alert, no distress, pleasant affect, cooperative.  Eyes: conjunctivae and corneas clear. PERRL, EOM's intact.  Mouth: normal.  Neck: Neck supple. No adenopathy.  Heart: normal rate and regular rhythm, no murmurs, clicks, or gallops.  Lungs: clear to auscultation and percussion, no chest deformities noted.  Abdomen: BS normal. Abdomen soft, mild tenderness to palpation at RLQ. No masses or organomegaly.  Extremities: no cyanosis, clubbing, or edema.    Lab Results   Component Value Date    BUN 8 04/19/2011    CREAT 0.50 04/19/2011    CL 99 04/19/2011    NA 138 04/19/2011    K 4.2 04/19/2011    [place] 9.6 04/19/2011    TBILI 0.2 04/15/2011    ALB 3.9 04/15/2011    TP 6.9 04/15/2011    AST 33 04/15/2011    ALK 56 04/15/2011    BICARB 26 04/19/2011    ALT 31 04/15/2011    GLU 142 04/19/2011     Lab Results   Component Value Date    WBC 8.7 04/19/2011    RBC 4.42 04/19/2011    HGB 12.7 04/19/2011    HCT 37.4 04/19/2011    MCV 84.6 04/19/2011    MCHC 34.0 04/19/2011    RDW 13.2 04/19/2011    PLT 262 04/19/2011    PLT 219 05/21/2009    MPV 9.9 04/19/2011    SEG 54 04/19/2011    LYMPHS 36 04/19/2011    MONOS 6 04/19/2011    EOS 4 04/19/2011    BASOS 1 09/04/2009     Lab Results   Component Value Date    TSH 1.27 05/02/2010     Lab Results   Component Value Date    A1C 6.6 04/11/2011     Lab Results   Component Value Date    CHOL 266 11/28/2010    HDL 51 11/28/2010    LDLCALC 153 11/28/2010    TRIG 312 11/28/2010         A/P:  Toya was seen today for hospital f/u.    Diagnoses and associated orders for this visit:    Abdominal Pain: thought to be due to NSAID colopathy. Pain improved.  Tolerating oral intake with soft stool.  -  Follow up with GI clinic  -  Continue stool softeners, colace and miralax daily  -  Patient understands to avoid NSAIDs    Hypertension: Goal BP <130/80 , well controlled with current 4 medication regimen.  Patient without pre-syncopal symptoms.  Will record  blood pressure with home BP cuff twice weekly.   - lisinopril (PRINIVIL, ZESTRIL) 40 MG tablet; Take 1 tablet by mouth 2 times daily.  -    Continue coreg 25 BID, norvasc 10 daily, lisinopril 40 BID, and clonidine 0.3mg  TID    Nausea: well controlled with phenergan.  - promethazine (PHENERGAN) 12.5 MG tablet; Take 1 tablet by mouth every 12 hours as needed for Nausea.    Anxiety: controlled with ativan.  Reportedly failed zoloft, paxil, and effexor in the past.  Okay with seeing psychiatrist if ativan is needed long-term for anxiety and GI symptoms.   - LORazepam (ATIVAN) 1 MG tablet; Take 1 tablet by mouth every 8 hours as needed for Anxiety.  -    Will refer to psychiatry at next visit    Breast cancer screening: last mammogram done in [place] in 2010.  - Screening Mammogram Bilateral    Diabetes: recent A1c 6.6 with fasting glucose of 150s.  No numbness/tingling.  REtinaly eye exam normal in 2012.  -   Continue glipizide XL 5mg  daily  -  Will perform monofilament exam next visit     Chronic back pain: well controlled with ultram    Hld (hyperlipidemia): not on statin.  Report of GI upset from prior statin.  LDL >150, TG elevated in 11/2010.  Would try to aim for LDL <100 in patient with DM and HTN.  Repeat fasting lipid panel  - Lipid Panel Green Plasma Separator Tube; Future    HCM: Patient will discuss about pneumovax at next visit.  Unable to get Tdap due to tetanus toxoid allergy.  Will get mammogram.  Pap smear at next visit.      D/W Dr. Soundra Pilon    Follow-up in 6 week(s).  Patient Instruction:  See Patient Education/Instruction section.   Patient was educated on clinical, laboratory and imaging findings.   Barriers to Learning assessed: none. All of the patients questions were answered to patient's satisfaction. Patient verbalizes understanding of teaching

## 2011-04-24 ENCOUNTER — Other Ambulatory Visit (INDEPENDENT_AMBULATORY_CARE_PROVIDER_SITE_OTHER): Payer: Self-pay | Admitting: Internal Medicine

## 2011-04-24 ENCOUNTER — Ambulatory Visit (HOSPITAL_BASED_OUTPATIENT_CLINIC_OR_DEPARTMENT_OTHER): Payer: PRIVATE HEALTH INSURANCE | Admitting: Gastroenterology

## 2011-04-24 MED ORDER — PEG 3350-KCL-NA BICARB-NACL 420 GM OR SOLR
4000.00 mL | ORAL | Status: AC
Start: 2011-04-24 — End: 2011-04-25

## 2011-04-24 MED ORDER — POLYETHYLENE GLYCOL 3350 OR PACK
17.00 g | PACK | Freq: Every day | ORAL | Status: DC
Start: ? — End: 2011-05-06

## 2011-04-24 MED ORDER — TUMS PO
ORAL | Status: DC
Start: ? — End: 2012-10-10

## 2011-04-24 MED ORDER — OMEPRAZOLE 20 MG OR CPDR
20.00 mg | DELAYED_RELEASE_CAPSULE | Freq: Every day | ORAL | Status: DC
Start: ? — End: 2011-05-06

## 2011-04-24 NOTE — Discharge Summary (Signed)
Date of Admission:  04/08/2011  Date of Discharge:  04/19/2011    Patient Name:  Katherine Church    Principal Diagnosis (required):  Acute ischemic colitis    Hospital Problem List (required):  No resolved problems to display.    Active Hospital Problems   Diagnoses   . *Acute ischemic colitis [557.0]   . Diabetes [250.00]   . Hypertension [401.9]   . Anxiety [300.00]      Resolved Hospital Problems   Diagnoses       Additional Hospital Diagnoses ("rule out" or "suspected" diagnoses, etc.):  Tubular adenoma of colon    Principal Procedure During This Hospitalization (required):  CT imaging of abdomen    Other Procedures Performed During This Hospitalization (required):  EGD  Colonoscopy  USG abdomen    Procedure results are available in Chart Review in Epic.  For those providers external to [facility], the key procedure results are listed below:  CT abdomen:  IMPRESSION: CT scan with IV contrast  1. Unremarkable CT scan of the abdomen and pelvis. No findings to suggest a colitis. No evidence of perforation or free air.   2. Normal appearance of the vasculature.    Colonoscopy:  - There were few, focal superficial small ulcers in the rectum, rectosigmoid junction, distal sigmoid colon and splenic flexure with normal appearing mucosa in-between these segments; multiple biopsies are taken.   - 2 mm sessile polyp in the recto-sigmoid junction; removed by cold excisional biopsy.   - Otherwise normal colon to the terminal ileum. Multiple random biopsies are taken from terminal ileum, right colon and left colon and placed in separate jars to evaluate for the underlying chronic inflammation    MRI enterography:  1. Hyperenhancement of the mucosa in the proximal sigmoid colon which may be on the basis of decompression of this segment of bowel versus mucosal inflammation.  2. Prominent right iliac chain and inguinal lymph nodes. Recommend correlation with right lower extremity inflammation.        Consultations Obtained During This  Hospitalization:  Gastroenterology    Key consultant recommendations:  Katherine Church is a 45 year old female with hx of DM, HTN and episode of ischemic colitis in 2011, presenting with RLQ abdominal pain, nausea and what may be melena vs hematochezia. Patient did have ischemic colitis in 2011 and is having similar sx now, but no CT evidence of disease and unlike previous presentations her pain is in the RLQ at this time. High suspicion for NSAIDs as culprit for possible NSAID colitis or enteritis vs ischemia. Earlier this hospitalization she has had EGD/ Colonoscopy and results are mentioned above. Biopsy did not show any e/o IBD. Her stool is positive for WBC. Her MR enterography showed Hyperenhancement of the mucosa in the proximal sigmoid colon which may be on the basis of decompression of this segment of bowel versus mucosal inflammation. Her C diff PCR is neg. She has no growth of organism in her stool culture. Her stool is negative for ova and parasite. Repeat colonoscopy 04/16/11 with completed bowel preparation showed focal superficial small ulcers in the rectum, rectosigmoid junction, distal sigmoid colon and splenic flexure with normal appearing mucosa in-between these segments. 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      Reason for Admission to the Hospital / History of Present Illness: From Dr Larae Grooms H&P  Katherine Church is a 45 year old woman with PMHx  of DM, HTN and ischemic colitis (2011) who is here for evaluation of the above chief complaint. On Friday evening around 2am [name] states that she began having severe abdominal pain located in her RLQ, which she characterized as a pressure that was relived once she had a watery BM that same evening. Since Friday evening she has been having a constant feeling of pressure in her RLQ exacerbated by movement and eating and briefly relieved by BM. Stated  that she did not take anything to relieve the pain. She endorses having elevated pulse, palpitations, hot flashes, shaking, Nausea, 1 episode of diarrhea per day, with the most recent BM today being described as thick and dark, and 1 episode of vomiting clear fluid in the ED. She denied fevers, SOB, difficulty swallowing. She has a Hx of ulcerative colitis in 2011 diagnosed by colonoscopy, managed medically.           Hospital Course by Problem (required):  #Abdominal pain- Patient presented with abdominal pain and bleeding concerning for a repeat ischemic/ inflammatory or infectious colitis. She was empirically started on ciprofloxacin and flagyl for infectious colitis.With the development of a drug rash both drugs were stopped and Unasyn was used instead. Initial flexible sigmoidoscopy did not reveal any etiology.  With persistent pain, an MR enterography, EGD, and colonoscopyy were performed. Colonoscopy revealed several ulcers likely secondary to chronic NSAID use. With improvement in clinical condition she was discharged and asked to refrain from NSAID use.     # Tubular adenoma: during colonoscopy biopsy of rectosigmoid region revealed a tubular adenoma. She was advised to follow up with GI for a repeat colonoscopy in 3-5 years.    #UTI-dirty UA with no growth on culture, she was treated with ciprofloxacin and the unasyn.  #Yeast infection -resolved   -oral fluconazol 1x   #HTN-Continue Home meds ,Clonidine 0.3mg  T.I.D ,Amlodipine 10mg  and she was placed on carvedilol 25mg  B.I.D   And lisinopril 40mg  B.I.D       Tests Outstanding at Discharge Requiring Follow Up:  Pathology evaluation of colon biopsy    Discharge Condition (required):  Stable.    Key Physical Exam Findings at Discharge:  No significant physical examination findings at the time of discharge.    Discharge Diet:  Low-salt and Diabetic / low-carbohydrate.    Discharge Medications:  Discharge Medication List as of 04/19/2011  1:50 PM      START  taking these medications    Details   !! oxyCODONE-acetaminophen (PERCOCET) 5-325 MG per tablet Take 1 tablet by mouth every 6 hours as needed for Severe Pain (Pain Score 7-10)., 1 tablet, Oral, EVERY 6 HOURS PRN Starting 04/19/2011, Until Discontinued, Disp-90 tablet, R-0, Handwritten      LORazepam (ATIVAN) 1 MG tablet Take 1 tablet by mouth every 6 hours as needed for Anxiety., 1 mg, Oral, EVERY 6 HOURS PRN Starting 04/19/2011, Until Discontinued, Disp-10 tablet, R-0, Fax       !! - Potential duplicate medications found. Please discuss with provider.      CONTINUE these medications which have CHANGED    Details   carvedilol (COREG) 25 MG tablet Take 1 tablet by mouth 2 times daily., 25 mg, Oral, 2 TIMES DAILY Starting 04/19/2011, Until Discontinued, Disp-60 tablet, R-2, Fax         CONTINUE these medications which have NOT CHANGED    Details   glipiZIDE (GLIPIZIDE XL) 5 MG CR tablet Take 1 tablet by mouth daily., 5 mg, Oral, DAILY Starting  04/08/2011, Until Discontinued, Disp-90 tablet, R-1, ePrescribe      traMADol (ULTRAM) 50 MG tablet Take 2 tablets by mouth every 8 hours as needed for Moderate Pain (Pain Score 4-6)., 100 mg, Oral, EVERY 8 HOURS PRN Starting 04/08/2011, Until Thu 05/08/11, Disp-180 tablet, R-0, ePrescribe      promethazine (PHENERGAN) 12.5 MG tablet Take 1 tablet by mouth every 12 hours as needed for Nausea., 12.5 mg, Oral, EVERY 12 HOURS PRN Starting 03/20/2011, Until Discontinued, Disp-45 tablet, R-0, ePrescribe      !! oxyCODONE-acetaminophen (PERCOCET) 5-325 MG per tablet Take 1 tablet by mouth every 6 hours as needed for Severe Pain (Pain Score 7-10)., 1 tablet, Oral, EVERY 6 HOURS PRN Starting 03/07/2011, Until Discontinued, Disp-12 tablet, R-0, Security Rx Print      ondansetron (ZOFRAN) 4 MG tablet Take 1 tablet by mouth every 8 hours as needed for Nausea/Vomiting., 4 mg, Oral, EVERY 8 HOURS PRN Starting 03/07/2011, Until Discontinued, Disp-15 tablet, R-0, Security Rx Print      clonidine  (CATAPRES) 0.3 MG tablet Take 1 tablet by mouth 3 times daily., 0.3 mg, Oral, 3 TIMES DAILY Starting 01/21/2011, Until Discontinued, Disp-180 tablet, R-3, ePrescribe      lisinopril (PRINIVIL, ZESTRIL) 40 MG tablet Take 1 tablet by mouth 2 times daily., 40 mg, Oral, 2 TIMES DAILY Starting 01/21/2011, Until Discontinued, Disp-60 tablet, R-1, ePrescribe      amLODIPINE (NORVASC) 10 MG tablet Take 1 tablet by mouth daily., 10 mg, Oral, DAILY Starting 12/18/2010, Until Discontinued, Disp-90 tablet, R-3, ePrescribe      docusate sodium (COLACE) 100 MG capsule Take 2 capsules by mouth 2 times daily., 200 mg, Oral, 2 TIMES DAILY Starting 06/26/2010, Until Discontinued, Disp-360 capsule, R-4, ePrescribe       !! - Potential duplicate medications found. Please discuss with provider.          Allergies:  Allergies   Allergen Reactions   . Ciprofloxacin Rash   . Flagyl (Metronidazole Hcl) Rash   . Tetanus Toxoids Swelling       Discharge Disposition:  Home.    Discharge Code Status:  Full code / full care  This code status is not changed from the time of admission.    Follow Up Appointments:    Scheduled appointments:  No future appointments.    For appointments requested for after discharge that have not yet been scheduled, refer to the Post Discharge Referrals section of the After Visit Summary.    Discharging Physician's Contact Information:  [facility] phone triage at [phone].

## 2011-04-24 NOTE — Progress Notes (Signed)
GI CLINIC R1 NOTE    Date/Time: 04/24/2011 10:42 AM    Reason for Visit: post-discharge for colitis    Type of Visit: Consult    History of Present Illness:  45 year old woman with a Hx of DM, HTN and ischemic colitis, recently hospitalized from 8/28-04/19/11 for abdominal pain and BRBPR. 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  In 2011, she was admitted for similar symptoms and and was found to have patchy necrosis on sigmoidoscopy and diagnosed with ischemic colitis with unknown etiology.  Since discharge, abdominal pain improving, currently 4-5/10 pain. Eating/drinking okay. She is taking colace and miralax OTC daily. She was previously constipated for 9 months. Now she is having softer stool that is greenish brown color. No hematochezia or melena recently. Still has nausea, which is helped with phenergan. Denies dysuria, hematemesis, hematuria. Weight is stable, although she wants to lose weight. She was taking ativan 1mg  about twice a day to help with anxiety      Patient Active Problem List   Diagnoses Date Noted   . Myofascial pain 10/07/2010   . Piriformis syndrome 10/07/2010   . Screening for breast cancer 03/02/2010   . Screening for malignant neoplasm of breast 09/14/2009   . Acute ischemic colitis 09/08/2009   . Abdominal pain 09/02/2009   . Hypertension, malignant 05/22/2009   . Retinal hemorrhage 05/22/2009   . Diabetes 05/18/2009   . Hypertension 05/18/2009   . Chronic back pain 05/18/2009   . Anxiety 05/18/2009       Past Medical History   Diagnosis Date   . Back pain    . High blood pressure    . Diabetes mellitus    . Hyperlipidemia LDL goal < 100        Past Surgical History   Procedure Date   . Anesth,tubal ligation/transection 1994   . Colonoscopy        Current Medications:    omeprazole (PRILOSEC) 20 MG capsule Take 20 mg by mouth daily.   Calcium Carbonate Antacid (TUMS PO)    polyethylene glycol (MIRALAX) packet Take 17 g by mouth daily.   lisinopril (PRINIVIL, ZESTRIL)  40 MG tablet Take 1 tablet by mouth 2 times daily.   promethazine (PHENERGAN) 12.5 MG tablet Take 1 tablet by mouth every 12 hours as needed for Nausea.   LORazepam (ATIVAN) 1 MG tablet Take 1 tablet by mouth every 8 hours as needed for Anxiety.   carvedilol (COREG) 25 MG tablet Take 1 tablet by mouth 2 times daily.   oxyCODONE-acetaminophen (PERCOCET) 5-325 MG per tablet Take 1 tablet by mouth every 6 hours as needed for Severe Pain (Pain Score 7-10).   glipiZIDE (GLIPIZIDE XL) 5 MG CR tablet Take 1 tablet by mouth daily.   traMADol (ULTRAM) 50 MG tablet Take 2 tablets by mouth every 8 hours as needed for Moderate Pain (Pain Score 4-6).   oxyCODONE-acetaminophen (PERCOCET) 5-325 MG per tablet Take 1 tablet by mouth every 6 hours as needed for Severe Pain (Pain Score 7-10).   ondansetron (ZOFRAN) 4 MG tablet Take 1 tablet by mouth every 8 hours as needed for Nausea/Vomiting.   clonidine (CATAPRES) 0.3 MG tablet Take 1 tablet by mouth 3 times daily.   amLODIPINE (NORVASC) 10 MG tablet Take 1 tablet by mouth daily.   docusate sodium (COLACE) 100 MG capsule Take 2 capsules by mouth 2 times daily.       Allergies:  Allergies  Allergen Reactions   . Ciprofloxacin Rash   . Flagyl (Metronidazole Hcl) Rash   . Tetanus Toxoids Swelling       Family History:  Family History   Problem Relation Age of Onset   . Diabetes Father    . Lipids Father    . Hypertension Father    . Hypertension Mother        History     Social History   . Marital Status: Single     Spouse Name: N/A     Number of Children: 2   . Years of Education: N/A     Occupational History   . unemployed      Social History Main Topics   . Smoking status: Never Smoker    . Smokeless tobacco: Never Used   . Alcohol Use: No   . Drug Use: No      remote history of cocaine.  Currently occasional MJ smoking   . Sexually Active: Yes -- Female partner(s)      tubal ligation     Other Topics Concern   . Blood Transfusions No   . Special Diet No   . Exercise Yes     tries to  walk     Social History Narrative    Two kids-adult in [place] (going thru med school [employer]); son still deciding.Born and raised in [place].Moved to [facility] in august 2010-boyfriend works for [employer].       Review of Systems:  Constituitional:  Negative for weight loss, weakness, anorexia, fever or chills  Eyes: recent retinal hemorrhage  Ears/Throat:  Negative for hearing loss or changes, sore throat, or hoarseness  CV:  Negative for palpitations, orthopnea, or chest pain  Respiratory:  Negative for cough, shortness of breath, sputum, or wheezing  Genitourinary:  Negative for dysuria, frequency, urgency, nocturia, or hematuria  GI:  See HPI above  Musculoskeletal:  Negative for arthalgia, joint pain.  Has chronic back pain  Skin:  Negative for lesions or rash  Neuro:  Negative for syncope, seizure, or dizziness  Psych:  Negative for depression.  Has h/o anxiety  Endocrine:  Negative for diabetes or thyroid problems  Heme/Lymph:  Negative for bruising or anemia      Physical examination:   Pulse 78  Temp(Src) 98 F (36.7 C) (Oral)  Ht 5\' 4"  (1.626 m)  Wt 79.379 kg (175 lb)  BMI 30.04 kg/m2  SpO2 98%  LMP 01/10/2011 Body mass index is 30.04 kg/(m^2).  Wt Readings from Last 2 Encounters:   04/24/11 79.379 kg (175 lb)   04/23/11 79.379 kg (175 lb)    Blood Pressure   04/23/11 112/80   04/19/11 122/85      Pain Score: 4  General Appearance: healthy, alert, no distress, pleasant affect, cooperative.   Eyes: conjunctivae and corneas clear. PERRL, EOM's intact.   Mouth: normal.   Neck: Neck supple. No adenopathy.   Heart: normal rate and regular rhythm, no murmurs, clicks, or gallops.   Lungs: clear to auscultation and percussion, no chest deformities noted.   Abdomen: BS normal. Abdomen soft, mild tenderness to palpation at RLQ. No masses or organomegaly.   Extremities: no cyanosis, clubbing, or edema.      Lab Results  Lab Results   Component Value Date    WBC 8.7 04/19/2011    RBC 4.42 04/19/2011    HGB 12.7  04/19/2011    HCT 37.4 04/19/2011    MCV 84.6 04/19/2011  MCHC 34.0 04/19/2011    RDW 13.2 04/19/2011    PLT 262 04/19/2011    PLT 219 05/21/2009    MPV 9.9 04/19/2011     Lab Results   Component Value Date    BUN 8 04/19/2011    CREAT 0.50 04/19/2011    CL 99 04/19/2011    NA 138 04/19/2011    K 4.2 04/19/2011    [place] 9.6 04/19/2011    TBILI 0.2 04/15/2011    ALB 3.9 04/15/2011    TP 6.9 04/15/2011    AST 33 04/15/2011    ALK 56 04/15/2011    BICARB 26 04/19/2011    ALT 31 04/15/2011    GLU 142 04/19/2011     Lab Results   Component Value Date    AST 33 04/15/2011    ALT 31 04/15/2011    ALK 56 04/15/2011    TP 6.9 04/15/2011    ALB 3.9 04/15/2011    TBILI 0.2 04/15/2011    DBILI 0.1 04/08/2011     04/11/11   ESR 26; CRP 6.1  ANA positive 1:40 titer  Anti neutro Cytoplasim neg     Stool studies   c diff neg   Stool cutlures pending   Stool WBC positive     Colonoscopy   Endoscopic Diagnosis: - Enema prep.   - GIF H180 scope advanced to appearances of cecum,   can not confirm as solid impacted stool can not be cleared.   - Normal appearing mucosa in left colon mostly (although   stool covering mucosa, once cleared with irrigation,   appeared healthy underneath) except splenic flexture area   (35 cm-40 cm proximal to anal verge) where there is   erythema, also diverticula observed in this area. S/P   biopsies.   - Slight rectal erythema, s/p biopsies   - Right colon mucosa appears healthy (see above for extent   of exam), s/p biopsies   - Retroflexion not done as can not hold in air   Recommendations: Follow biopsy results, other   recommendations per in-patient GI consultation note.   Should get complete colonoscopy given small polyp   appearances in left colon, unsatisfactory prep for   colonsocopy today and RLQ pain, this can only be done   once patient can tolerate preparation (in or out patient okay)     EGD   Endoscopic Diagnosis: - Normal esophagus except GE   junction that had erythema and slight nodularity s/p biopsies   (likely from N/V)   - Slight gastric  erythema but no ulcers, no old or fresh blood   seen. Biopsies taken   - Normal appearing duodenum, s/p biopsies   Recommendations: Follow up biopsy results. Other   recommendations per GI inpatient consultation note and   see colonoscopy report.   MR Enterogag 04/12/11   Impression:  1. Hyperenhancement of the mucosa in the proximal sigmoid colon which may be   on the basis of decompression of this segment of bowel versus mucosal   inflammation.  2. Prominent right iliac chain and inguinal lymph nodes. Recommend   correlation with right lower extremity inflammation.    Pathology   A: Stomach, biopsy   -Gastric mucosa with no significant histopathology.   -No Helicobacter organisms identified.   B: Small bowel, biopsy   -Small intestinal mucosa with no significant histopathology.   C: Gastroesophageal junction, biopsy   -Gastroesophageal junction with foveolar hyperplasia and reactive   changes of gastric cardia.  D: Left colon, random, biopsy   -Colonic mucosa with minimal lamina propria fibrosis and no other   significant histopathology.   E: Colon, right, biopsy   -Colonic mucosa with no significant histopathology.   F: Rectum, biopsy   -Rectal mucosa with minimal lamina propria fibrosis and no other   significant histopathology.   9/5/12Endoscopic Diagnosis: - There were few, focal superficial   small ulcers in the rectum, rectosigmoid junction, distal   sigmoid colon and splenic flexure with normal appearing   mucosa in-between these segments; multiple biopsies are   taken.   - 2 mm sessile polyp in the recto-sigmoid junction; removed   by cold excisional biopsy.   - Otherwise normal colon to the terminal ileum. Multiple   random biopsies are taken from terminal ileum, right colon   and left colon and placed in separate jars to evaluate for   the underlying chronic inflammation.   04/16/11- colonoscopy -focal superficial small ulcers in the rectum, rectosigmoid junction, distal sigmoid colon and splenic flexure  with normal appearing mucosa in-between these segments. This could be seconadry to NSIAD vs ischemic colitis.     04/18/11 US abdomen   IMPRESSION:  Mild hepatomegaly, otherwise, no abnormalities noted.      A/P: Toby Katherine Church is a 45 year old female with hx of DM, HTN and episode of ischemic colitis in 2011, recently admitted for RLQ abdominal pain, nausea and what may be melena vs hematochezia. Patient had ischemic colitis in 2011, but no current CT evidence of disease.  Her c/o pain are in the RLQ, but colonoscopy 04/16/11 with completed bowel preparation showed lesions on the left side: focal superficial small ulcers in the rectum, rectosigmoid junction, distal sigmoid colon and splenic flexure with normal appearing mucosa in-between these segments.  EGD/ colonoscopy and biopsy results (above) did not show any e/o IBD.  Her MR enterography showed hyperenhancement of the mucosa in the proximal sigmoid colon which may be on the basis of decompression of this segment of bowel versus mucosal inflammation.  She did not have profuse diarrhea, her C diff PCR is neg, and stool was negative for organisms and O&P, making infection less likely.  She did, however, have an elevated CRP and ESR.  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    - abstain from NSAIDs  - minimize narcotics  - cont Benefiber/Citrucel, Miralax BID or as needed.  Can use Milk of Magnesia prn  - counsel pt on diet/nutrition, review handouts  - repeat colonoscopy in 8 weeks to assess evolution of changes  - check ESR/CRP and fecal calprotectin prior  - surveillance colonoscopy 7 years, as a tubular adenoma was found    Pt seen and discussed with attending Dr. Brunetta Genera, who agrees with this assessment and plan.

## 2011-04-24 NOTE — Telephone Encounter (Signed)
Seen in office. Closing Encounter.

## 2011-04-24 NOTE — Progress Notes (Signed)
Attending Note:    Subjective:  I reviewed the history.  Patient interviewed and examined.  History of present illness (HPI):  Recheck     Review of Systems (ROS): As per  the resident's note.  Past Medical, Family, Social History:  As per  the resident's  note.    Objective:   I have examined the patient and I concur with the resident's exam.    Assessment and plan reviewed with the resident physician.  I agree with the resident's plan as documented.    See the resident's note for further details.

## 2011-04-28 ENCOUNTER — Encounter (INDEPENDENT_AMBULATORY_CARE_PROVIDER_SITE_OTHER): Payer: Self-pay | Admitting: Internal Medicine

## 2011-05-01 ENCOUNTER — Inpatient Hospital Stay
Admission: EM | Admit: 2011-05-01 | Discharge: 2011-05-06 | Disposition: A | Discharge status: Home Health | Payer: Self-pay | Attending: Infectious Diseases | Admitting: Infectious Diseases

## 2011-05-01 ENCOUNTER — Encounter (HOSPITAL_COMMUNITY): Payer: Self-pay

## 2011-05-01 ENCOUNTER — Telehealth (INDEPENDENT_AMBULATORY_CARE_PROVIDER_SITE_OTHER): Payer: Self-pay | Admitting: Neurology

## 2011-05-01 ENCOUNTER — Other Ambulatory Visit (HOSPITAL_COMMUNITY): Payer: Self-pay | Admitting: Emergency Medicine

## 2011-05-01 DIAGNOSIS — K55039 Acute (reversible) ischemia of large intestine, extent unspecified: Principal | ICD-10-CM

## 2011-05-01 DIAGNOSIS — E785 Hyperlipidemia, unspecified: Secondary | ICD-10-CM

## 2011-05-01 DIAGNOSIS — R109 Unspecified abdominal pain: Secondary | ICD-10-CM

## 2011-05-01 DIAGNOSIS — I10 Essential (primary) hypertension: Secondary | ICD-10-CM

## 2011-05-01 DIAGNOSIS — K5900 Constipation, unspecified: Secondary | ICD-10-CM

## 2011-05-01 HISTORY — DX: Ulcerative colitis, unspecified, without complications (CMS-HCC): K51.90

## 2011-05-01 LAB — CBC WITH DIFF, BLOOD
Abs Eosinophils: 0.2 10*3/uL (ref 0.0–0.5)
Abs Lymphs: 1.9 10*3/uL (ref 0.8–3.1)
Abs Monos: 0.6 10*3/uL (ref 0.2–0.8)
Absolute Neutrophil Count: 10.3 10*3/uL — ABNORMAL HIGH (ref 1.6–7.0)
Eosinophils: 2 % (ref 1–7)
Hct: 38.7 % (ref 34.0–45.0)
Hgb: 13.3 gm/dL (ref 11.2–15.7)
Lymphocytes: 14 % — ABNORMAL LOW (ref 19–53)
MCH: 28.4 pg (ref 26.0–32.0)
MCHC: 34.4 % (ref 32.0–36.0)
MCV: 82.5 um3 (ref 79.0–95.0)
MPV: 10.6 fL (ref 9.4–12.4)
Monocytes: 4 % — ABNORMAL LOW (ref 5–12)
Plt Count: 244 10*3/uL (ref 140–370)
RBC: 4.69 10*6/uL (ref 3.90–5.20)
RDW: 13.2 % (ref 12.0–14.0)
Segs: 79 % — ABNORMAL HIGH (ref 34–71)
WBC: 13 10*3/uL — ABNORMAL HIGH (ref 4.0–10.0)

## 2011-05-01 LAB — TYPE & SCREEN: Antibody Screen: NEGATIVE

## 2011-05-01 LAB — URINALYSIS
Bilirubin: NEGATIVE
Blood: NEGATIVE
Glucose: NEGATIVE
Ketones: NEGATIVE
Leuk Esterase: NEGATIVE
Nitrite: NEGATIVE
Specific Gravity: 1.016 (ref 1.002–1.030)
WBC: <1 (ref 0–?)
pH: 8 (ref 5.0–8.0)

## 2011-05-01 LAB — LIVER PANEL, BLOOD
ALT (SGPT): 43 U/L — ABNORMAL HIGH (ref 0–33)
AST (SGOT): 34 U/L — ABNORMAL HIGH (ref 0–32)
Albumin: 4.7 g/dL (ref 3.5–5.2)
Alkaline Phos: 83 U/L (ref 35–140)
Bilirubin, Dir: 0.1 mg/dL (ref <0.2)
Bilirubin, Tot: 0.6 mg/dL (ref <1.2)
Total Protein: 8.3 g/dL — ABNORMAL HIGH (ref 6.0–8.0)

## 2011-05-01 LAB — BASIC METABOLIC PANEL, BLOOD
BUN: 10 mg/dL (ref 6–20)
Bicarbonate: 21 mmol/L — ABNORMAL LOW (ref 22–29)
Calcium: 10.8 mg/dL — ABNORMAL HIGH (ref 8.6–10.5)
Chloride: 98 mmol/L (ref 98–107)
Creatinine: 0.54 mg/dL (ref 0.51–0.95)
Glucose: 198 mg/dL — ABNORMAL HIGH (ref 70–115)
Potassium: 4.7 mmol/L (ref 3.5–5.1)
Sodium: 137 mmol/L (ref 136–145)

## 2011-05-01 LAB — LACTATE, BLOOD
Lactate: 32.6 mg/dL — ABNORMAL HIGH (ref 4.5–19.8)
Lactate: 15 mg/dL (ref 4.5–19.8)

## 2011-05-01 LAB — APTT, BLOOD: PTT: 18.3 s — ABNORMAL LOW (ref 25.0–34.0)

## 2011-05-01 LAB — GFR: GFR: >60 mL/min

## 2011-05-01 LAB — PROTHROMBIN TIME, BLOOD
INR: 1
PT,Patient: 10.3 s (ref 9.7–12.5)

## 2011-05-01 LAB — LIPASE, BLOOD: Lipase: 27 U/L (ref 13–60)

## 2011-05-01 MED ORDER — MORPHINE SULFATE 4 MG/ML IJ SOLN
4.0000 mg | INTRAMUSCULAR | Status: DC | PRN
Start: 2011-05-01 — End: 2011-05-02
  Administered 2011-05-02 (×2): 4 mg via INTRAVENOUS
  Filled 2011-05-01 (×2): qty 1

## 2011-05-01 MED ORDER — SODIUM CHLORIDE 0.9 % IV BOLUS
1000.0000 mL | INJECTION | Freq: Once | INTRAVENOUS | Status: AC
Start: 2011-05-01 — End: 2011-05-01
  Administered 2011-05-01: 1000 mL via INTRAVENOUS

## 2011-05-01 MED ORDER — GLUCOSE 4 GM PO CHEW (CUSTOM)
4.0000 | CHEWABLE_TABLET | ORAL | Status: DC | PRN
Start: 2011-05-01 — End: 2011-05-06

## 2011-05-01 MED ORDER — SODIUM CHLORIDE 0.9 % IV SOLN
12.5000 mg | Freq: Once | INTRAVENOUS | Status: AC
Start: 2011-05-01 — End: 2011-05-01
  Filled 2011-05-01: qty 0.5

## 2011-05-01 MED ORDER — SODIUM CHLORIDE 0.9 % IJ SOLN (CUSTOM)
3.0000 mL | INTRAMUSCULAR | Status: DC | PRN
Start: 2011-05-01 — End: 2011-05-06

## 2011-05-01 MED ORDER — SODIUM CHLORIDE 0.9 % IJ SOLN (CUSTOM)
3.0000 mL | Freq: Three times a day (TID) | INTRAMUSCULAR | Status: DC
Start: 2011-05-01 — End: 2011-05-06
  Administered 2011-05-01: 3 mL via INTRAVENOUS

## 2011-05-01 MED ORDER — TRAMADOL HCL 50 MG OR TABS
100.0000 mg | ORAL_TABLET | Freq: Three times a day (TID) | ORAL | Status: DC | PRN
Start: 2011-05-01 — End: 2011-05-02
  Administered 2011-05-02: 100 mg via ORAL
  Filled 2011-05-01: qty 2

## 2011-05-01 MED ORDER — ONDANSETRON HCL 4 MG/2ML IV SOLN
4.0000 mg | Freq: Four times a day (QID) | INTRAMUSCULAR | Status: DC | PRN
Start: 2011-05-01 — End: 2011-05-06
  Administered 2011-05-03 – 2011-05-06 (×5): 4 mg via INTRAVENOUS
  Filled 2011-05-01 (×6): qty 2

## 2011-05-01 MED ORDER — LORAZEPAM 1 MG OR TABS
1.0000 mg | ORAL_TABLET | Freq: Three times a day (TID) | ORAL | Status: DC | PRN
Start: 2011-05-01 — End: 2011-05-02

## 2011-05-01 MED ORDER — LANSOPRAZOLE 30 MG OR CPDR
30.0000 mg | DELAYED_RELEASE_CAPSULE | Freq: Two times a day (BID) | ORAL | Status: DC
Start: 2011-05-02 — End: 2011-05-06
  Administered 2011-05-02 – 2011-05-03 (×3): 30 mg via ORAL
  Filled 2011-05-01 (×10): qty 1

## 2011-05-01 MED ORDER — ZOLPIDEM TARTRATE 5 MG OR TABS
5.0000 mg | ORAL_TABLET | Freq: Every evening | ORAL | Status: DC | PRN
Start: 2011-05-01 — End: 2011-05-02
  Filled 2011-05-01: qty 1

## 2011-05-01 MED ORDER — DEXTROSE (DIABETIC USE) 40 % OR GEL
1.0000 | ORAL | Status: DC | PRN
Start: 2011-05-01 — End: 2011-05-06

## 2011-05-01 MED ORDER — HYDROMORPHONE HCL 1 MG/ML IJ SOLN
1.0000 mg | Freq: Once | INTRAMUSCULAR | Status: AC
Start: 2011-05-01 — End: 2011-05-01
  Filled 2011-05-01: qty 1

## 2011-05-01 MED ORDER — OXYCODONE-ACETAMINOPHEN 5-325 MG OR TABS
1.0000 | ORAL_TABLET | Freq: Four times a day (QID) | ORAL | Status: DC | PRN
Start: 2011-05-01 — End: 2011-05-02
  Administered 2011-05-01 – 2011-05-02 (×2): 1 via ORAL
  Filled 2011-05-01 (×2): qty 1

## 2011-05-01 MED ORDER — PROMETHAZINE HCL 25 MG OR TABS
12.5000 mg | ORAL_TABLET | Freq: Four times a day (QID) | ORAL | Status: DC | PRN
Start: 2011-05-01 — End: 2011-05-06
  Administered 2011-05-03: 12.5 mg via ORAL
  Filled 2011-05-01 (×6): qty 1

## 2011-05-01 MED ORDER — INSULIN REGULAR HUMAN 100 UNIT/ML IJ SOLN
1.0000 [IU] | Freq: Four times a day (QID) | INTRAMUSCULAR | Status: DC
Start: 2011-05-02 — End: 2011-05-04
  Administered 2011-05-02: 2 [IU] via SUBCUTANEOUS
  Administered 2011-05-03: 3 [IU] via SUBCUTANEOUS
  Administered 2011-05-04: 1 [IU] via SUBCUTANEOUS
  Filled 2011-05-01 (×3): qty 1
  Filled 2011-05-01: qty 2
  Filled 2011-05-01: qty 3

## 2011-05-01 MED ORDER — PROMETHAZINE HCL 25 MG OR TABS
12.5000 mg | ORAL_TABLET | Freq: Four times a day (QID) | ORAL | Status: DC | PRN
Start: 2011-05-01 — End: 2011-05-01

## 2011-05-01 MED ORDER — DEXTROSE-NACL 5-0.45 % IV SOLN (CUSTOM)
INTRAVENOUS | Status: DC
Start: 2011-05-01 — End: 2011-05-02

## 2011-05-01 MED ORDER — ACETAMINOPHEN 325 MG PO TABS
650.0000 mg | ORAL_TABLET | ORAL | Status: DC | PRN
Start: 2011-05-01 — End: 2011-05-06
  Filled 2011-05-01 (×5): qty 2

## 2011-05-01 MED ORDER — DEXTROSE 50 % IV SOLN
12.5000 g | INTRAVENOUS | Status: DC | PRN
Start: 2011-05-01 — End: 2011-05-06

## 2011-05-01 MED ORDER — GLUCAGON HCL (RDNA) 1 MG IJ SOLR
1.0000 mg | Freq: Once | INTRAMUSCULAR | Status: DC | PRN
Start: 2011-05-01 — End: 2011-05-06

## 2011-05-01 MED ORDER — HYDROMORPHONE HCL 1 MG/ML IJ SOLN
1.0000 mg | Freq: Once | INTRAMUSCULAR | Status: AC
Start: 2011-05-01 — End: 2011-05-01
  Administered 2011-05-01: 1 mg via INTRAVENOUS
  Filled 2011-05-01: qty 1

## 2011-05-01 MED ORDER — SODIUM CHLORIDE 0.9 % IV SOLN
INTRAVENOUS | Status: DC | PRN
Start: 2011-05-01 — End: 2011-05-06

## 2011-05-01 MED ORDER — PROMETHAZINE HCL 25 MG/ML IJ SOLN
25.0000 mg | Freq: Once | INTRAMUSCULAR | Status: AC
Start: 2011-05-01 — End: 2011-05-01
  Administered 2011-05-01: 25 mg via INTRAMUSCULAR
  Filled 2011-05-01: qty 1

## 2011-05-01 MED ORDER — SODIUM CHLORIDE 0.9 % IV BOLUS
1000.0000 mL | INJECTION | Freq: Once | INTRAVENOUS | Status: AC
Start: 2011-05-01 — End: 2011-05-01

## 2011-05-01 NOTE — ED Notes (Signed)
Blood drawn and sent to lab, no PIV obtained, another RN to assist with PIV

## 2011-05-01 NOTE — ED Notes (Signed)
Blood samples collected/labeled and sent to the lab.

## 2011-05-01 NOTE — Consults (Signed)
Gastroenterololgy/Hepatology Consultation:    Fellow: Robbie Louis  Consulting Physician: Dr. Ria Bush  Requesting Physician: Rhoderick Moody      Reason For Consultation: Bright red blood per rectum    Thank you Dr. Laural Benes, Emelda Fear,* for requesting this consultation.    Chief Complaint: Bright red blood per rectum    History of Present Illness:     45 year old woman with a Hx of DM, HTN and ischemic colitis in 2011, recently hospitalized from 8/28-04/19/11 for abdominal pain and BRBPR. She Had extensive work up which include two colonoscopies and MR enterography. Colonoscopy on 04/16/11 showed focal superficial small ulcers in   the rectum, rectosigmoid junction, distal sigmoid colon and splenic flexure with normal appearing mucosa in-between these segments.  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 Pt followed with GI clinic last week and was feeling better until 11 pm last night.    She started having abdominal pain which started in the center of her abdomen and went down to right lower quadrant. She felt bloated and has few episodes of vomiting which are green in color and there was no blood in it. Around 5 am today, she has bright red blood per rectum which was about Half and cup. She has about 2 to 3 episodes of BRBPR. Pt also felt lightheaded but she denied syncope or h/o passing out.  She denied chest pain, headache. She denied fever and chills. She denied suing NSAID and she was using percocet for her abdominal pain.    Pt denied worsening abdominal pain with food. She denied diarrhea. She has neg C diff toxin 04/10/11 and stool was negative for ova and parasite at that time.          Current Problem List:   Patient Active Problem List   Diagnoses Date Noted   . Tubular adenoma of rectum 04/26/2011      During colonoscopy (03/2011) a biopsy of  rectosigmoid region revealed a tubular adenoma. She was advised to follow up with GI for a repeat colonoscopy in 3-5 years.       . Myofascial pain 10/07/2010   . Piriformis syndrome 10/07/2010   . Screening for breast cancer 03/02/2010   . Screening for malignant neoplasm of breast 09/14/2009   . Acute ischemic colitis 09/08/2009   . Abdominal pain 09/02/2009   . Hypertension, malignant 05/22/2009   . Retinal hemorrhage 05/22/2009   . Diabetes 05/18/2009   . Hypertension 05/18/2009   . Chronic back pain 05/18/2009   . Anxiety 05/18/2009       Past Medical History:  Past Medical History   Diagnosis Date   . Back pain    . High blood pressure    . Diabetes mellitus    . Hyperlipidemia LDL goal < 100    . Colitis, ulcerative        Past Surgical History:  Past Surgical History   Procedure Date   . Anesth,tubal ligation/transection 1994   . Colonoscopy 04/2011     Repeat surveillance colo in 2019         Social History:  History     Social History   . Marital Status: Single     Spouse Name: N/A     Number of Children: 2   . Years of Education: N/A     Occupational History   .  unemployed      Social History Main Topics   . Smoking status: Never Smoker    . Smokeless tobacco: Never Used   . Alcohol Use: No   . Drug Use: No      remote history of cocaine.  Currently occasional MJ smoking   . Sexually Active: Yes -- Female partner(s)      tubal ligation     Other Topics Concern   . Blood Transfusions No   . Special Diet No   . Exercise Yes     tries to walk     Social History Narrative    Two kids-adult in [place] (going thru med school [place]); son still deciding.Born and raised in [place].Moved to [facility] in august 2010-boyfriend works for [employer].       Family History:  Family History   Problem Relation Age of Onset   . Diabetes Father    . Lipids Father    . Hypertension Father    . Hypertension Mother        Allergy:  Allergies   Allergen Reactions   . Ciprofloxacin Rash   . Flagyl (Metronidazole Hcl) Rash   .  Tetanus Toxoids Swelling       Current Medications:  Current Facility-Administered Medications   Medication   . HYDROmorphone (DILAUDID) injection 1 mg   . promethazine (PHENERGAN) injection 25 mg   . sodium chloride 0.9 % bolus 1,000 mL     Current Outpatient Prescriptions   Medication Sig   . omeprazole (PRILOSEC) 20 MG capsule Take 20 mg by mouth daily.   . Calcium Carbonate Antacid (TUMS PO)    . polyethylene glycol (MIRALAX) packet Take 17 g by mouth daily.   Marland Kitchen lisinopril (PRINIVIL, ZESTRIL) 40 MG tablet Take 1 tablet by mouth 2 times daily.   . promethazine (PHENERGAN) 12.5 MG tablet Take 1 tablet by mouth every 12 hours as needed for Nausea.   Marland Kitchen LORazepam (ATIVAN) 1 MG tablet Take 1 tablet by mouth every 8 hours as needed for Anxiety.   . carvedilol (COREG) 25 MG tablet Take 1 tablet by mouth 2 times daily.   Marland Kitchen oxyCODONE-acetaminophen (PERCOCET) 5-325 MG per tablet Take 1 tablet by mouth every 6 hours as needed for Severe Pain (Pain Score 7-10).   Marland Kitchen glipiZIDE (GLIPIZIDE XL) 5 MG CR tablet Take 1 tablet by mouth daily.   . traMADol (ULTRAM) 50 MG tablet Take 2 tablets by mouth every 8 hours as needed for Moderate Pain (Pain Score 4-6).   Marland Kitchen oxyCODONE-acetaminophen (PERCOCET) 5-325 MG per tablet Take 1 tablet by mouth every 6 hours as needed for Severe Pain (Pain Score 7-10).   . ondansetron (ZOFRAN) 4 MG tablet Take 1 tablet by mouth every 8 hours as needed for Nausea/Vomiting.   . clonidine (CATAPRES) 0.3 MG tablet Take 1 tablet by mouth 3 times daily.   Marland Kitchen amLODIPINE (NORVASC) 10 MG tablet Take 1 tablet by mouth daily.   Marland Kitchen docusate sodium (COLACE) 100 MG capsule Take 2 capsules by mouth 2 times daily.       Review of Systems:    Constitutional: weak  Eyes: Negative  HENT: Negative  Cardiovascular: Negative  Respiratory: Negative  Gastrointestinal: abdominal pain  Genitourinary: Negative      Physical examination:  BP 123/82  Pulse 75  Temp 98.3 F (36.8 C)  Resp 20  Ht 5\' 4"  (1.626 m)  Wt 79.379 kg (175  lb)  BMI 30.04 kg/m2  SpO2  100%  LMP 01/10/2011 Body mass index is 30.04 kg/(m^2).  Wt Readings from Last 2 Encounters:   05/01/11 79.379 kg (175 lb)   04/24/11 79.379 kg (175 lb)    Blood Pressure   05/01/11 123/82   04/24/11 108/72     Temperature:  [98.3 F (36.8 C)] 98.3 F (36.8 C) (09/20 1000)  Blood pressure (BP): (97-123)/(67-82) 123/82 mmHg (09/20 1131)  Heart Rate:  [75-85] 75  (09/20 1136)  Respirations:  [20] 20  (09/20 1000)  Pain Score:  [-] 7 (09/20 1132)  O2 Device:  [-]   SpO2:  [98 %-100 %] 100 % (09/20 1136)    General: NAD, comfortable  Affect: Normal, pleasant  HEENT: EOMI, not icteric, PERRL, intact hearing, oropharynx clear  Cardiovascular: RRR, no edema  Pulm: Clear to auscultation bilaterally, no wheezes, rales  Gastrointestinal: Bowel sounds present.  Soft, Diffusely tender around the umbilical and right lower quadrant but there is no guarding or rigidity. non-distended, no hepatomegaly  Extremities: No peripheral edema. Warm well-perfused  Rectal Exam: There was small dry blood present in rectum.    Labs:    Lab Results   Component Value Date    WBC 13.0 05/01/2011    RBC 4.69 05/01/2011    HGB 13.3 05/01/2011    HCT 38.7 05/01/2011    MCV 82.5 05/01/2011    MCHC 34.4 05/01/2011    RDW 13.2 05/01/2011    PLT 244 05/01/2011    PLT 219 05/21/2009    MPV 10.6 05/01/2011       Chemistry  Lab Results   Component Value Date    NA 137 05/01/2011    K 4.7 05/01/2011    CL 98 05/01/2011    BICARB 21 05/01/2011    BUN 10 05/01/2011    CREAT 0.54 05/01/2011    GLU 198 05/01/2011     Liver  Lab Results   Component Value Date    TBILI 0.6 05/01/2011    DBILI 0.1 05/01/2011    AST 34 05/01/2011    ALT 43 05/01/2011    ALK 83 05/01/2011    ALB 4.7 05/01/2011     Coags  Lab Results   Component Value Date    INR 1.0 05/01/2011    PTT 18.3 05/01/2011   Lactic acid: 32.6  Lipase: 27    Procedures/Images:   Colonoscopy 09/03/10  Scope was advanced to distal transverse colon only given extensive areas of ischemic colitis and  risk of  perforation. Patchy areas of ulcerated colonic mucosa with inflammation and edema in the sigmoid colon and splenic  flexure, which became confluent areas of severely ulcerated, inflammed, edematous colonic mucosa with  multiple dusky areas in the descending colon, concerning for patchy necrosis. Cannot rule out dead bowel. Multiple  biopsies taken from these areas. Rectum and distal transverse colon appeared relatively spared.     Pathology: Mucosal necrosis with fibrinopurulent exudate        Colonoscopy: 04/10/11   - Normal appearing mucosa in left colon mostly (although stool covering mucosa, once cleared with irrigation, appeared healthy underneath) except splenic flexture area (35 cm-40 cm proximal to anal verge) where there is erythema, also diverticula observed in this area.   - Slight rectal erythema  - Right colon mucosa appears healthy (see above for extent of exam)  EGD:   - Normal esophagus except GE junction that had erythema and slight nodularity s/p biopsies   (likely from N/V)   - Slight gastric  erythema but no ulcers, no old or fresh blood seen. Biopsies taken   - Normal appearing duodenum, s/p biopsies           Pathology 04/10/11   A: Stomach, biopsy   -Gastric mucosa with no significant histopathology.   -No Helicobacter organisms identified.   B: Small bowel, biopsy   -Small intestinal mucosa with no significant histopathology.   C: Gastroesophageal junction, biopsy   -Gastroesophageal junction with foveolar hyperplasia and reactive   changes of gastric cardia.   D: Left colon, random, biopsy   -Colonic mucosa with minimal lamina propria fibrosis and no other   significant histopathology.   E: Colon, right, biopsy   -Colonic mucosa with no significant histopathology.   F: Rectum, biopsy   -Rectal mucosa with minimal lamina propria fibrosis and no other   significant histopathology.     MR enterography 09/02:   Findings:    Adequate distention of the proximal bowel obtained. Distal small  bowel was   poorly distended.    The small bowel and proximal large bowel are unremarkable, without evidence of  mural thickening, stenoses, fistulae or focal dilatation. Symmetric mural   enhancement is identified in the small bowel and proximal large bowel, without  evidence of mucosal hyperenhancement or engorgement of the vasa recta.    Within the proximal sigmoid colon, there is apparent hyperenhancement of the   mucosa. No pericolonic inflammatory change is noted.    Mildly prominent right iliac chain and inguinal lymph nodes are noted   measuring up to 9 mm. No other suspicious adenopathy is identified.    Remainder of the visualized solid viscera are unremarkable.      Impression:    1. Hyperenhancement of the mucosa in the proximal sigmoid colon which may be   on the basis of decompression of this segment of bowel versus mucosal   inflammation.  2. Prominent right iliac chain and inguinal lymph nodes. Recommend   correlation with right lower extremity inflammation.         Colonoscopy 04/16/11   Findings: - There were few, focal superficial small ulcers in   the rectum, rectosigmoid junction, distal sigmoid colon and   splenic flexure with normal appearing mucosa in-between   these segments; multiple biopsies are taken.   - 2 mm sessile polyp in the recto-sigmoid junction; removed   by cold excisional biopsy.   - Otherwise normal colon to the terminal ileum. Multiple   random biopsies are taken from terminal ileum, right colon   and left colon and placed in separate jars to evaluate for   the underlying chronic inflammation.     Pathology:  FINAL PATHOLOGIC DIAGNOSIS:   A: Terminal ileum, biopsy   -Benign small intestinal mucosa with no specific pathologic   abnormality.   B: Right colon, biopsy   -Benign colonic mucosa with mild architectural alterations.   C: Splenic flexure of colon, lesions, biopsy   -Benign colonic mucosa with mild architectural alterations.   D: Random left colon, biopsy   -Benign  colonic mucosa with no significant pathologic abnormality.   E: Rectosigmoid lesions, biopsy   -Benign colonic mucosa with focal mild lamina propria fibrosis.   F: Rectosigmoid polyps, biopsy   -Tubular adenoma.   G: Rectal lesion, biopsy   -Focal ulceration with reparative changes.      A/P:       Katherine Church is a 45 year old female with hx of DM, HTN and episode of ischemic  colitis in 2011, presenting with abdominal pain more in right lower quadrant and hypogastric region and it was associated with vomiting in the morning and bright red blood per rectum.    Impression:  In her recent  hospitalization she has had EGD/ Colonoscopy and results are mentioned above. Biopsy did not show any e/o IBD. Her stool was positive for WBC in her previous hospitalization but C diff and stool for ova and parasite was negative. Her MR  Enterography on 04/13/11 showed showed Hyperenhancement of the mucosa in the proximal sigmoid colon which may be on the basis of decompression of this segment of bowel versus mucosal inflammation. She had no growth of organism in her stool culture previously. Repeat colonoscopy 04/16/11 showed focal superficial small ulcers in the rectum, rectosigmoid junction, distal sigmoid colon and splenic flexure with normal appearing mucosa in-between these segments. 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 Biopsies showed some lamina propria fibrosis and focal ulceration with reparative changes in the rectum.  She is presented again with BRBPR. She did feel lightheaded but her Hb is 13.3 which is her baseline. Her chemistry panel did not show significant acidosis although her lactic acid is 32.6 and her WBC is 13 K. Given her history of ischemic colitis, it is possible that she has bowel ischemia again given her lactate and WBC. Infectious etiology can also be possible but her previous work was negative for C diff.  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 At this time, it is imperative to rule out bowel ischemia.    Recommendations:    -- Follow up CBC, chemistry and lactic acid repeat it Q6 hours.  -- NPO for now  -- Consider doing CT scan of abdomen and pelvis with contrast to rule out bowel ischemia  -- Pt has recent colonoscopies with biopsies. Will do Flex sigmoidoscopy tomorrow.  -- two large bore IV cannulas and Normal saline.                    Thank you for the interesting consult.    Patient  Was discussed with Dr. Ria Bush    Electronically Signed by:  Robbie Louis  Gastroenterology/Hepatology Fellow  05/01/2011

## 2011-05-01 NOTE — ED Notes (Signed)
Pt reports ABD pain is coming up and rates her pain 6/10, comfort measures given. MD [facility] informed.

## 2011-05-01 NOTE — ED Notes (Signed)
Assumed care of pt, pt here for rectal bleed. Pt resting in  Bed breathing evenly non labor in NAD. Pt reports ABD pain 7/10 comfort measures given. Pt reports nausea and had some vomiting last night. Pt placed on full cardiac monitoring on audible alarms on NSR at 77bpm no ectopy noted.

## 2011-05-01 NOTE — ED Notes (Signed)
Pt reports feeling dizzy and light headed, Pt remains in full cardiac monitoring on audible alarms on NSR at 63bpm no ectopy noted, 100% o2 sat RA.

## 2011-05-01 NOTE — Telephone Encounter (Signed)
Hospitalized 04/08/2011 -04/19/2011 acute ischemic colitis  Office Visit 04/23/2011 Dr. Anselm Jungling  Office Visit 04/24/2011 GI Department    Symptoms: "I had blood in my stool and return of the cramping".   During visits above no reports of blood in stool   Onset:   11 pm - worsening of nausea, emesis x 1  3 am - abd pain 10/10, "cramping", "pain woke me up". Pt noted blood and mucous in stool, cramping slightly relieved by BM   8 am - another episode of above, noted increased blood in stool     Reports pain is bad her "stomach muscles hurt". She reports continued na/ with decreased appetite.endorses hot flashes and "chills".   Advised: ED within the next 30 mins - 1 hours. Please have someone drive you for safety.

## 2011-05-01 NOTE — ED Notes (Signed)
Pt back from CT, pt reports ABD pain is now 7/10, comfort measures given and MD [facility] informed.

## 2011-05-01 NOTE — ED Notes (Signed)
Pt resting in bed breathing evenly non labor in NAD. Pt reports her pain is better and rates her pain 5/10, pt states pain is tolerable for her at this time.

## 2011-05-01 NOTE — H&P (Signed)
HISTORY AND PHYSICAL    Attending MD:   Vincent Gros., MD    Chief Complaint:  hematochezia    Pain Assessment:  The patient denies any pain.     History of Present Illness:     Katherine Church is a 45 year old female with history of ischemic collitis and recent admission for LGIB who is here for evaluation of the above chief complaint.  Pt was admitted last month for abdominal pain and brbpr.  She underwent colonoscopy and EGD; she was ultimately diagnosed with NSAID colopathy and was advised to avoid nsaids.  Since discharge, pt reports she was feeling well until the past week when she experienced postprandial nausea and abdominal fullness.  Last night she had a bowel movement with approximately 1/2 cup of bright red blood.  No LH, dizziness, vomiting.  She noticed fatigue and systolic blood pressure in the 90s (baseline >120s).  Her abdominal pain also became more diffuse, R >L, and constant.  She denies further NSAID use  She presented to the ED for evaluation.    In ED, pt was hemodynamically stable.  Rectal exam revealed bright red blood.  Labs notable for hgb 13.3 and lactate 32.6 though decreased to 15 on repeat.   CT a/p did not show evidence of ischemic colitis or other source of bleed per prelim report.  GI was consulted and is planning possible flex sig tomorrow.    Past Medical and Surgical History:  Past Medical History   Diagnosis Date   . Back pain    . High blood pressure    . Diabetes mellitus    . Hyperlipidemia LDL goal < 100    . Colitis, ulcerative      Past Surgical History   Procedure Date   . Anesth,tubal ligation/transection 1994   . Colonoscopy 04/2011     Repeat surveillance colo in 2019       Allergies:  Allergies   Allergen Reactions   . Ciprofloxacin Rash   . Flagyl (Metronidazole Hcl) Rash   . Tetanus Toxoids Swelling       Medications:  Prescriptions prior to admission   Medication Sig Dispense Refill   . omeprazole (PRILOSEC) 20 MG capsule Take 20 mg by mouth daily.       .  Calcium Carbonate Antacid (TUMS PO)        . polyethylene glycol (MIRALAX) packet Take 17 g by mouth daily.       Marland Kitchen lisinopril (PRINIVIL, ZESTRIL) 40 MG tablet Take 1 tablet by mouth 2 times daily.  60 tablet  1   . promethazine (PHENERGAN) 12.5 MG tablet Take 1 tablet by mouth every 12 hours as needed for Nausea.  45 tablet  0   . LORazepam (ATIVAN) 1 MG tablet Take 1 tablet by mouth every 8 hours as needed for Anxiety.  20 tablet  0   . carvedilol (COREG) 25 MG tablet Take 1 tablet by mouth 2 times daily.  60 tablet  2   . oxyCODONE-acetaminophen (PERCOCET) 5-325 MG per tablet Take 1 tablet by mouth every 6 hours as needed for Severe Pain (Pain Score 7-10).  90 tablet  0   . glipiZIDE (GLIPIZIDE XL) 5 MG CR tablet Take 1 tablet by mouth daily.  90 tablet  1   . traMADol (ULTRAM) 50 MG tablet Take 2 tablets by mouth every 8 hours as needed for Moderate Pain (Pain Score 4-6).  180 tablet  0   .  oxyCODONE-acetaminophen (PERCOCET) 5-325 MG per tablet Take 1 tablet by mouth every 6 hours as needed for Severe Pain (Pain Score 7-10).  12 tablet  0   . ondansetron (ZOFRAN) 4 MG tablet Take 1 tablet by mouth every 8 hours as needed for Nausea/Vomiting.  15 tablet  0   . clonidine (CATAPRES) 0.3 MG tablet Take 1 tablet by mouth 3 times daily.  180 tablet  3   . amLODIPINE (NORVASC) 10 MG tablet Take 1 tablet by mouth daily.  90 tablet  3   . docusate sodium (COLACE) 100 MG capsule Take 2 capsules by mouth 2 times daily.  360 capsule  4       Social History:  History     Social History   . Marital Status: Single     Spouse Name: N/A     Number of Children: 2   . Years of Education: N/A     Occupational History   . unemployed      Social History Main Topics   . Smoking status: Never Smoker    . Smokeless tobacco: Never Used   . Alcohol Use: No   . Drug Use: No      remote history of cocaine.  Currently occasional MJ smoking   . Sexually Active: Yes -- Female partner(s)      tubal ligation     Other Topics Concern   . Blood  Transfusions No   . Special Diet No   . Exercise Yes     tries to walk     Social History Narrative    Two kids-adult in [place] (going thru med school [place]); son still deciding.Born and raised in [place].Moved to [place] in august 2010-boyfriend works for [employer].   lives with boyfriend, rest of family remains in [place]    Family History:  Family History   Problem Relation Age of Onset   . Diabetes Father    . Lipids Father    . Hypertension Father    . Hypertension Mother        Review of Systems:  10/14 systems reviewed, negative except as stated in hpi    Physical Exam:  BP 119/78  Pulse 74  Temp 98.6 F (37 C)  Resp 20  Ht 5\' 4"  (1.626 m)  Wt 79.379 kg (175 lb)  BMI 30.04 kg/m2  SpO2 98%  LMP 01/10/2011  General Appearance: skin warm, dry, and pink, pale, pleasant woman in NAD.  Mouth: normal.  Neck:  Neck supple. No adenopathy, thyroid symmetric, normal size.  Heart:  normal rate and regular rhythm, no murmurs, clicks, or gallops.  Lungs: clear to auscultation and percussion, no chest deformities noted.  Abdomen: no masses palpable, no organomegaly, nabsx4, ttp in all four quadrants .  Extremities:  no cyanosis, clubbing, or edema.  Skin:  Skin color, texture, turgor normal. No rashes or lesions.    Labs and Other Data:  Lab Results   Component Value Date    NA 137 05/01/2011    K 4.7 05/01/2011    CL 98 05/01/2011    BICARB 21* 05/01/2011    BUN 10 05/01/2011    CREAT 0.54 05/01/2011    GLU 198* 05/01/2011    [place] 10.8* 05/01/2011     Lab Results   Component Value Date    WBC 13.0* 05/01/2011    HGB 13.3 05/01/2011    HCT 38.7 05/01/2011    PLT 244 05/01/2011    SEG  79* 05/01/2011    LYMPHS 14* 05/01/2011    MONOS 4* 05/01/2011    EOS 2 05/01/2011     Lab Results   Component Value Date    AST 34* 05/01/2011    ALT 43* 05/01/2011    ALK 83 05/01/2011    TBILI 0.6 05/01/2011    DBILI 0.1 05/01/2011    TP 8.3* 05/01/2011    ALB 4.7 05/01/2011     Lab Results   Component Value Date    INR 1.0 05/01/2011    PTT 18.3*  05/01/2011     Lab Results   Component Value Date    PHUA 8.0 05/01/2011    SGUA 1.016 05/01/2011    GLUCOSEUA Negative 05/01/2011    KETONEUA Negative 05/01/2011    BLOODUA Negative 05/01/2011    PROTEINUA Trace* 05/01/2011    LEUKESTUA Negative 05/01/2011    NITRITEUA Negative 05/01/2011    WBCUA <1 05/01/2011    RBCUA None 05/01/2011     9/5/12Endoscopic Diagnosis: - There were few, focal superficial   small ulcers in the rectum, rectosigmoid junction, distal   sigmoid colon and splenic flexure with normal appearing   mucosa in-between these segments; multiple biopsies are   taken.   - 2 mm sessile polyp in the recto-sigmoid junction; removed   by cold excisional biopsy.   - Otherwise normal colon to the terminal ileum. Multiple   random biopsies are taken from terminal ileum, right colon   and left colon and placed in separate jars to evaluate for   the underlying chronic inflammation.   04/16/11- colonoscopy -focal superficial small ulcers in the rectum, rectosigmoid junction, distal sigmoid colon and splenic flexure with normal appearing mucosa in-between these segments. This could be seconadry to NSIAD vs ischemic colitis.     CT a/p 9/20 -> final report pending    Assessment and Care Plan:  81F with hx ischemic colitis and NSAID colopathy here with brbpr x1 day.     # Hematochezia: hemodynamically stable, hgb stable, and no further bloody bms in ED.  Ddx for lgib includes recurrent nsaid gastropathy, ischemic colitis (though ct a/p neg per prelim read); diverticulosis/mass/ibd/infection possible but no evidence seen on colonoscopy or stool studies last month.  Pt denies further nsaid use since discharge; still noted to have chronic constipation.  - GI consulted in ED, appreciate recs  - npo, ivf  - 2 large bore pivs, active type and screen  - trend cbc+lactate q6h  - possible flex sig in am, will contact gi to verify recs  - f/u final read of ct a/p  - consider stool studies if has additional bms  - check esr/crp  - prn  zofran for nausea, prn morphine for severe pain    Chronic medical issues:  - HTN: holding home meds in case of brisk bleed/hypoTN o/n; can consider restarting in am  - DMII: ssi   - anxiety: cont ativan prn  - insomnia: ambien prn    FEN: NPO for possible procedure in am    DVT proph: on hold due to concern for active bleed    This plan and alternatives have been discussed with the patient and/or surrogate.    Code Status: Full   No orders of the defined types were placed in this encounter.       The patient's primary care physician or clinic has not been contacted regarding this admission.    Note Author: Saintclair Halsted. Ellenore Roscoe, 05/01/2011, 8:55 PM      .

## 2011-05-01 NOTE — ED Attending Note (Signed)
ED ATTENDING NOTE:    Case discussed with Dr. Megan Salon. Patient seen and examined by myself. Agree with history, physical, and care plan.    44 f pt.with h/o ischemic colitis and receent admission for gi bleed that was left without definitive dx presents c/o low abd pain and brb in bm this am.  On exam, she has some ttp low abd iwthout peritoneal signs.  Rectal noted.  In ed, labs screened, gi consulted, ct abd planned to r/o ischemic colitis.  If ct neg, will be admitted to medicine service with gi service leading care.  hgb stable. Leukocytosis.  Lactate elevated.  Plan for rpt lactate while awaiting ct abd. And c/s surg if not improving.     impresison  abd pain  Rectal bleeding

## 2011-05-01 NOTE — ED Notes (Signed)
Signout from Dr. Megan Salon- 62F h/o brpbr, mesenteric ischemia p/w abd pain, rectal bleeding, prior admission for brbpr NSAID colopathy. Today elevated lactate 35 > CT w/ o e/o mesenteric ischemia. GI aware. Hemodynamically stable. Admitted to med tele    Campbell Lerner, MD  Resident  05/01/11 2030

## 2011-05-01 NOTE — ED Notes (Signed)
Pt remains resting in bed breathing evenly non labor in AND, waiting for CT. CT was called per CT tech, pt is next in line, 2 traumas on the way.

## 2011-05-01 NOTE — ED Notes (Signed)
Endorsed care of pt to [facility]

## 2011-05-01 NOTE — ED Notes (Signed)
Pt resting in bed breathing evenly non labor in AND, no change in condition.

## 2011-05-01 NOTE — ED Provider Notes (Addendum)
History  Chief Complaint   Patient presents with   . Rectal Bleeding     pt states return of retal bleeding and lower abd. + nausea.     HPI    45 year old woman Hx of DM, HTN and ischemic colitis in 2011, admitted recently 8/28-04/19/11 for abdominal pain and BRBPR thought to be due to NSAID colopathy returns today with abdominal pain similar to previously located lower middle and rlq abdomen and with BRBPR. Describes last night at about 10pm she began to have dull pain and nausea worse than baseline. Pain exacerbates to 10/10.  She says that this morning she had several bowel movements that were about 1/2 cup bright red blood each. Not on any anticoagulants. Not light-headed or dizzy. Not having fevers.  Was able to tolerate medicines this morning and food yesterday.     Saw GI 9/13 with continued abdominal pain but improving, rated at 4-5/10 pain and also at that time had continued nausea being treated with phenergan.        Past Medical History   Diagnosis Date   . Back pain    . High blood pressure    . Diabetes mellitus    . Hyperlipidemia LDL goal < 100    . Colitis, ulcerative        Past Surgical History   Procedure Date   . Anesth,tubal ligation/transection 1994   . Colonoscopy 04/2011     Repeat surveillance colo in 2019       Family History   Problem Relation Age of Onset   . Diabetes Father    . Lipids Father    . Hypertension Father    . Hypertension Mother        History   Substance Use Topics   . Smoking status: Never Smoker    . Smokeless tobacco: Never Used   . Alcohol Use: No       Review of Systems per hpi    Physical Exam  BP 97/67  Pulse 85  Temp 98.3 F (36.8 C)  Resp 20  Ht 5\' 4"  (1.626 m)  Wt 79.379 kg (175 lb)  BMI 30.04 kg/m2  SpO2 98%  LMP 01/10/2011    Physical Exam    Vitals noted afebrile normotensive  Gen: NAD, conversational in full logical sentences, pleasant, appears stated age  HEENT: nc/at PERRL sclera anicteric EOMI mmm oropharynx clear with no exudates and no blood. neck  not rigid, no LAD  Lungs: CTAB, no wheezes, no crackles, no resp distress  Cardiovascular: RRR no murmur, no significantly elevated JVP. pulses equal bilaterally in UE&LE  Abdomen: tender lower abdomen, +BS, soft, Nondistended,  not rigid, no rebound, no palpable masses  Back: no cvat  Extremities: no significant LE edema. no asymmetric edema, extremities well-perfused with cap refill <3 sec.  Skin: no rashes, warm, not diaphoretic, not pale  Neuro: alert and oriented x3 at baseline, CN 2-12 intact, 5/5 strength and sensation to light touch intact throughout. gait not ataxic  Rectal: no fissure, no hemorrhoid, no friable tissue. Does have bright red dried blood in rectal vault without active bleeding. guiac pos.      ED Course/Medical Decision Making Narrative    Abdominal pain. History of ischemic colitis and NSAID induced colopathy. Lactate elevated. Leukocytosis. However, CT abdomen to evaluate for colitis negative on prelim read and repeat lactate significantly decreased from initial and so I do not think patient at this time has ischemic colitis, particular given  serial exams that are not concerning for ischemia. Called GI fellow regarding this patient given they are following as outpatient and they will perform colonscopy as inpatient. Plan to admit to medicine. Admitted to tele and not IMU given no active bleeding on my rectal exam.    Critical Care Time            Additional Notes      Home Medication List  Prior to Admission Medications   Medication Last Dose Informant Patient Reported? Taking?   omeprazole (PRILOSEC) 20 MG capsule   Yes No   Take 20 mg by mouth daily.   Calcium Carbonate Antacid (TUMS PO)   Yes No   polyethylene glycol (MIRALAX) packet   Yes No   Take 17 g by mouth daily.   lisinopril (PRINIVIL, ZESTRIL) 40 MG tablet   No No   Take 1 tablet by mouth 2 times daily.   promethazine (PHENERGAN) 12.5 MG tablet   No No   Take 1 tablet by mouth every 12 hours as needed for Nausea.   LORazepam  (ATIVAN) 1 MG tablet   No No   Take 1 tablet by mouth every 8 hours as needed for Anxiety.   carvedilol (COREG) 25 MG tablet   No No   Take 1 tablet by mouth 2 times daily.   oxyCODONE-acetaminophen (PERCOCET) 5-325 MG per tablet   No No   Take 1 tablet by mouth every 6 hours as needed for Severe Pain (Pain Score 7-10).   glipiZIDE (GLIPIZIDE XL) 5 MG CR tablet   No No   Take 1 tablet by mouth daily.   traMADol (ULTRAM) 50 MG tablet   No No   Take 2 tablets by mouth every 8 hours as needed for Moderate Pain (Pain Score 4-6).   oxyCODONE-acetaminophen (PERCOCET) 5-325 MG per tablet   No No   Take 1 tablet by mouth every 6 hours as needed for Severe Pain (Pain Score 7-10).   ondansetron (ZOFRAN) 4 MG tablet   No No   Take 1 tablet by mouth every 8 hours as needed for Nausea/Vomiting.   clonidine (CATAPRES) 0.3 MG tablet   No No   Take 1 tablet by mouth 3 times daily.   amLODIPINE (NORVASC) 10 MG tablet   No No   Take 1 tablet by mouth daily.   docusate sodium (COLACE) 100 MG capsule   No No   Take 2 capsules by mouth 2 times daily.          Takeia Ciaravino, [place], MD  Resident  05/01/11 1652    Thelton Graca, [place], MD  Resident  05/01/11 1941    Cornie Herrington, [place], MD  Resident  05/01/11 [phone]

## 2011-05-01 NOTE — ED Notes (Signed)
Report attempt to 10E, unit requesting additional 15 minutes before taking report.

## 2011-05-01 NOTE — ED Notes (Signed)
Pt to CT scan.

## 2011-05-01 NOTE — Telephone Encounter (Signed)
Verbally confirmed name of Primary Care Provider: yes    What is reason for call: patient states she has been experiencing stomach pains and she has blood in stool.     Confirmed Contact Number:yes    This message will be transmitted to our triage nurse, you can expect a call by the end of the working day.

## 2011-05-01 NOTE — ED Notes (Signed)
Pt resting in bed breathing evenly non labor in AND, pain 5/10, comfort measures given.

## 2011-05-02 MED ORDER — NARCOTIC DRIP (FOR PYXIS) PLACEHOLDER
Status: DC
Start: 2011-05-02 — End: 2011-05-05
  Filled 2011-05-02 (×3): qty 1

## 2011-05-02 MED ORDER — MORPHINE SULFATE 2 MG/ML IJ SOLN
2.0000 mg | Freq: Once | INTRAMUSCULAR | Status: AC
Start: 2011-05-02 — End: 2011-05-02
  Administered 2011-05-02: 2 mg via INTRAVENOUS
  Filled 2011-05-02: qty 1

## 2011-05-02 MED ORDER — ZOLPIDEM TARTRATE 5 MG OR TABS
5.0000 mg | ORAL_TABLET | Freq: Every evening | ORAL | Status: DC | PRN
Start: 2011-05-02 — End: 2011-05-06
  Administered 2011-05-03 – 2011-05-05 (×3): 5 mg via ORAL
  Filled 2011-05-02 (×4): qty 1

## 2011-05-02 MED ORDER — HYDROMORPHONE HCL 2 MG/ML IJ SOLN
1.5000 mg | Freq: Once | INTRAMUSCULAR | Status: AC
Start: 2011-05-02 — End: 2011-05-02
  Administered 2011-05-02: 1.5 mg via INTRAVENOUS
  Filled 2011-05-02: qty 1

## 2011-05-02 MED ORDER — PROMETHAZINE HCL 25 MG/ML IJ SOLN
25.0000 mg | Freq: Four times a day (QID) | INTRAMUSCULAR | Status: DC | PRN
Start: 2011-05-02 — End: 2011-05-03
  Filled 2011-05-02: qty 1

## 2011-05-02 MED ORDER — NALOXONE HCL 0.4 MG/ML IJ SOLN
0.1000 mg | INTRAMUSCULAR | Status: DC | PRN
Start: 2011-05-02 — End: 2011-05-06

## 2011-05-02 MED ORDER — DEXTROSE-NACL 5-0.45 % IV SOLN (CUSTOM)
INTRAVENOUS | Status: AC
Start: 2011-05-02 — End: 2011-05-03

## 2011-05-02 MED ORDER — MORPHINE SULFATE 1 MG/ML IV SOLN SYRINGE
INTRAVENOUS | Status: DC
Start: 2011-05-02 — End: 2011-05-05
  Filled 2011-05-02 (×4): qty 50

## 2011-05-02 NOTE — Plan of Care (Signed)
Problem: Bleeding - Risk of  Goal: Absence of active bleeding  Outcome: Met  No signs of bleeding at this time, s/p flex sigmoidoscopy, no bm yet, instructed to call if still with bloody stool, h/h and vs stable.    Problem: Pain - Acute  Goal: Communication of presence of pain  Outcome: Met  With constant pain to right side of abdomen, morphine pca resumed at prescribed rate, c/o 3/10 headache, tylenol given as directed, comfortable at this time, will continue to reassess pain level.    Problem: Falls - Risk of  Goal: Absence of falls  Outcome: Met  Alert and oriented, uses call light appropriately, fall preventive measures discussed with patient especially that she in on morphine pca, ambulated to the bathroom with 1 person assist.

## 2011-05-02 NOTE — Telephone Encounter (Signed)
Admitted; will f/up on discharge.

## 2011-05-02 NOTE — Interdisciplinary (Signed)
05/02/11 1312   Patient Information   Why is Patient in the Hospital? Rectal bleeding   Prior to Level of Function Ambulatory/Independent with ADL's   Assistive Device Not applicable   Discharge Planning   Living Arrangements Spouse / significant other  (lives with her boyfriend)   Support Systems Friends / neighbors   Home Care Services No   Type of Home Care Services None   Patient expects to be discharged to: plans to discharge home.     Do you have transportation home?  No  (may need taxi or bus token to get home)    Plans to discharge home.  Has no discharge needs at this time.  Will continue to assess

## 2011-05-02 NOTE — Consults (Signed)
INITIAL CONSULT NOTE    Request for Consultation:   Asked by Levonne Spiller* to evaluate this patient for abdominal pain and BRBPR.    History of Present Illness:     Katherine Church is a 45 year old female with a h/o DM, HTN, HLD who presents with cramping abdominal pain and BRBPR. Patient reports that she was in the hospital a week ago for severe abd pain that comes and goes. A colonoscopy was done at the time that showed several rectal ulcers. She describes the pain as a 10/10 cramping pain that is worse when she lies flat. She has some associated nausea and vomiting but can tolerate some po intake. She also had a colonoscopy in 2011 that showed some ischemic bowel. She followed up in clinic with Dr. Koleen Nimrod; however, she was well-controlled with antibiotics, so no surgical intervention was needed at the time.    Past Medical and Surgical History:  Past Medical History   Diagnosis Date   . Back pain    . High blood pressure    . Diabetes mellitus    . Hyperlipidemia LDL goal < 100    . Colitis, ulcerative      Past Surgical History   Procedure Date   . Anesth,tubal ligation/transection 1994   . Colonoscopy 04/2011     Repeat surveillance colo in 2019       Allergies:  Allergies   Allergen Reactions   . Ciprofloxacin Rash   . Flagyl (Metronidazole Hcl) Rash   . Tetanus Toxoids Swelling       Prior to Admission Medications:  Prescriptions prior to admission   Medication Sig Dispense Refill   . omeprazole (PRILOSEC) 20 MG capsule Take 20 mg by mouth daily.       . Calcium Carbonate Antacid (TUMS PO)        . polyethylene glycol (MIRALAX) packet Take 17 g by mouth daily.       Marland Kitchen lisinopril (PRINIVIL, ZESTRIL) 40 MG tablet Take 1 tablet by mouth 2 times daily.  60 tablet  1   . promethazine (PHENERGAN) 12.5 MG tablet Take 1 tablet by mouth every 12 hours as needed for Nausea.  45 tablet  0   . LORazepam (ATIVAN) 1 MG tablet Take 1 tablet by mouth every 8 hours as needed for Anxiety.  20 tablet  0   .  carvedilol (COREG) 25 MG tablet Take 1 tablet by mouth 2 times daily.  60 tablet  2   . oxyCODONE-acetaminophen (PERCOCET) 5-325 MG per tablet Take 1 tablet by mouth every 6 hours as needed for Severe Pain (Pain Score 7-10).  90 tablet  0   . glipiZIDE (GLIPIZIDE XL) 5 MG CR tablet Take 1 tablet by mouth daily.  90 tablet  1   . traMADol (ULTRAM) 50 MG tablet Take 2 tablets by mouth every 8 hours as needed for Moderate Pain (Pain Score 4-6).  180 tablet  0   . oxyCODONE-acetaminophen (PERCOCET) 5-325 MG per tablet Take 1 tablet by mouth every 6 hours as needed for Severe Pain (Pain Score 7-10).  12 tablet  0   . ondansetron (ZOFRAN) 4 MG tablet Take 1 tablet by mouth every 8 hours as needed for Nausea/Vomiting.  15 tablet  0   . clonidine (CATAPRES) 0.3 MG tablet Take 1 tablet by mouth 3 times daily.  180 tablet  3   . amLODIPINE (NORVASC) 10 MG tablet Take 1 tablet by mouth daily.  90 tablet  3   . docusate sodium (COLACE) 100 MG capsule Take 2 capsules by mouth 2 times daily.  360 capsule  4       Current Inpatient Medications:       . HYDROmorphone  1.5 mg Once   . NARCOTIC DRIP (FOR PYXIS)   As Directed   . promethazine (PHENERGAN) IVPB  12.5 mg Once   . sodium chloride  1,000 mL Once   . lansoprazole  30 mg BID AC   . sodium chloride  3 mL Q8H   . HYDROmorphone  1 mg Once   . insulin regular  1-6 Units Q6H          . morphine     . sodium chloride     . dextrose-sodium chloride 5%-0.45% 100 mL/hr at 05/02/11 0636          . naloxone  0.1 mg Q2 Min PRN   . promethazine  25 mg Q6H PRN   . sodium chloride  3 mL PRN   . sodium chloride   Continuous PRN   . acetaminophen  650 mg Q4H PRN   . ondansetron  4 mg Q6H PRN   . promethazine  12.5 mg Q6H PRN   . glucose  4 tablet PRN   . glucose 40%  1 Tube PRN   . dextrose  12.5 g PRN   . glucagon  1 mg Once PRN   . DISCONTD: LORazepam  1 mg Q8H PRN   . DISCONTD: oxyCODONE-acetaminophen  1 tablet Q6H PRN   . DISCONTD: promethazine  12.5 mg Q6H PRN   . DISCONTD: traMADol   100 mg Q8H PRN   . DISCONTD: morphine  4 mg Q4H PRN   . DISCONTD: zolpidem  5 mg Nightly PRN       Social History:  History     Social History   . Marital Status: Single     Spouse Name: N/A     Number of Children: 2   . Years of Education: N/A     Occupational History   . unemployed      Social History Main Topics   . Smoking status: Never Smoker    . Smokeless tobacco: Never Used   . Alcohol Use: No   . Drug Use: No      remote history of cocaine.  Currently occasional MJ smoking   . Sexually Active: Yes -- Female partner(s)      tubal ligation     Other Topics Concern   . Blood Transfusions No   . Special Diet No   . Exercise Yes     tries to walk     Social History Narrative    Two kids-adult in [place] (going thru med school [place]); son still deciding.Born and raised in [place].Moved to [facility] in august 2010-boyfriend works for [employer].       Family History:  Family History   Problem Relation Age of Onset   . Diabetes Father    . Lipids Father    . Hypertension Father    . Hypertension Mother        Review of Systems:  Please see HPI    Physical Exam:  Temperature:  [97.5 F (36.4 C)-98.7 F (37.1 C)] 98.1 F (36.7 C) (09/21 1155)  Blood pressure (BP): (97-168)/(65-98) 168/98 mmHg (09/21 1155)  Heart Rate:  [63-74] 73  (09/21 1155)  Respirations:  [14-20] 18  (09/21 1251)  Pain  Score:  [-] 7 (09/21 1251)  O2 Device:  [-] None (Room air) (09/21 0744)  SpO2:  [97 %-100 %] 97 % (09/21 1155)  Gen: obese female who appears to be in pain  Abd: soft, tenderness worse in RLQ, no rebound or guarding  Rectal: no hemorrhoids seen, no gross blood, no lesions seen    Labs and Other Data:  Lab Results   Component Value Date    NA 139 05/02/2011    K 3.8 05/02/2011    CL 102 05/02/2011    BICARB 23 05/02/2011    BUN 7 05/02/2011    CREAT 0.50* 05/02/2011    GLU 186* 05/02/2011    [place] 9.1 05/02/2011     Lab Results   Component Value Date    WBC 7.8 05/02/2011    HGB 11.8 05/02/2011    HCT 35.1 05/02/2011    PLT 199 05/02/2011     SEG 59 05/02/2011    LYMPHS 32 05/02/2011    MONOS 5 05/02/2011    EOS 4 05/02/2011     Lab Results   Component Value Date    AST 47* 05/02/2011    ALT 51* 05/02/2011    ALK 70 05/02/2011    TBILI 0.4 05/02/2011    DBILI 0.1 05/02/2011    TP 7.1 05/02/2011    ALB 4.0 05/02/2011     Lab Results   Component Value Date    INR 1.1 05/02/2011    PTT 18.3* 05/01/2011       Impression:  Katherine Church is a 44 year old female with a h/o DM, HTN, HLD who presents with cramping abdominal pain and BRBPR. No indication of colitis or bowel compromise on exam, by history, or by labs. No surgical intervention is indicated at this time.    Recommendations:  - No surgical intervention required at this time. Will sign off. Please page [phone] if exam changes or you have other questions.  - Cont bowel rest and aggressive IV hydration.          Marland Kitchen

## 2011-05-02 NOTE — Plan of Care (Addendum)
Pt c/o nausea and zofran was givn iv and also pt is c/o severe abdominal pain and pain med is not time yet, and txt was sent to dr Doyle Askew  To come and see pt and if pt can have extra dos of pain med. Awaiting for response. 1140 md are here and examined pt and pt still c/o severe pain and hydromorphone 1.5 mg ordered.nd pharmacist was called that i need the pain med stat. md's stays w/ patient reassuring pt still w/ pain , 1146 pain med dilaudid 1.5 mg given iv for sevre pain, md at bed side still,  Pt states that shes going to passed out and md tried to reassure pt and help pt to lie down, o2 2l applied, sat  94-100%,  and pt did not passed out and later pt stated her pain is better 8/10 ,1205 pt calm down and resting md lft and told pt that they are going  To give hr more pin med.continue to monitor.

## 2011-05-02 NOTE — Plan of Care (Addendum)
Problem: Bleeding - Risk of  Goal: Absence of active bleeding  Outcome: Met  No s/s of active bleeding. No BRBPR noted. Pt says she has not had rectal bleeding since yesterday at 7am. H&H 12.8/37.4. Will continue to monitor    Problem: Pain - Acute  Goal: Communication of presence of pain  Outcome: Met  Pt reports constant dull right lower abd pain with occasional cramping, rated 5-7/10 with a pain goal of 3/10. Medicated per mar for pain. Pt stated she "usually take percocet at home" and "that works for pain". Will continue to monitor    Problem: Falls - Risk of  Goal: Absence of falls  Outcome: Met  Pt is free from falls this shift. Pt ambulates to the bathroom, accompanied by RN. Gait is steady, she wearing non-skid slippers. When in bed, call light is within reach, bed is locked and in lowest position, bed rails are up x 2. Pt instructed to call RN with any needs, verbalized understanding.  Will continue to monitor this patient during hourly rounding and assess for 6 p's        Problem: Alteration in Blood Glucose  Goal: Glucose level within specified parameters  Outcome: Met  BS checks Q6H as pt is NPO. Midnight BS 162. Given 1 unit insulin. No s/s of hyperglycemia. Will recheck at 0600

## 2011-05-02 NOTE — Progress Notes (Addendum)
Daily Progress Note:  05/02/2011     Current Hospital Stay:   1 day - Admitted on: 05/01/2011    Subjective:  Patient in 10/10 abdominal pain, nauseous, asking for [place] to "help her, please."     Objective:  Vital Signs:  Temperature:  [97.5 F (36.4 C)-98.7 F (37.1 C)] 98.1 F (36.7 C) (09/21 1155)  Blood pressure (BP): (100-168)/(69-98) 168/98 mmHg (09/21 1155)  Heart Rate:  [63-74] 73  (09/21 1155)  Respirations:  [14-20] 17  (09/21 1600)  Pain Score:  [-] 5 (09/21 1600)  O2 Device:  [-] None (Room air) (09/21 0744)  SpO2:  [97 %-100 %] 97 % (09/21 1155)    Wt Readings from Last 1 Encounters:   05/01/11 79.379 kg (175 lb)       Intake/Output (Current Shift):  09/21 0600 - 09/21 1759  In: -   Out: 2300 [Urine:2300]      Physical Exam:  Gen: appears to be in acute distress from pain    -unable to examine at this time d/t acute distress, not allowing for touch, will control pain first    Laboratory data:   Lab Results   Component Value Date    NA 139 05/02/2011    K 3.8 05/02/2011    CL 102 05/02/2011    BICARB 23 05/02/2011    BUN 7 05/02/2011    CREAT 0.50* 05/02/2011    GLU 186* 05/02/2011    [place] 9.1 05/02/2011     Lab Results   Component Value Date    WBC 7.8 05/02/2011    HGB 11.8 05/02/2011    HCT 35.1 05/02/2011    PLT 199 05/02/2011    SEG 59 05/02/2011    LYMPHS 32 05/02/2011    MONOS 5 05/02/2011    EOS 4 05/02/2011     Lab Results   Component Value Date    AST 47* 05/02/2011    ALT 51* 05/02/2011    ALK 70 05/02/2011    TBILI 0.4 05/02/2011    DBILI 0.1 05/02/2011    TP 7.1 05/02/2011    ALB 4.0 05/02/2011     Lab Results   Component Value Date    INR 1.1 05/02/2011    PTT 18.3* 05/01/2011     Lab Results   Component Value Date    PHUA 8.0 05/01/2011    SGUA 1.016 05/01/2011    GLUCOSEUA Negative 05/01/2011    KETONEUA Negative 05/01/2011    BLOODUA Negative 05/01/2011    PROTEINUA Trace* 05/01/2011    LEUKESTUA Negative 05/01/2011    NITRITEUA Negative 05/01/2011    WBCUA <1 05/01/2011    RBCUA None 05/01/2011     05/01/2011  IMPRESSION:  CT scan with IV contrast  1. No radiographic evidence of colitis or bowel obstruction. The sigmoid   colon is nondistended, limiting evaluation for bowel wall thickening. However,  no pericolonic inflammatory changes are seen.     2. 1 cm nodule in the left adrenal gland, nonspecific, but appears to be   stable dating back to CT scan dated 09/02/2009.     3. Mildly prominent right iliac chain lymph nodes are stable from prior exam.    4. New mild nonspecific posterior groundglass opacities in the lung bases   which could represent atelectasis or developing patchy areas of consolidation.    Assessment and Plan:  Glennis Clester is a 45 year old female with hx of DM, HTN, HLD and ischemic colitis 1/2011here  with abdominal pain and BRBPR.     # Hematochezia: hemodynamically stable, Hgb stable, and no further bloody bms in ED. Ddx for LGIB includes recurrent nsaid gastropathy, ischemic colitis (though ct a/p neg per prelim read); diverticulosis/mass/ibd/infection possible but no evidence seen on colonoscopy or stool studies last month. Pt denies further nsaid use since discharge; still noted to have chronic constipation.   - GI consulted in ED, appreciate recs   - check hep panel (elevated AST and ALT for unknown reasons)  -amylase and lipase WNL  - NPO, IVF  - trend cbc+lactate q6h   - possible flex sig today, follow up results   - consider stool studies if has additional bms   - morphine PCA given uncontrolled pain  - PRN zofran for nausea  - surgery consult appreciated, no surgical intervention at this time.      Chronic medical issues:   - HTN: holding home meds in case of brisk bleed/hypoTN o/n; can consider restarting once patient more stable     - DMII: insulin sliding scale    - anxiety: cont ativan prn     FEN: Continue NPO, will advance to clears once her pain is under better control.     DVT proph: on hold due to concern for active bleed     FC/FC    Barrie Dunker PGY1      IM team 5 ATTENDING PROGRESS NOTE  ATTESTATION    Subjective    I have reviewed the history.  Interval history:  Today is still in excruciating pain that she localizes on the left abdomen. Pain is cramping and accompanied by Nausea, vomiting, fear of passing out.     Objective    I have examined the patient and concur with the resident/fellow/NP/PA exam.    Assessment and Plan    I agree with the resident/fellow/NP/PA care plan.    See the resident / fellow note for further details.    I spent 60 minutes of time on the patient care unit reviewing the patient's chart, examining the patient, communicating with the patient's care providers and/or family members and reviewing diagnostic studies.

## 2011-05-02 NOTE — Plan of Care (Addendum)
Problem: Bleeding - Risk of  Goal: Absence of active bleeding  Outcome: Met  Instructed pt to inform rn if had bm and stated she will;latest h/h 11.8/35.1.    Problem: Pain - Acute  Goal: Communication of presence of pain  Outcome: Not Met  Pt c/o dull pressure pain to rlq;tender to rlq on  Palpation;md was aware.pt was started on pca at 1250 and encouraged to use pain button and pt stated pain went down to 4/10 and pt stated was bearable;pt had no distress noted monitored pain and rr as ordered.  At 1838 pt back from GI and c/o mid abd and rlq pain rated 6/10 and notified DR KUo and called back and stated ok to put back pt to pca and asked if can give a 1 time order for pain and ordered morphine and pain went down to 4/10 after morphine;pt stated more comfortable.report given to incoming rn for f/u.    Problem: Alteration in Blood Glucose  Goal: Glucose level within specified parameters  Outcome: Met  Pt was npo;continued to monitor bld sugar and no glycemic reaction noted; tp received w/ d5 1/2 ns at 135ml/hr.latest f/s was 119. No ssc given and report given to incoming rn for f/u.

## 2011-05-02 NOTE — Plan of Care (Signed)
Problem: Discharge Planning  Goal: Participation in care planning  Outcome: Met  No d/c order at this time.

## 2011-05-02 NOTE — Procedures (Signed)
Report Author:  Philmore Pali. Mliss Fritz, M.D.    Date of Operation:  05/02/2011    Endoscopist: Virgel Paling    GI Fellow: Faylene Kurtz    Referring Physician:    PROCEDURE PERFORMED: COLONOSCOPY with biopsy    INDICATIONS FOR EXAMINATION: 45 yo woman with h/o  abdominal pain and bright red blood per rectum. She has  h/o ischemic colitis diagnosed in 2011.  . The patient was explained the risks, benefits, and  alternatives, and signed consent was obtained.    INSTRUMENTS: 316    MEDICATIONS: Demerol 200 mg IVP, Benadryl 25 mg IVP,  Versed 10 mg IVP    PROCEDURE TECHNIQUE: A physical exam was  performed. Informed consent was obtained from the patient  after explaining all the risks (perforation, bleeding, infection  and adverse effects to the medicine) , benefits and  alternatives to the procedure which the patient appeared to  understand and so stated.  The patient was connected to  the monitoring devices and placed in the left lateral position.  Continuous oxygen was provided with a nasal cannula and  IV medicine administered through an indwelling cannula.  After adequate moderate sedation was achieved, a digital  exam was performed and the colonoscope was introduced  into the rectum and advanced under direct visualization to  the descending colon. The scope was subsequently  removed slowly while carefully examining the color, texture,  anatomy, and integrity of the mucosa on the way out. In the  rectum, the scope was NOT retroflexed to evaluate for  internal hemorrhoids and anorectal pathology.. Air use was  minimized, and frequently suctioned. The patient was  subsequently transferred to the recovery area in satisfactory  condition.      ESTIMATED BLOOD LOSS: None. SPECIMEN: sent to  pathology (1. right colon 2. bx 30-35 cm . 3. recto-sigmoid  biopsies    Findings: 1- Normal Ascending colon except for nonspecific  patchy erythema.. Colonoscope could not be passed beyond  ascending colon secondary to stools.  Biopsies were taken  (jar 1) from erythema in Right colon.  2- Normal transverse colon.  3- Descending colon was erythematous and punctate  erythema, w/ scant washable bloody mucosa, was seen from  splenic flexure till rectosigmoid junction.  Biopsies were  taken (35-30cm) (jar 2), .  4- Distal Sigmoid/rectum. There was small amount of old  blood present in rectum. Rare erythema. vascular pattern  overall intact. Biopsies taken from rectosigmoid (jar 3)      Endoscopic Diagnosis: Predominantly sigmoid inflammation  consistent with colon ischemia in setting of history, and  previously elevated lactate. There was no stricture or  ulceration seen. Follow up biopsies to help exclude less  likely Crohn's colitis.    Recommendations: -- Will work up for coagulopathy like  antiphospholipid syndrome, Protein C/S deficiency.  - Consider Vascular Consult, given repeated episodes of  likely ischemic colitis without known etiology.  -- Avoid NSAIDS  -- Avoid hypotension  - analgesia, keep well hydrate IV fluids.  --  Recommend checking C diff, treat if positive.  -  Start clear liquid diet.            Electronically signed by:  Philmore Pali Mliss Fritz, M.D. 05/04/2011 06:05 P          DD: 05/02/2011    DT:  05/02/2011 04:50 P  DocNo.:  [id number]  BL/bsm    Referring Physician:  SELF REFERRED        Primary Care Physician:  HO THUYET M.D.  [address]  [place], [place] [address]    cc:

## 2011-05-03 LAB — URINE CULTURE

## 2011-05-03 LAB — GLUCOSE POCT, BLOOD: Meter Glucose (POC): 148 mg/dL — ABNORMAL HIGH (ref 70–115)

## 2011-05-03 MED ORDER — POLYETHYLENE GLYCOL 3350 OR PACK
17.0000 g | PACK | Freq: Once | ORAL | Status: AC
Start: 2011-05-03 — End: 2011-05-03
  Administered 2011-05-03: 17 g via ORAL
  Filled 2011-05-03: qty 1

## 2011-05-03 MED ORDER — DOCUSATE SODIUM 250 MG OR CAPS
250.0000 mg | ORAL_CAPSULE | Freq: Every day | ORAL | Status: DC
Start: 2011-05-03 — End: 2011-05-06
  Administered 2011-05-03 – 2011-05-06 (×3): 250 mg via ORAL
  Filled 2011-05-03 (×4): qty 1

## 2011-05-03 MED ORDER — LORAZEPAM 1 MG OR TABS
1.0000 mg | ORAL_TABLET | Freq: Once | ORAL | Status: AC
Start: 2011-05-03 — End: 2011-05-03
  Administered 2011-05-03: 1 mg via ORAL
  Filled 2011-05-03: qty 1

## 2011-05-03 MED ORDER — POLYETHYLENE GLYCOL 3350 OR PACK
17.0000 g | PACK | Freq: Every day | ORAL | Status: DC
Start: 2011-05-03 — End: 2011-05-06
  Administered 2011-05-04: 17 g via ORAL
  Filled 2011-05-03 (×2): qty 1

## 2011-05-03 MED ORDER — SENNA 8.6 MG OR TABS
2.0000 | ORAL_TABLET | Freq: Every day | ORAL | Status: DC
Start: 2011-05-03 — End: 2011-05-06
  Administered 2011-05-03 – 2011-05-06 (×2): 17.2 mg via ORAL
  Filled 2011-05-03 (×3): qty 2

## 2011-05-03 MED ORDER — SODIUM CHLORIDE 0.9 % IV SOLN
INTRAVENOUS | Status: DC
Start: 2011-05-03 — End: 2011-05-06
  Administered 2011-05-03: 1000 mL via INTRAVENOUS

## 2011-05-03 MED ORDER — LORAZEPAM 1 MG OR TABS
1.0000 mg | ORAL_TABLET | Freq: Four times a day (QID) | ORAL | Status: DC | PRN
Start: 2011-05-03 — End: 2011-05-06
  Administered 2011-05-03 – 2011-05-05 (×2): 1 mg via ORAL
  Filled 2011-05-03 (×5): qty 1

## 2011-05-03 NOTE — Progress Notes (Addendum)
R1/MS3 Daily Progress Note:  05/03/2011     Current Hospital Stay:   2 days - Admitted on: 05/01/2011    ID: Ms. Azcarate is a 45 yo woman with PMH sig for HTN, DM II, retinal hemorrhage, fibroids, tubal ligation, tubular adenoma on recent colonoscopy, and ischemic colitis (dx in 2011) who presents with worsening episodic abdominal spasms, loose stools, and hematochezia.     Interval events:   -Colonoscopy done yesterday  -Pain control achieved with Morphine PCA  -Received Tylenol last night for headache    Subjective:  Endorses improving RLQ abdominal pain alleviated with current pain medication. Denies overnight fever/chills, night sweats, nausea/vomiting, chest pain, shortness of breath, palpitations, dysuria or hematuria. Has not passed a stool since colonoscopy done yesterday.     Objective:  Vital Signs:  BP 138/88  Pulse 86  Temp 97.8 F (36.6 C)  Resp 18  Ht 5\' 4"  (1.626 m)  Wt 79.379 kg (175 lb)  BMI 30.04 kg/m2  SpO2 100%  LMP 01/10/2011    Physical Exam:  General: well-appearing woman in no acute distress; sleeping peacefully  HEENT: dry mucous membranes  CV: RRR, no m/r/g  Pulm: CTAB in anterior lung fields, no w/r/r  Abd: soft, non-distended, minimally TTP at RLQ (no TTP above umbilicus, improved compared to yesterday's exam); normoactive bowel sounds today  Ext: no clubbing, edema, or cyanosis    Laboratory data:   Lab Results   Component Value Date    NA 139 05/03/2011    K 3.2* 05/03/2011    CL 100 05/03/2011    BICARB 23 05/03/2011    BUN 3* 05/03/2011    CREAT 0.47* 05/03/2011    GLU 173* 05/03/2011    [place] 9.1 05/03/2011         PHOS  5.2         MG   2.0    Lab Results   Component Value Date    WBC 9.4 05/03/2011    HGB 12.4 05/03/2011    HCT 36.5 05/03/2011    PLT 190 05/03/2011    SEG 67 05/03/2011    LYMPHS 25 05/03/2011    MONOS 5 05/03/2011    EOS 3 05/03/2011     Imaging:  Colonoscopy (05/02/11)- preliminary results:  Predominantly sigmoid inflammation consistent with colon ischemia in setting of  history, and previously elevated lactate. There was no stricture or ulceration seen. Follow up biopsies to help exclude less likely Crohn's colitis.    Microbiology:  BCx (05/01/11): NGTD x 24 hours  UCx (05/01/11): Mixed urethral flora    Assessment and Plan: Ms. Gering is a 45 yo woman with PMH sig for HTN, DM II, retinal hemorrhage, fibroids, tubal ligation, tubular adenoma on recent colonoscopy, and ischemic colitis who presents with worsening episodic abdominal spasms, loose stools, and hematochezia.      #Abdominal pain with hematochezia: Pt was admitted with episodic abdominal spasms with RLQ pain, nausea/vomiting, loose stools, and hematochezia. Colonoscopy was consistent with sigmoid inflammation. Symptoms are improved today. Pain is adequately controlled on current meds. Differential includes ischemic colitis of unknown etiology (possibly 2/2 hypercoagulable state, chronic NSAID use), porphyria, IBD, and IBS. Less likely IBD based on colonoscopy showing no evidence of strictures or ulcerations.   -Continue IV fluids  -Continue Morphine PCA  -Continue Tylenol q4h for T>101.20F  -Ondansetron q6h prn   -Phenergan 25 mg q6h prn   -Appreciate GI recs     -Work up for coagulopathy (antiphospholipid syndrome, Protein C/S  deficiency, Factor V Leiden, DRVVT, ATIII)      -Avoid NSAIDs, hypotension      -Check C. Diff (was not detected on 04/10/11)  -Pending ESR, CRP  -Pending hepatitis panel  -Pending 24-hour urine porphobilinogen, Utox  -Recommended keeping a diary recording episodes of abdominal pain and possible triggers (such as food, exercise, stress)    #DM II: Last A1C 6.6 on 05/02/11. On glipizide 5 mg at home. POCT glucose elevated at 163-217.  -ISS     #HTN: Pt has longstanding hx of poorly controlled HTN and s/p retinal hemorrhage in L-eye 2/2 HTN (BP up to 200/170). BP is ranging from 118-168/77-98 in last 24-hours.   -Currently holding home medications (Lisinopril 40 mg qdaily, Coreg 25 mg, Clonidine 0.3 mg,  Amlodipine 10 mg)  -Will continue to trend blood pressures and holding home meds given that we want to avoid hypotension.     #Insomnia: given last night with adequate relief.  -Zolpidem 5 mg qPM prn    #FEN  -Clear liquid diet, advance as tolerated  -Dietician consult for eval of chronic constipation (consider high-fiber diet with increased fruits and vegetables, low CHO)  -Start bowel regimen (senna 17.2 mg qdaily, colace 250 mg qdaily, miralax)    #PPX  -On SCDs; hold SQ heparin 2/2 hematochezia  -Lansoprazole 30 mg bid     #Code status  -Full code, full care    #Dispo  -Pending improvement of abdominal pain  -Plan to return home to [facility] apartment with significant other    Agree with above excellent note written by Reva Bores, MS3. Reviewed and edited by Barrie Dunker PGY1 [phone]    Staffed with Dr. Lubertha Basque on 05/03/11.  IM Team 5 ATTENDING PROGRESS NOTE ATTESTATION    Subjective    I have reviewed the history.  Interval history:  Much improved since yesterday. Is tolerating pain pump. Colonoscopy showed minimal disease/ulceration of his L colon. Ascending colon not visualized because of stool.    Objective    I have examined the patient and concur with the resident/fellow/NP/PA exam.    Assessment and Plan    I agree with the resident/fellow/NP/PA care plan.    See the resident / fellow note for further details.    I spent 45 minutes of time on the patient care unit reviewing the patient's chart, examining the patient, communicating with the patient's care providers and/or family members and reviewing diagnostic studies.

## 2011-05-03 NOTE — Plan of Care (Signed)
Problem: Alteration in Blood Glucose  Goal: Glucose level within specified parameters  Outcome: Not Met  On npo, fingerstick 163, regular insulin 1 unit given, on ivf at 100 ml/hr, no signs of hypo/hyperglycemia noted, goal ongoing.

## 2011-05-03 NOTE — Plan of Care (Signed)
Problem: Bleeding - Risk of  Goal: Absence of active bleeding  Outcome: Met  No active bleeding noted, patient instructed to call for nurse for any bleeding, also to notify staff of BM    Problem: Pain - Acute  Goal: Communication of presence of pain  Outcome: Met  Patient reports good pain control from Morphine PCA. Patient pain goal of 4/10 met    Problem: Falls - Risk of  Goal: Absence of falls  Outcome: Met  No falls this shift.  Hourly rounds addressing the 6 P's ongoing, room free of clutter, patient remains free from injury at this time.        Problem: Alteration in Blood Glucose  Goal: Glucose level within specified parameters  Outcome: Met  Blood sugar checks Q 6 hrs while NPO. Pt receiving regular insulin per sliding scale should pt's FSG indicate additional coverage. Last FSG 163 covered per orders. Will continue to monitor.           Problem: Discharge Planning  Goal: Participation in care planning  Outcome: Not Met  Awaiting dispo, no discharge orders at this time    Problem: Thromboemboli, Risk of  Goal: Absence of signs and symptoms of Thromboemboli  Outcome: Met  No signs or symptoms of DVT. Skin dry, warm, no redness noted. Patient denies any pain. Pulses intact in all extremities and palpable +2. Patient compliant with active range of motion and early ambulation. Will continue to monitor patient.

## 2011-05-03 NOTE — Plan of Care (Addendum)
Problem: Bleeding - Risk of  Goal: Absence of active bleeding  Monitoring vitals and cardiac monitoring per protocol.  Reviewed that we will need to collect stool for c-diff lab and not to discard stool, plus allows staff to eval for any obvious rectal bleeding.  Pt verbalized understanding.  H/H 12.4/36.5 this morning.  Continued monitoring for bleeding.      Problem: Pain - Acute  Goal: Communication of presence of pain  Outcome: Met  Patient stated 4/10 abd pain on initial assessment. Stated aching quality and persistent.  Pt verbalized 4/10 pain is within tolerable threshold and that PCA Morphine seems to be effective for her pain control.  Pt able to return demonstrate use of PCA.  Continued monitoring pain per protocol.      Problem: Falls - Risk of  Goal: Absence of falls  Outcome: Met  Pt assessed as a moderate fall risk.  Yellow charm placed on ID band.  Reviewed mod fall risk interventions with patient.  Call light w/in reach.  Upper siderails up while in bed.  Hourly rounds continued.  Pt remains free of injury 2/2 falls.      Problem: Alteration in Blood Glucose  Goal: Glucose level within specified parameters  Outcome: Met  FSG checks per ordered protocol.  See doc/flowsheets.  Assessed pt's knowledge r/t diabetes.  Pt verbalized she is now seeing Dr. Anselm Jungling on [address].  States she checks for blood sugars once a day and that "... I should be checking it atleast twice a day, but my blood sugars are pretty good at 150, especially after I eat and take my glipizide..."  Pt states that she understands interventions such as consuming juice if he blood sugars are "too low" and that she would feel the "shakes" if she were to experience s/s hypoglycemia.  Provided a booklet for diabetes at bedside and offered to review topics r/t diabetes.  Pt stated this would be a good review and will try to review points.  Continued monitoring fsg levels per protocol.      Problem: Thromboemboli, Risk of  Goal: Absence of  signs and symptoms of Thromboemboli  Outcome: Met  Assessed for s/s thromboembolism.  Pt observed able to stand with minimal assistance and walk over to bedside commode.  SCD's per protocol.

## 2011-05-03 NOTE — Plan of Care (Signed)
Pt voided per bedside commode and I/O's recorded.  24 hour urine collection completed and is being sent to lab at this time.   Reviewed with patient that we will need one more urine sample for ordered u-tox.

## 2011-05-03 NOTE — Plan of Care (Signed)
Problem: Thromboemboli, Risk of  Goal: Absence of signs and symptoms of Thromboemboli  Applied SCD's per MD order.  Reviewed that SCD's are part of a preventative measure to venous thrombosis.  Pt verbalized understanding.  Tolerating SCD's.

## 2011-05-04 ENCOUNTER — Other Ambulatory Visit (HOSPITAL_BASED_OUTPATIENT_CLINIC_OR_DEPARTMENT_OTHER): Payer: Self-pay | Admitting: Geriatric Medicine

## 2011-05-04 MED ORDER — SODIUM CHLORIDE 0.9 % IV SOLN
4.0000 mg | Freq: Once | INTRAVENOUS | Status: DC
Start: 2011-05-04 — End: 2011-05-04

## 2011-05-04 MED ORDER — CLONIDINE HCL 0.1 MG OR TABS
0.2000 mg | ORAL_TABLET | Freq: Two times a day (BID) | ORAL | Status: DC
Start: 2011-05-04 — End: 2011-05-05
  Administered 2011-05-04: 0.2 mg via ORAL
  Filled 2011-05-04 (×2): qty 2

## 2011-05-04 MED ORDER — PEG 3350-KCL-NABCB-NACL-NASULF 236 GM OR SOLR
4000.0000 mL | Freq: Once | ORAL | Status: AC
Start: 2011-05-04 — End: 2011-05-04
  Filled 2011-05-04: qty 4000

## 2011-05-04 MED ORDER — INSULIN LISPRO (HUMAN) 100 UNIT/ML SC SOLN (CUSTOM)
1.0000 [IU] | Freq: Four times a day (QID) | INTRAMUSCULAR | Status: DC
Start: 2011-05-04 — End: 2011-05-06
  Administered 2011-05-06 (×2): 1 [IU] via SUBCUTANEOUS
  Filled 2011-05-04 (×2): qty 1

## 2011-05-04 MED ORDER — HYDRALAZINE HCL 20 MG/ML IJ SOLN
10.0000 mg | Freq: Four times a day (QID) | INTRAMUSCULAR | Status: DC | PRN
Start: 2011-05-04 — End: 2011-05-06
  Filled 2011-05-04: qty 1

## 2011-05-04 MED ORDER — PROCHLORPERAZINE MALEATE 5 MG OR TABS
5.0000 mg | ORAL_TABLET | Freq: Four times a day (QID) | ORAL | Status: DC | PRN
Start: 2011-05-04 — End: 2011-05-06

## 2011-05-04 MED ORDER — SODIUM CHLORIDE 0.9 % IV SOLN
4.0000 mg | Freq: Once | INTRAVENOUS | Status: DC
Start: 2011-05-04 — End: 2011-05-04
  Filled 2011-05-04: qty 2

## 2011-05-04 MED ORDER — ONDANSETRON HCL 4 MG/2ML IV SOLN
4.0000 mg | Freq: Once | INTRAMUSCULAR | Status: AC
Start: 2011-05-04 — End: 2011-05-05

## 2011-05-04 MED ORDER — AMLODIPINE 5 MG OR TABS
10.0000 mg | ORAL_TABLET | Freq: Every day | ORAL | Status: DC
Start: 2011-05-04 — End: 2011-05-06
  Administered 2011-05-04: 10 mg via ORAL
  Filled 2011-05-04 (×3): qty 2

## 2011-05-04 MED ORDER — LISINOPRIL 40 MG OR TABS
40.0000 mg | ORAL_TABLET | Freq: Every day | ORAL | Status: DC
Start: 2011-05-04 — End: 2011-05-06
  Administered 2011-05-04: 40 mg via ORAL
  Filled 2011-05-04 (×3): qty 1

## 2011-05-04 MED ORDER — SODIUM CHLORIDE 0.9 % IV SOLN
Freq: Once | INTRAVENOUS | Status: AC
Start: 2011-05-04 — End: 2011-05-04

## 2011-05-04 MED ORDER — ATORVASTATIN CALCIUM 20 MG OR TABS
20.0000 mg | ORAL_TABLET | Freq: Every evening | ORAL | Status: DC
Start: 2011-05-04 — End: 2011-05-06
  Filled 2011-05-04 (×3): qty 1

## 2011-05-04 NOTE — Plan of Care (Signed)
Problem: Bleeding - Risk of  Goal: Absence of active bleeding  Outcome: Met  No signs of active bleeding, patient instructed to call staff for any signs of bleeding    Problem: Pain - Acute  Goal: Communication of presence of pain  Outcome: Met  Pt understands pain scale and is able to effectively communicate pain level to RN using numerical scale.           Problem: Falls - Risk of  Goal: Absence of falls  Outcome: Met  No falls this shift  Fall assessment completed, patient a moderate fall risk. Environmental safety assessment complete, room free of clutter, fall charm on, hourly rounds addressing the 6 P's ongoing        Problem: Alteration in Blood Glucose  Goal: Glucose level within specified parameters  Outcome: Met  Blood sugar checks Q Q6hrs and PRN. Pt taking regular insulin per sliding scale should pt's FSG indicate additional coverage. Last FSG 126 mg/dl no coverage needed per orders. Will continue to monitor.           Problem: Discharge Planning  Goal: Participation in care planning  Outcome: Not Met  Awaiting dispo, no discharge orders at this time. Pending improvement of abdominal pain        Problem: Thromboemboli, Risk of  Goal: Absence of signs and symptoms of Thromboemboli  Outcome: Met  No signs or symptoms of DVT. Skin dry, warm, no redness noted. Patient denies any pain. Pulses intact in all extremities and palpable +2. Patient compliant with active range of motion and early ambulation. SCDs on both legs. Will continue to monitor patient.

## 2011-05-04 NOTE — Plan of Care (Signed)
Problem: Bleeding - Risk of  Goal: Absence of active bleeding  Intervention: Active bleeding signs and symptoms assessment   Latest H/H from 9/22 is 12.4/36.5.  Currently no BM thus far.  Pt is non-hypotensive and non-tachycardic.  Will continue to monitor labs and vitals for acute bleed.  Will check stool once available for any rectal bleed.        Problem: Pain - Acute  Goal: Communication of presence of pain  Outcome: Not Met  Assessed pain level on initial assessment and per protocol.  Reviewed pain medication regimen with patient and allowed pt to verbalize needs/concerns.  Pt verbalized understanding to PCA regimen and states 5/10 L abd discomfort.  States this is still tolerable and is trying to balance pain with her nausea symptoms.  Will administer anti-emetics as ordered.  Will continue to monitor pain levels.      Problem: Falls - Risk of  Goal: Absence of falls  Outcome: Met  Reviewed moderate fall risk precautions with patient.   Call light w/in reach.  Upper siderails up while in bed.  Nonskid footwear per protocol.  Hourly rounds continued.  Pt remains free of injury 2/2 falls.      Problem: Alteration in Blood Glucose  Goal: Glucose level within specified parameters  Outcome: Met  Monitoring fsg levels per protocol.  Pt observed reading diabetic literature for review, provided yesterday by this nurse.  Pt verbalized understanding to s/s hypoglycemia and interventions.  Will continue to monitor fsg levels as ordered.      Problem: Thromboemboli, Risk of  Goal: Absence of signs and symptoms of Thromboemboli  Outcome: Met  Monitoring for s/s thromboembolism.  SCD's as ordered.  No observed homan's sign.  Tolerating SCD's thus far.

## 2011-05-04 NOTE — Plan of Care (Signed)
Problem: Tissue Perfusion - Cardiopulmonary, Altered  Goal: Early recognition of deterioration  Outcome: Not Met  Text paged primary team several times regarding hypertensiv trends.  See provider notification times.  Performed repeat vitals almost hourly and was given dose of amlodipine at 1247 and clonidine at 1540.  Currently remains hypertensive.  Stats headache discomfort resolved at this time.  Suggested hydralizine if applicable.  Continued monitoring hypertensive trends.  See vitals.

## 2011-05-04 NOTE — Progress Notes (Signed)
IM Attending Daily Progress Note:  05/04/2011     Current Hospital Stay:   3 days - Admitted on: 05/01/2011    ID: Katherine Church is a 45 yo woman with PMH sig for HTN, DM II, retinal hemorrhage, fibroids, tubal ligation, tubular adenoma on recent colonoscopy, and ischemic colitis (dx in 2011) who presents with worsening episodic abdominal spasms, loose stools, and hematochezia.       Subjective:  Endorses improving RLQ abdominal pain alleviated with current pain medication. Denies overnight fever/chills, night sweats, nausea/vomiting, chest pain, shortness of breath, palpitations, dysuria or hematuria. Has not passed a stool yet.   Reports having taken 5 hour energy drink a couple of times and wonders whether this could be a factor in her new symptoms.     Objective:  Vital Signs:  BP 148/84  Pulse 91  Temp 97.9 F (36.6 C)  Resp 25  Ht 5\' 4"  (1.626 m)  Wt 79.379 kg (175 lb)  BMI 30.04 kg/m2  SpO2 98%  LMP 01/10/2011    Physical Exam:  General: well-appearing woman in no acute distress; sleeping peacefully  HEENT: dry mucous membranes  CV: RRR, no m/r/g  Pulm: CTAB in anterior lung fields, no w/r/r  Abd: soft, non-distended, minimally TTP at RLQ; rare but normally sounding bowel sounds today  Ext: no clubbing, edema, or cyanosis    Laboratory data:   Lab Results   Component Value Date    NA 141 05/04/2011    K 3.5 05/04/2011    CL 104 05/04/2011    BICARB 24 05/04/2011    BUN 3* 05/04/2011    CREAT 0.45* 05/04/2011    GLU 146* 05/04/2011    [place] 9.6 05/04/2011            Lab Results   Component Value Date    WBC 9.4 05/03/2011    HGB 12.4 05/03/2011    HCT 36.5 05/03/2011    PLT 190 05/03/2011    SEG 67 05/03/2011    LYMPHS 25 05/03/2011    MONOS 5 05/03/2011    EOS 3 05/03/2011     Imaging:  Colonoscopy (05/02/11)- preliminary results:  Predominantly sigmoid inflammation consistent with colon ischemia in setting of history, and previously elevated lactate. There was no stricture or ulceration seen. Follow up biopsies to help  exclude less likely Crohn's colitis.    Microbiology:  BCx (05/01/11): NGTD x 24 hours  UCx (05/01/11): Mixed urethral flora    Assessment and Plan: Katherine Church is a 45 yo woman with PMH sig for HTN, DM II, retinal hemorrhage, fibroids, tubal ligation, tubular adenoma on recent colonoscopy, and ischemic colitis who presents with worsening episodic abdominal spasms, loose stools, and hematochezia.      #Abdominal pain with hematochezia: Pt was admitted with episodic abdominal spasms with RLQ pain, nausea/vomiting, loose stools, and hematochezia. Colonoscopy was consistent with sigmoid inflammation. Symptoms are improved today. Pain is adequately controlled on current meds. Differential includes ischemic colitis of unknown etiology (possibly 2/2 hypercoagulable state, chronic NSAID use), porphyria, IBD, and IBS. Less likely IBD based on colonoscopy showing no evidence of strictures or ulcerations.   2. Constipation  Continue hydration po, will continue PEG, try weaning off Morphine PCA, encourage fluids  -Continue Tylenol q4h for T>101.37F  -Ondansetron q6h prn   -Phenergan 25 mg q6h prn   -Appreciate GI recs     -Work up for coagulopathy (antiphospholipid syndrome, Protein C/S deficiency, Factor V Leiden, DRVVT, ATIII)      -  Avoid NSAIDs, hypotension, avoid 5h energy drink.     -Check C. Diff (was not detected on 04/10/11)  -Pending ESR, CRP  -Pending hepatitis panel  -Pending 24-hour urine porphobilinogen, Utox  -Recommended keeping a diary recording episodes of abdominal pain and possible triggers (such as food, exercise, stress)    #DM II: Last A1C 6.6 on 05/02/11. On glipizide 5 mg at home. POCT glucose elevated at 163-217.  -ISS     #HTN: Pt has longstanding hx of poorly controlled HTN and s/p retinal hemorrhage in L-eye 2/2 HTN (BP up to 200/170). BP is ranging from 118-168/77-98 in last 24-hours.   -Currently holding home medications (Lisinopril 40 mg qdaily, Coreg 25 mg, Clonidine 0.3 mg, Amlodipine 10 mg)  -Will  continue to trend blood pressures and holding home meds given that we want to avoid hypotension.     #Insomnia: given last night with adequate relief.  -Zolpidem 5 mg qPM prn    #FEN  -Clear liquid diet, advance as tolerated  -Dietician consult for eval of chronic constipation (consider high-fiber diet with increased fruits and vegetables, low CHO)  -Start bowel regimen (senna 17.2 mg qdaily, colace 250 mg qdaily, miralax)    #PPX  -On SCDs; hold SQ heparin 2/2 hematochezia  -Lansoprazole 30 mg bid     #Code status  -Full code, full care    #Dispo  -Pending improvement of abdominal pain      I spent 45 minutes of time on the patient care unit reviewing the patient's chart, examining the patient, communicating with the patient's care providers and/or family members and reviewing diagnostic studies.

## 2011-05-05 MED ORDER — HYDROCODONE-ACETAMINOPHEN 5-500 MG OR TABS
1.0000 | ORAL_TABLET | Freq: Four times a day (QID) | ORAL | Status: DC | PRN
Start: 2011-05-05 — End: 2011-05-06
  Administered 2011-05-05: 1 via ORAL
  Filled 2011-05-05: qty 1

## 2011-05-05 MED ORDER — CARVEDILOL 25 MG OR TABS
25.0000 mg | ORAL_TABLET | Freq: Once | ORAL | Status: AC
Start: 2011-05-05 — End: 2011-05-05
  Filled 2011-05-05: qty 1

## 2011-05-05 MED ORDER — MORPHINE SULFATE 15 MG OR TABS
15.0000 mg | ORAL_TABLET | ORAL | Status: DC | PRN
Start: 2011-05-05 — End: 2011-05-06
  Administered 2011-05-05 – 2011-05-06 (×8): 15 mg via ORAL
  Filled 2011-05-05 (×9): qty 1

## 2011-05-05 MED ORDER — CLONIDINE HCL 0.1 MG OR TABS
0.3000 mg | ORAL_TABLET | Freq: Two times a day (BID) | ORAL | Status: DC
Start: 2011-05-05 — End: 2011-05-06
  Administered 2011-05-05 – 2011-05-06 (×2): 0.3 mg via ORAL
  Filled 2011-05-05 (×3): qty 3

## 2011-05-05 MED ORDER — CARVEDILOL 25 MG OR TABS
25.0000 mg | ORAL_TABLET | Freq: Two times a day (BID) | ORAL | Status: DC
Start: 2011-05-05 — End: 2011-05-06
  Administered 2011-05-05 – 2011-05-06 (×3): 25 mg via ORAL
  Filled 2011-05-05 (×3): qty 1

## 2011-05-05 MED ORDER — METOCLOPRAMIDE HCL 10 MG OR TABS
10.0000 mg | ORAL_TABLET | Freq: Three times a day (TID) | ORAL | Status: DC
Start: 2011-05-05 — End: 2011-05-06
  Administered 2011-05-05 – 2011-05-06 (×4): 10 mg via ORAL
  Filled 2011-05-05 (×5): qty 1

## 2011-05-05 NOTE — Progress Notes (Signed)
Gastroenterololgy/Hepatology Consultation Progress note:      ON: No issues    CC/Subjective: Pt denied nausea or vomiting. Her Abd pain is better.          Current Problem List:   Patient Active Problem List   Diagnoses Date Noted   . Tubular adenoma of rectum 04/26/2011      During colonoscopy (03/2011) a biopsy of rectosigmoid region revealed a tubular adenoma. She was advised to follow up with GI for a repeat colonoscopy in 3-5 years.       . Myofascial pain 10/07/2010   . Piriformis syndrome 10/07/2010   . Screening for breast cancer 03/02/2010   . Screening for malignant neoplasm of breast 09/14/2009   . Acute ischemic colitis 09/08/2009   . Abdominal pain 09/02/2009   . Hypertension, malignant 05/22/2009   . Retinal hemorrhage 05/22/2009   . Diabetes 05/18/2009   . Hypertension 05/18/2009   . Chronic back pain 05/18/2009   . Anxiety 05/18/2009       Past Medical History:  Past Medical History   Diagnosis Date   . Back pain    . High blood pressure    . Diabetes mellitus    . Hyperlipidemia LDL goal < 100    . Colitis, ulcerative        Past Surgical History:  Past Surgical History   Procedure Date   . Anesth,tubal ligation/transection 1994   . Colonoscopy 04/2011     Repeat surveillance colo in 2019         Social History:  History     Social History   . Marital Status: Single     Spouse Name: N/A     Number of Children: 2   . Years of Education: N/A     Occupational History   . unemployed      Social History Main Topics   . Smoking status: Never Smoker    . Smokeless tobacco: Never Used   . Alcohol Use: No   . Drug Use: No      remote history of cocaine.  Currently occasional MJ smoking   . Sexually Active: Yes -- Female partner(s)      tubal ligation     Other Topics Concern   . Blood Transfusions No   . Special Diet No   . Exercise Yes     tries to walk     Social History Narrative    Two kids-adult in [place] (going thru med school [place]); son still deciding.Born and raised in [place].Moved to [facility] in august  2010-boyfriend works for [employer].       Family History:  Family History   Problem Relation Age of Onset   . Diabetes Father    . Lipids Father    . Hypertension Father    . Hypertension Mother        Allergy:  Allergies   Allergen Reactions   . Ciprofloxacin Rash   . Flagyl (Metronidazole Hcl) Rash   . Tetanus Toxoids Swelling       Current Medications:  Current Facility-Administered Medications   Medication   . cloNIDine (CATAPRES) tablet 0.3 mg   . HYDROcodone-acetaminophen (VICODIN) 5-500 MG tablet 1 tablet   . morphine (MSIR) tablet 15 mg   . metoclopramide (REGLAN) tablet 10 mg   . carvedilol (COREG) tablet 25 mg   . carvedilol (COREG) tablet 25 mg   . lisinopril (PRINIVIL, ZESTRIL) tablet 40 mg   . atorvastatin (LIPITOR)  tablet 20 mg   . amLODIPINE (NORVASC) tablet 10 mg   . insulin lispro (HUMALOG) injection 1-10 Units   . prochlorperazine (COMPAZINE) tablet 5 mg   . ondansetron (ZOFRAN) injection 4 mg   . hydrALAZINE (APRESOLINE) injection 10 mg   . DISCONTD: cloNIDine (CATAPRES) tablet 0.2 mg   . sodium chloride 0.9% infusion   . LORazepam (ATIVAN) tablet 1 mg   . docusate sodium (COLACE) capsule 250 mg   . senna (SENOKOT) tablet 17.2 mg   . polyethylene glycol (MIRALAX) packet 17 g   . naloxone (NARCAN) injection 0.1 mg   . zolpidem (AMBIEN) tablet 5 mg   . DISCONTD: morphine 1 mg/mL PCA   . DISCONTD: NARCOTIC DRIP (FOR PYXIS)   . lansoprazole (PREVACID) DR capsule 30 mg   . sodium chloride 0.9 % flush 3 mL   . sodium chloride 0.9 % flush 3 mL   . sodium chloride 0.9% infusion   . acetaminophen (TYLENOL) tablet 650 mg   . ondansetron (ZOFRAN) injection 4 mg   . promethazine (PHENERGAN) tablet 12.5 mg   . glucose chewable tablet 16 g   . glucose 40% oral gel 1 Tube   . dextrose 50 % solution 12.5 g   . glucagon (GLUCAGON) injection 1 mg       Review of Systems:    Constitutional: weak  Eyes: Negative  HENT: Negative  Cardiovascular: Negative  Respiratory: Negative  Gastrointestinal: abdominal  pain  Genitourinary: Negative      Physical examination:  BP 150/93  Pulse 70  Temp 98.5 F (36.9 C)  Resp 16  Ht 5\' 4"  (1.626 m)  Wt 79.379 kg (175 lb)  BMI 30.04 kg/m2  SpO2 98%  LMP 01/10/2011 Body mass index is 30.04 kg/(m^2).  Wt Readings from Last 2 Encounters:   05/01/11 79.379 kg (175 lb)   04/24/11 79.379 kg (175 lb)    Blood Pressure   05/05/11 150/93   04/24/11 108/72     Temperature:  [98 F (36.7 C)-98.8 F (37.1 C)] 98.5 F (36.9 C) (09/24 1131)  Blood pressure (BP): (133-157)/(77-105) 150/93 mmHg (09/24 1700)  Heart Rate:  [70-90] 70  (09/24 1700)  Respirations:  [14-20] 16  (09/24 1700)  Pain Score:  [-] 4 (09/24 1700)  O2 Device:  [-] None (Room air) (09/24 1700)  SpO2:  [94 %-99 %] 98 % (09/24 1700)    General: NAD, comfortable  Affect: Normal, pleasant  HEENT: EOMI, not icteric, PERRL, intact hearing, oropharynx clear  Cardiovascular: RRR, no edema  Pulm: Clear to auscultation bilaterally, no wheezes, rales  Gastrointestinal: Bowel sounds present.  Soft, Diffusely tender around the umbilical and right lower quadrant but there is no guarding or rigidity. non-distended, no hepatomegaly  Extremities: No peripheral edema. Warm well-perfused  Rectal Exam: There was small dry blood present in rectum.    Labs:    Lab Results   Component Value Date    WBC 10.8 05/05/2011    RBC 4.58 05/05/2011    HGB 12.8 05/05/2011    HCT 37.7 05/05/2011    MCV 82.3 05/05/2011    MCHC 34.0 05/05/2011    RDW 13.2 05/05/2011    PLT 192 05/05/2011    PLT 219 05/21/2009    MPV 9.3 05/05/2011       Chemistry  Lab Results   Component Value Date    NA 141 05/05/2011    K 3.8 05/05/2011    CL 105 05/05/2011    BICARB  24 05/05/2011    BUN 3 05/05/2011    CREAT 0.42 05/05/2011    GLU 143 05/05/2011     Liver  Lab Results   Component Value Date    TBILI 0.4 05/05/2011    DBILI 0.1 05/05/2011    AST 18 05/05/2011    ALT 28 05/05/2011    ALK 70 05/05/2011    ALB 4.3 05/05/2011     Coags  Lab Results   Component Value Date    INR 1.1 05/02/2011     PTT 18.3 05/01/2011   Lactic acid: 32.6  Lipase: 27    Procedures/Images:   Colonoscopy 09/03/10  Scope was advanced to distal transverse colon only given extensive areas of ischemic colitis and risk of  perforation. Patchy areas of ulcerated colonic mucosa with inflammation and edema in the sigmoid colon and splenic  flexure, which became confluent areas of severely ulcerated, inflammed, edematous colonic mucosa with  multiple dusky areas in the descending colon, concerning for patchy necrosis. Cannot rule out dead bowel. Multiple  biopsies taken from these areas. Rectum and distal transverse colon appeared relatively spared.     Pathology: Mucosal necrosis with fibrinopurulent exudate        Colonoscopy: 04/10/11   - Normal appearing mucosa in left colon mostly (although stool covering mucosa, once cleared with irrigation, appeared healthy underneath) except splenic flexture area (35 cm-40 cm proximal to anal verge) where there is erythema, also diverticula observed in this area.   - Slight rectal erythema  - Right colon mucosa appears healthy (see above for extent of exam)  EGD:   - Normal esophagus except GE junction that had erythema and slight nodularity s/p biopsies   (likely from N/V)   - Slight gastric erythema but no ulcers, no old or fresh blood seen. Biopsies taken   - Normal appearing duodenum, s/p biopsies           Pathology 04/10/11   A: Stomach, biopsy   -Gastric mucosa with no significant histopathology.   -No Helicobacter organisms identified.   B: Small bowel, biopsy   -Small intestinal mucosa with no significant histopathology.   C: Gastroesophageal junction, biopsy   -Gastroesophageal junction with foveolar hyperplasia and reactive   changes of gastric cardia.   D: Left colon, random, biopsy   -Colonic mucosa with minimal lamina propria fibrosis and no other   significant histopathology.   E: Colon, right, biopsy   -Colonic mucosa with no significant histopathology.   F: Rectum, biopsy    -Rectal mucosa with minimal lamina propria fibrosis and no other   significant histopathology.     MR enterography 09/02:   Findings:    Adequate distention of the proximal bowel obtained. Distal small bowel was   poorly distended.    The small bowel and proximal large bowel are unremarkable, without evidence of  mural thickening, stenoses, fistulae or focal dilatation. Symmetric mural   enhancement is identified in the small bowel and proximal large bowel, without  evidence of mucosal hyperenhancement or engorgement of the vasa recta.    Within the proximal sigmoid colon, there is apparent hyperenhancement of the   mucosa. No pericolonic inflammatory change is noted.    Mildly prominent right iliac chain and inguinal lymph nodes are noted   measuring up to 9 mm. No other suspicious adenopathy is identified.    Remainder of the visualized solid viscera are unremarkable.      Impression:    1. Hyperenhancement of the mucosa  in the proximal sigmoid colon which may be   on the basis of decompression of this segment of bowel versus mucosal   inflammation.  2. Prominent right iliac chain and inguinal lymph nodes. Recommend   correlation with right lower extremity inflammation.         Colonoscopy 04/16/11   Findings: - There were few, focal superficial small ulcers in   the rectum, rectosigmoid junction, distal sigmoid colon and   splenic flexure with normal appearing mucosa in-between   these segments; multiple biopsies are taken.   - 2 mm sessile polyp in the recto-sigmoid junction; removed   by cold excisional biopsy.   - Otherwise normal colon to the terminal ileum. Multiple   random biopsies are taken from terminal ileum, right colon   and left colon and placed in separate jars to evaluate for   the underlying chronic inflammation.     Pathology:  FINAL PATHOLOGIC DIAGNOSIS:   A: Terminal ileum, biopsy   -Benign small intestinal mucosa with no specific pathologic   abnormality.   B: Right colon, biopsy   -Benign  colonic mucosa with mild architectural alterations.   C: Splenic flexure of colon, lesions, biopsy   -Benign colonic mucosa with mild architectural alterations.   D: Random left colon, biopsy   -Benign colonic mucosa with no significant pathologic abnormality.   E: Rectosigmoid lesions, biopsy   -Benign colonic mucosa with focal mild lamina propria fibrosis.   F: Rectosigmoid polyps, biopsy   -Tubular adenoma.   G: Rectal lesion, biopsy   -Focal ulceration with reparative changes.      A/P:       Katherine Church is a 45 year old female with hx of DM, HTN and episode of ischemic colitis in 2011, presenting with abdominal pain more in right lower quadrant and hypogastric region and it was associated with vomiting in the morning and bright red blood per rectum.    Impression:  In her recent  hospitalization she has had EGD/ Colonoscopy and results are mentioned above. Biopsy did not show any e/o IBD. Her stool was positive for WBC in her previous hospitalization but C diff and stool for ova and parasite was negative. Her MR  Enterography on 04/13/11 showed showed Hyperenhancement of the mucosa in the proximal sigmoid colon which may be on the basis of decompression of this segment of bowel versus mucosal inflammation. She had no growth of organism in her stool culture previously. Repeat colonoscopy 04/16/11 showed focal superficial small ulcers in the rectum, rectosigmoid junction, distal sigmoid colon and splenic flexure with normal appearing mucosa in-between these segments. 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 Biopsies showed some lamina propria fibrosis and focal ulceration with reparative changes in the rectum.  She is presented again with BRBPR. She did feel lightheaded but her Hb is 13.3 which is her baseline. Her chemistry panel did not show significant acidosis although her lactic acid is 32.6 and her WBC is 13 K. Given her history of  ischemic colitis, it is possible that she has bowel ischemia again given her lactate and WBC. Infectious etiology can also be possible but her previous work was negative for C diff.  She is no longer using NSAID and we expect her NSAID induced colopathy should be getting better. At this time, it is imperative to rule out bowel ischemia. She has colonoscopy which showed Predominantly sigmoid inflammation  consistent with colon ischemia in  setting of history, and previously elevated lactate. There was no stricture or ulceration seen.  Her colon ischemia is of unclear etiology. It may be possible that she drop her BP when she walked prior to admission.        Recommendations:    -- work up pending for coagulopathy like antiphospholipid syndrome, Protein C/S deficiency.   -- Avoid NSAIDS   -- Avoid hypotension   - analgesia, keep well hydrate IV fluids.   -- C diff is neg   - Agree with diet.  -- Avoid heavy exercise.  -- Follow up in GI clinic.  -- Will sign off for now. Please call us if you have any questions.          Patient  was seen and discussed with Dr.  Mliss Fritz      Electronically Signed by:  Robbie Louis  Gastroenterology/Hepatology Fellow    Attending Note:  Cc: abdominal pain    Subjective:  I reviewed the history.  Patient interviewed and examined.  Pertinent notes from History of present illness (HPI) include  HPI:  Hx 8 hour energy drink prior to epsidode of pain.  Much improved now, tolerating diet    Review of Systems (ROS): As per the fellow's note.  Past Medical, Family, Social History:  As per the fellow's  note.    Objective:   I have examined the patient and I concur with the fellow's exam.  Abnormalities on exam:   Standing, comfortable  Abdomen: soft, nt/nd, +BS  Assessment and plan reviewed with the fellow. I agree with the fellow's plan as documented.    Impression/Report/Plan:    Impression: :   1. Ischemic colitis(most likely) unclear etiology      Recommendations:  1. F/u biopsy  results  2. Avoid energy drinks with caffeine or epinephrine derivates  3. F/u in GI clinic with Dr. Ilda Foil  And as per fellow    See the fellow's note for further details.    Catheline Hixon G. Mliss Fritz MD  Division of Gastroenterology

## 2011-05-05 NOTE — Progress Notes (Addendum)
R1/MS3 Daily Progress Note:  05/05/2011     Current Hospital Stay:   4 days - Admitted on: 05/01/2011    ID: Ms. Skeel is a 45 yo woman with PMH sig for HTN, DM II, retinal hemorrhage, fibroids, tubal ligation, tubular adenoma on recent colonoscopy, and ischemic colitis (dx in 2011) who presents with worsening episodic abdominal spasms, loose stools, and hematochezia.     Interval events:  -Pt stooled around 1900 after taking PEG   -Elevated BP (max 188/113 at 1726) despite being back on home antihypertensive meds (amlodipine, clonidine, and lisinopril)    Subjective:  Pt is concerned about persistently elevated BP with associated HA's, throbbing behind her eyes, and palpitations. Endorses improved RLQ abdominal pain, but has some nausea that she attributes to pain medication. Denies fever/chills, chest pain, shortness of breath, or dysuria. Had some hot flashes yesterday evening. Had one loose, green stool 2/2 PEG (golytely) around 1900.    Objective:  Vital Signs:  BP 143/86  Pulse 78  Temp 98.6 F (37 C)  Resp 18  Ht 5\' 4"  (1.626 m)  Wt 79.379 kg (175 lb)  BMI 30.04 kg/m2  SpO2 99%  LMP 01/10/2011    Intake/Output (Current Shift):  09/23 1800 - 09/24 0559  In: 2180 [P.O.:480; I.V.:1700]  Out: 1501 [Urine:1500]    Physical Exam:  General: well-appearing woman in no acute distress, sleeping peacefully  HEENT: moist mucous membranes, EOMI, PERRLA  CV: RRR, no m/r/g  Pulm: CTAB in anterior and posterior fields, no w/r/r  Abd: soft, non-distended, mildly TTP in RLQ and to the right epigastric region; normoactive bowel sounds  Skin: no evidence of clubbing, cyanosis, or edema  Ext: warm and well perfused    Laboratory data:   Lab Results   Component Value Date    NA 141 05/05/2011    K 3.8 05/05/2011    CL 105 05/05/2011    BICARB 24 05/05/2011    BUN 3* 05/05/2011    CREAT 0.42* 05/05/2011    GLU 143* 05/05/2011    [place] 9.5 05/05/2011       MG   2.1    Lab Results   Component Value Date    WBC 10.8* 05/05/2011    HGB  12.8 05/05/2011    HCT 37.7 05/05/2011    PLT 192 05/05/2011    SEG 69 05/05/2011    LYMPHS 23 05/05/2011    MONOS 4* 05/05/2011    EOS 3 05/05/2011     Lab Results   Component Value Date    AST 18 05/05/2011    ALT 28 05/05/2011    ALK 70 05/05/2011    TBILI 0.4 05/05/2011    DBILI 0.1 05/05/2011    TP 7.8 05/05/2011    ALB 4.3 05/05/2011     Urine toxicology (05/03/11): benzodiazepine-positive, all others negative    Microbiology:  BCx (05/01/11): NGTD x72 hours  MRSA nares (05/01/11): negative  C.diff (05/04/11): pending    Assessment and Plan: Ms. Naegele is a 45 yo woman with PMH sig for HTN, DM II, retinal hemorrhage, fibroids, tubal ligation, tubular adenoma on recent colonoscopy, and ischemic colitis who presents with worsening episodic abdominal spasms, loose stools, and hematochezia.     #Abdominal pain with hematochezia: Pain is adequately controlled on current meds (including morphine PCA). Pt complains of some nausea 2/2 pain meds. Pending labs to assess differential for pt's intermittent abdominal pain.   -Coagulopathy panel (antiphospholipid syndrome, Protein C/S deficiency, Factor V Leiden,  DRVVT, ATIII) pending  -Hepatitis panel pending  -24-hour urine porphobilinogen pending  -C. Diff pending  -Discontinue morphine PCA  -Start morphine (MSIR)15 mg and vicodin q6hr PRN  -Ondansetron and phenergan q6h prn; encouraged to take antinausea meds prior to pain med administration  -Avoid NSAIDs, hypotension, avoid 5h energy drink.     #Constipation: pt had loose, green stool last night 2/2 PEG. Felt mild improvement in abdominal pain, but still feels some bowel impaction.   -Start metoclopramide qdaily to improve bowel motility  -Continue bowel regimen (senna 17.2 mg qdaily, colace 250 mg qdaily, miralax)   - as above, d/c morphine PCA and wean narcotics that might be slowing motility  -encourage PO hydration     #DM II: POCT glucose stable at 136-140.   -ISS     #HTN: BP is significantly elevated from 133-188/77-116 in last  24-hours, given one dose IV hydralazine  -Continue Lisinopril 40 mg qdaily, Clonidine 0.3 mg, Amlodipine 10 mg  -Add Coreg 25 mg BID (home dose) today  - IV hydralazine PRN    #Hyperlipidemia: stable  -Lipitor 20 mg qdaily    #Insomnia:   -Zolpidem 5 mg qPM prn     #FEN/GI   -Carb limited   -Dietician consult for eval of chronic constipation (consider high-fiber diet with increased fruits and vegetables, low CHO)     #PPX   -On SCDs; hold SQ heparin 2/2 hematochezia   -Lansoprazole 30 mg bid     #Code status   -Full code, full care     #Dispo   -Pending improvement of abdominal pain    Agree with above excellent note written by Reva Bores, MS3. Reviewed and edited by Barrie Dunker PGY1 [phone].     Staffed with Rodena Medin, MD on 05/05/11.      IM TEam 5 ATTENDING PROGRESS NOTE ATTESTATION    Subjective    I have reviewed the history.  Interval history:  Has decreased pain in RQ on morphine. Is tolerating food.     Objective    I have examined the patient and concur with the resident/fellow/NP/PA exam.    Assessment and Plan    I agree with the resident/fellow/NP/PA care plan.    See the resident / fellow note for further details.    I spent 45 minutes of time on the patient care unit reviewing the patient's chart, examining the patient, communicating with the patient's care providers and/or family members and reviewing diagnostic studies.

## 2011-05-05 NOTE — Plan of Care (Signed)
Problem: Bleeding - Risk of  Goal: Absence of active bleeding  Outcome: Met  Pt is alert and oriented x4. Denies any bloody stool at this time. Clear yellow urine. BP has been high, pt was re-started on her BP meds. Cont to monitor pt, vs, tele and for s/s of bleeding.    Problem: Pain - Acute  Goal: Communication of presence of pain  Outcome: Met  Pt is alert and oriented x4. Complains of mostly 4/10 abdominal pain. PCA was dc'd, pt was given PRN med and educated on pain mgmt options. Administer meds as ordered. Cont to monitor pt, vs, tele and re-assess pain.    Problem: Falls - Risk of  Goal: Absence of falls  Outcome: Met  Pt is alert and oriented x4. Able to transfer from bed to commode independently. Bed in lowest position, call light and bedside table within reach, educated pt on calling when needing any asssistance. Hourly rounding and non slip socks in place. Side rails up x2. Cont to monitor for safety.    Problem: Alteration in Blood Glucose  Goal: Glucose level within specified parameters  Outcome: Met  Pt is alert and oriented x4. Blood sugar checked and insulin administered as ordered. Cont to monitor pt, vs, tele and for s/s of hypo/hyperglycemia.    Problem: Thromboemboli, Risk of  Goal: Absence of signs and symptoms of Thromboemboli  Outcome: Met  Pt refuses venodynes at this time. Denies any lower extremity pain, educated on DVT prevention. Pt is able to ambulate but is connected to heart monitor and IV tubing. Able to transfer from bed to commode independently. Encourage increase in activity as tolerated. Cont to monitor.    Problem: Tissue Perfusion - Cardiopulmonary, Altered  Goal: Early recognition of deterioration  Outcome: Met  Pt denies chest pain or SOB. On room air, sating at least 95%. BP is high but pt has been re-started on BP meds last night. SR on tele, no ectopy noted. Administer meds as ordered. Cont to monitor pt, vs, tele and re-assess pt.

## 2011-05-05 NOTE — Plan of Care (Signed)
Problem: Bleeding - Risk of  Goal: Absence of active bleeding  Outcome: Met  Patient with no episodes of rectal bleeding overnight. H&H stable. BP and HR WNL overnight. No active bleeding noted throughout shift.     Problem: Pain - Acute  Goal: Communication of presence of pain  Outcome: Met  Patient able to communicate pain to RN using verbal pain scale. Patient complains of constant abdominal cramping, tolerating pain with IV Morphine PCA overnight. Patient scores her pain @ 4/10 overnight, able to rest comfortably. Patient did however complain of severe headache, given Tylenol as per PRN order, hot packs provided with good relief. Pain assessed with VS q 4 hrs overnight and more frequently as needed. Patient understands how to use PCA, tolerating pain with good relief from PCA @ this time. Will monitor.     Problem: Falls - Risk of  Goal: Absence of falls  Outcome: Met  Patient is a low fall risk, able to ambulate to commode independently overnight with steady gait. Bed in low and locked position throughout shift, call bell within reach @ all times. Fall precautions maintained, no falls occurred throughout shift.     Problem: Alteration in Blood Glucose  Goal: Glucose level within specified parameters  Outcome: Met  Glucose 136 @ bedtime, no insulin coverage required. Glucose monitored AC/HS as ordered, sliding scale to be administered as needed. Patient understands signs/symptoms of hypoglycemia and what to report to RN.     Problem: Discharge Planning  Goal: Participation in care planning  Outcome: Met  Patient continues to be updated re: plan of care on hourly rounds overnight, verbalized understanding. Plan is to continue IVF and pain control, will advance diet as tolerated. Stool sample sent and pending @ this time. No discharge plans currently.     Problem: Thromboemboli, Risk of  Goal: Absence of signs and symptoms of Thromboemboli  Outcome: Met  Patient denies any calf pain, no sudden SOB with  inspiration. Patient ambulatory to commode frequently r/t GoLytely administration, SCDs removed overnight to make it easier for patient to get in and out of bed. No signs/symtoms of DVT @ this time.

## 2011-05-06 ENCOUNTER — Other Ambulatory Visit (HOSPITAL_BASED_OUTPATIENT_CLINIC_OR_DEPARTMENT_OTHER): Payer: Self-pay | Admitting: Geriatric Medicine

## 2011-05-06 LAB — BLOOD CULTURE
Blood Culture: NO GROWTH
Blood Culture: NO GROWTH

## 2011-05-06 MED ORDER — HYDROCODONE-ACETAMINOPHEN 7.5-750 MG OR TABS
1.0000 | ORAL_TABLET | Freq: Four times a day (QID) | ORAL | Status: DC | PRN
Start: 2011-05-06 — End: 2011-05-09

## 2011-05-06 MED ORDER — POLYETHYLENE GLYCOL 3350 OR PACK
17.0000 g | PACK | Freq: Every day | ORAL | Status: DC
Start: 2011-05-06 — End: 2011-05-06

## 2011-05-06 MED ORDER — MORPHINE SULFATE 15 MG OR TABS
15.0000 mg | ORAL_TABLET | ORAL | Status: DC | PRN
Start: 2011-05-06 — End: 2011-05-06

## 2011-05-06 MED ORDER — ATORVASTATIN CALCIUM 20 MG OR TABS
20.0000 mg | ORAL_TABLET | Freq: Every day | ORAL | Status: DC
Start: 2011-05-06 — End: 2011-05-12

## 2011-05-06 MED ORDER — METOCLOPRAMIDE HCL 10 MG OR TABS
10.0000 mg | ORAL_TABLET | Freq: Three times a day (TID) | ORAL | Status: DC
Start: 2011-05-06 — End: 2011-05-06

## 2011-05-06 MED ORDER — POLYETHYLENE GLYCOL 3350 OR POWD
17.0000 g | Freq: Every day | ORAL | Status: DC
Start: 2011-05-06 — End: 2011-11-03

## 2011-05-06 MED ORDER — PNEUMOCOCCAL VAC POLYVALENT 25 MCG/0.5ML IJ INJ (CUSTOM)
0.5000 mL | INJECTION | INTRAMUSCULAR | Status: DC
Start: 2011-05-06 — End: 2011-05-06

## 2011-05-06 MED ORDER — SENNA 8.6 MG OR TABS
8.6000 mg | ORAL_TABLET | Freq: Every day | ORAL | Status: AC
Start: 2011-05-06 — End: ?

## 2011-05-06 MED ORDER — ATORVASTATIN CALCIUM 20 MG OR TABS
20.0000 mg | ORAL_TABLET | Freq: Every evening | ORAL | Status: DC
Start: 2011-05-06 — End: 2011-05-06

## 2011-05-06 MED ORDER — METOCLOPRAMIDE HCL 10 MG OR TABS
10.0000 mg | ORAL_TABLET | Freq: Three times a day (TID) | ORAL | Status: DC
Start: 2011-05-06 — End: 2011-05-12

## 2011-05-06 MED ORDER — SENNA 8.6 MG OR TABS
17.2000 mg | ORAL_TABLET | Freq: Every day | ORAL | Status: DC
Start: 2011-05-06 — End: 2011-05-06

## 2011-05-06 NOTE — Procedures (Signed)
FINAL PATHOLOGIC DIAGNOSIS:  A:  Colon, right, biopsy       -Focal minimal acute colitis, see comment.  B:  Colon, 30 to 35 cm, biopsy       -Benign colonic mucosa with focal minimal acute colitis and lamina    propria fibrosis, see comment.  C:  Rectosigmoid colon, biopsy       -Focal mild acute colitis, see comment.  COMMENT:  For parts A, B, and C, there is focal acute cryptitis without  evidence of architectural distortion.  The differential diagnosis includes  an acute infectious colitis, ischemic colitis, drug injury including  NSAIDs, early changes of inflammatory bowel disease, and bowel preparation  injury. For part B, there are foci of lamina propria fibrosis with focal  withered appearing glands. The findings are suggestive of ischemia. These  superficial mucosal biopsies do not show larger vessels to evaluate for  vasculitis or thrombosis. Please correlate clinically.     SPECIMEN(S) SUBMITTED:  A: right colon biopsy  B: biopsy at 35 to 30 cm  C: rectosigmoid biopsy  CLINICAL HISTORY:  History of ischemic colitis, rectal bleeding.  Colonoscopy: Right colon - mild erythema; 35 to 30 cm - erythema, exudates;  rectosigmoid - mild erythema, rule out ischemia, inflammatory bowel  disease.     GROSS DESCRIPTION:  A: The specimen (received in formalin, labeled with the patient's name,  medical record number and "right colon bx") consists of five fragments of  red-tan soft tissue ranging from 0.3 up to 0.7 cm in greatest dimension.  The specimen is entirely submitted in cassette A1.  B: The specimen (received in formalin, labeled with the patient's name,  medical record number and "bx at 35-30 cm") consists of three red-tan  fragments of soft tissue ranging from 0.1 cm up to 0.4 cm in greatest  dimension. The specimen is entirely submitted in cassette B1.  C: The specimen (received in formalin, labeled with the patient's name,  medical record number and "rectosigmoid bx") consists of three fragments  of  tan-red soft tissue ranging from 0.2 to 0.4 cm in greatest dimension. The  specimen is entirely submitted in cassette C1.  MW/LM/jb  CONFIDENTIAL HEALTH INFORMATION: Health Care information is personal and  sensitive information. If it is being faxed to you it is done so under  appropriate authorization from the patient or under circumstances that do  not require patient authorization. You, the recipient, are obligated to  maintain it in a safe, secure and confidential manner. Re-disclosure  without additional patient consent or as permitted by law is prohibited.  Unauthorized re-disclosure or failure to maintain confidentiality could  subject you to penalties described in federal and state law.  If you have  received this report or facsimile in error, please notify the [facility] immediately and destroy the received document(s).   Material reviewed and Interpreted and  Report Electronically Signed by:  Einar Pheasant M.D., Ph.D. [phone])  Attending Surgical Pathologist  05/06/11 11:33  Electronic Signature derived from a single  controlled access password

## 2011-05-06 NOTE — Discharge Summary (Signed)
Date of Admission:  05/01/2011  Date of Discharge:  05/06/2011    Patient Name:  Katherine Church    Principal Diagnosis (required):  Acute ischemic colitis    Hospital Problem List (required):  Active Hospital Problems   Diagnoses   . *Acute ischemic colitis [557.0]   . Hypertension [401.9]   . Diabetes [250.00]      Resolved Hospital Problems   Diagnoses   . Abdominal pain [789.00]     Principal Procedure During This Hospitalization (required):  Colonoscopy (05/02/11)     Other Procedures Performed During This Hospitalization (required):  CT imaging of abdomen and pelvis     Procedure results are available in Chart Review in Epic.  For those providers external to [facility], the key procedure results are listed below:    Colonoscopy (05/02/11):  Predominantly sigmoid inflammation consistent with colon ischemia in setting of history, and previously elevated lactate. There was no stricture or ulceration seen. Follow up biopsies to help exclude less likely Crohn's colitis.    CT abdomen and pelvis with contrast (05/01/11):   1. No radiographic evidence of colitis or bowel obstruction. The sigmoid   colon is nondistended, limiting evaluation for bowel wall thickening. However,  no pericolonic inflammatory changes are seen.   2. 1 cm nodule in the left adrenal gland, nonspecific, but appears to be   stable dating back to CT scan dated 09/02/2009.   3. Mildly prominent right iliac chain lymph nodes are stable from prior exam.  4. New mild nonspecific posterior groundglass opacities in the lung bases which could represent atelectasis or developing patchy areas of consolidation.    Consultations Obtained During This Hospitalization:  Gastroenterology    Key consultant recommendations:  -Work up pending for coagulopathy like antiphospholipid syndrome, Protein C/S deficiency.   -Avoid NSAIDS   -Avoid hypotension   -Analgesia, keep well hydrate IV fluids.   -C diff is neg   -Agree with diet.   -Avoid heavy exercise.   -Follow up in GI  clinic.     Reason for Admission to the Hospital / History of Present Illness:  From admission H&P on 05/01/11:   "Katherine Church is a 45 year old female with history of ischemic collitis and recent admission for LGIB who is here for evaluation of the above chief complaint. Pt was admitted last month for abdominal pain and brbpr. She underwent colonoscopy and EGD; she was ultimately diagnosed with NSAID colopathy and was advised to avoid nsaids. Since discharge, pt reports she was feeling well until the past week when she experienced postprandial nausea and abdominal fullness. Last night she had a bowel movement with approximately 1/2 cup of bright red blood. No LH, dizziness, vomiting. She noticed fatigue and systolic blood pressure in the 90s (baseline >120s). Her abdominal pain also became more diffuse, R >L, and constant. She denies further NSAID use She presented to the ED for evaluation.   In ED, pt was hemodynamically stable. Rectal exam revealed bright red blood. Labs notable for hgb 13.3 and lactate 32.6 though decreased to 15 on repeat. CT a/p did not show evidence of ischemic colitis or other source of bleed per prelim report. GI was consulted and is planning possible flex sig tomorrow."    Hospital Course by Problem (required):    #Abdominal pain with hematochezia  On admission, [name] presented with acute, episodic epigastric and RLQ abdominal pain and spasms partially alleviated by the passage of loose stools and hematochezia, as well as  nausea and vomiting. She has a hx of ischemic colitis diagnosed in 08/2009 and was last hospitalized from 8/28-04/19/11 with similar symptoms and a diagnosis of NSAID colopathy. Following her last hospitalization, she stopped taking NSAIDs and increased her exercise without alleviation of symptoms. She presented to the ED in acute distress. CT abdomen/pelvis showed no evidence of colitis or bowel obstruction. Colonoscopy showed sigmoid inflammation consistent with  colonic ischemia, but no strictures or ulcerations. The differential for her recurrent abdominal pain includes ischemic colitis, porphyria, IBD, and IBS. Although pt has subjective positive FH of maternal GMA with "ulcers" and uncle with Crohn's, IBD was less likely based on colonoscopy showing no evidence of strictures or ulcerations. GI consulted on the pt, and diagnosed her with ischemic colitis based on her history as well as elevated lactate (32.6) and WBC (10.1) on admission. 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 Other DRVVT tests, AT III, and protein C/S tests were within normal limits. Antiphospholipid antibody and Factor V were pending at discharge.     Pain was adequately controlled throughout admission. She was initially on morphine PCA, then transitioned to morphine (MSIR) 15 mg for severe pain and vicodin for moderate pain. At discharge, pt was given morphine and instructed to continue percocet for abdominal pain. She was advised to avoid NSAIDs, hypotension, beverages with caffeine/epinephrine, and heavy exercise, and consider keeping a home diary of inciting triggers (i.e. food, exercise, stress) that could be contributing to her episodic abdominal pain. She was also instructed to follow-up with GI clinic within the next 4 weeks and her primary care doctor (Dr. Anselm Jungling at [facility]) at their earliest available appointment.     #Constipation  At admission, pt complained of longstanding RLQ fullness and chronic constipation (only stooling 1 small, hard BM every other day) despite home bowel regimen of miralex and colace. During her admission, she was started on miralex, colace, and senna to improve stooling. On 9/23, she was given Golytely to improve stooling, and  had one episode of loose stool with some mild relief of abdominal pain. 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 We placed a nutrition consult to have a dietician come speak to her about diet recommendations to help alleviate constipation. She would benefit from further reinforcement of these recommendations as an outpatient.  She was discharged on miralex, colace, senna, and metoclopramide.     #HTN   Pt has long history of difficult-to-control hypertension treated with lisinopril, amlodipine, coreg, and clonidine. During the first two days of admission (9/20-9/22), pt was not administered home anti-HTN meds in order to avoid hypotension that could contribute to ischemic colitis. BP was stable in 110-120s. However, over the course of the hospitalization the patient's blood pressure trended upward and pt complained of HA, throbbing behind her eyes, and palpitations, so she was started back on lisinopril, amlodipine, and clonidine. BP was persistently high in 150-180s, so coreg was also added back. At discharge, BP was stable in 130-140s/80s and pt was instructed to continue all previous home antihypertensive medications and follow-up with PCP regarding management of HTN.    #DM II  Last A1C was 6.6 on 05/02/11 and pt normally controlled on glipizide 5 mg. Pt was on ISS during admission (required about 2  units of lispro per day) with glucose ranging from 130-180s. Pt denied any hypo or hyperglycemic symptoms during admission and was discharged on home medication.     Tests Outstanding at Discharge Requiring Follow Up:  -Pathology for colonoscopy biopsy  -Coagulopathy panel (antiphospholipid antibody, Factor V Leiden)   -24-hour uroporphobilinogen     Discharge Condition (required):  Stable.    Key Physical Exam Findings at Discharge:  Physical examination is signifcant for:  mild RUQ and RLQ tenderness.    Discharge Diet:   Diabetic / low-carbohydrate.    Discharge Medications:  Discharge Medication List as of 05/06/2011  3:08 PM      START taking these medications    Details   polyethylene glycol (GLYCOLAX) powder Take 17 g by mouth daily., 17 g, Oral, DAILY Starting 05/06/2011, Until Discontinued, Disp-255 g, R-0, Fax         CONTINUE these medications which have CHANGED    Details   atorvastatin (LIPITOR) 20 MG tablet Take 1 tablet by mouth daily., 20 mg, Oral, DAILY Starting 05/06/2011, Until Discontinued, Disp-30 tablet, R-1, Fax      metoclopramide (REGLAN) 10 MG tablet Take 1 tablet by mouth 3 times daily., 10 mg, Oral, 3 TIMES DAILY Starting 05/06/2011, Last dose on Tue 07/29/11, Disp-60 tablet, R-0, Fax      senna (SENOKOT) 8.6 MG tablet Take 1 tablet by mouth daily., 8.6 mg, Oral, DAILY Starting 05/06/2011, Until Discontinued, Disp-30 tablet, R-1, Fax      morphine (MSIR) 15 MG tablet Take 1 tablet by mouth every 4 hours as needed for Severe Pain (Pain Score 7-10)., 15 mg, Oral, EVERY 4 HOURS PRN Starting 05/06/2011, Until Discontinued, Disp-20 tablet, R-0, Handwritten         STOP taking these medications       polyethylene glycol (MIRALAX) packet Comments:   Reason for Stopping:         omeprazole (PRILOSEC) 20 MG capsule Comments:   Reason for Stopping:         Calcium Carbonate Antacid (TUMS PO) Comments:   Reason for Stopping:         lisinopril (PRINIVIL, ZESTRIL) 40 MG tablet Comments:   Reason for Stopping:         promethazine (PHENERGAN) 12.5 MG tablet Comments:   Reason for Stopping:         LORazepam (ATIVAN) 1 MG tablet Comments:   Reason for Stopping:         carvedilol (COREG) 25 MG tablet Comments:   Reason for Stopping:         oxyCODONE-acetaminophen (PERCOCET) 5-325 MG per tablet Comments:   Reason for Stopping:         glipiZIDE (GLIPIZIDE XL) 5 MG CR tablet Comments:   Reason for Stopping:         traMADol (ULTRAM) 50 MG tablet Comments:   Reason for Stopping:         oxyCODONE-acetaminophen (PERCOCET) 5-325 MG  per tablet Comments:   Reason for Stopping:         ondansetron (ZOFRAN) 4 MG tablet Comments:   Reason for Stopping:         clonidine (CATAPRES) 0.3 MG tablet Comments:   Reason for Stopping:         amLODIPINE (NORVASC) 10 MG tablet Comments:   Reason for Stopping:         docusate sodium (COLACE) 100 MG capsule Comments:   Reason for Stopping:  Allergies:  Allergies   Allergen Reactions   . Ciprofloxacin Rash   . Flagyl (Metronidazole Hcl) Rash   . Tetanus Toxoids Swelling     Discharge Disposition:  Home.    Discharge Code Status:  Full code / full care  This code status is not changed from the time of admission.    Follow Up Appointments:    Scheduled appointments:  Follow up with GI, Dr Copur in 2 to 4 weeks.    For appointments requested for after discharge that have not yet been scheduled, refer to the Post Discharge Referrals section of the After Visit Summary.    Discharging Physician's Contact Information:  [facility] operator at [phone].

## 2011-05-06 NOTE — Progress Notes (Signed)
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     Interval events:  -Seen by GI yesterday; signed off  -Discontinued morphine PCA, started morphine 15 mg q4h prn and vicodin q6h prn  -Pt received morphine at 0059 and 0550, and vicodin at 1925.  -Added metoclopramide to improve bowel motility  -Added Coreg 25 mg BID to improve BP control     Subjective:  Developed RUQ abdominal "tightness" and pain after eating beef at dinner. Has not passed stool in last 24-hours. Endorses adequate pain relief on current medications (morphine and vicodin) and cessation of nausea 2/2 change of pain medications. Denies fever/chills, headache, night sweats, chest pain, palpitations, shortness of breath, dysuria, diarrhea, or constipation. When discussing ischemic colitis as cause of symptoms, pt mentioned that her mother has idiopathic thrombocytopenia currently being treated with steroids and a hx of "thin veins."     Objective:  Vital Signs:  BP 110/70  Pulse 62  Temp 98.7 F (37.1 C)  Resp 16  Ht 5\' 4"  (1.626 m)  Wt 79.379 kg (175 lb)  BMI 30.04 kg/m2  SpO2 100%  LMP 01/10/2011    Physical Exam:  General: well appearing female in no acute distress  HEENT: moist mucous membranes  CV: RRR, no m/r/g  Pulm: CTAB in posterior lung fields, no w/r/r  Abd: soft, slightly distended in bilateral upper quadrants, TTP in lateral RUQ; normoactive bowel sounds  Ext: warm, well perfused; no clubbing, edema, or cyanosis    Laboratory data:   Lab Results   Component Value Date    NA 137 05/06/2011    K 4.1 05/06/2011    CL 104 05/06/2011    BICARB 23 05/06/2011    BUN 7 05/06/2011    CREAT 0.45*  05/06/2011    GLU 157* 05/06/2011    [place] 9.5 05/06/2011       PHOS  3.9      MG   2.1    Lab Results   Component Value Date    WBC 10.5* 05/06/2011    HGB 12.3 05/06/2011    HCT 36.6 05/06/2011    PLT 196 05/06/2011    SEG 69 05/06/2011    LYMPHS 23 05/06/2011    MONOS 4* 05/06/2011    EOS 3 05/06/2011       Coagulation panel (05/03/11): DRVVT Normalized Test Ratio- 1.21 (slightly elevated) abnormal but corrects upon 1:1 mix with normal plasma. 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     #Abdominal pain with hematochezia: Pt developed RUQ pain 2/2 beef at dinner, but did not stool. Pain appropriately controlled on current meds. Still pending labs for etiology of abdominal  pain.   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 Diff pending   -Dietician consult for eval of chronic constipation pending  -Continue morphine (MSIR)15 mg and vicodin q6hr PRN   -Ondansetron and phenergan q6h prn; encouraged to take antinausea meds prior to pain med administration   -Avoid NSAIDs, hypotension, 5h energy drink w/ caffeine or epinephrine derivatives, heavy exercise    #Constipation: pt has not stooled in last 24-hours.   -Continue bowel regimen (senna 17.2 mg qdaily, colace 250 mg qdaily, miralax, metoclopramide)   -Encourage PO hydration      #DM II: POCT glucose stable at 140-172.  -ISS     #HTN: Added Coreg yesterday to improve BP control. BP ranging from 105-157/70-105 in last 24-hours. Pt denies HA, chest pain, or palpitations.   -Continue Lisinopril 40 mg qdaily, Clonidine 0.3 mg, Amlodipine 10 mg, Coreg 25 mg BID  -IV hydralazine PRN     #Hyperlipidemia: stable   -Lipitor 20 mg qdaily     #Insomnia: stable  -Zolpidem 5 mg qPM prn     #FEN/GI   -Carb limited PO diet    #PPX   -On SCDs; pt is ambulatory  -Lansoprazole 30 mg bid     #Code status   -Full code, full care     #Dispo   -Pending improvement of abdominal pain   -Recommended OP follow-up with GI    Reviewed and edited by Katherine Church PGY1 [phone].     Staffed with Katherine Sicilian, MD on 05/06/11.

## 2011-05-06 NOTE — Progress Notes (Signed)
I have evaluated the patient today; she will be discharged from the hospital today.     Today, her physical examination is notable for a benign abdominal exam.      Please refer to the Discharge Summary for further details.    I spent 40 minutes on the patient's care unit in the preparation and execution of the hospital discharge.    Ned Card [facility]

## 2011-05-06 NOTE — Interdisciplinary (Signed)
*  Nutrition Education Note:     -With regard to age, religion, & culture, verbally educated pt on the diabetic diet as well as ways to help with constipation. -Readiness to learn: great                                                  -Written education material provided: Carbohydrate Counting, Constipation Nutrition Therapy                          -Verbalized understanding by listing high fiber foods and importance of increased fluids.  PT understands importance of balanced meals and portion control but states she needs to work on not skipping meals and eating more small, frequent meals.                                -Expected compliance: good as PT motivated to learn and asked appropriate questions.                                               -Answered all of patient's questions & provided the nutrition dept phone #      Everlene Farrier, RD pager [phone]

## 2011-05-06 NOTE — Plan of Care (Signed)
Problem: Bleeding - Risk of  Goal: Absence of active bleeding  Outcome: Met  Pt. A&Ox4. Pt vital signs stable.  Pt H&H stable.  Pt denies having blood in stool.  Pt currently free of signs or symptoms of active bleeding.      Problem: Pain - Acute  Goal: Communication of presence of pain  Outcome: Met  Pt's tolerable level of pain is a 4/10. Pt currently has pain well under control with current pain medication.  Pt instructed to call nurse if pain not well controlled or pain increases to beyond tolerable level.   Will continue to monitor.     Problem: Falls - Risk of  Goal: Absence of falls  Outcome: Met  Pt A&Ox4.  Pt able to ambulate to independently.  Pt considered a low fall risk.  Pt ambulates with a steady gait.  Bed locked and in low position.  Pt has non-skid slippers on when ambulating. Room free of clutter.   Pt vital signs stable, denies lightheadedness, dizziness upon moving from lying to sitting and from sitting to standing. Will continue to monitor patients risks for falling and implement fall prevention protocol appropriately.      Problem: Alteration in Blood Glucose  Goal: Glucose level within specified parameters  Outcome: Met  Pt newly diagnosis with diabetes.  BS sugar before bed was 172, pt didn't receive coverage.  Pt uses glucometer at home and uses oral anti-hyperglycemic medication at home.  Pt educated on the importance of using diet to control BS.  Pt verbalized understand.  Will continue to monitor.      Problem: Tissue Perfusion - Cardiopulmonary, Altered  Goal: Early recognition of deterioration  Outcome: Met  Pt's hemodynamically stable, Pt O2 saturation >98%. Pt denies chest, sob, free of doe.  Pt vital signs stable.  Pt has good perfusion on all extremities.  Pt denies numbness or tingling on extremities.  Will continue to monitor.

## 2011-05-06 NOTE — Plan of Care (Signed)
Problem: Tissue Perfusion - Cardiopulmonary, Altered  Goal: Early recognition of deterioration  Outcome: Resolved Date Met:  05/06/11  Pt alert and oriented. Abdominal pain under control with po pain med. DC instruction given to pt. Pt verbalized understanding of the material. Pt taken to the [facility] pharmacy. Pt has a taxi voucher provided by the SW.

## 2011-05-06 NOTE — Discharge Instructions (Addendum)
Diagnosis and Reason for Admission    You were admitted to the hospital for the following reason(s):  Abdominal pain    Your full diagnosis list is located on this After Visit Summary in the Hospital Problems section.    What Happened During Your Hospital Stay    The main tests and treatments done for you during this hospitalization were:    Colonoscopy    The following evaluation is still important to complete after discharge from the hospital:  Follow up with gastroenterologist and with your primary care doctor to discuss lab results.     Instructions for After Discharge    Your diet at home should be a low-salt and diabetic (low-sugar) diet.    Your activity level at home should be:  as much exercise or activity as you can tolerate.    Specific activity restrictions:    Do not drive while taking narcotic pain medications.  Do not work with heavy or complex machinery while taking narcotic pain medications.    Wound or tube care instructions:  None    The medication list located on this After Visit Summary in the Current Discharge Medication List section IS NOT CORRECT due to a mistake in sending medications to another pharmacy. Your correct medication list is below.  Your nurse will review this information with you before you leave the hospital.    Your full discharge medication list is as possible:  START taking   Atorvastatin (lipitor) 20 mg daily  Metoclopramide (reglan) 10 mg three times daily  Morphine (MSIR) 15 mg tablet every four hours as needed for severe pain  Senna 8.6 mg tablet one tablet daily  Polyethylene glycol (glycolax) 17g by mouth daily as needed for constipation    CONTINUE taking:   TUMS  Carvedilol 25 mg twice daily with meals  Clonidine 0.3 mg tab 2 times daily  Amlodipine 10 mg daily  Omeprazole 20 mg daily  Lisinopril 40 mg daily  Lorazepam 1 mg every 6 hours as needed  Prochlorperazine 5 mg every 6 hours PRN  ambien 5 mg tablet as needed for insomnia  glipizide 5mg  tablet daily  Docusate  (colace) 100 mg capsule 2 caps 2 times daily  Percocet every 6 hours as needed for pain  Promethazine (phenergan) as needed for nausea    It is very important for you to keep a current medication list with you in order to assist your doctors with your medical care.  Bring this After Visit Summary with you to your follow up appointments.    Reasons to Contact a Doctor Urgently    Call 911 or return to the hospital immediately if:  You develop worsening abdominal pain, uncontrolled nausea and vomiting, bloody or black stool, fevers, chills, unintentional weight loss.     You should contact either your primary care physician or your hospital physician for any of the following reasons: reasons as stated abvoe    If you have any questions about your hospital care, your medications, or if you have new or concerning symptoms soon after going home from the hospital, and you need to contact your hospital physician, your hospital physician can be contacted in the following manner:  [facility] operator at [phone].    If you develop any symptoms of protruding and twisting movements of tongue, pouting, puckering, or smacking movements of the lips, retraction of the corners of the mouth, bulging of the cheeks, chewing movements, twisting of your fingers or tapping foot movements,  STOP taking metoclopramide (Reglan) and talk to your doctor about this. These movements are a rare side effect of Reglan and should improve on stopping the medication.     Once you are able to see your primary care physician (PCP), your PCP will then be responsible for further medication refills, or appointment referrals.    What Needs to Happen Next After Discharge -- Appointments and Follow Up    Any appointments already scheduled at [place] clinics will be listed in the Future Appointments section at the top of this After Visit Summary.  Any appointments that have been requested, but have not yet been scheduled, will be listed below that  under Post Discharge Referrals.    Sometimes tests performed in the hospital do not yet have results by the time a patient goes home.  The following key tests will need to be followed up at your next appointment: coagulopathy panel, c. diff     Additional Information for You from your Case Manager and/or Social Worker (if applicable)        Additional Information for You from your Inpatient Therapists (if applicable)        Additional Information for You from your Discharging Nurse (if applicable)        Handouts Given to You (if applicable)    Diabetes Handbook provided

## 2011-05-06 NOTE — Interdisciplinary (Signed)
Dispo Plan: dc home today. Spoke with pt and she told me that she have applied for CMS/LIHP and waiting for an approval. DM education provided by nursing. Informed of [facility]  For cheaper prescriptions and also for her Diabetic supplies. She is already aware of the com clinics for her OP ff up. She expresses understanding and also acknowledges how important it is for her to control her diabetes.  No other questions. SW provided her a taxi voucher to [employer] home. She lives with her  BF who provides her free rent. Has children but they are all adults not living with her.

## 2011-05-08 LAB — CARDIOLIPIN ANTIBODY, IGG, BLOOD: Cardiolipin IgG, Ab: 5 [GPL'U] (ref 0–14)

## 2011-05-09 ENCOUNTER — Telehealth (INDEPENDENT_AMBULATORY_CARE_PROVIDER_SITE_OTHER): Payer: Self-pay | Admitting: Internal Medicine

## 2011-05-09 MED ORDER — HYDROCODONE-ACETAMINOPHEN 7.5-750 MG OR TABS
1.0000 | ORAL_TABLET | Freq: Four times a day (QID) | ORAL | Status: DC | PRN
Start: 2011-05-09 — End: 2011-07-09

## 2011-05-09 NOTE — Telephone Encounter (Signed)
Called [facility] [phone] to verify order for Vicodin ES 7.5-750mg  tabs written 05/09/11 per Dr. Ileene Rubens.  Closing.

## 2011-05-09 NOTE — Telephone Encounter (Signed)
ED precautions d/w pt. RTED for blood in stool, return of pain (especially if unrelieved by medication), fever or chills, any other accompanying symptoms. Pt verbalized understanding and agreeable to plan of care.

## 2011-05-09 NOTE — Telephone Encounter (Signed)
Admitted 05/01/2011-05/06/2011 Ischemic Colitis  Pt was given 20 vicodin upon d/c but reports not enough medication. Has been taking 1-2 tabs every 6 hours for pain.   Pt currently only has 3 tablets left; has follow up appointment scheduled for:  Monday 05/12/2011 3:30 PM Mila Palmer, MD    Requesting to have additional medication for the weekend until follow up Monday.

## 2011-05-09 NOTE — Telephone Encounter (Signed)
This is not unreasonable, I am familiar with the pt. Will prescribe # 20 pills, thanks, As    Thanks, as

## 2011-05-09 NOTE — Telephone Encounter (Signed)
Pt needs a refill for for her narco was released from ED on 09/20  would like to know if you would be able to help by telling her who she can contact to get a triplicate .

## 2011-05-09 NOTE — Telephone Encounter (Signed)
[facility] calling to verify RX need a verbal approval for :  hydrocodone-acetaminophen (VICODIN ES) 7.5-750 MG per tablet    please call back to [phone]

## 2011-05-12 ENCOUNTER — Ambulatory Visit (INDEPENDENT_AMBULATORY_CARE_PROVIDER_SITE_OTHER): Payer: PRIVATE HEALTH INSURANCE | Admitting: Internal Medicine

## 2011-05-12 VITALS — BP 135/90 | HR 72 | Temp 98.0°F | Resp 14 | Ht 64.0 in | Wt 174.0 lb

## 2011-05-12 DIAGNOSIS — K559 Vascular disorder of intestine, unspecified: Secondary | ICD-10-CM

## 2011-05-12 DIAGNOSIS — I10 Essential (primary) hypertension: Secondary | ICD-10-CM

## 2011-05-12 MED ORDER — CLONIDINE HCL 0.3 MG OR TABS
0.3000 mg | ORAL_TABLET | Freq: Three times a day (TID) | ORAL | Status: DC
Start: 2011-05-12 — End: 2011-07-09

## 2011-05-12 MED ORDER — LISINOPRIL 40 MG OR TABS
40.0000 mg | ORAL_TABLET | Freq: Two times a day (BID) | ORAL | Status: DC
Start: 2011-05-12 — End: 2011-08-08

## 2011-05-12 MED ORDER — HYDROCODONE-ACETAMINOPHEN 7.5-325 MG OR TABS
1.0000 | ORAL_TABLET | Freq: Four times a day (QID) | ORAL | Status: DC | PRN
Start: 2011-05-12 — End: 2011-07-01

## 2011-05-12 MED ORDER — METOCLOPRAMIDE HCL 10 MG OR TABS
10.0000 mg | ORAL_TABLET | Freq: Three times a day (TID) | ORAL | Status: AC
Start: 2011-05-12 — End: 2011-08-04

## 2011-05-12 MED ORDER — LISINOPRIL 5 MG OR TABS
40.0000 mg | ORAL_TABLET | Freq: Every day | ORAL | Status: DC
Start: 2011-05-12 — End: 2012-10-06

## 2011-05-12 MED ORDER — AMLODIPINE 10 MG OR TABS
10.0000 mg | ORAL_TABLET | Freq: Every day | ORAL | Status: DC
Start: 2011-05-12 — End: 2011-07-10

## 2011-05-12 MED ORDER — ATORVASTATIN CALCIUM 20 MG OR TABS
20.0000 mg | ORAL_TABLET | Freq: Every day | ORAL | Status: DC
Start: 2011-05-12 — End: 2011-11-03

## 2011-05-12 MED ORDER — CARVEDILOL 25 MG OR TABS
12.5000 mg | ORAL_TABLET | Freq: Two times a day (BID) | ORAL | Status: DC
Start: 2011-05-12 — End: 2012-01-14

## 2011-05-12 NOTE — Progress Notes (Signed)
Attending Note:  Chief Complaint   Patient presents with   . Hospital F/U   . Hypertension     fluctuations states pt.       Subjective:  I reviewed the history.  Patient interviewed and examined.   Katherine Church is a 45 year old female is here for recent hospitalization.  Recent eval for abd pain, and eval so far suggestive of ischemic bowel.   Initially GI sx's felt related to heavy NSAID use, but more recently, dx'd with ischemic bowel.    Also active issue of elev and variable  BP, as well outlined by Dr. Mila Palmer.    Home BP's have been low and she has been symptomatic.  BS's reviewed.    Current tx of ischemic bowel, is pain control until she sees GI.  Still requiring narcotics for pain relief.      Review of Systems - 12 step ROS reviewed, and negative except as noted in HPI and PMHx.    Past Medical, Family, Social History:  As per  the resident's  Note and EPIC.  Past Medical History   Diagnosis Date   . Back pain    . High blood pressure    . Diabetes mellitus    . Hyperlipidemia LDL goal < 100    . Colitis, ulcerative      Objective:   I have examined the patient and I concur with the resident's exam.  Recent labs and imaging as well as other data reviewed.    Assessment/Plan:    Assessment and plan reviewed with the resident physician.  I agree with the resident's plan as documented.    See the resident's note for further details.      Follow up as per resident.    Patient Instruction:  See Patient Education/Instruction section.      Barriers to Learning assessed: none. All of the patients questions were answered to patient's satisfaction. Patient verbalizes understanding of teaching and instructions and is agreeable to above plan.

## 2011-05-12 NOTE — Progress Notes (Signed)
R2 Clinic Note    HPI:  Katherine Church is a 45 year old female with h/o HTN, DM, HL presents for hospital f/u. Pt was recently hospitalized for acute abdominal pain and diagnosed with ischemic colitis based on leukocytosis, elevated lactate, and colonoscopy with sigmoid inflammation c/w colonic ischemia. She was hospitalized 1 month previously for acute abdominal pain thought to be 2/2 heavy NSAID use. Since discharge, pt has had intermittent episodes of 10/10 abdominal pain and has been taking 8 vicodin/day. Also, blood pressures have been running between 80-130 systolic. Pt reports feeling symptomatic when systolic bp drops below 90.     PMH:  Past Medical History   Diagnosis Date   . Back pain    . High blood pressure    . Diabetes mellitus    . Hyperlipidemia LDL goal < 100    . Colitis, ulcerative      Meds:  Current Outpatient Prescriptions   Medication Sig   . atorvastatin (LIPITOR) 20 MG tablet Take 1 tablet by mouth daily.   . metoclopramide (REGLAN) 10 MG tablet Take 1 tablet by mouth 3 times daily.   . hydrocodone-acetaminophen (NORCO) 7.5-325 MG per tablet Take 1 tablet by mouth every 6 hours as needed for Moderate Pain (Pain Score 4-6). No more then 8 tabs a day   . clonidine (CATAPRES) 0.3 MG tablet Take 1 tablet by mouth 3 times daily.   . carvedilol (COREG) 25 MG tablet Take 0.5 tablets by mouth 2 times daily.   Marland Kitchen amLODIPINE (NORVASC) 10 MG tablet Take 1 tablet by mouth daily.   . hydrocodone-acetaminophen (VICODIN ES) 7.5-750 MG per tablet Take 1 tablet by mouth every 6 hours as needed for Moderate Pain (Pain Score 4-6) or Severe Pain (Pain Score 7-10).   Marland Kitchen senna (SENOKOT) 8.6 MG tablet Take 1 tablet by mouth daily.   . polyethylene glycol (GLYCOLAX) powder Take 17 g by mouth daily.   Marland Kitchen DISCONTD: atorvastatin (LIPITOR) 20 MG tablet Take 1 tablet by mouth daily.   Marland Kitchen DISCONTD: metoclopramide (REGLAN) 10 MG tablet Take 1 tablet by mouth 3 times daily.   Marland Kitchen DISCONTD: Calcium Carbonate Antacid (TUMS  PO)    . DISCONTD: carvedilol (COREG) 25 MG tablet Take 1 tablet by mouth 2 times daily.   Marland Kitchen DISCONTD: clonidine (CATAPRES) 0.3 MG tablet Take 1 tablet by mouth 3 times daily.   Marland Kitchen DISCONTD: amLODIPINE (NORVASC) 10 MG tablet Take 1 tablet by mouth daily.     Current Facility-Administered Medications   Medication   . lisinopril (PRINIVIL, ZESTRIL) tablet 40 mg     Allergies:  Allergies   Allergen Reactions   . Ciprofloxacin Rash   . Flagyl (Metronidazole Hcl) Rash   . Tetanus Toxoids Swelling     Review of Systems:  Additional ROS: 10 system ROS form reviewed with patient   Constitutional:denies fever, chills, night sweats, loss of energy or appetite   EYES: denies visual change, eye pain   ENT: denies oral lesions, sinus congestion or pain   HEART/LUNGS: denies orthopnea, shortness of breath, dyspnea on exertion, cough, chest pain, pedal edema   GI: denies nausea, vomiting, diarrhea  GU: denies dysuria, difficulties in urination, incontinence   Neuro: denies new focal neurologic deficits, dizziness, syncope   Musculoskeletal: denies new or change in myalgias or arthralgias   Derm: denies new skin rashes  Psych: denies recent changes in mood, sleep or stress level    BP 135/90  Pulse 72  Temp(Src) 98  F (36.7 C) (Oral)  Resp 14  Ht 5\' 4"  (1.626 m)  Wt 78.926 kg (174 lb)  BMI 29.87 kg/m2  SpO2 97%  LMP 01/10/2011    Physical Exam:  NAD, pleasant  EOMI, PERRL, MMM, OP clear, tympanic membranes clear, nasal turbinates non-erythematous, no sinus tenderness, no LAD  RRR, nl s1, s2, no m/r/g, non-elevated JVP  CTAB, no w/c/r, non-labored breathing  +BS, soft, mild epigastric tenderness, no suprapubic tenderness, ND, no r/g, no fluid wave, no HSM  No c/c/e  CNII-XII intact, 5/5 strength throughout UE and LE, nl sensation to lt touch and vibration throughout, 2+ DTRs throughout, no cerebellar deficits, nl finger to nose, nl heel to shin    Lab Results   Component Value Date    WBC 10.5 05/06/2011    RBC 4.42 05/06/2011     HGB 12.3 05/06/2011    HCT 36.6 05/06/2011    MCV 82.8 05/06/2011    MCHC 33.6 05/06/2011    RDW 13.1 05/06/2011    PLT 196 05/06/2011    PLT 219 05/21/2009    MPV 9.5 05/06/2011    SEG 69 05/06/2011    LYMPHS 23 05/06/2011    MONOS 4 05/06/2011    EOS 3 05/06/2011    BASOS 1 09/04/2009     Lab Results   Component Value Date    BUN 7 05/06/2011    CREAT 0.45 05/06/2011    CL 104 05/06/2011    NA 137 05/06/2011    K 4.1 05/06/2011    [place] 9.5 05/06/2011    TBILI 0.4 05/05/2011    ALB 4.3 05/05/2011    TP 7.8 05/05/2011    AST 18 05/05/2011    ALK 70 05/05/2011    BICARB 23 05/06/2011    ALT 28 05/05/2011    GLU 157 05/06/2011     Lab Results   Component Value Date    A1C 6.6 05/02/2011    A1C 6.6 04/11/2011    A1C 7.4 01/21/2011     Lab Results   Component Value Date    TSH 1.27 05/02/2010     Assessment & Plan:  45 year old female with h/o HTN, DM, HL presents for hospital f/u.    # ischemic colitis - pt still w/ intermittent abdominal pain somewhat controlled by vicodin.   - encouraged prompt f/u w/ GI clinic  - pt given strict ED return precautions  - prescribed norco (#60), given concern that pt is taking so much vicodin we have switched to norco b/c it has less tylenol  - f/u in 2 weeks    # HTN - bp meds were initially held during hospitalization given c/f acute abdomen, but then were resumed when bp became out of control and pt became symptomatic. She was resumed on her previous doses of bp meds, except carvedilol was increased from 12.5 to 25 mg BID. Pt has been checking her bp and has experienced some hypotensive episodes.  - decreased carvedilol back to 12.5 mg BID  - continue lisinopril 40 mg BID  - continue clonidine 0.3 mg TID  - continue amlodipine 10 mg daily    [address] [place]

## 2011-05-13 ENCOUNTER — Telehealth (INDEPENDENT_AMBULATORY_CARE_PROVIDER_SITE_OTHER): Payer: Self-pay | Admitting: Internal Medicine

## 2011-05-13 NOTE — Telephone Encounter (Signed)
Please call [facility] to verify prescription for hydrocodone-acetaminophen (NORCO) 7.5-325 MG per tablet    Please call [phone].

## 2011-05-13 NOTE — Telephone Encounter (Signed)
Called [facility] [phone] and advised of current order written 05/12/11 Norco 7.5-325mg  tabs disp. #60x0 refills per Dr. Ileene Rubens.  Closing.

## 2011-05-19 ENCOUNTER — Other Ambulatory Visit (INDEPENDENT_AMBULATORY_CARE_PROVIDER_SITE_OTHER): Payer: Self-pay | Admitting: Internal Medicine

## 2011-05-19 NOTE — Telephone Encounter (Signed)
Verbally confirmed name of Primary Care Provider: yes    Last visit Date: 05/12/2011    Medications:  HYDROCODONE-ACETAMINOPHEN 7.5-325 MG   Pt requesting medication until next appt with Dr. Beatrix Shipper on 05/28/2011   Name of Pharmacy Updated in Demographics: yes    Name and phone number of pharmacy if not in Epic Database:   [facility] #[id number] -- [address] -- [place]

## 2011-05-19 NOTE — Telephone Encounter (Signed)
Chart reviewed. Patient last seen in clinic in 05/12/11 and was prescribed Norco 7.5/325 q6h PRN #60 to avoid overdosing on acetaminophen with frequent Vicodin use.

## 2011-05-19 NOTE — Telephone Encounter (Signed)
Last refilled: 05/12/11 disp. #60x0 refills  Recent ED visits: 05/01/11 dx: acute ischemic colitis  Comment: routing to Dr. Anselm Jungling for review.   Lab Results   Component Value Date    NA 137 05/06/2011    K 4.1 05/06/2011    CL 104 05/06/2011    BICARB 23 05/06/2011    BUN 7 05/06/2011    CREAT 0.45 05/06/2011    GLU 157 05/06/2011    [place] 9.5 05/06/2011

## 2011-05-21 ENCOUNTER — Encounter (HOSPITAL_BASED_OUTPATIENT_CLINIC_OR_DEPARTMENT_OTHER): Admitting: Gastroenterology

## 2011-05-21 ENCOUNTER — Telehealth (INDEPENDENT_AMBULATORY_CARE_PROVIDER_SITE_OTHER): Payer: Self-pay | Admitting: Internal Medicine

## 2011-05-21 NOTE — Telephone Encounter (Signed)
Did not realize that encounter needs to be a telephone encounter rather than a refill encounter to route to Triage. Discussed w/ Katherine Church and will send to [facility] for review in A.M.        Katherine Church, LVN 05/21/2011 3:10 PM Signed   Routing to [place] for review. Pt. Having breakthrough pain not controlled by Vicodin. Received refill request:  Katherine Church, LVN 05/21/2011 9:08 AM Addendum   Pt. Called about refill for Norco due tomorrow. Pt. Is having abdominal pain but understands not to exceed 6 tabs daily. She has enough for today but would refill starting tomorrow. She is aware she has appt. W/ Katherine Church on 05/28/11. Advised I would route to Katherine Church whom is familiar w/ pt.s problem and saw pt. On 05/12/11. Advised I would call her when approved. Routing and texting to Katherine Church.   Previous Version    Katherine Church, [place] 05/19/2011 10:52 PM Signed   Chart reviewed. Patient last seen in clinic in 05/12/11 and was prescribed Norco 7.5/325 q6h PRN #60 to avoid overdosing on acetaminophen with frequent Vicodin use.   Katherine Church, LVN 05/19/2011 1:19 PM Signed   Last refilled: 05/12/11 disp. #60x0 refills   Recent ED visits: 05/01/11 dx: acute ischemic colitis   Comment: routing to Katherine Church for review.   Lab Results    Component  Value  Date     NA  137  05/06/2011     K  4.1  05/06/2011     CL  104  05/06/2011     BICARB  23  05/06/2011     BUN  7  05/06/2011     CREAT  0.45  05/06/2011     GLU  157  05/06/2011     [place]  9.5  05/06/2011        Katherine Church 05/19/2011 11:19 AM Signed   Verbally confirmed name of Primary Care Provider: yes   Last visit Date: 05/12/2011   Medications:   HYDROCODONE-ACETAMINOPHEN 7.5-325 MG   Pt requesting medication until next appt with Katherine Church on 05/28/2011   Name of Pharmacy Updated in Demographics: yes   Name and phone number of pharmacy if not in Epic Database:   [facility] #[id number] -- [address] -- [place]

## 2011-05-21 NOTE — Telephone Encounter (Addendum)
Pt. Called about refill for Norco due tomorrow.  Pt. Is having abdominal pain but understands not to exceed 6 tabs daily. She has enough for today but would refill starting tomorrow.  She is aware she has appt. W/ Dr. Beatrix Shipper on 05/28/11. Advised I would route to Dr. Mila Palmer whom is familiar w/ pt.s problem and saw pt. On 05/12/11. Advised I would call her when approved.  Routing and texting to Dr. Mila Palmer.

## 2011-05-21 NOTE — Telephone Encounter (Signed)
Routing to [place] for review.  Pt. Having breakthrough pain not controlled by Vicodin.

## 2011-05-22 MED ORDER — OXYCODONE-ACETAMINOPHEN 5-325 MG OR TABS
1.0000 | ORAL_TABLET | Freq: Four times a day (QID) | ORAL | Status: DC | PRN
Start: 2011-05-22 — End: 2011-05-28

## 2011-05-22 NOTE — Telephone Encounter (Signed)
D/w patient.     Pt reports: spasms pain 8-9/10 with bowel movements. Return precautions from discharge were hydrate, call for blood in stool, unable to tolerate POs, severe pain 10/10 after analgesia. Pt was discharged Tuesday 05/06/2011 with morphine and percocet (see notes). She reports that at this time vicodin is not controlling pain at this time. Concern re too much tylenol. Pt is taking 2 tablets every 4-6 hours as needed (was dispensed 60 tablets x 10 days ago).    Pt is requesting alternative medication for pain control. She is aware she has an appointment Wednesday 05/28/2011 10:30 AM [place], MD  Pt is self pay patient.     Plan:    Will route to attending who saw pt with residents.  Will call with update in plan of care  Reviewed return precautions   Morphine is cheapeast but will be up to provider to prescribe.

## 2011-05-22 NOTE — Telephone Encounter (Signed)
Pt calling back to see if RX is available at this time. Pt is completely out of medications. Last pain medication was last evening.

## 2011-05-22 NOTE — Telephone Encounter (Signed)
Katherine Church, happy to help, can you ask pt, did she have pain control with Percocet? If not, I can prescribe MSIR, 15mg  prn, thanks, As  I am in clinic all AM, thanks<AS

## 2011-05-22 NOTE — Telephone Encounter (Signed)
Called pt to notify rx ready for refill. Please RT ED for precautions reviewed below. Verbalized understanding/agreeable.

## 2011-05-22 NOTE — Telephone Encounter (Signed)
Approved.percocet, # 35.  Please close encounter when done, thank you.AS

## 2011-05-22 NOTE — Telephone Encounter (Signed)
This encounter was handled by R.N. Judeth Cornfield and Attending M.D. Dr. Ileene Rubens.  Closing.

## 2011-05-22 NOTE — Telephone Encounter (Signed)
Spoke with [employer]. For MSIR and/or percocet same cost as self pay.   -----------------------------------------------------    D/w patient. She thinks percocet gave good pain control post d/c. If percocet does not work she will RT ED. Will fwd to MD

## 2011-05-22 NOTE — Telephone Encounter (Signed)
Pt upset in regards to Pt below. Pt states she is in pain and requesting medication today.

## 2011-05-28 ENCOUNTER — Ambulatory Visit (HOSPITAL_BASED_OUTPATIENT_CLINIC_OR_DEPARTMENT_OTHER): Payer: PRIVATE HEALTH INSURANCE | Admitting: Gastroenterology

## 2011-05-28 MED ORDER — OXYCODONE-ACETAMINOPHEN 5-325 MG OR TABS
1.0000 | ORAL_TABLET | Freq: Three times a day (TID) | ORAL | Status: DC | PRN
Start: 2011-05-28 — End: 2011-07-10

## 2011-05-28 MED ORDER — OMEPRAZOLE 20 MG OR CPDR
20.00 mg | DELAYED_RELEASE_CAPSULE | Freq: Every day | ORAL | Status: DC
Start: ? — End: 2015-01-05

## 2011-05-28 NOTE — Patient Instructions (Signed)
Please follow up with primary to request any other pain medications.

## 2011-05-30 ENCOUNTER — Other Ambulatory Visit (INDEPENDENT_AMBULATORY_CARE_PROVIDER_SITE_OTHER): Payer: Self-pay

## 2011-05-30 MED ORDER — LORAZEPAM 1 MG OR TABS
1.0000 mg | ORAL_TABLET | Freq: Three times a day (TID) | ORAL | Status: DC | PRN
Start: 2011-05-30 — End: 2011-07-09

## 2011-05-30 NOTE — Telephone Encounter (Signed)
Verbally confirmed name of Primary Care Provider: yes    Last visit Date: 05/12/2011    Medications:  LORAZEPAM 1 MG  Pt states pharmacy has been faxing refill request and completely out of medication   Name of Pharmacy Updated in Demographics: yes    Name and phone number of pharmacy if not in Epic Database:   [facility] #[id number] -- [address]

## 2011-05-30 NOTE — Telephone Encounter (Signed)
Last refilled: 04/23/11 disp.#20x0 refills  Recent ED visits: 05/01/11 dx: acute ischemic colitis  Comment: routing to Dr. Attending for review of time sensitive request.   Lab Results   Component Value Date    NA 137 05/06/2011    K 4.1 05/06/2011    CL 104 05/06/2011    BICARB 23 05/06/2011    BUN 7 05/06/2011    CREAT 0.45 05/06/2011    GLU 157 05/06/2011    [place] 9.5 05/06/2011

## 2011-05-30 NOTE — Telephone Encounter (Signed)
Called [facility] and advised refill for Ativan 1mg  tabs written 05/30/11 per Dr. Lonzo Cloud.  Advised to call pt. When ready for p/u. Closing.

## 2011-05-31 NOTE — Progress Notes (Signed)
Visit practitioner:  Leda Roys Copur-Dahi    Date / Time: 05/28/2011 11:55 PM    Referring Provider: No ref. provider found     Reason for Visit: f/u after hospital d/c    Type of Visit: Follow up    History of Present Illness:   Katherine Church is a 45 year old female patient with multiple hospital stays for ischemic colitis is here for f/u after recent hospitalization. See my last note for the details.    Was admitted to hospital b/w 9/20 and 9/25 after presenting with """acute, episodic epigastric and RLQ abdominal pain and spasms partially alleviated by the passage of loose stools and hematochezia, as well as nausea and vomiting. She has a hx of ischemic colitis diagnosed in 08/2009 and was last hospitalized from 8/28-04/19/11 with similar symptoms and a diagnosis of NSAID colopathy. Following her last hospitalization, she stopped taking NSAIDs and increased her exercise without alleviation of symptoms.Her CT abdomen/pelvisin 9/12 showed no evidence of colitis or bowel obstruction. Colonoscopy showed sigmoid inflammation consistent with colonic ischemia, but no strictures or ulcerations.  IBD was less likely based on colonoscopy and biopsies. Inpt GI team wasconsulted on the pt, and diagnosed her with ischemic colitis based on her history as well as elevated lactate (32.6) and WBC (10.1) on admission. 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 Other DRVVT tests, AT III, and protein C/S tests were within normal limits""".    Colonoscopy (05/02/11):   Predominantly sigmoid inflammation consistent with colon ischemia in setting of history, and previously elevated lactate. There was no stricture or ulceration seen. Follow up biopsies to help exclude less likely Crohn's colitis.      FINAL PATHOLOGIC DIAGNOSIS:   A: Colon, right, biopsy   -Focal minimal acute colitis, see comment.   B: Colon, 30 to 35 cm, biopsy   -Benign colonic mucosa with focal minimal acute colitis and lamina   propria fibrosis, see comment.   C: Rectosigmoid colon, biopsy   -Focal mild acute colitis, see comment.   COMMENT: For parts A, B, and C, there is focal acute cryptitis without   evidence of architectural distortion. The differential diagnosis includes   an acute infectious colitis, ischemic colitis, drug injury including   NSAIDs, early changes of inflammatory bowel disease, and bowel preparation   injury. For part B, there are foci of lamina propria fibrosis with focal   withered appearing glands. The findings are suggestive of ischemia. These   superficial mucosal biopsies do not show larger vessels to evaluate for   vasculitis or thrombosis. Please correlate clinically.    CT abdomen and pelvis with contrast (05/01/11):   1. No radiographic evidence of colitis or bowel obstruction. The sigmoid   colon is nondistended, limiting evaluation for bowel wall thickening. However,  no pericolonic inflammatory changes are seen.   2. 1 cm nodule in the left adrenal gland, nonspecific, but appears to be   stable dating back to CT scan dated 09/02/2009.   3. Mildly prominent right iliac chain lymph nodes are stable from prior exam.  4. New mild nonspecific posterior groundglass opacities in the lung bases which could represent atelectasis or developing patchy areas of consolidation.    She was d/c on narcotic pain meds which she takes tid and requesting refill. Last was refilled by pcp last  week.     She is doing well regarding constipation. Takes Miralax and has bms, very happy with the response.    Thinks that her bp may have been lower and decreased clonidine to bid from tid since d/c.     Patient Active Problem List   Diagnoses Date Noted   . Tubular adenoma polyp of rectum 04/26/2011      During colonoscopy (03/2011) a  biopsy of rectosigmoid region revealed a tubular adenoma. She was advised to follow up with GI for a repeat colonoscopy in 3-5 years.       . Myofascial pain 10/07/2010   . Piriformis syndrome 10/07/2010   . Screening for breast cancer 03/02/2010   . Screening for malignant neoplasm of breast 09/14/2009   . Acute ischemic colitis 09/08/2009   . Malignant hypertension 05/22/2009   . Retinal hemorrhage 05/22/2009   . Diabetes 05/18/2009   . Hypertensive disorder 05/18/2009   . Chronic back pain 05/18/2009   . Anxiety 05/18/2009       Past Medical History   Diagnosis Date   . Back pain    . High blood pressure    . Diabetes mellitus    . Hyperlipidemia LDL goal < 100    . Colitis, ulcerative        Past Surgical History   Procedure Date   . Anesth,tubal ligation/transection 1994   . Colonoscopy 04/2011     Repeat surveillance colo in 2019       Current Medications:    omeprazole (PRILOSEC) 20 MG capsule Take 20 mg by mouth daily.   oxyCODONE-acetaminophen (PERCOCET) 5-325 MG per tablet Take 1 tablet by mouth every 8 hours as needed for Severe Pain (Pain Score 7-10).   atorvastatin (LIPITOR) 20 MG tablet Take 1 tablet by mouth daily.   metoclopramide (REGLAN) 10 MG tablet Take 1 tablet by mouth 3 times daily.   clonidine (CATAPRES) 0.3 MG tablet Take 1 tablet by mouth 3 times daily.   carvedilol (COREG) 25 MG tablet Take 0.5 tablets by mouth 2 times daily.   amLODIPINE (NORVASC) 10 MG tablet Take 1 tablet by mouth daily.   clonidine (CATAPRES) 0.3 MG tablet Take 1 tablet by mouth 3 times daily.   amLODIPINE (NORVASC) 10 MG tablet Take 1 tablet by mouth daily.   lisinopril (PRINIVIL, ZESTRIL) 40 MG tablet Take 1 tablet by mouth 2 times daily.   senna (SENOKOT) 8.6 MG tablet Take 1 tablet by mouth daily.   polyethylene glycol (GLYCOLAX) powder Take 17 g by mouth daily.   LORazepam (ATIVAN) 1 MG tablet Take 1 tablet by mouth every 8 hours as needed for Anxiety.   hydrocodone-acetaminophen (NORCO) 7.5-325 MG per tablet Take  1 tablet by mouth every 6 hours as needed for Moderate Pain (Pain Score 4-6). No more then 8 tabs a day   hydrocodone-acetaminophen (VICODIN ES) 7.5-750 MG per tablet Take 1 tablet by mouth every 6 hours as needed for Moderate Pain (Pain Score 4-6) or Severe Pain (Pain Score 7-10).   DISCONTD: Calcium Carbonate Antacid (TUMS PO)    DISCONTD: carvedilol (COREG) 25 MG tablet Take 1 tablet by mouth 2 times daily.   DISCONTD: clonidine (CATAPRES) 0.3 MG tablet Take 1 tablet by mouth 3 times daily.   DISCONTD: amLODIPINE (NORVASC) 10 MG tablet Take 1 tablet by mouth daily.       Allergies:  Allergies   Allergen Reactions   . Ciprofloxacin Rash   . Flagyl (Metronidazole Hcl) Rash   .  Tetanus Toxoids Swelling       Family History:  Family History   Problem Relation Age of Onset   . Diabetes Father    . Lipids Father    . Hypertension Father    . Hypertension Mother        History     Social History   . Marital Status: Single     Spouse Name: N/A     Number of Children: 2   . Years of Education: N/A     Occupational History   . unemployed      Social History Main Topics   . Smoking status: Never Smoker    . Smokeless tobacco: Never Used   . Alcohol Use: No   . Drug Use: No      remote history of cocaine.  Currently occasional MJ smoking   . Sexually Active: Yes -- Female partner(s)      tubal ligation     Other Topics Concern   . Blood Transfusions No   . Special Diet No   . Exercise Yes     tries to walk     Social History Narrative    Two kids-adult in [place] (going thru med school [place]); son still deciding.Born and raised in [place].Moved to [facility] in august 2010-boyfriend works for [employer].       Physical examination:   BP 138/98  Pulse 92  Temp(Src) 98.9 F (37.2 C) (Oral)  Ht 5\' 4"  (1.626 m)  Wt 78.019 kg (172 lb)  BMI 29.52 kg/m2  SpO2 98%  LMP 01/10/2011 Body mass index is 29.52 kg/(m^2).  Wt Readings from Last 2 Encounters:   05/28/11 78.019 kg (172 lb)   05/12/11 78.926 kg (174 lb)    Blood Pressure    05/28/11 138/98   05/12/11 135/90      Pain Score: 4  General:  Well developed, well nourished in no apparent distress.  Affect:  Normal  HEENT:  No scleral icterus.  Lungs:  Normal respiratory effort.  Abdomen:  Abdomen soft, non tender, bowel sound present.  Extremities:  No peripheral edema. Warm well-perfused.  Skin:  Non-icteric and no visible rash  Neuro:  Alert oriented X3  Psych: No anxiety or depression. Normal affect.    Lab Results  Lab Results   Component Value Date    WBC 10.5 05/06/2011    RBC 4.42 05/06/2011    HGB 12.3 05/06/2011    HCT 36.6 05/06/2011    MCV 82.8 05/06/2011    MCHC 33.6 05/06/2011    RDW 13.1 05/06/2011    PLT 196 05/06/2011    PLT 219 05/21/2009    MPV 9.5 05/06/2011     Lab Results   Component Value Date    BUN 7 05/06/2011    CREAT 0.45 05/06/2011    CL 104 05/06/2011    NA 137 05/06/2011    K 4.1 05/06/2011     9.5 05/06/2011    TBILI 0.4 05/05/2011    ALB 4.3 05/05/2011    TP 7.8 05/05/2011    AST 18 05/05/2011    ALK 70 05/05/2011    BICARB 23 05/06/2011    ALT 28 05/05/2011    GLU 157 05/06/2011     Lab Results   Component Value Date    AST 18 05/05/2011    ALT 28 05/05/2011    ALK 70 05/05/2011    TP 7.8 05/05/2011    ALB 4.3 05/05/2011  TBILI 0.4 05/05/2011    DBILI 0.1 05/05/2011       Impression and plan:   1. Ischemic colitis (557.9)  MR ANGIOGRAM ABDOMEN WO/W CONTRAST, CONSULT/REFERRAL TO RHEUMATOLOGY   2. Tubular adenoma polyp of rectum (211.4)     3. Abdominal pain (789.00)  MR ANGIOGRAM ABDOMEN WO/W CONTRAST, CONSULT/REFERRAL TO RHEUMATOLOGY, oxyCODONE-acetaminophen (PERCOCET) 5-325 MG per tablet   4. Myofascial pain (729.1)  oxyCODONE-acetaminophen (PERCOCET) 5-325 MG per tablet   5. Lupus anticoagulant positive (795.79)  MR ANGIOGRAM ABDOMEN WO/W CONTRAST, CONSULT/REFERRAL TO RHEUMATOLOGY     Umeko Gwiazdowski is a 45 year old female with hx of DM, HTN and episode of ischemic colitis in 2011, and then repeated episodes in similar nature since. Her c/o pain are in the RLQ, but colonoscopy  04/16/11 and repeat in 9/21 with completed bowel preparation showed lesions on the left side: focal superficial small ulcers in the rectum, rectosigmoid junction, distal sigmoid colon and splenic flexure with normal appearing mucosa in-between these segments. EGD/ colonoscopy and biopsy results (above) did not show any e/o IBD. Her MR enterography showed hyperenhancement of the mucosa in the proximal sigmoid colon which may be on the basis of decompression of this segment of bowel versus mucosal inflammation. She did not have profuse diarrhea, her C diff PCR is neg, and stool was negative for organisms and O&P, making infection less likely. She did, however, have an elevated CRP and ESR.   No chronic GI inflammation but looks like acute ischemic injury on each occasions. ? Initial episodes were nsaid colopathy related too. Now off nsaids since last mo and still had mucosal changes, more s/o ischemic injury.   DDx include vasculitis, hypercoagulable state, other vascular disease with embolic events etc.    - abstain from NSAIDs   - cont Benefiber/Citrucel, Miralax BID or as needed. Can use Milk of Magnesia prn   - counsel pt on diet/nutrition, review handouts   - surveillance colonoscopy 7 years, as a tubular adenoma was found  - Avoid hypotension.  - Stay hydrated.  - Rheumatology consultation to evaluate for possibility of vasculitis, and for weak + lupus anticoagulant.   - Obtain MR-angio to examine the abdominal vasculature for obstructive disease.   - Gave one time only 25 pills prescription for percocet (5/325) as she claims to still have abdominal pain and ran out of meds today. She understood that I will not be writing for any more of the narcotic pain meds. She will need to ask her pcp whether they still think she should be on them. There is no specific GI disease to initiate the symptoms but this is more of an end result with decreased blood supply (from a yet unidentified cause) to the colon. I dont think  narcotic pain meds use is necessary for "ischemic colitis" diagnosis. She may have some other reason as the cause of this significant pain, like referred pain or myofascial pain syndrome related.     She can call us to learn the results then follow-up with primary care provider.

## 2011-06-02 ENCOUNTER — Other Ambulatory Visit (INDEPENDENT_AMBULATORY_CARE_PROVIDER_SITE_OTHER): Payer: Self-pay | Admitting: Internal Medicine

## 2011-06-02 NOTE — Telephone Encounter (Signed)
Verbally confirmed name of Primary Care Provider: yes    Last visit Date: 05/12/2011    Medications:  OXYCODONE-ACETAMINOPHEN 5-325 MG     Pt states GI MD will not be available until Nov 5 and Pt requesting PCP to fill medication. Pt states is will be out of medication this evening. Please call Pt when ready for pick up.     Name of Pharmacy Updated in Demographics: yes    Name and phone number of pharmacy if not in Epic Database:

## 2011-06-02 NOTE — Telephone Encounter (Signed)
Needs appt to review.  Can not refill as new narcotic.  We have filled norco in past.    Looks like her insurance is CMS now so not sure she can be seen here.

## 2011-06-02 NOTE — Telephone Encounter (Signed)
Received refill request: pt. Was working w/ R.N. Judeth Cornfield for previous refills.  Pt. Last received refill on 05/28/11 disp.#25x0 refills.  Pt. Is taking 6 tabs daily for acute ischemic colitis.   Last Visit: 05/12/11 Dr. Mila Palmer.  Pt. Did f/u with G.[facility]. On 05/28/11.   New Visit: none  Last refilled: 05/28/11 disp.#25x0 refills  Recent ED visits: 05/01/11 dx: acute ischemic colitis w/ admission  Comment: routing to Dr. Lonzo Cloud for review. Triplicate folder placed on desk.   Lab Results   Component Value Date    NA 137 05/06/2011    K 4.1 05/06/2011    CL 104 05/06/2011    BICARB 23 05/06/2011    BUN 7 05/06/2011    CREAT 0.45 05/06/2011    GLU 157 05/06/2011    [place] 9.5 05/06/2011

## 2011-06-03 ENCOUNTER — Telehealth (INDEPENDENT_AMBULATORY_CARE_PROVIDER_SITE_OTHER): Payer: Self-pay | Admitting: Internal Medicine

## 2011-06-03 ENCOUNTER — Encounter (INDEPENDENT_AMBULATORY_CARE_PROVIDER_SITE_OTHER): Payer: Self-pay | Admitting: Internal Medicine

## 2011-06-03 ENCOUNTER — Other Ambulatory Visit: Payer: Self-pay

## 2011-06-03 ENCOUNTER — Ambulatory Visit (INDEPENDENT_AMBULATORY_CARE_PROVIDER_SITE_OTHER): Payer: PRIVATE HEALTH INSURANCE | Admitting: Internal Medicine

## 2011-06-03 MED ORDER — OXYCODONE HCL 10 MG OR TB12
10.0000 mg | ORAL_TABLET | Freq: Two times a day (BID) | ORAL | Status: DC
Start: 2011-06-03 — End: 2011-06-05

## 2011-06-03 NOTE — Telephone Encounter (Signed)
If pt. Cannot be seen here can you provide pt. With proper alternatives for continued care. Routing to R.N. Judeth Cornfield for review.

## 2011-06-03 NOTE — Telephone Encounter (Signed)
Patient is requesting to speak to stephanie, she states she knows her history and knows what's going on with her. Please call patient at [phone]

## 2011-06-03 NOTE — Progress Notes (Signed)
R2 Clinic Note    HPI:    Katherine Church is a 45 year old female with h/o HTN, DM, HL presents for hospital f/u. Pt was recently hospitalized for acute abdominal pain and diagnosed with ischemic colitis based on leukocytosis, elevated lactate, and colonoscopy with sigmoid inflammation c/w colonic ischemia. She was hospitalized 1 month previously for acute abdominal pain thought to be 2/2 heavy NSAID use. Since discharge, pt was seen in this clinic on 05/12/11 and given norco was ongoing constant abdominal pain. On 05/22/11, she received 35 percocet as a refill. On 10/17, she saw GI and they gave her 25 percocet. GI recommended an MR angio and said that her pain is most likely not from ischemic colitis and may be from referred pain or myofascial pain syndrome. Pt says she continues to have constant 6-7/10 pain that is partially relieved by percocet. Pt reports using 3-4 tabs daily but based on our prescriptions it seems she is taking about 8/day. No radiation. No exacerbation with foods. No n/v. No diarrhea. No constipation.    PMH:  Past Medical History   Diagnosis Date   . Back pain    . High blood pressure    . Diabetes mellitus    . Hyperlipidemia LDL goal < 100    . Colitis, ulcerative      Meds:  Current Outpatient Prescriptions   Medication Sig   . LORazepam (ATIVAN) 1 MG tablet Take 1 tablet by mouth every 8 hours as needed for Anxiety.   Marland Kitchen omeprazole (PRILOSEC) 20 MG capsule Take 20 mg by mouth daily.   Marland Kitchen oxyCODONE-acetaminophen (PERCOCET) 5-325 MG per tablet Take 1 tablet by mouth every 8 hours as needed for Severe Pain (Pain Score 7-10).   Marland Kitchen atorvastatin (LIPITOR) 20 MG tablet Take 1 tablet by mouth daily.   . metoclopramide (REGLAN) 10 MG tablet Take 1 tablet by mouth 3 times daily.   . hydrocodone-acetaminophen (NORCO) 7.5-325 MG per tablet Take 1 tablet by mouth every 6 hours as needed for Moderate Pain (Pain Score 4-6). No more then 8 tabs a day   . clonidine (CATAPRES) 0.3 MG tablet Take 1 tablet  by mouth 3 times daily.   . carvedilol (COREG) 25 MG tablet Take 0.5 tablets by mouth 2 times daily.   Marland Kitchen amLODIPINE (NORVASC) 10 MG tablet Take 1 tablet by mouth daily.   . clonidine (CATAPRES) 0.3 MG tablet Take 1 tablet by mouth 3 times daily.   Marland Kitchen amLODIPINE (NORVASC) 10 MG tablet Take 1 tablet by mouth daily.   Marland Kitchen lisinopril (PRINIVIL, ZESTRIL) 40 MG tablet Take 1 tablet by mouth 2 times daily.   . hydrocodone-acetaminophen (VICODIN ES) 7.5-750 MG per tablet Take 1 tablet by mouth every 6 hours as needed for Moderate Pain (Pain Score 4-6) or Severe Pain (Pain Score 7-10).   Marland Kitchen senna (SENOKOT) 8.6 MG tablet Take 1 tablet by mouth daily.   . polyethylene glycol (GLYCOLAX) powder Take 17 g by mouth daily.   Marland Kitchen DISCONTD: Calcium Carbonate Antacid (TUMS PO)    . DISCONTD: carvedilol (COREG) 25 MG tablet Take 1 tablet by mouth 2 times daily.   Marland Kitchen DISCONTD: clonidine (CATAPRES) 0.3 MG tablet Take 1 tablet by mouth 3 times daily.   Marland Kitchen DISCONTD: amLODIPINE (NORVASC) 10 MG tablet Take 1 tablet by mouth daily.     Current Facility-Administered Medications   Medication   . lisinopril (PRINIVIL, ZESTRIL) tablet 40 mg     Allergies:  Allergies  Allergen Reactions   . Ciprofloxacin Rash   . Flagyl (Metronidazole Hcl) Rash   . Tetanus Toxoids Swelling       Review of Systems:  Additional ROS: 10 system ROS form reviewed with patient   Constitutional:denies fever, chills, night sweats, loss of energy or appetite   EYES: denies visual change, eye pain   ENT: denies oral lesions, sinus congestion or pain   HEART/LUNGS: denies orthopnea, shortness of breath, dyspnea on exertion, cough, chest pain, pedal edema   GI: denies nausea, vomiting, diarrhea, abdominal pain   GU: denies dysuria, difficulties in urination, incontinence   Neuro: denies new focal neurologic deficits, dizziness, syncope   Musculoskeletal: denies new or change in myalgias or arthralgias   Derm: denies new skin rashes  Psych: denies recent changes in mood, sleep or  stress level    BP 165/108  Pulse 94  Temp(Src) 98.2 F (36.8 C) (Oral)  Resp 16  Ht 5\' 4"  (1.626 m)  Wt 80.105 kg (176 lb 9.6 oz)  BMI 30.31 kg/m2  LMP 01/10/2011    Physical Exam:  NAD, pleasant  EOMI, PERRL, MMM, OP clear, tympanic membranes clear, nasal turbinates non-erythematous, no sinus tenderness, no LAD  RRR, nl s1, s2, no m/r/g, non-elevated JVP  CTAB, no w/c/r, non-labored breathing  +BS, soft, mild epigastric tenderness, ND, no r/g, no fluid wave, no HSM  No c/c/e  CNII-XII intact, 5/5 strength throughout UE and LE, nl sensation to lt touch and vibration throughout, 2+ DTRs throughout, no cerebellar deficits, nl finger to nose, nl heel to shin    Lab Results   Component Value Date    WBC 10.5 05/06/2011    RBC 4.42 05/06/2011    HGB 12.3 05/06/2011    HCT 36.6 05/06/2011    MCV 82.8 05/06/2011    MCHC 33.6 05/06/2011    RDW 13.1 05/06/2011    PLT 196 05/06/2011    PLT 219 05/21/2009    MPV 9.5 05/06/2011    SEG 69 05/06/2011    LYMPHS 23 05/06/2011    MONOS 4 05/06/2011    EOS 3 05/06/2011    BASOS 1 09/04/2009     Lab Results   Component Value Date    BUN 7 05/06/2011    CREAT 0.45 05/06/2011    CL 104 05/06/2011    NA 137 05/06/2011    K 4.1 05/06/2011    [place] 9.5 05/06/2011    TBILI 0.4 05/05/2011    ALB 4.3 05/05/2011    TP 7.8 05/05/2011    AST 18 05/05/2011    ALK 70 05/05/2011    BICARB 23 05/06/2011    ALT 28 05/05/2011    GLU 157 05/06/2011     Lab Results   Component Value Date    A1C 6.6 05/02/2011    A1C 6.6 04/11/2011    A1C 7.4 01/21/2011     Lab Results   Component Value Date    TSH 1.27 05/02/2010         Assessment & Plan:  45 year old female with h/o HTN, DM, HL presents for pain management.     # ischemic colitis - pt still w/ daily abdominal pain somewhat controlled by percocet. Per GI, her pain likely is not all from ischemic colitis as this condition is typically painless or minimally painful. GI suggests possible referred pain or myofascial pain syndrome. In any case, pt appears to be using 8 percocet/day.     - dc percocet  -  start oxycontin 10 mg PO BID  - pt signed pain contract  - Utox  - referral to pain clinic w/ Dr. Foye Clock  - pt given strict ED return precautions   - avoid hypotension  - encouraged pt to be diligent about obtaining MR angio as ordered by GI    # HTN - on recheck bp was 150/95. Given that it is essential to avoid hypotension in this patient, no changes will be made during this visit. However, this issue should be addressed at next visit.  - continue carvedilol 12.5 mg BID   - continue lisinopril 40 mg BID   - continue clonidine 0.3 mg TID   - continue amlodipine 10 mg daily     [address] [place]

## 2011-06-03 NOTE — Telephone Encounter (Signed)
Routing to [facility] for review. Can you advise pt. Which alternatives are available to her considering her insurance.  Katherine Church has advised pt. To go to E.R. If continued excessive use of Percocet ie. 6 tabs daily. If pt. Refuses to go to E.R.  Dr. Lonzo Cloud advising pt. To make appt. But if she is not covered by insurance pt. Would have to cover cost. Katherine Church is more familiar w/ pt. But is not in office for several more days.

## 2011-06-04 ENCOUNTER — Telehealth (INDEPENDENT_AMBULATORY_CARE_PROVIDER_SITE_OTHER): Payer: Self-pay | Admitting: Internal Medicine

## 2011-06-04 NOTE — Telephone Encounter (Signed)
Patient is requesting a status on the message below. Patient states she needs to get another pain medication that is cheaper than $150 dlls. Patient states oxycodone is too expensive and needs generic brand on Morphine.   Please call patient asap at [phone]

## 2011-06-04 NOTE — Telephone Encounter (Signed)
Pt states medication OXYCODONE is going to cost $150, pt requesting generic brand or Morphine. Please call Pt when complete.

## 2011-06-05 LAB — UR DRUGS OF ABUSE SCREEN
Amphetamines Screen: NEGATIVE
Barbiturates Screen: NEGATIVE
Benzodiazepine Screen: NEGATIVE
Cocaine Screen: NEGATIVE
Methadone Screen: NEGATIVE
Opiates Screen: POSITIVE
Oxycodone Screen: POSITIVE
Phencyclidine Screen: NEGATIVE
Propoxyphen: NEGATIVE
THC Screen: NEGATIVE

## 2011-06-05 LAB — CONF OXYCODONE, URINE

## 2011-06-05 MED ORDER — MORPHINE SULFATE CR 15 MG OR TB12
15.0000 mg | ORAL_TABLET | Freq: Two times a day (BID) | ORAL | Status: DC
Start: 2011-06-05 — End: 2011-07-09

## 2011-06-05 NOTE — Telephone Encounter (Signed)
Pt is requesting Morphine due to cost and no drug coverage. Medication was written for oxyCODONE (OXYCONTIN) 10 MG CR tablet 10/23.

## 2011-06-05 NOTE — Telephone Encounter (Signed)
Pt will bring in old triplicate, pick up new one today. Verbalized understanding of instructions.

## 2011-06-05 NOTE — Telephone Encounter (Signed)
Triplicate for percocet destroyed. Pt retrieved new rx. Matter resolved, closing.

## 2011-06-05 NOTE — Telephone Encounter (Signed)
D/w patient. She was seen 06/03/2011. Pt pcp is Dr. Mila Palmer she is a self pay patient. See open encounter.

## 2011-06-05 NOTE — Telephone Encounter (Signed)
Addressed in another encounter see: Telephone Encounter 06/04/2011

## 2011-06-05 NOTE — Telephone Encounter (Signed)
Ms contin 15 mg twice a day filled.  On my desk.

## 2011-06-08 LAB — CONFIRM OPIATES, URINE-SEND OUT
6 AM, Urine: <20 ng/mL
Codeine, Urine: <20 ng/mL
Hydromorphone, Urine: 87 ng/mL
Morphone, Urine: <20 ng/mL
Opiates, Urine: >4000 ng/mL
Oxycodone, Urine: <20 ng/mL
Oxymorphone, Urine: <20 ng/mL
Urine Interp: POSITIVE

## 2011-06-10 NOTE — Progress Notes (Signed)
Late note.  Patient seen on 06/03/11.    ATTENDING NOTE:    Chief Complaint   Patient presents with   . Recheck   . Refill Request       SUBJECTIVE:   I reviewed the history and interviewed the patient.    History of present illness (HPI):  Katherine Church is a 45 year old female who presents for follow-up recent hospitalization and chronic pain.    Review of Systems (ROS): As per  the resident's note.    Past Medical, Family, Social History:  Reviewed and updated as per resident note.    Current Outpatient Prescriptions   Medication Sig   . LORazepam (ATIVAN) 1 MG tablet Take 1 tablet by mouth every 8 hours as needed for Anxiety.   Marland Kitchen omeprazole (PRILOSEC) 20 MG capsule Take 20 mg by mouth daily.   Marland Kitchen oxyCODONE-acetaminophen (PERCOCET) 5-325 MG per tablet Take 1 tablet by mouth every 8 hours as needed for Severe Pain (Pain Score 7-10).   Marland Kitchen atorvastatin (LIPITOR) 20 MG tablet Take 1 tablet by mouth daily.   . metoclopramide (REGLAN) 10 MG tablet Take 1 tablet by mouth 3 times daily.   . hydrocodone-acetaminophen (NORCO) 7.5-325 MG per tablet Take 1 tablet by mouth every 6 hours as needed for Moderate Pain (Pain Score 4-6). No more then 8 tabs a day   . clonidine (CATAPRES) 0.3 MG tablet Take 1 tablet by mouth 3 times daily.   . carvedilol (COREG) 25 MG tablet Take 0.5 tablets by mouth 2 times daily.   Marland Kitchen amLODIPINE (NORVASC) 10 MG tablet Take 1 tablet by mouth daily.   . clonidine (CATAPRES) 0.3 MG tablet Take 1 tablet by mouth 3 times daily.   Marland Kitchen amLODIPINE (NORVASC) 10 MG tablet Take 1 tablet by mouth daily.   Marland Kitchen lisinopril (PRINIVIL, ZESTRIL) 40 MG tablet Take 1 tablet by mouth 2 times daily.   . hydrocodone-acetaminophen (VICODIN ES) 7.5-750 MG per tablet Take 1 tablet by mouth every 6 hours as needed for Moderate Pain (Pain Score 4-6) or Severe Pain (Pain Score 7-10).   Marland Kitchen senna (SENOKOT) 8.6 MG tablet Take 1 tablet by mouth daily.   . polyethylene glycol (GLYCOLAX) powder Take 17 g by mouth daily.   Marland Kitchen morphine  (ORAMORPH) 15 MG CR tablet Take 1 tablet by mouth every 12 hours.   Marland Kitchen DISCONTD: Calcium Carbonate Antacid (TUMS PO)    . DISCONTD: carvedilol (COREG) 25 MG tablet Take 1 tablet by mouth 2 times daily.   Marland Kitchen DISCONTD: clonidine (CATAPRES) 0.3 MG tablet Take 1 tablet by mouth 3 times daily.   Marland Kitchen DISCONTD: amLODIPINE (NORVASC) 10 MG tablet Take 1 tablet by mouth daily.     Current Facility-Administered Medications   Medication   . lisinopril (PRINIVIL, ZESTRIL) tablet 40 mg       OBJECTIVE: BP 165/108  Pulse 94  Temp(Src) 98.2 F (36.8 C) (Oral)  Resp 16  Ht 5\' 4"  (1.626 m)  Wt 80.105 kg (176 lb 9.6 oz)  BMI 30.31 kg/m2  LMP 01/10/2011    I have examined the patient and I concur with the resident's exam.    MEDICAL PLAN of CARE:   Assessment and plan reviewed with the resident physician.  I agree with the resident's plan as documented.    See the resident's note for further details.

## 2011-06-11 ENCOUNTER — Telehealth (INDEPENDENT_AMBULATORY_CARE_PROVIDER_SITE_OTHER): Payer: Self-pay | Admitting: Internal Medicine

## 2011-06-11 NOTE — Telephone Encounter (Signed)
Patient is requesting for Dr. Anselm Jungling to change her Morphine medication to Tramadol. Patient states Morphine is not working. Also, patient is requesting her Reglan medication to be refilled at [facility]. Please call patient at home number on file.

## 2011-06-12 ENCOUNTER — Encounter (INDEPENDENT_AMBULATORY_CARE_PROVIDER_SITE_OTHER): Payer: Self-pay | Admitting: Internal Medicine

## 2011-06-12 ENCOUNTER — Telehealth (INDEPENDENT_AMBULATORY_CARE_PROVIDER_SITE_OTHER): Payer: Self-pay | Admitting: Internal Medicine

## 2011-06-12 ENCOUNTER — Ambulatory Visit (INDEPENDENT_AMBULATORY_CARE_PROVIDER_SITE_OTHER): Payer: PRIVATE HEALTH INSURANCE | Admitting: Internal Medicine

## 2011-06-12 ENCOUNTER — Other Ambulatory Visit (INDEPENDENT_AMBULATORY_CARE_PROVIDER_SITE_OTHER): Payer: Self-pay | Admitting: Internal Medicine

## 2011-06-12 VITALS — BP 120/98 | HR 76 | Temp 98.6°F | Resp 16 | Ht 64.0 in | Wt 175.0 lb

## 2011-06-12 MED ORDER — TRAMADOL HCL 50 MG OR TABS
50.0000 mg | ORAL_TABLET | Freq: Three times a day (TID) | ORAL | Status: DC | PRN
Start: 2011-06-12 — End: 2011-07-09

## 2011-06-12 NOTE — Telephone Encounter (Signed)
Verbally confirmed name of Primary Care Provider: yes    What is reason for call: Pt states her BP has been low for the last couple of days and has not taken her BP medication. Pt is requesting to speak to triage nurse Judeth Cornfield regarding her BP issues. Please call pt at # in contacts.     Confirmed Contact Number:yes    This message will be transmitted to our triage nurse, you can expect a call by the end of the working day.

## 2011-06-12 NOTE — Telephone Encounter (Signed)
Pt. Has appt. W/ Dr. Ileene Rubens today 06/12/11.  Medications will be reviewed during visit. Closing.

## 2011-06-12 NOTE — Progress Notes (Signed)
Chief Complaint   Patient presents with   . Other     hypotension   . Other     discuss muscle twinge upper chest; L arm   . Nausea     began this AM       SUBJECTIVE::Katherine Church is a 45 year old female here for acute issue as above.  Pt is cared for by Dr. Anselm Jungling, I am seeing this patient for Dr. Anselm Jungling in his absence. Pt last seen, 06/03/11. At that time, she was hypertensive.  Please see multiple tele encounters since LCV. Pt well known to me.   States yesterday, feeling poorly, and so checking Bp, and running low 100's/70's. She cont'd to take her BP meds, 4 in all, and Bp, 97/70's.   She has not taken any BP meds today, and last noc, she only took clonidine and 1/2 carvedilol.   Today, she still feels lethargic, but less so. She also endorses spasms/twinges of discomfort of L chest. Twinge of pain described similar to when hitting "your funny bone."   No associated SOB, diaphoeresis, N/V during CP episodes. For last week, two times a day, mostly at noc, at rest. No increase in chest twinges when very active. Denies pressure. Lasts seconds only.    She still does not feel well, but a little better then yesterday.  Tearful, as she feels poorly and getting anxiety over her health.  She will be going back home to [place] for the holidays in 4 weeks and wants to feel well.    Ongoing, lower abd pain, no change. Please see Dr. Ilda Foil Dahi's thorough GI note, seen earlier this mo.   For pain, taking MS contin BID, "Ok for pain control."  Nausea and acid reflux sx's for last week. Pain, more epigastric and different then her usual lower abd pain (that is being wu currently).   Using OTC pepcid, has been helpful, but only taking QD, and wonders if can take BID.  She wouold like RFon tramadol.     Needs BMP, for Abd MRI s/f next week.    Re: insurance, covers hospital and ED but no clinic visits.    Patient Active Problem List   Diagnoses Date Noted   . Tubular adenoma polyp of rectum 04/26/2011      During colonoscopy  (03/2011) a biopsy of rectosigmoid region revealed a tubular adenoma. She was advised to follow up with GI for a repeat colonoscopy in 3-5 years.       . Myofascial pain 10/07/2010   . Piriformis syndrome 10/07/2010   . Screening for breast cancer 03/02/2010   . Screening for malignant neoplasm of breast 09/14/2009   . Acute ischemic colitis 09/08/2009   . Malignant hypertension 05/22/2009   . Retinal hemorrhage 05/22/2009   . Diabetes 05/18/2009   . Hypertensive disorder 05/18/2009   . Chronic back pain 05/18/2009   . Anxiety 05/18/2009     Past Medical History   Diagnosis Date   . Back pain    . High blood pressure    . Diabetes mellitus    . Hyperlipidemia LDL goal < 100    . Colitis, ulcerative        Social History:  History   Substance Use Topics   . Smoking status: Never Smoker    . Smokeless tobacco: Never Used   . Alcohol Use: No       Allergies:  Allergies   Allergen Reactions   . Ciprofloxacin Rash   .  Flagyl (Metronidazole Hcl) Rash   . Tetanus Toxoids Swelling       Current Outpatient Prescriptions   Medication Sig   . traMADol (ULTRAM) 50 MG tablet Take 1 tablet by mouth every 8 hours as needed for Moderate Pain (Pain Score 4-6).   Marland Kitchen morphine (ORAMORPH) 15 MG CR tablet Take 1 tablet by mouth every 12 hours.   Marland Kitchen LORazepam (ATIVAN) 1 MG tablet Take 1 tablet by mouth every 8 hours as needed for Anxiety.   Marland Kitchen omeprazole (PRILOSEC) 20 MG capsule Take 20 mg by mouth daily.   Marland Kitchen oxyCODONE-acetaminophen (PERCOCET) 5-325 MG per tablet Take 1 tablet by mouth every 8 hours as needed for Severe Pain (Pain Score 7-10).   Marland Kitchen atorvastatin (LIPITOR) 20 MG tablet Take 1 tablet by mouth daily.   . metoclopramide (REGLAN) 10 MG tablet Take 1 tablet by mouth 3 times daily.   . clonidine (CATAPRES) 0.3 MG tablet Take 1 tablet by mouth 3 times daily.   . carvedilol (COREG) 25 MG tablet Take 0.5 tablets by mouth 2 times daily.   Marland Kitchen amLODIPINE (NORVASC) 10 MG tablet Take 1 tablet by mouth daily.   Marland Kitchen lisinopril (PRINIVIL, ZESTRIL)  40 MG tablet Take 1 tablet by mouth 2 times daily.   . hydrocodone-acetaminophen (VICODIN ES) 7.5-750 MG per tablet Take 1 tablet by mouth every 6 hours as needed for Moderate Pain (Pain Score 4-6) or Severe Pain (Pain Score 7-10).   Marland Kitchen senna (SENOKOT) 8.6 MG tablet Take 1 tablet by mouth daily.   . polyethylene glycol (GLYCOLAX) powder Take 17 g by mouth daily.   Marland Kitchen DISCONTD: Calcium Carbonate Antacid (TUMS PO)    . hydrocodone-acetaminophen (NORCO) 7.5-325 MG per tablet Take 1 tablet by mouth every 6 hours as needed for Moderate Pain (Pain Score 4-6). No more then 8 tabs a day   . amLODIPINE (NORVASC) 10 MG tablet Take 1 tablet by mouth daily.   . clonidine (CATAPRES) 0.3 MG tablet Take 1 tablet by mouth 3 times daily.   Marland Kitchen DISCONTD: carvedilol (COREG) 25 MG tablet Take 1 tablet by mouth 2 times daily.   Marland Kitchen DISCONTD: clonidine (CATAPRES) 0.3 MG tablet Take 1 tablet by mouth 3 times daily.   Marland Kitchen DISCONTD: amLODIPINE (NORVASC) 10 MG tablet Take 1 tablet by mouth daily.     Current Facility-Administered Medications   Medication   . lisinopril (PRINIVIL, ZESTRIL) tablet 40 mg       ROS:12 point review of systems, reviewed and negative except as per HPI and PMHx.      OBJECTIVE:: BP 120/98  Pulse 76  Temp(Src) 98.6 F (37 C) (Oral)  Resp 16  Ht 5\' 4"  (1.626 m)  Wt 79.379 kg (175 lb)  BMI 30.04 kg/m2  LMP 06/02/2011  General Appearance: healthy, alert, no distress, pleasant affect, cooperative.  Eyes:  conjunctivae and corneas clear. PERRL, EOM's intact. Fundi benign.  Mouth: normal.  Neck:  Neck supple. No adenopathy, thyroid symmetric, normal size, no carotid bruits.  Heart:  normal rate and regular rhythm, no murmurs, clicks, or gallops.  Lungs: clear to auscultation and percussion, no chest deformities noted.  Abdomen: BS normal.  No peritoneal signs No masses or organomegaly.  Extremities:  no cyanosis, clubbing, or edema.    Recent labs, imaging and chart reviewed.  Results for CAMERY, DECENA (MRN  [id number]) as of 06/12/2011 11:17   Ref. Range 06/03/2011 17:30   Amphetamines As A Class Latest Range: Negative  Negative  Barbiturates As A Class Latest Range: Negative  Negative   Benzoylecgonine Latest Range: Negative  Negative   Benzodiazepines As A Class Latest Range: Negative  Negative   Methadone Latest Range: Negative  Negative   Opiates As A Class Latest Range: Negative  Positive   Phencyclidine Latest Range: Negative  Negative   Propoxyphen Latest Range: Negative  Negative   Tetrahydrocannabinoids Latest Range: Negative  Negative   Oxycodone Latest Range: Negative  Positive   Oxycodone Confirmation No range found See Opi CONF   Opiates, Urine No range found >4000   Oxycodone, Urine No range found <20   Oxymorphone, Urine No range found <20   Hydromorphone, Urine No range found 87   Codeine, Urine No range found <20   Morphone, Urine No range found <20   6 AM, Urine No range found <20   Urine Interp No range found Positive     A/P:  Quenia was seen today for above.    Diagnoses and associated orders for this visit:    Hypertensive disorder: No evidence of ishemia or volume overload, BP reasonable today. For now, will have pt decrease BP regimen and take:   carvedilol 12.5 mg BID    clonidine 0.3 mg BID  She will see Dr. Anselm Jungling in close FU.  Breast screening, unspecified  - Screening Mammogram Bilateral    Pain: Continue w/ conservative management and symptomatic treatment, as thorough eval in progress.   - traMADol (ULTRAM) 50 MG tablet; Take 1 tablet by mouth every 8 hours as needed for Moderate Pain (Pain Score 4-6).  - Basic Metabolic Panel, Blood Green Plasma Separator Tube; Future    Chest discomfort: sounds very atypical, reassurance, Will Follow expectantly.    Gerd (gastroesophageal reflux disease): sx's suggestive of increasing GERD. Given that OTC pepcid is helpful will escalate to BID.    Follow-up in 4 week(s) for close follow up of BP and abd eval, Dr. Anselm Jungling.  Patient Instruction:  See Patient  Education/Instruction section.    Patient was educated on clinical, laboratory and imaging findings.   Barriers to Learning assessed: none. All of the patients questions were answered to patient's satisfaction. Patient verbalizes understanding of teaching and instructions and is agreeable to above plan.

## 2011-06-12 NOTE — Telephone Encounter (Signed)
Patient calling with fatigue and hypotension.   Reports over the last 24 hours worsening fatigue.  Last evening BP 97/71 HR ?  This AM BP 107/71 HR ?  Concerned as she does not know which blood pressure medications to take; cannot keep SBP > 100s.  Fluid intake has decreased as she has had nausea within the last 24 hours.    Denies: dizziness, heart palpitations, chest pain, SOB, frank blood, black tarry stools, va/d/.    Scheduled: 06/12/2011 10:30 AM Ileene Rubens, MD

## 2011-06-12 NOTE — Patient Instructions (Addendum)
Please continue carvedilol 12.5 mg twice daily    Please continue clonidine 0.3 mg twice daily     You can increase the Pepcid complete to twice daily.     Please get your labs done today.    If you have any questions please do not hesitate to call me at [phone].    Thank you.

## 2011-06-17 ENCOUNTER — Other Ambulatory Visit (INDEPENDENT_AMBULATORY_CARE_PROVIDER_SITE_OTHER): Payer: Self-pay | Admitting: Internal Medicine

## 2011-06-17 NOTE — Telephone Encounter (Signed)
Erroneous encounter    Closing

## 2011-07-01 ENCOUNTER — Other Ambulatory Visit (INDEPENDENT_AMBULATORY_CARE_PROVIDER_SITE_OTHER): Payer: Self-pay | Admitting: Internal Medicine

## 2011-07-01 NOTE — Telephone Encounter (Signed)
Verbally confirmed name of Primary Care Provider: yes    Last visit Date: 06/12/2011    Medications:  HYDROCODONE-ACETAMINOPHEN 7.5-750 MG     Name of Pharmacy Updated in Demographics: yes    Name and phone number of pharmacy if not in Epic Database:   [facility] #[id number] -- [address] -

## 2011-07-01 NOTE — Telephone Encounter (Signed)
New Visit: 07/09/11 Dr. Mila Palmer  Last refilled: 05/12/11 disp.#60x0 refills  Recent ED visits: 05/01/11 dx: acute ischemic colitis  Comment: routing to Dr. Mila Palmer for review.   Lab Results   Component Value Date    NA 132 06/12/2011    K 4.4 06/12/2011    CL 95 06/12/2011    BICARB 24 06/12/2011    BUN 10 06/12/2011    CREAT 0.46 06/12/2011    GLU 227 06/12/2011    [place] 9.3 06/12/2011

## 2011-07-02 MED ORDER — HYDROCODONE-ACETAMINOPHEN 7.5-325 MG OR TABS
1.0000 | ORAL_TABLET | Freq: Four times a day (QID) | ORAL | Status: DC | PRN
Start: 2011-07-01 — End: 2011-07-09

## 2011-07-02 NOTE — Telephone Encounter (Signed)
No response routing to Attending.

## 2011-07-07 ENCOUNTER — Telehealth (INDEPENDENT_AMBULATORY_CARE_PROVIDER_SITE_OTHER): Payer: Self-pay | Admitting: Internal Medicine

## 2011-07-07 NOTE — Telephone Encounter (Signed)
Pt states pharmacy has not received order for hydrocodone-acetaminophen (NORCO) 7.5-325 MG per tablet. Please call pharmacy or re-send.

## 2011-07-07 NOTE — Telephone Encounter (Signed)
[facility] is calling to verify prescription for Norco, please call pharmacy.

## 2011-07-07 NOTE — Telephone Encounter (Signed)
Placed call to pharmacy. Verified rx. Closing

## 2011-07-09 ENCOUNTER — Ambulatory Visit (INDEPENDENT_AMBULATORY_CARE_PROVIDER_SITE_OTHER): Payer: PRIVATE HEALTH INSURANCE | Admitting: Internal Medicine

## 2011-07-09 VITALS — BP 142/102 | HR 76 | Temp 98.3°F | Resp 14 | Ht 64.0 in | Wt 174.0 lb

## 2011-07-09 DIAGNOSIS — K5900 Constipation, unspecified: Secondary | ICD-10-CM

## 2011-07-09 MED ORDER — CLONIDINE HCL 0.3 MG OR TABS
0.30 mg | ORAL_TABLET | Freq: Two times a day (BID) | ORAL | Status: DC
Start: 2011-07-09 — End: 2012-01-14

## 2011-07-09 MED ORDER — ONDANSETRON HCL 4 MG OR TABS
4.0000 mg | ORAL_TABLET | Freq: Three times a day (TID) | ORAL | Status: DC | PRN
Start: 2011-07-09 — End: 2011-11-03

## 2011-07-09 MED ORDER — HYDROCODONE-ACETAMINOPHEN 7.5-325 MG OR TABS
1.0000 | ORAL_TABLET | Freq: Three times a day (TID) | ORAL | Status: DC | PRN
Start: 2011-07-16 — End: 2011-09-05

## 2011-07-09 MED ORDER — TRAMADOL HCL 50 MG OR TABS
50.0000 mg | ORAL_TABLET | Freq: Three times a day (TID) | ORAL | Status: DC | PRN
Start: 2011-07-09 — End: 2011-07-10

## 2011-07-09 MED ORDER — LORAZEPAM 1 MG OR TABS
1.0000 mg | ORAL_TABLET | Freq: Three times a day (TID) | ORAL | Status: DC | PRN
Start: 2011-07-09 — End: 2011-07-29

## 2011-07-09 MED ORDER — DOCUSATE SODIUM 250 MG OR CAPS
250.00 mg | ORAL_CAPSULE | Freq: Two times a day (BID) | ORAL | Status: AC
Start: 2011-07-09 — End: ?

## 2011-07-09 MED ORDER — SENNA 8.6 MG OR TABS
8.6000 mg | ORAL_TABLET | Freq: Every day | ORAL | Status: DC
Start: 2011-07-09 — End: 2012-10-06

## 2011-07-09 MED ORDER — TRAMADOL HCL 50 MG OR TABS
50.0000 mg | ORAL_TABLET | ORAL | Status: DC
Start: 2011-07-09 — End: 2011-08-08

## 2011-07-10 NOTE — Progress Notes (Signed)
R2 Clinic Note    HPI:  Shalyce Beland is a 45 year old female with h/o HTN, DM, HL presents for medication refills. Pt was recently diagnosed with ischemic colitis based on leukocytosis, elevated lactate, and colonoscopy with sigmoid inflammation c/w colonic ischemia. She was hospitalized 1 month previously for acute abdominal pain thought to be 2/2 heavy NSAID use. Since discharge, has been having ongoing issues with abdominal pain and labile blood pressures. Had been taking over 8 percocet/day so she was started on MS contin 15 BID. She said this did a good job of controlling pain but she did not like how it made her itchy so she stopped taking it. She then called into clinic and was given a prescription for norco. Currently, she takes 3 norco/day and up to 6 tramadol/day w/ good relief of pain. Her other issue is labile blood pressures. She was initially on carvedilol 12.5 mg BID, clonidine 0.3 mg TID, lisinipril 40, and amlodipine 10. A couple weeks ago, she came into clinic b/c she had systolic bps in the 90s w/ symptomatic fatigue. At that time, lisinopril and amlodipine were dc'ed. Pt then kept a bp log and saw that her systolic blood pressure increased to 170s. She thus restarted lisinopril and since then her bp have been in the 120-140 range.    PMH:  Past Medical History   Diagnosis Date   . Back pain    . High blood pressure    . Diabetes mellitus    . Hyperlipidemia LDL goal < 100    . Colitis, ulcerative      Meds:  Current Outpatient Prescriptions   Medication Sig   . ondansetron (ZOFRAN) 4 MG tablet Take 1 tablet by mouth every 8 hours as needed for Nausea/Vomiting.   Marland Kitchen LORazepam (ATIVAN) 1 MG tablet Take 1 tablet by mouth every 8 hours as needed for Anxiety.   . clonidine (CATAPRES) 0.3 MG tablet Take 1 tablet by mouth 2 times daily.   . traMADol (ULTRAM) 50 MG tablet Take 1 tablet by mouth As Directed. 1 - 2 tabs every 8 hours   . hydrocodone-acetaminophen (NORCO) 7.5-325 MG per tablet Take 1  tablet by mouth every 8 hours as needed for Moderate Pain (Pain Score 4-6). No more then 8 tabs a day   . senna (SENOKOT) 8.6 MG tablet Take 1 tablet by mouth daily.   Marland Kitchen docusate sodium (COLACE) 250 MG capsule Take 1 capsule by mouth 2 times daily.   . metoclopramide (REGLAN) 10 MG tablet Take 1 tablet by mouth 3 times daily.   . carvedilol (COREG) 25 MG tablet Take 0.5 tablets by mouth 2 times daily.   Marland Kitchen lisinopril (PRINIVIL, ZESTRIL) 40 MG tablet Take 1 tablet by mouth 2 times daily.   . polyethylene glycol (GLYCOLAX) powder Take 17 g by mouth daily.   Marland Kitchen DISCONTD: Calcium Carbonate Antacid (TUMS PO)    . DISCONTD: carvedilol (COREG) 25 MG tablet Take 1 tablet by mouth 2 times daily.   Marland Kitchen DISCONTD: clonidine (CATAPRES) 0.3 MG tablet Take 1 tablet by mouth 3 times daily.   Marland Kitchen DISCONTD: amLODIPINE (NORVASC) 10 MG tablet Take 1 tablet by mouth daily.   Marland Kitchen omeprazole (PRILOSEC) 20 MG capsule Take 20 mg by mouth daily.   Marland Kitchen oxyCODONE-acetaminophen (PERCOCET) 5-325 MG per tablet Take 1 tablet by mouth every 8 hours as needed for Severe Pain (Pain Score 7-10).   Marland Kitchen atorvastatin (LIPITOR) 20 MG tablet Take 1 tablet by mouth  daily.   . amLODIPINE (NORVASC) 10 MG tablet Take 1 tablet by mouth daily.   Marland Kitchen amLODIPINE (NORVASC) 10 MG tablet Take 1 tablet by mouth daily.   Marland Kitchen senna (SENOKOT) 8.6 MG tablet Take 1 tablet by mouth daily.     Current Facility-Administered Medications   Medication   . lisinopril (PRINIVIL, ZESTRIL) tablet 40 mg     Allergies:  Allergies   Allergen Reactions   . Ciprofloxacin Rash   . Flagyl (Metronidazole Hcl) Rash   . Tetanus Toxoids Swelling     Review of Systems:  Additional ROS: 10 system ROS form reviewed with patient   Constitutional:denies fever, chills, night sweats, loss of energy or appetite   EYES: denies visual change, eye pain   ENT: denies oral lesions, sinus congestion or pain   HEART/LUNGS: denies orthopnea, shortness of breath, dyspnea on exertion, cough, chest pain, pedal edema   GI:  denies nausea, vomiting, diarrhea, abdominal pain   GU: denies dysuria, difficulties in urination, incontinence   Neuro: denies new focal neurologic deficits, dizziness, syncope   Musculoskeletal: denies new or change in myalgias or arthralgias   Derm: denies new skin rashes  Psych: denies recent changes in mood, sleep or stress level    BP 142/102  Pulse 76  Temp(Src) 98.3 F (36.8 C) (Oral)  Resp 14  Ht 5\' 4"  (1.626 m)  Wt 78.926 kg (174 lb)  BMI 29.87 kg/m2  SpO2 95%  LMP 06/02/2011    Physical Exam:  NAD, pleasant   EOMI, PERRL, MMM, OP clear, tympanic membranes clear, nasal turbinates non-erythematous, no sinus tenderness, no LAD   RRR, nl s1, s2, no m/r/g, non-elevated JVP   CTAB, no w/c/r, non-labored breathing   +BS, soft, mild epigastric tenderness, ND, no r/g, no fluid wave, no HSM   No c/c/e   CNII-XII intact, 5/5 strength throughout UE and LE, nl sensation to lt touch and vibration throughout, 2+ DTRs throughout, no cerebellar deficits, nl finger to nose, nl heel to shin     Lab Results   Component Value Date    WBC 10.5 05/06/2011    RBC 4.42 05/06/2011    HGB 12.3 05/06/2011    HCT 36.6 05/06/2011    MCV 82.8 05/06/2011    MCHC 33.6 05/06/2011    RDW 13.1 05/06/2011    PLT 196 05/06/2011    PLT 219 05/21/2009    MPV 9.5 05/06/2011    SEG 69 05/06/2011    LYMPHS 23 05/06/2011    MONOS 4 05/06/2011    EOS 3 05/06/2011    BASOS 1 09/04/2009     Lab Results   Component Value Date    BUN 10 06/12/2011    CREAT 0.46 06/12/2011    CL 95 06/12/2011    NA 132 06/12/2011    K 4.4 06/12/2011    [place] 9.3 06/12/2011    TBILI 0.4 05/05/2011    ALB 4.3 05/05/2011    TP 7.8 05/05/2011    AST 18 05/05/2011    ALK 70 05/05/2011    BICARB 24 06/12/2011    ALT 28 05/05/2011    GLU 227 06/12/2011     Lab Results   Component Value Date    A1C 6.6 05/02/2011    A1C 6.6 04/11/2011    A1C 7.4 01/21/2011     Lab Results   Component Value Date    TSH 1.27 05/02/2010     MR enterography  1. Hyperenhancement of the mucosa  in the proximal sigmoid colon  which may be   on the basis of decompression of this segment of bowel versus mucosal   inflammation.  2. Prominent right iliac chain and inguinal lymph nodes. Recommend   correlation with right lower extremity inflammation.    Assessment & Plan:  45 year old female with h/o HTN, DM, HL presents for med refills.     # ischemic colitis - pt still w/ daily abdominal pain w/ good relief from norco and percocet. Per GI, her pain likely is not all from ischemic colitis as this condition is typically painless or minimally painful. GI suggests possible referred pain or myofascial pain syndrome. MR enterography w/ mucosal inflammation and prominent iliac and inguinal lymph nodes.  - referral to pain clinic w/ Dr. Foye Clock (referral was made at last appt as well but pt never followed up)  - referral for pt to f/u w/ GI   - refilled norco (#90, not to be filled until Dec 5 as pt recently had norco filled on 11/20)  - refilled tramadol (#120, pt says she takes 1-2 tabs q8 hr)  - senna, colace    # HTN - blood pressures have been very labile and this is problematic b/c it is essential to avoid hypotension given her ischemic colitis, but when her bp gets too high pt's abdominal pain worsens. 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

## 2011-07-11 NOTE — Progress Notes (Signed)
ATTENDING NOTE:  SUBJECTIVE:   Chief Complaint   Patient presents with   . Results     MRI     I reviewed the history.  Patient interviewed and examined.    History of present illness (HPI):    Katherine Church is a 45 year old female who presents with f/up   Review of Systems (ROS): 10 point review of system negative or non contributory except for issues discussed in HPI as per  the resident's note.  Past Medical, Family, Social History:  As per  the resident's  Note.    Current Outpatient Prescriptions   Medication Sig   . ondansetron (ZOFRAN) 4 MG tablet Take 1 tablet by mouth every 8 hours as needed for Nausea/Vomiting.   Marland Kitchen LORazepam (ATIVAN) 1 MG tablet Take 1 tablet by mouth every 8 hours as needed for Anxiety.   . clonidine (CATAPRES) 0.3 MG tablet Take 1 tablet by mouth 2 times daily.   . traMADol (ULTRAM) 50 MG tablet Take 1 tablet by mouth As Directed. 1 - 2 tabs every 8 hours   . hydrocodone-acetaminophen (NORCO) 7.5-325 MG per tablet Take 1 tablet by mouth every 8 hours as needed for Moderate Pain (Pain Score 4-6). No more then 8 tabs a day   . senna (SENOKOT) 8.6 MG tablet Take 1 tablet by mouth daily.   Marland Kitchen docusate sodium (COLACE) 250 MG capsule Take 1 capsule by mouth 2 times daily.   . metoclopramide (REGLAN) 10 MG tablet Take 1 tablet by mouth 3 times daily.   . carvedilol (COREG) 25 MG tablet Take 0.5 tablets by mouth 2 times daily.   Marland Kitchen lisinopril (PRINIVIL, ZESTRIL) 40 MG tablet Take 1 tablet by mouth 2 times daily.   . polyethylene glycol (GLYCOLAX) powder Take 17 g by mouth daily.   Marland Kitchen omeprazole (PRILOSEC) 20 MG capsule Take 20 mg by mouth daily.   Marland Kitchen atorvastatin (LIPITOR) 20 MG tablet Take 1 tablet by mouth daily.   Marland Kitchen senna (SENOKOT) 8.6 MG tablet Take 1 tablet by mouth daily.   Marland Kitchen DISCONTD: Calcium Carbonate Antacid (TUMS PO)    . DISCONTD: carvedilol (COREG) 25 MG tablet Take 1 tablet by mouth 2 times daily.   Marland Kitchen DISCONTD: clonidine (CATAPRES) 0.3 MG tablet Take 1 tablet by mouth 3 times  daily.   Marland Kitchen DISCONTD: amLODIPINE (NORVASC) 10 MG tablet Take 1 tablet by mouth daily.     Current Facility-Administered Medications   Medication   . lisinopril (PRINIVIL, ZESTRIL) tablet 40 mg     OBJECTIVE:   BP 142/102  Pulse 76  Temp(Src) 98.3 F (36.8 C) (Oral)  Resp 14  Ht 5\' 4"  (1.626 m)  Wt 78.926 kg (174 lb)  BMI 29.87 kg/m2  SpO2 95%  LMP 06/02/2011 Body mass index is 29.87 kg/(m^2).  Gen: AAO x 3, NAD   Psych: Affect positive, mood good  I have examined the patient and I concur with the resident's exam.      OTHER MEDICAL RECORDS/TESTES/IMAGING STUDIES REVIEWED:  I have reviewed the most recent lab and imaging data and with the Pt.    I have review/summarized old Medical Records:      MEDICAL ASSESSMENT and PLAN of CARE:   Ischemi colitis and Pain.  Counseled about use of pain meds and f/up w/ GI  HTN. Some fluctuancy of BP and counseld about compliance especially regarding Clonidine    1. Pain (780.96)  traMADol (ULTRAM) 50 MG tablet, CONSULT/REFERRAL TO INTERNAL  MEDICINE, CONSULT/REFERRAL TO GASTROENTEROLOGY, DISCONTINUED: traMADol (ULTRAM) 50 MG tablet   2. Abdominal pain, right lower quadrant (789.03)  ondansetron (ZOFRAN) 4 MG tablet   3. Anxiety (300.00)  LORazepam (ATIVAN) 1 MG tablet   4. Hypertension (401.9)  clonidine (CATAPRES) 0.3 MG tablet   5. Ischemic colitis, enteritis, or enterocolitis (557.9)  hydrocodone-acetaminophen (NORCO) 7.5-325 MG per tablet   6. Constipation (564.00)  senna (SENOKOT) 8.6 MG tablet, docusate sodium (COLACE) 250 MG capsule      Co-morbid medical problem was taken into account as part of the thought process of making a medical decision regarding the active/acute issue(s).  Assessment and plan reviewed with the resident physician.  I agree with the resident's plan as documented.  See the resident's note for further details.

## 2011-07-18 ENCOUNTER — Telehealth (INDEPENDENT_AMBULATORY_CARE_PROVIDER_SITE_OTHER): Payer: Self-pay | Admitting: Internal Medicine

## 2011-07-18 NOTE — Telephone Encounter (Signed)
Placed call to pt reporting she is in [place] at this time.C/O B/S in 300's,blurred vision and confused.  Pt speaking in full complete sentences. Oriented  X 4.    Pt requesting advise to take an extra Glipizide 5 mg d/t B/S 300's.  Nurse informed pt needs to be seen in [place] ED d/t Medical advise can not be given out of state  and without assessment and evaluation.    B/S  07/17/11 range of 300's( paper not in front of pt to give exact numbers.  07/18/11 B[facility]S 281 at 8 am [facility]    Meals:  Poor eating habits. Per pt usually forces toast down in am for Breakfast. Does not like to eat breakfast.    Nurse STRONGLY encouraged pt to be seen in [place] ED d/t B/S remain 300's x 2 days.  Delay in treatment can cause serious injury.Pt agreed and verbalized understanding of instructions.    Pt returning to [place] January 2013.Nurse informed pt schedule f/u appt when returning to [facility] for evaluation of DM medication  and teaching on Diabetes .     FYI Dr. Emilie Rutter. Mila [place] and Dr.Lunde LOV 07/09/11

## 2011-07-18 NOTE — Telephone Encounter (Signed)
Verbally confirmed name of Primary Care Provider: yes    What is reason for call: pt states her BS has been in th 300's and this morning is 281 and is having blurry vision and feeling disoriented.     Confirmed Contact Number:yes    This message will be transmitted to our triage nurse, you can expect a call by the end of the working day.

## 2011-07-29 ENCOUNTER — Other Ambulatory Visit (INDEPENDENT_AMBULATORY_CARE_PROVIDER_SITE_OTHER): Payer: Self-pay | Admitting: Internal Medicine

## 2011-07-29 MED ORDER — LORAZEPAM 1 MG OR TABS
1.0000 mg | ORAL_TABLET | Freq: Three times a day (TID) | ORAL | Status: DC | PRN
Start: 2011-07-29 — End: 2011-11-03

## 2011-07-29 NOTE — Telephone Encounter (Signed)
Last refilled: 07/09/11 disp.#20x0 refills  Recent ED visits: 05/01/11 dx: acute ischemic colitis  Comment: routing to cross cover Dr. Sherryll Burger for review.  Dr. Anselm Jungling is on vacation.   Lab Results   Component Value Date    NA 132 06/12/2011    K 4.4 06/12/2011    CL 95 06/12/2011    BICARB 24 06/12/2011    BUN 10 06/12/2011    CREAT 0.46 06/12/2011    GLU 227 06/12/2011    [place] 9.3 06/12/2011

## 2011-07-29 NOTE — Telephone Encounter (Signed)
Refilled, closing

## 2011-07-29 NOTE — Telephone Encounter (Signed)
Verbally confirmed name of Primary Care Provider: yes    Last visit Date: 07/09/2011    Medications:  LORAZEPAM 1 MG    Name of Pharmacy Updated in Demographics: yes    Name and phone number of pharmacy if not in Epic Database:   [id number] -- [address] -- [place]

## 2011-08-08 ENCOUNTER — Other Ambulatory Visit (INDEPENDENT_AMBULATORY_CARE_PROVIDER_SITE_OTHER): Payer: Self-pay | Admitting: Internal Medicine

## 2011-08-08 MED ORDER — LISINOPRIL 40 MG OR TABS
40.0000 mg | ORAL_TABLET | Freq: Two times a day (BID) | ORAL | Status: DC
Start: 2011-08-08 — End: 2012-01-14

## 2011-08-08 MED ORDER — TRAMADOL HCL 50 MG OR TABS
50.0000 mg | ORAL_TABLET | ORAL | Status: DC
Start: 2011-08-08 — End: 2011-09-02

## 2011-08-08 NOTE — Telephone Encounter (Signed)
Verbally confirmed name of Primary Care Provider: yes    Last visit Date: 07/09/11     Medications:  1. Medication Requested:  TRAMADOL HCL 50 MG OR TABS    2. Medication Requested: lisinopril (PRINIVIL, ZESTRIL) 40 MG tablet       Name of Pharmacy Updated in Demographics: yes    Name and phone number of pharmacy if not in Epic Database:   [facility] store [phone]  [phone] fax [phone]

## 2011-08-08 NOTE — Telephone Encounter (Signed)
Last refilled:  Tramadol 50mg  07/09/11 disp.#120x0 refills  Lisinopril 40mg   05/12/11 disp.#60x1 refills  Recent ED visits: Hosp. Admission dx: acute ischemic colitis  Comment: routing to Dr. Mila Palmer for review.   Lab Results   Component Value Date    NA 132 06/12/2011    K 4.4 06/12/2011    CL 95 06/12/2011    BICARB 24 06/12/2011    BUN 10 06/12/2011    CREAT 0.46 06/12/2011    GLU 227 06/12/2011    [place] 9.3 06/12/2011

## 2011-08-08 NOTE — Telephone Encounter (Signed)
Refill request approved

## 2011-09-02 ENCOUNTER — Other Ambulatory Visit (INDEPENDENT_AMBULATORY_CARE_PROVIDER_SITE_OTHER): Payer: Self-pay | Admitting: Internal Medicine

## 2011-09-03 MED ORDER — TRAMADOL HCL 50 MG OR TABS
50.0000 mg | ORAL_TABLET | ORAL | Status: DC
Start: 2011-09-02 — End: 2011-10-01

## 2011-09-04 ENCOUNTER — Other Ambulatory Visit (INDEPENDENT_AMBULATORY_CARE_PROVIDER_SITE_OTHER): Payer: Self-pay | Admitting: Internal Medicine

## 2011-09-05 ENCOUNTER — Telehealth (INDEPENDENT_AMBULATORY_CARE_PROVIDER_SITE_OTHER): Payer: Self-pay | Admitting: Internal Medicine

## 2011-09-05 MED ORDER — HYDROCODONE-ACETAMINOPHEN 7.5-325 MG OR TABS
1.0000 | ORAL_TABLET | Freq: Three times a day (TID) | ORAL | Status: DC | PRN
Start: 2011-09-05 — End: 2011-10-06

## 2011-10-01 ENCOUNTER — Other Ambulatory Visit (INDEPENDENT_AMBULATORY_CARE_PROVIDER_SITE_OTHER): Payer: Self-pay | Admitting: Internal Medicine

## 2011-10-03 MED ORDER — TRAMADOL HCL 50 MG OR TABS
50.0000 mg | ORAL_TABLET | ORAL | Status: DC
Start: 2011-10-01 — End: 2011-10-27

## 2011-10-06 ENCOUNTER — Ambulatory Visit (INDEPENDENT_AMBULATORY_CARE_PROVIDER_SITE_OTHER): Payer: PRIVATE HEALTH INSURANCE | Admitting: Internal Medicine

## 2011-10-06 VITALS — BP 140/101 | HR 98 | Temp 98.3°F | Resp 16 | Ht 64.0 in | Wt 176.2 lb

## 2011-10-06 MED ORDER — HYDROCODONE-ACETAMINOPHEN 7.5-325 MG OR TABS
1.0000 | ORAL_TABLET | Freq: Three times a day (TID) | ORAL | Status: DC | PRN
Start: 2011-10-06 — End: 2011-10-27

## 2011-10-06 MED ORDER — AMITRIPTYLINE HCL 25 MG OR TABS
25.0000 mg | ORAL_TABLET | ORAL | Status: DC
Start: 2011-10-06 — End: 2012-10-06

## 2011-10-06 MED ORDER — HYDROCODONE-ACETAMINOPHEN 7.5-325 MG OR TABS
1.0000 | ORAL_TABLET | Freq: Three times a day (TID) | ORAL | Status: DC | PRN
Start: 2011-10-06 — End: 2011-10-06

## 2011-10-10 ENCOUNTER — Other Ambulatory Visit (INDEPENDENT_AMBULATORY_CARE_PROVIDER_SITE_OTHER): Payer: Self-pay

## 2011-10-10 ENCOUNTER — Telehealth (INDEPENDENT_AMBULATORY_CARE_PROVIDER_SITE_OTHER): Payer: Self-pay | Admitting: Internal Medicine

## 2011-10-10 NOTE — Telephone Encounter (Signed)
Pt requesting refill via telephone for glipiZIDE (GLIPIZIDE XL) 5 MG CR tablet   Last visit:10/06/11  Last refill:04/08/11  Lab Results   Component Value Date    A1C 6.6 05/02/2011     Routing to Dr. for review

## 2011-10-14 ENCOUNTER — Telehealth (HOSPITAL_BASED_OUTPATIENT_CLINIC_OR_DEPARTMENT_OTHER): Payer: Self-pay

## 2011-10-14 MED ORDER — GLIPIZIDE 5 MG OR TB24
5.0000 mg | ORAL_TABLET | Freq: Every day | ORAL | Status: DC
Start: 2011-10-10 — End: 2011-11-03

## 2011-10-18 ENCOUNTER — Encounter (INDEPENDENT_AMBULATORY_CARE_PROVIDER_SITE_OTHER): Payer: Self-pay

## 2011-10-22 ENCOUNTER — Ambulatory Visit (HOSPITAL_BASED_OUTPATIENT_CLINIC_OR_DEPARTMENT_OTHER): Payer: PRIVATE HEALTH INSURANCE | Admitting: Gastroenterology

## 2011-10-22 VITALS — BP 146/109 | HR 80 | Temp 98.8°F | Resp 16 | Ht 64.0 in | Wt 172.0 lb

## 2011-10-26 ENCOUNTER — Encounter (HOSPITAL_COMMUNITY): Payer: Self-pay

## 2011-10-26 ENCOUNTER — Other Ambulatory Visit (INDEPENDENT_AMBULATORY_CARE_PROVIDER_SITE_OTHER): Payer: Self-pay | Admitting: Emergency Medicine

## 2011-10-26 ENCOUNTER — Emergency Department
Admit: 2011-10-26 | Discharge: 2011-10-26 | Disposition: A | Discharge status: Home Health | Payer: Self-pay | Attending: Emergency Medicine | Admitting: Emergency Medicine

## 2011-10-26 LAB — CBC WITH DIFF, BLOOD
ANC-Automated: 12.9 10*3/uL — ABNORMAL HIGH (ref 1.6–7.0)
Abs Lymphs: 1.2 10*3/uL (ref 0.8–3.1)
Abs Monos: 0.5 10*3/uL (ref 0.2–0.8)
Hct: 43 % (ref 34.0–45.0)
Hgb: 15 gm/dL (ref 11.2–15.7)
Lymphocytes: 8 % — ABNORMAL LOW (ref 19–53)
MCH: 27.9 pg (ref 26.0–32.0)
MCHC: 34.9 % (ref 32.0–36.0)
MCV: 80.1 um3 (ref 79.0–95.0)
MPV: 10.1 fL (ref 9.4–12.4)
Monocytes: 3 % — ABNORMAL LOW (ref 5–12)
Plt Count: 207 10*3/uL (ref 140–370)
RBC: 5.37 10*6/uL — ABNORMAL HIGH (ref 3.90–5.20)
RDW: 14.6 % — ABNORMAL HIGH (ref 12.0–14.0)
Segs: 88 % — ABNORMAL HIGH (ref 34–71)
WBC: 14.7 10*3/uL — ABNORMAL HIGH (ref 4.0–10.0)

## 2011-10-26 LAB — URINALYSIS
Bilirubin: NEGATIVE
Blood: NEGATIVE
Leuk Esterase: NEGATIVE
Nitrite: NEGATIVE
Protein: NEGATIVE
RBC: <1 (ref 0–?)
Specific Gravity: 1.022 (ref 1.002–1.030)
pH: 5.5 (ref 5.0–8.0)

## 2011-10-26 LAB — COMPREHENSIVE METABOLIC PANEL, BLOOD
ALT (SGPT): 27 U/L (ref 0–33)
AST (SGOT): 19 U/L (ref 0–32)
Albumin: 4.4 g/dL (ref 3.5–5.2)
Alkaline Phos: 130 U/L (ref 35–140)
BUN: 15 mg/dL (ref 6–20)
Bicarbonate: 20 mmol/L — ABNORMAL LOW (ref 22–29)
Bilirubin, Tot: 0.5 mg/dL (ref <1.2)
Calcium: 9.7 mg/dL (ref 8.6–10.5)
Chloride: 97 mmol/L — ABNORMAL LOW (ref 98–107)
Creatinine: 0.5 mg/dL — ABNORMAL LOW (ref 0.51–0.95)
GFR: >60 mL/min
Glucose: 522 mg/dL (ref 70–115)
Potassium: 4.7 mmol/L (ref 3.5–5.1)
Sodium: 132 mmol/L — ABNORMAL LOW (ref 136–145)
Total Protein: 8.4 g/dL — ABNORMAL HIGH (ref 6.0–8.0)

## 2011-10-26 LAB — MAGNESIUM, BLOOD: Magnesium: 2.2 mg/dL (ref 1.7–2.6)

## 2011-10-26 LAB — PROTHROMBIN TIME, BLOOD
INR: 1
PT,Patient: 10.4 s (ref 9.7–12.5)

## 2011-10-26 LAB — LACTATE, BLOOD: Lactate: 16.4 mg/dL (ref 4.5–19.8)

## 2011-10-26 MED ORDER — HYDROMORPHONE HCL 1 MG/ML IJ SOLN
1.00 mg | Freq: Once | INTRAMUSCULAR | Status: AC
Start: 2011-10-26 — End: 2011-10-26
  Administered 2011-10-26: 1 mg via INTRAVENOUS

## 2011-10-26 MED ORDER — ONDANSETRON HCL 4 MG/2ML IV SOLN
4.00 mg | Freq: Once | INTRAMUSCULAR | Status: AC
Start: 2011-10-26 — End: 2011-10-26
  Administered 2011-10-26: 4 mg via INTRAVENOUS
  Filled 2011-10-26: qty 2

## 2011-10-26 MED ORDER — SODIUM CHLORIDE 0.9 % IV BOLUS
1000.00 mL | INJECTION | Freq: Once | INTRAVENOUS | Status: AC
Start: 2011-10-26 — End: 2011-10-26
  Administered 2011-10-26: 1000 mL via INTRAVENOUS

## 2011-10-26 MED ORDER — GLYCOPYRROLATE 0.2 MG/ML IJ SOLN
100.00 ug | Freq: Once | INTRAMUSCULAR | Status: AC
Start: 2011-10-26 — End: 2011-10-26
  Administered 2011-10-26: 100 ug via INTRAVENOUS
  Filled 2011-10-26: qty 1

## 2011-10-26 MED ORDER — DICYCLOMINE HCL 10 MG OR CAPS
10.0000 mg | ORAL_CAPSULE | Freq: Three times a day (TID) | ORAL | Status: DC
Start: 2011-10-26 — End: 2015-01-05

## 2011-10-27 ENCOUNTER — Other Ambulatory Visit (INDEPENDENT_AMBULATORY_CARE_PROVIDER_SITE_OTHER): Payer: Self-pay | Admitting: Internal Medicine

## 2011-10-29 LAB — GLUCOSE POCT, BLOOD
Meter Glucose (POC): 418 mg/dL — ABNORMAL HIGH (ref 70–115)
Meter Glucose (POC): 276 mg/dL — ABNORMAL HIGH (ref 70–115)

## 2011-10-31 MED ORDER — TRAMADOL HCL 50 MG OR TABS
50.0000 mg | ORAL_TABLET | ORAL | Status: DC
Start: 2011-10-27 — End: 2011-12-01

## 2011-10-31 MED ORDER — HYDROCODONE-ACETAMINOPHEN 7.5-325 MG OR TABS
1.0000 | ORAL_TABLET | Freq: Three times a day (TID) | ORAL | Status: DC | PRN
Start: 2011-10-27 — End: 2011-11-27

## 2011-10-31 NOTE — Telephone Encounter (Signed)
Received refill request: 10/27/11  Last Visit: 10/06/11 Dr. Melissa Montane  New Visit: 11/03/11 Dr. Mila [place]  No Shows: none  Last refilled: Norco 10/07/11,  Tramadol 10/01/11  Recent ED visits: 10/26/11 for rectal bleeding    Comment: No response from Dr. Mila Palmer routing to Dr. Lonzo Cloud for review. Thanks  Lab Results   Component Value Date    NA 132 10/26/2011    K 4.7 10/26/2011    CL 97 10/26/2011    BICARB 20 10/26/2011    BUN 15 10/26/2011    CREAT 0.50 10/26/2011    GLU 522 10/26/2011    [place] 9.7 10/26/2011

## 2011-11-03 ENCOUNTER — Ambulatory Visit (INDEPENDENT_AMBULATORY_CARE_PROVIDER_SITE_OTHER): Payer: PRIVATE HEALTH INSURANCE | Admitting: Internal Medicine

## 2011-11-03 VITALS — BP 142/102 | HR 80 | Temp 98.4°F | Resp 14 | Ht 64.0 in | Wt 176.0 lb

## 2011-11-03 DIAGNOSIS — E119 Type 2 diabetes mellitus without complications: Secondary | ICD-10-CM

## 2011-11-03 MED ORDER — ONDANSETRON HCL 4 MG OR TABS
4.0000 mg | ORAL_TABLET | Freq: Three times a day (TID) | ORAL | Status: DC | PRN
Start: 2011-11-03 — End: 2012-10-06

## 2011-11-03 MED ORDER — LORAZEPAM 1 MG OR TABS
1.0000 mg | ORAL_TABLET | Freq: Three times a day (TID) | ORAL | Status: DC | PRN
Start: 2011-11-03 — End: 2011-12-18

## 2011-11-03 MED ORDER — GLIPIZIDE 5 MG OR TABS
5.0000 mg | ORAL_TABLET | Freq: Every evening | ORAL | Status: DC
Start: 2011-11-03 — End: 2012-01-14

## 2011-11-03 MED ORDER — GLIPIZIDE 5 MG OR TABS
5.0000 mg | ORAL_TABLET | Freq: Every morning | ORAL | Status: DC
Start: 2011-11-03 — End: 2012-01-14

## 2011-11-03 MED ORDER — ATORVASTATIN CALCIUM 20 MG OR TABS
20.0000 mg | ORAL_TABLET | Freq: Every day | ORAL | Status: AC
Start: 2011-11-03 — End: ?

## 2011-11-03 MED ORDER — POLYETHYLENE GLYCOL 3350 OR POWD
17.0000 g | Freq: Every day | ORAL | Status: DC
Start: 2011-11-03 — End: 2012-10-10

## 2011-11-03 NOTE — Progress Notes (Signed)
ATTENDING NOTE    Subjective:   I reviewed the history and physical exam with the resident.  Katherine Church is a 46 year old female who presents for follow-up of diabetes. Random glucose in the ED when she was being seen for recurrence of abdominal pain was elevated. 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   Review of Systems (ROS): As per  the resident's note.  Past Medical, Family, Social History:  As per  the resident's  note.    Objective: BP 142/102  Pulse 80  Temp(Src) 98.4 F (36.9 C) (Oral)  Resp 14  Ht 5\' 4"  (1.626 m)  Wt 79.833 kg (176 lb)  BMI 30.2 kg/m2  SpO2 94%  LMP 06/02/2011    I concur with the resident's exam.    Medical Plan of Care:     Assessment and plan reviewed with the resident physician.  I agree with the resident's plan as documented.    See the resident's note for further details.

## 2011-11-04 NOTE — Progress Notes (Signed)
R2 Clinic Note    HPI:  Last visit 10/27/2011    Katherine Church is a 46 year old female w/ h/o DM (a1c 6.6 9/12), HTN, ischemic colitis, HL presents for ED f/u and DM f/u. Pt presented to ED last week w/ abdominal pain, which has been a recurrent problem for her.  Pt still w/ daily abdominal pain and reports that she typically gets good relief from norco and percocet. Per GI, her pain likely is not all from ischemic colitis as this condition is typically painless or minimally painful. GI suggests possible referred pain or myofascial pain syndrome. MRI negative for large vessel dz. Colonoscopy showed focal acute cryptitis. Biopsy w/ no e/o IBD. Infectious w/u  Negative. Abdominal pain better since she was dc'ed from ED. In the ED, her blood sugar was noted to be in the 400s. Pt reports that she checks her sugars twice a week and they are in the 180s in the AM. Says her bps are in the 120s-140s/80s-90s.    PMH:  Past Medical History   Diagnosis Date   . Back pain    . High blood pressure    . Diabetes mellitus    . Hyperlipidemia LDL goal < 100    . Colitis, ulcerative      Meds:  Current Outpatient Prescriptions   Medication Sig   . amitriptyline (ELAVIL) 25 MG tablet Take 1 tablet by mouth As Directed. 1 pill qhs x 2 weeks, then as tolerated, increase to 2 pills qhs   . DISCONTD: amLODIPINE (NORVASC) 10 MG tablet Take 1 tablet by mouth daily.   Marland Kitchen atorvastatin (LIPITOR) 20 MG tablet Take 1 tablet by mouth daily.   Marland Kitchen DISCONTD: Calcium Carbonate Antacid (TUMS PO)    . carvedilol (COREG) 25 MG tablet Take 0.5 tablets by mouth 2 times daily.   Marland Kitchen DISCONTD: carvedilol (COREG) 25 MG tablet Take 1 tablet by mouth 2 times daily.   . clonidine (CATAPRES) 0.3 MG tablet Take 1 tablet by mouth 2 times daily.   Marland Kitchen DISCONTD: clonidine (CATAPRES) 0.3 MG tablet Take 1 tablet by mouth 3 times daily.   Marland Kitchen dicyclomine (BENTYL) 10 MG capsule Take 1 capsule by mouth 3 times daily (before meals).   . docusate sodium (COLACE) 250 MG  capsule Take 1 capsule by mouth 2 times daily.   Marland Kitchen glipiZIDE (GLUCOTROL) 5 MG tablet Take 1 tablet by mouth every morning.   Marland Kitchen glipiZIDE (GLUCOTROL) 5 MG tablet Take 1 tablet by mouth at bedtime.   . hydrocodone-acetaminophen (NORCO) 7.5-325 MG per tablet Take 1 tablet by mouth every 8 hours as needed for Moderate Pain (Pain Score 4-6). No more then 8 tabs a day   . lisinopril (PRINIVIL, ZESTRIL) 40 MG tablet Take 1 tablet by mouth 2 times daily.   Marland Kitchen LORazepam (ATIVAN) 1 MG tablet Take 1 tablet by mouth every 8 hours as needed for Anxiety.   Marland Kitchen omeprazole (PRILOSEC) 20 MG capsule Take 20 mg by mouth daily.   . ondansetron (ZOFRAN) 4 MG tablet Take 1 tablet by mouth every 8 hours as needed for Nausea/Vomiting.   . polyethylene glycol (GLYCOLAX) powder Take 17 g by mouth daily.   Marland Kitchen senna (SENOKOT) 8.6 MG tablet Take 1 tablet by mouth daily.   Marland Kitchen senna (SENOKOT) 8.6 MG tablet Take 1 tablet by mouth daily.   . traMADol (ULTRAM) 50 MG tablet Take 1 tablet by mouth As Directed. 1 - 2 tabs every 8 hours  Current Facility-Administered Medications   Medication   . lisinopril (PRINIVIL, ZESTRIL) tablet 40 mg     Allergies:  Allergies   Allergen Reactions   . Ciprofloxacin Rash   . Flagyl (Metronidazole Hcl) Rash   . Tetanus Toxoids Swelling     Review of Systems:  Additional ROS: 10 system ROS form reviewed with patient   Constitutional:denies fever, chills, night sweats, loss of energy or appetite   EYES: denies visual change, eye pain   ENT: denies oral lesions, sinus congestion or pain   HEART/LUNGS: denies orthopnea, shortness of breath, dyspnea on exertion, cough, chest pain, pedal edema   GI: denies nausea, vomiting, diarrhea, +abdominal pain   GU: denies dysuria, difficulties in urination, incontinence   Neuro: denies new focal neurologic deficits, dizziness, syncope   Musculoskeletal: denies new or change in myalgias or arthralgias   Derm: denies new skin rashes  Psych: denies recent changes in mood, sleep or stress  level    BP 142/102  Pulse 80  Temp(Src) 98.4 F (36.9 C) (Oral)  Resp 14  Ht 5\' 4"  (1.626 m)  Wt 79.833 kg (176 lb)  BMI 30.2 kg/m2  SpO2 94%  LMP 06/02/2011    Physical Exam:  NAD, pleasant  EOMI, PERRL, MMM, OP clear, tympanic membranes clear, nasal turbinates non-erythematous, no sinus tenderness, no LAD  RRR, nl s1, s2, no m/r/g, non-elevated JVP  CTAB, no w/c/r, non-labored breathing  +BS, soft, mild RLQ and RUQ tenderness, ND, no r/g, no fluid wave, no HSM  No c/c/e  CNII-XII intact, 5/5 strength throughout UE and LE, nl sensation to lt touch and vibration throughout, 2+ DTRs throughout, no cerebellar deficits, nl finger to nose, nl heel to shin    Lab Results   Component Value Date    WBC 14.7 10/26/2011    RBC 5.37 10/26/2011    HGB 15.0 10/26/2011    HCT 43.0 10/26/2011    MCV 80.1 10/26/2011    MCHC 34.9 10/26/2011    RDW 14.6 10/26/2011    PLT 207 10/26/2011    PLT 219 05/21/2009    MPV 10.1 10/26/2011    SEG 88 10/26/2011    LYMPHS 8 10/26/2011    MONOS 3 10/26/2011    EOS 3 05/06/2011    BASOS 1 09/04/2009     Lab Results   Component Value Date    BUN 15 10/26/2011    CREAT 0.50 10/26/2011    CL 97 10/26/2011    NA 132 10/26/2011    K 4.7 10/26/2011    [place] 9.7 10/26/2011    TBILI 0.5 10/26/2011    ALB 4.4 10/26/2011    TP 8.4 10/26/2011    AST 19 10/26/2011    ALK 130 10/26/2011    BICARB 20 10/26/2011    ALT 27 10/26/2011    GLU 522 10/26/2011     Lab Results   Component Value Date    A1C 6.6 05/02/2011    A1C 6.6 04/11/2011    A1C 7.4 01/21/2011     Lab Results   Component Value Date    TSH 1.27 05/02/2010     Assessment & Plan:  46 year old female with h/o HTN, DM, HL presents for ED f/u and DM f/u.    # ischemic colitis - pt still w/ daily abdominal pain w/ good relief from norco and percocet. Per GI, her pain likely is not all from ischemic colitis as this condition is typically painless or minimally painful.  GI suggests possible referred pain or myofascial pain syndrome. MRI negative for large vessel dz. Colonoscopy  showed focal acute cryptitis. Biopsy w/ no e/o IBD. Infectious w/u  negative  - f/u w/ rheumatology for evaluation of possible vasculitis  - continue to f/u w/ GI  - continue norco and tramadol   - senna, colace  - continue bentyl    # DM - last a1c 6.6 9/12  - check a1c today  - increase glipizide from 5 daily to 5 qam and 2.5 qpm  - check blood sugars every AM    # anxiety - pt reports having a lot of anxiety lately b/c of uncertainty of diagnosis  - refilled ativan 1 mg (#20)     # HTN - blood pressures have been very labile and this is problematic b/c it is essential to avoid hypotension given her ischemic colitis, but when her bp gets too high pt's abdominal pain worsens. On current regimen of carvedilol, lisinopril, and clonidine, bp have remained in 120-140 range. Today's elevated bp likely from excessive caffeine consumption  - continue carvedilol 12.5 mg BID   - continue lisinopril 40 mg BID   - continue clonidine 0.3 mg BID (supposed to be TID but pt reports taking only BID, encouraged pt not to stop taking this med as it could lead to rebound HTN0   - continue to hold amlodipine 10 mg daily   - encouraged pt to continue bp log   - encourage pt to limit caffeine intake    # HL -  - continue atorvastatin    [address] R2                                                    Follow-up in 2 months    [address] [place]

## 2011-11-06 ENCOUNTER — Other Ambulatory Visit (INDEPENDENT_AMBULATORY_CARE_PROVIDER_SITE_OTHER): Payer: PRIVATE HEALTH INSURANCE

## 2011-11-26 ENCOUNTER — Other Ambulatory Visit (INDEPENDENT_AMBULATORY_CARE_PROVIDER_SITE_OTHER): Payer: Self-pay | Admitting: Internal Medicine

## 2011-11-26 NOTE — Telephone Encounter (Signed)
Prescription Refill Request Received via fax    Last visit Date: 3.25.13  Last Refill request per fax: 3.22.13  Next OV:  NONE     Medications:   hydrocodone-acetaminophen (NORCO) 7.5-325 MG per tablet       Name of Pharmacy in patients demographics? Yes   If not please update patient's pharmacy demographics.

## 2011-11-27 MED ORDER — HYDROCODONE-ACETAMINOPHEN 7.5-325 MG OR TABS
1.0000 | ORAL_TABLET | Freq: Three times a day (TID) | ORAL | Status: DC | PRN
Start: 2011-11-27 — End: 2011-12-24

## 2011-11-27 NOTE — Telephone Encounter (Signed)
Received refill request: 11/27/11  Last refilled: 10/27/11  Last Visit: 11/03/11  New Visit: none   No Shows: none  Recent ED visits: none    Comment: Received refill request, Routing to MD for review.    Lab Results   Component Value Date    NA 132 10/26/2011    K 4.7 10/26/2011    CL 97 10/26/2011    BICARB 20 10/26/2011    BUN 15 10/26/2011    CREAT 0.50 10/26/2011    GLU 522 10/26/2011    [place] 9.7 10/26/2011

## 2011-12-01 ENCOUNTER — Other Ambulatory Visit (INDEPENDENT_AMBULATORY_CARE_PROVIDER_SITE_OTHER): Payer: Self-pay | Admitting: Internal Medicine

## 2011-12-01 NOTE — Telephone Encounter (Signed)
Received refill request: 12/01/11  Last refilled: 10/27/11  Last Visit: 11/03/11  New Visit: none  No Shows: none  Recent ED visits: 10/26/11    Comment: Received refill request, Routing to MD for review.  Lab Results   Component Value Date    NA 132 10/26/2011    K 4.7 10/26/2011    CL 97 10/26/2011    BICARB 20 10/26/2011    BUN 15 10/26/2011    CREAT 0.50 10/26/2011    GLU 522 10/26/2011    [place] 9.7 10/26/2011

## 2011-12-01 NOTE — Telephone Encounter (Signed)
Verbally confirmed name of Primary Care Provider: yes    Last visit Date: 11/03/11    Medications:  1. traMADol (ULTRAM) 50 MG tablet    Name of Pharmacy Updated in Demographics: yes    Name and phone number of pharmacy if not in Epic Database:     [facility][id number] -- [address] -- [place], [place] [address] -- [phone] -- [phone]

## 2011-12-03 MED ORDER — TRAMADOL HCL 50 MG OR TABS
50.0000 mg | ORAL_TABLET | ORAL | Status: DC
Start: 2011-12-01 — End: 2011-12-24

## 2011-12-10 ENCOUNTER — Institutional Professional Consult (permissible substitution) (HOSPITAL_BASED_OUTPATIENT_CLINIC_OR_DEPARTMENT_OTHER): Payer: PRIVATE HEALTH INSURANCE | Admitting: Internal Medicine

## 2011-12-10 ENCOUNTER — Encounter (HOSPITAL_BASED_OUTPATIENT_CLINIC_OR_DEPARTMENT_OTHER): Payer: Self-pay | Admitting: Internal Medicine

## 2011-12-10 VITALS — BP 157/105 | HR 79 | Temp 98.2°F | Ht 64.0 in | Wt 173.3 lb

## 2011-12-10 DIAGNOSIS — I10 Essential (primary) hypertension: Secondary | ICD-10-CM

## 2011-12-10 NOTE — Interdisciplinary (Signed)
Patient, Danyla Sieling left before receiving discharge paperwork following their appt. Carollee Massed Jerrell's plan of care was reviewed by MD during today's visit.  If any changes to plan of care are entered AVS will be updated and the patient notified.    AVS was mailed along with instructions for follow-up with the clinic

## 2011-12-10 NOTE — Interdisciplinary (Signed)
12/10/2011 no med refills

## 2011-12-10 NOTE — Progress Notes (Signed)
Rheumatology Consultation:     Reason: Vasculitis eval     HPI: 20 F with history of poorly controlled HTN, diabetes and recently diagnosis of  colitis is here for evaluation of possible vasculitis/vasculopathy.  She has reports that in 04/2011 she had +BRBPR, severe abdominal pain and was diagnosed with colitis of unclear etiology after colonoscopy.  She was re-hospitalized again 06/2011 with same symptoms of BRBPR and abdominal pain with repeat scope, CT scan +inflammation, but MRI/angiogram negative for vascular changes.  She has been re-scoped since then for f/u with EGD and per patient GI doctors state inflammation is inconclusive. 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 Otherwise from review of records extensive hypercoagulable work up is negative.     Since that time she still has a lot of nausea, cramping abdominal pain, bloated sensation with pain on palpation, but no major bleeding or hematochezia.  She previously to this had a history of severe HTN requiring ICU stay due to retinal bleed from hypertension.  She reports that she has no photosensitivity, no raynauds, no mouth sores, +dry mouth, dry eyes, no dysphagia, no rash or psoriasis, no back pain, +pain in bilateral knees with activity, no uveitis or eye inflammation. She is trying to loose weight but it has remained stable.  She states that medications being prescribed are not helping with cramp pain and she smokes THC to help with nausea and abdominal pain.     Past medical history:   -bleed behind the eye from HTN  -1 miscarriage : in first trimester (5-8 weeks)  -heart murmur 2010  -diabetes mellitus  -lower abdominal pain and bleeding.   -tubal ligation and D+C  -"colitis" 04/2011    Family history:   -father -HTN, father's side with MI and death from MI at young age  -mother's family - lung cancer, mother with COPD hx of smoking and fungal   -second cousin with colonic  inflammation currently being worked up  -maternal grandmother with RA    Social history:   -2 children healthy, partner of 16 years.   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       Current Outpatient Prescriptions   Medication Sig   . amitriptyline (ELAVIL) 25 MG tablet Take 1 tablet by mouth As Directed. 1 pill qhs x 2 weeks, then as tolerated, increase to 2 pills qhs   . DISCONTD: amLODIPINE (NORVASC) 10 MG tablet Take 1 tablet by mouth daily.   Marland Kitchen atorvastatin (LIPITOR) 20 MG tablet Take 1 tablet by mouth daily.   Marland Kitchen DISCONTD: Calcium Carbonate Antacid (TUMS PO)    . carvedilol (COREG) 25 MG tablet Take 0.5 tablets by mouth 2 times daily.   Marland Kitchen DISCONTD: carvedilol (COREG) 25 MG tablet Take 1 tablet by mouth 2 times daily.   . clonidine (CATAPRES) 0.3 MG tablet Take 1 tablet by mouth 2 times daily.   Marland Kitchen DISCONTD: clonidine (CATAPRES) 0.3 MG tablet Take 1 tablet by mouth 3 times daily.   Marland Kitchen dicyclomine (BENTYL) 10 MG capsule Take 1 capsule by mouth 3 times daily (before meals).   . docusate sodium (COLACE) 250 MG capsule Take 1 capsule by mouth 2 times daily.   Marland Kitchen glipiZIDE (GLUCOTROL) 5 MG tablet Take 1 tablet by mouth every morning.   Marland Kitchen glipiZIDE (GLUCOTROL) 5 MG tablet  Take 1 tablet by mouth at bedtime.   . hydrocodone-acetaminophen (NORCO) 7.5-325 MG per tablet Take 1 tablet by mouth every 8 hours as needed for Moderate Pain (Pain Score 4-6). No more then 8 tabs a day   . lisinopril (PRINIVIL, ZESTRIL) 40 MG tablet Take 1 tablet by mouth 2 times daily.   Marland Kitchen LORazepam (ATIVAN) 1 MG tablet Take 1 tablet by mouth every 8 hours as needed for Anxiety.   Marland Kitchen omeprazole (PRILOSEC) 20 MG capsule Take 20 mg by mouth daily.   . ondansetron (ZOFRAN) 4 MG tablet Take 1 tablet by mouth every 8 hours as needed for Nausea/Vomiting.   . polyethylene glycol (GLYCOLAX) powder Take 17 g by mouth daily.   Marland Kitchen senna  (SENOKOT) 8.6 MG tablet Take 1 tablet by mouth daily.   Marland Kitchen senna (SENOKOT) 8.6 MG tablet Take 1 tablet by mouth daily.   . traMADol (ULTRAM) 50 MG tablet Take 1 tablet by mouth As Directed. 1 - 2 tabs every 8 hours     Current Facility-Administered Medications   Medication   . lisinopril (PRINIVIL, ZESTRIL) tablet 40 mg        is allergic to ciprofloxacin; flagyl; and tetanus toxoids.     REVIEW OF SYSTEMS:  General: patient denies fever, chills, fatigue, weight loss.  Skin: patient denies pigmentation and rash, no phosphosensitivity, mouth ulcers, Raynaud +hair thinning,dry mouh   Eyes: patient denies eye pain, dry eyes, uveitis +loss of vision in her left eye  Ears/Nose/Throat: patient denies  epistaxis, frequent URI's and sinus trouble, mouth ulcers, gum bleeding  Respiratory: patient denies cough, sputum, hemoptysis and asthma  Cardiovascular: patient denies irregular heart beat and chest pain, thrombosis, occasional burning pain in her chest intermitent   Gastrointestinal: patient denies poor appetite and dysphagia, +nausea and hematochezia  Genitourinary: patient denies dysuria and hematuria, genital ulcers  Musculoskeletal: patient denies arthritis and muscular weakness  Neurologic: patient denies headaches, weakness, numbness  numbness or tingling of hands and numbness or tingling of feet  Psychiatric: patient denies abusive relationship and excessive alcohol consumption  Hematologic/Lymphatic/Immunologic:patient denies cytopenias fever, night sweats, chills and weight loss  Endocrine:patient denies thyroid disorder, +poorly controlled DM      PHYSICAL EXAM  BP 157/105  Pulse 79  Temp(Src) 98.2 F (36.8 C) (Oral)  Ht 5\' 4"  (1.626 m)  Wt 78.586 kg (173 lb 4 oz)  BMI 29.72 kg/m2    GENERAL: well developed, well-nourished, appearing stated age in no apparent distress  SKIN: no psoriasis, no rash, no palpable purpura, no skin thickening, no telangiectasias,  no livedo, no Raynaud, no bruises. +acne on the  right side of face and abrasion on he right ear  NAILS: no periungual erythema, no digital ischemia, no nail pitting, normal nailbed, no splinter hemorrhages.   HEENT: unremarkable, slight decrease in salivary pooling, no allopecia, no oral ulcers, no eye inflammation  NECK: supple without adenopathy or thyromegaly.  CARDIAC: regular rate an rhythm without murmur.  VASCULAR: dorsalis pedis, posterior tibial, radial and ulnar pulses full and symmetric. No peripheral bruises.  CHEST: breaths sounds clear bilaterally, no rales or wheezes.  ABDOMEN: Bowel sounds active, soft, +slight tenderness to palpation diffuse, no rebound or guarding  EXTREMITIES: no edema, swelling or ulceration, clubbing, cyanosis  NEUROLOGICAL: alert and oriented. Motor 5/5 all four extremities. Sensory: no deficits to light touch. Gait: normal gait. Reflexes normal. Babinski negative  MUSCULOSKELETAL:              Neck: full  range of motion, no cervical spine tenderness              Shoulder: no tenderness of shoulders and bicipital tendons, no swelling, no end-point pain. full range of motion              Elbow: no rheumatic nodules, no tophi              Wrists/hands: no synovitis, no warmth, swelling, no Bouchard's nodules(PIP) or Heberden's nodules (DIP)              Back: full range of motion, no cervical spine tenderness. Sacroiliac maneuvers negative.               Hips: full range of motion              Knees: cold, no effusion, normal range for motion, +crepitus              Ankles/feet:no synovitis, no pain an MT head compression, no pain in heel or achyleal tendon, no dactylitis.      LABS:  Lab Results   Component Value Date    AST 19 10/26/2011    ALT 27 10/26/2011    ALK 130 10/26/2011    TP 8.4 10/26/2011    ALB 4.4 10/26/2011    TBILI 0.5 10/26/2011    DBILI 0.1 05/05/2011     Lab Results   Component Value Date    NA 132 10/26/2011    K 4.7 10/26/2011    CL 97 10/26/2011    BICARB 20 10/26/2011    BUN 15 10/26/2011    CREAT 0.50 10/26/2011     GLU 522 10/26/2011    [place] 9.7 10/26/2011     Lab Results   Component Value Date    WBC 14.7 10/26/2011    RBC 5.37 10/26/2011    HGB 15.0 10/26/2011    HCT 43.0 10/26/2011    MCV 80.1 10/26/2011    MCHC 34.9 10/26/2011    RDW 14.6 10/26/2011    PLT 207 10/26/2011    PLT 219 05/21/2009    MPV 10.1 10/26/2011     Results for BAY, KASZYNSKI (MRN [id number]) as of 12/10/2011 12:26   Ref. Range 04/11/2011 17:07 05/03/2011 11:07   ANA (Anti-Nucelar Ab) Latest Range: Negative  Positive    Anti-Nuclear Ab Titer Latest Range: <1:40 titer 1:40    Anti-Cardiolipin Ab Latest Range: 0-14 GPL  5   Anti-Neutro Cytoplasm Ab Latest Range: Negative  Negative        DRVVT negative (weak positive according to the read, but it corrects with mixing? Factor deficiency vs. Lupus)  Cardiolipin negative  NL protein C/S, anti-thrombin III    MRI and angiogram - negative vessels    Assessment and Plan:     68 F referred to rheumatology to assess for vasculitis/vasculopathy as potential etiology of colitis - presently the report of "weak" positive lupus anti-coagulant likely negative as it corrects with mixing and baseline normal level.   Her history is not suggestive of ANCA associated vasculitis and she has no prior history to height probability of thrombotic vasculopathy other than severe poorly controlled HTN and diabetes.  Finally no evidence of sle by history and PAN less likely in this age group and negative angiogram.  Our suspicion for vasculitis/vasculopathy in her is very low and unlikely the cause of her colitis.  No further testing is necessary presently.  Patient to f/u with PMD and GI, it appears  that there may be plans for another repeat MRI/MRA and we will follow as needed.

## 2011-12-10 NOTE — Patient Instructions (Signed)
What is [place]?    [place] can be set up on your computer or as a Mobile app on your phone (search for "MyChart" in your phone's app store). Earleen Reaper is an online tool you can use to connect with your health care team and your medical records --any time you like. With this secure, private connection you can:    Send non-urgent messages to your clinic    View select test results    Read messages from your physician    View your current list of medications    Request prescription renewals    Receive important health reminders    Request non-urgent appointments    Track vaccinations and other information    Link to related medical websites    How to sign up for [place] :    1.  Go to [email or web address]   2.  Enter the "New Users" box, click "Sign Up Now"  3.  Enter your Activation Code exactly as it appears  [email or web address]  Expires: 03/09/2012  1:33 PM  4.  You will only need to use this activation code during the signup process. This activation code is valid for 90 days. If your activation code has expired, please call Customer Service at [phone] to request a new one.  5.  Enter your social security number and date of birth when prompted, and continue to next page.  6.  Create a username -- this will be your [place] log-in username.  7.  Creat a password (you may change your password at anytime).  8.  Enter your password, reset your security question and provide an answer.  9.  Enter your e-mail address.   10 Click "Sign In"

## 2011-12-10 NOTE — Progress Notes (Signed)
Attending Note:    Subjective:  I reviewed the history.  Patient interviewed and examined.  History of present illness (HPI):  Consultation     Review of Systems (ROS): As per the fellow's note.  Past Medical, Family, Social History:  As per the fellow's  note.    Objective:   I have examined the patient and I concur with the fellow's exam.  Assessment and plan reviewed with the fellow. I agree with the fellow's plan as documented.  See the fellow's note for further details.    Low suspicion of vasculitis. No further specific recommendations for diagnostic intervention

## 2011-12-17 ENCOUNTER — Other Ambulatory Visit (INDEPENDENT_AMBULATORY_CARE_PROVIDER_SITE_OTHER): Payer: Self-pay | Admitting: Internal Medicine

## 2011-12-18 ENCOUNTER — Telehealth (INDEPENDENT_AMBULATORY_CARE_PROVIDER_SITE_OTHER): Payer: Self-pay | Admitting: Internal Medicine

## 2011-12-18 NOTE — Telephone Encounter (Signed)
Received refill request: Ativan 1mg  tabs  Last Visit: 11/03/11 Dr. Mila Palmer  New Visit: none  Last refilled: 11/03/11 disp.#20x0 refills  Recent ED visits: 10/26/11 dx: rectal bleeding.   Comment: routing to Dr. Mila Palmer for review.   Lab Results   Component Value Date    NA 132 10/26/2011    K 4.7 10/26/2011    CL 97 10/26/2011    BICARB 20 10/26/2011    BUN 15 10/26/2011    CREAT 0.50 10/26/2011    GLU 522 10/26/2011    [place] 9.7 10/26/2011

## 2011-12-19 MED ORDER — LORAZEPAM 1 MG OR TABS
1.0000 mg | ORAL_TABLET | Freq: Three times a day (TID) | ORAL | Status: DC | PRN
Start: 2011-12-18 — End: 2011-12-29

## 2011-12-19 NOTE — Telephone Encounter (Signed)
texting to Dr. Mila Palmer for review.

## 2011-12-22 NOTE — Telephone Encounter (Signed)
Called [facility] [phone] to verify approved order for Ativan per Dr. Olga Coaster / Dr. Mila Palmer.  Closing.

## 2011-12-24 ENCOUNTER — Encounter (INDEPENDENT_AMBULATORY_CARE_PROVIDER_SITE_OTHER): Payer: Self-pay | Admitting: Internal Medicine

## 2011-12-24 ENCOUNTER — Ambulatory Visit (INDEPENDENT_AMBULATORY_CARE_PROVIDER_SITE_OTHER): Payer: PRIVATE HEALTH INSURANCE | Admitting: Internal Medicine

## 2011-12-24 VITALS — BP 130/90 | HR 106 | Temp 99.4°F | Resp 14 | Ht 64.0 in | Wt 174.6 lb

## 2011-12-24 MED ORDER — ONDANSETRON HCL 4 MG OR TABS
4.0000 mg | ORAL_TABLET | Freq: Three times a day (TID) | ORAL | Status: DC | PRN
Start: 2011-12-24 — End: 2012-01-12

## 2011-12-24 MED ORDER — GLIPIZIDE 5 MG OR TABS
5.0000 mg | ORAL_TABLET | Freq: Two times a day (BID) | ORAL | Status: DC
Start: 2011-12-24 — End: 2012-01-14

## 2011-12-24 MED ORDER — HYDROCODONE-ACETAMINOPHEN 7.5-325 MG OR TABS
1.0000 | ORAL_TABLET | Freq: Three times a day (TID) | ORAL | Status: DC | PRN
Start: 2011-12-24 — End: 2012-01-14

## 2011-12-24 MED ORDER — TRAMADOL HCL 50 MG OR TABS
50.0000 mg | ORAL_TABLET | ORAL | Status: DC
Start: 2011-12-24 — End: 2012-01-12

## 2011-12-24 MED ORDER — METFORMIN HCL 500 MG OR TB24
500.0000 mg | ORAL_TABLET | Freq: Every day | ORAL | Status: DC
Start: 2011-12-24 — End: 2012-10-06

## 2011-12-24 NOTE — Progress Notes (Signed)
ATTENDING NOTE:  SUBJECTIVE:   Chief Complaint   Patient presents with   . Refill Request     needs refills     I reviewed the history.  Patient interviewed and examined on same day as the resident in the clinic.      History of present illness (HPI):    Katherine Church is a 46 year old female who presents with f/u p  Review of Systems (ROS): 10 point review of system negative or non contributory except for issues discussed in HPI as per  the resident's note.  Past Medical, Family, Social History:  As per  the resident's  Note.    Current Outpatient Prescriptions   Medication Sig   . amitriptyline (ELAVIL) 25 MG tablet Take 1 tablet by mouth As Directed. 1 pill qhs x 2 weeks, then as tolerated, increase to 2 pills qhs   . DISCONTD: amLODIPINE (NORVASC) 10 MG tablet Take 1 tablet by mouth daily.   Marland Kitchen atorvastatin (LIPITOR) 20 MG tablet Take 1 tablet by mouth daily.   Marland Kitchen DISCONTD: Calcium Carbonate Antacid (TUMS PO)    . carvedilol (COREG) 25 MG tablet Take 0.5 tablets by mouth 2 times daily.   Marland Kitchen DISCONTD: carvedilol (COREG) 25 MG tablet Take 1 tablet by mouth 2 times daily.   . clonidine (CATAPRES) 0.3 MG tablet Take 1 tablet by mouth 2 times daily.   Marland Kitchen DISCONTD: clonidine (CATAPRES) 0.3 MG tablet Take 1 tablet by mouth 3 times daily.   Marland Kitchen dicyclomine (BENTYL) 10 MG capsule Take 1 capsule by mouth 3 times daily (before meals).   . docusate sodium (COLACE) 250 MG capsule Take 1 capsule by mouth 2 times daily.   Marland Kitchen glipiZIDE (GLUCOTROL) 5 MG tablet Take 1 tablet by mouth 2 times daily. Take 1 tab in the morning and 1 tab before bedtime.   Marland Kitchen glipiZIDE (GLUCOTROL) 5 MG tablet Take 1 tablet by mouth every morning.   Marland Kitchen glipiZIDE (GLUCOTROL) 5 MG tablet Take 1 tablet by mouth at bedtime.   . hydrocodone-acetaminophen (NORCO) 7.5-325 MG per tablet Take 1 tablet by mouth every 8 hours as needed for Moderate Pain (Pain Score 4-6). No more then 8 tabs a day   . DISCONTD: hydrocodone-acetaminophen (NORCO) 7.5-325 MG per tablet  Take 1 tablet by mouth every 8 hours as needed for Moderate Pain (Pain Score 4-6). No more then 8 tabs a day   . lisinopril (PRINIVIL, ZESTRIL) 40 MG tablet Take 1 tablet by mouth 2 times daily.   Marland Kitchen LORazepam (ATIVAN) 1 MG tablet Take 1 tablet by mouth every 8 hours as needed for Anxiety.   . metFORMIN (GLUCOPHAGE XR) 500 MG XR tablet Take 1 tablet by mouth daily.   Marland Kitchen omeprazole (PRILOSEC) 20 MG capsule Take 20 mg by mouth daily.   . ondansetron (ZOFRAN) 4 MG tablet Take 1 tablet by mouth every 8 hours as needed for Nausea/Vomiting.   . polyethylene glycol (GLYCOLAX) powder Take 17 g by mouth daily.   Marland Kitchen senna (SENOKOT) 8.6 MG tablet Take 1 tablet by mouth daily.   Marland Kitchen senna (SENOKOT) 8.6 MG tablet Take 1 tablet by mouth daily.   . traMADol (ULTRAM) 50 MG tablet Take 1 tablet by mouth As Directed. 1 - 2 tabs every 8 hours   . DISCONTD: traMADol (ULTRAM) 50 MG tablet Take 1 tablet by mouth As Directed. 1 - 2 tabs every 8 hours     Current Facility-Administered Medications   Medication   .  lisinopril (PRINIVIL, ZESTRIL) tablet 40 mg     OBJECTIVE:   BP 130/90  Pulse 106  Temp(Src) 99.4 F (37.4 C) (Oral)  Resp 14  Ht 5\' 4"  (1.626 m)  Wt 79.198 kg (174 lb 9.6 oz)  BMI 29.96 kg/m2  SpO2 96% Body mass index is 29.96 kg/(m^2).  Gen: AAO x 3, NAD  Psych: Affect positive, mood good  I have examined the patient and I concur with the resident's exam.    Lab Results   Component Value Date    A1C 10.8 11/06/2011    A1C 6.6 05/02/2011    A1C 6.6 04/11/2011         OTHER MEDICAL RECORDS/TESTES/IMAGING STUDIES REVIEWED:  I have reviewed the most recent lab and imaging data and with the Pt.    I have review/summarized old Medical Records:      MEDICAL ASSESSMENT and PLAN of CARE:   DM2. Uncontrolled and only monotherapy.  FPG 170's.  Adjust meds and Pt willing to re-challenge on long acting metformin and infoed about SE and f/up in clinic   HTN. borrderline BP and acceptable for now. F/up in clinic.  The current medical regimen  is effective;  continue present plan and medications.  Abd pain.  Pain management     1. Diabetes mellitus type II (250.00)  metFORMIN (GLUCOPHAGE XR) 500 MG XR tablet, CONSULT/REFERRAL DIABETES EDU-PHARMACIST, LIPID(CHOL FRACT) PANEL, BLOOD, GLYCOSYLATED HGB(A1C), BLOOD, DISCONTINUED: glipiZIDE (GLUCOTROL) 5 MG tablet   2. Recurrent abdominal pain (789.00)  DISCONTINUED: traMADol (ULTRAM) 50 MG tablet, DISCONTINUED: hydrocodone-acetaminophen (NORCO) 7.5-325 MG per tablet, DISCONTINUED: ondansetron (ZOFRAN) 4 MG tablet   3. Hypertensive disorder (401.9)        Co-morbid medical problem was taken into account as part of the thought process of making a medical decision regarding the active/acute issue(s).  Assessment and plan reviewed with the resident physician.  I agree with the resident's plan as documented.  See the resident's note for further details.

## 2011-12-25 NOTE — Progress Notes (Signed)
R2 Clinic Note    HPI:  Katherine Church is a 46 year old female w/ h/o DM (a1c 6.6 9/12 -> 10.8 3/13), HTN, ischemic colitis, HL presents for medication refills. Pt still w/ daily abdominal pain and reports that she typically gets good relief from norco and tramadol. Per GI, her pain likely is not from ischemic colitis as this condition is typically painless or minimally painful. GI suggests possible referred pain or myofascial pain syndrome. MRI negative for large vessel dz. Colonoscopy showed focal acute cryptitis. Biopsy w/ no e/o IBD. Infectious w/u Negative. Seen by rheum who said she does not have a vasculitis.    Also, a1c noted to increase from 6.8 to 10.8. Pt reports that her diet was bad for awhile but she is working on eating better. Pt reports that she checks her sugars twice a week and they are in the 170s in the AM. Says her bps are in the 120s-140s/80s-90s. Is trying to incorporate lifestyle changes.    PMH:  Past Medical History   Diagnosis Date   . Back pain    . High blood pressure    . Diabetes mellitus    . Hyperlipidemia LDL goal < 100    . Colitis, ulcerative      Meds:  Current Outpatient Prescriptions    Medication  Sig    .  amitriptyline (ELAVIL) 25 MG tablet  Take 1 tablet by mouth As Directed. 1 pill qhs x 2 weeks, then as tolerated, increase to 2 pills qhs    .  DISCONTD: amLODIPINE (NORVASC) 10 MG tablet  Take 1 tablet by mouth daily.    Marland Kitchen  atorvastatin (LIPITOR) 20 MG tablet  Take 1 tablet by mouth daily.    Marland Kitchen  DISCONTD: Calcium Carbonate Antacid (TUMS PO)     .  carvedilol (COREG) 25 MG tablet  Take 0.5 tablets by mouth 2 times daily.    Marland Kitchen  DISCONTD: carvedilol (COREG) 25 MG tablet  Take 1 tablet by mouth 2 times daily.    .  clonidine (CATAPRES) 0.3 MG tablet  Take 1 tablet by mouth 2 times daily.    Marland Kitchen  DISCONTD: clonidine (CATAPRES) 0.3 MG tablet  Take 1 tablet by mouth 3 times daily.    Marland Kitchen  dicyclomine (BENTYL) 10 MG capsule  Take 1 capsule by mouth 3 times daily (before meals).     .  docusate sodium (COLACE) 250 MG capsule  Take 1 capsule by mouth 2 times daily.    Marland Kitchen  glipiZIDE (GLUCOTROL) 5 MG tablet  Take 1 tablet by mouth every morning.    Marland Kitchen  glipiZIDE (GLUCOTROL) 5 MG tablet  Take 1 tablet by mouth at bedtime.    .  hydrocodone-acetaminophen (NORCO) 7.5-325 MG per tablet  Take 1 tablet by mouth every 8 hours as needed for Moderate Pain (Pain Score 4-6). No more then 8 tabs a day    .  lisinopril (PRINIVIL, ZESTRIL) 40 MG tablet  Take 1 tablet by mouth 2 times daily.    Marland Kitchen  LORazepam (ATIVAN) 1 MG tablet  Take 1 tablet by mouth every 8 hours as needed for Anxiety.    Marland Kitchen  omeprazole (PRILOSEC) 20 MG capsule  Take 20 mg by mouth daily.    .  ondansetron (ZOFRAN) 4 MG tablet  Take 1 tablet by mouth every 8 hours as needed for Nausea/Vomiting.    .  polyethylene glycol (GLYCOLAX) powder  Take 17 g by mouth daily.    Marland Kitchen  senna (SENOKOT) 8.6 MG tablet  Take 1 tablet by mouth daily.    Marland Kitchen  senna (SENOKOT) 8.6 MG tablet  Take 1 tablet by mouth daily.    .  traMADol (ULTRAM) 50 MG tablet  Take 1 tablet by mouth As Directed. 1 - 2 tabs every 8 hours        Allergies:  Allergies   Allergen Reactions   . Ciprofloxacin Rash   . Flagyl (Metronidazole Hcl) Rash   . Tetanus Toxoids Swelling     Review of Systems:  Additional ROS: 10 system ROS form reviewed with patient   Negative except as stated in HPI    BP 130/90  Pulse 106  Temp(Src) 99.4 F (37.4 C) (Oral)  Resp 14  Ht 5\' 4"  (1.626 m)  Wt 79.198 kg (174 lb 9.6 oz)  BMI 29.96 kg/m2  SpO2 96%    Physical Exam:  NAD, pleasant  EOMI, PERRL, MMM, OP clear, tympanic membranes clear, nasal turbinates non-erythematous, no sinus tenderness, no LAD  RRR, nl s1, s2, no m/r/g, non-elevated JVP  CTAB, no w/c/r, non-labored breathing  +BS, soft, mild RLQ and RUQ tenderness, ND, no r/g, no fluid wave, no HSM  No c/c/e  CNII-XII intact, 5/5 strength throughout UE and LE, nl sensation to lt touch and vibration throughout, 2+ DTRs throughout, no cerebellar  deficits, nl finger to nose, nl heel to shin    Lab Results   Component Value Date    WBC 14.7 10/26/2011    RBC 5.37 10/26/2011    HGB 15.0 10/26/2011    HCT 43.0 10/26/2011    MCV 80.1 10/26/2011    MCHC 34.9 10/26/2011    RDW 14.6 10/26/2011    PLT 207 10/26/2011    PLT 219 05/21/2009    MPV 10.1 10/26/2011    SEG 88 10/26/2011    LYMPHS 8 10/26/2011    MONOS 3 10/26/2011    EOS 3 05/06/2011    BASOS 1 09/04/2009     Lab Results   Component Value Date    BUN 15 10/26/2011    CREAT 0.50 10/26/2011    CL 97 10/26/2011    NA 132 10/26/2011    K 4.7 10/26/2011    [place] 9.7 10/26/2011    TBILI 0.5 10/26/2011    ALB 4.4 10/26/2011    TP 8.4 10/26/2011    AST 19 10/26/2011    ALK 130 10/26/2011    BICARB 20 10/26/2011    ALT 27 10/26/2011    GLU 522 10/26/2011     Lab Results   Component Value Date    A1C 10.8 11/06/2011    A1C 6.6 05/02/2011    A1C 6.6 04/11/2011     Lab Results   Component Value Date    TSH 1.27 05/02/2010         Assessment & Plan:  46 year old female with h/o HTN, DM, HL presents for medication refill.    Diabetes mellitus type II - a1c increased significantly over past 6 months (6.6 to 10.8). Am sugars elevated to 170s. Pt reports her diet has been poor but is working on improving it. Will increase glipizide to 5 mg BID and start metformin XR 500 mg. She had previous nausea with metformin so it was dc'ed. However it is essential that we start another medication to achieve better glycemic control so pt is willing to try long-acting version.  - glipiZIDE (GLUCOTROL) 5 MG tablet; Take 1 tablet by mouth  2 times daily. Take 1 tab in the morning and 1 tab before bedtime.  - metFORMIN (GLUCOPHAGE XR) 500 MG XR tablet; Take 1 tablet by mouth daily.  - Consult/Referral Diabetes Edu-Pharm  - Lipid Panel Green Plasma Separator Tube; Future  - Glycosylated Hgb(A1C), Blood Lavender; Future  - diabetic pharmacy consult placed    Recurrent abdominal pain - Per GI, her pain likely is not all from ischemic colitis as this condition is typically  painless or minimally painful. GI suggests possible referred pain or myofascial pain syndrome. MRI negative for large vessel dz. Colonoscopy showed focal acute cryptitis. Biopsy w/ no e/o IBD. Infectious w/u negative. Seen by rheum who says she does not have a vasculitis  - traMADol (ULTRAM) 50 MG tablet; Take 1 tablet by mouth As Directed. 1 - 2 tabs every 8 hours  - hydrocodone-acetaminophen (NORCO) 7.5-325 MG per tablet; Take 1 tablet by mouth every 8 hours as needed for Moderate Pain (Pain Score 4-6). No more then 8 tabs a day  - ondansetron (ZOFRAN) 4 MG tablet; Take 1 tablet by mouth every 8 hours as needed for Nausea/Vomiting.    Hypertensive disorder - blood pressures have been very labile and this is problematic b/c it is essential to avoid hypotension given her ischemic colitis, but when her bp gets too high pt's abdominal pain worsens. On current regimen of carvedilol, lisinopril, and clonidine, bp have remained in 120-140 range.   - continue carvedilol 12.5 mg BID   - continue lisinopril 40 mg BID   - continue clonidine 0.3 mg BID (supposed to be TID but pt reports taking only BID, encouraged pt not to stop taking this med as it could lead to rebound HTN0   - continue to hold amlodipine 10 mg daily   - encouraged pt to continue bp log   - encourage pt to limit caffeine intake                        Follow-up in 3 weeks    [address] [place]

## 2011-12-25 NOTE — Telephone Encounter (Signed)
Discovered in overdue encounter box. Never routed by clinic staff. Patient has been seen since this request. Will close encounter.

## 2011-12-29 ENCOUNTER — Other Ambulatory Visit (INDEPENDENT_AMBULATORY_CARE_PROVIDER_SITE_OTHER): Payer: Self-pay | Admitting: Internal Medicine

## 2011-12-29 NOTE — Telephone Encounter (Signed)
Pt. Requesting refill of Ativan 1mg  tabs. tee'd up 30 tabs ?   Last Visit: 12/24/11 Dr. Mila Palmer  New Visit: none  Last refilled: 12/18/11 disp.#20x0 refills  Comment: routing to Dr. Mila Palmer for review.   Lab Results   Component Value Date    NA 132 10/26/2011    K 4.7 10/26/2011    CL 97 10/26/2011    BICARB 20 10/26/2011    BUN 15 10/26/2011    CREAT 0.50 10/26/2011    GLU 522 10/26/2011    [place] 9.7 10/26/2011

## 2011-12-31 MED ORDER — LORAZEPAM 1 MG OR TABS
1.0000 mg | ORAL_TABLET | Freq: Three times a day (TID) | ORAL | Status: DC | PRN
Start: 2011-12-29 — End: 2012-01-30

## 2011-12-31 NOTE — Telephone Encounter (Signed)
Called [facility] [phone] to verify refill for Ativan 1mg  tabs per Dr. Mila Palmer / Dr. Elenora Gamma.  Closing.

## 2011-12-31 NOTE — Telephone Encounter (Signed)
No response routing to cross cover  Dr. Ma for review.

## 2012-01-01 ENCOUNTER — Telehealth (INDEPENDENT_AMBULATORY_CARE_PROVIDER_SITE_OTHER): Payer: Self-pay | Admitting: Internal Medicine

## 2012-01-01 NOTE — Telephone Encounter (Signed)
Prescription regulation does not recommend consideration of escalation of stronger pain med without evaluation in office.    Pt referred to URGENT eval and establish with Dr. Melissa Montane    "Sign pain syndrome and worsening of pain and Norco/Tramadol not effective.  URGENT eval and establish with Dr. Melissa Montane"    NURSE will f/up and info to Pt

## 2012-01-01 NOTE — Telephone Encounter (Signed)
Reviewed chart. LOV 12/24/11 Dr. Mila Palmer. Discussed recurrent abd pain in office visit 12/24/11.    Placed call to pt C/O Lower abd pain   Emesis x 1 yesterday morning 12/31/11 relieved with Zofran 4 mg.  Onset:Increase in abd pain 12/28/11  Duration:Intermittent  Characteristic:dull pain,cramping in lower abd.   Aggravating Factors:passing flatus or defecation  Relieving Factors:Nothing  Treatment Tried and Failed: Tramadol and Norco with no relief  Denies:Severe abd pain,doubled over with pain,nausea or vomiting at this time,lightheadedness or dizziness  Last BM : this am quarter size pink tinge hard rock     Normal BM every day hard rocks.  Last Meal:yesterday evening brown rice only    B/S 200 this am started Metformin 12/24/11.reports she normally does not eat breakfast. Nurse counsel pt regarding  Diabetic education and Continued B/S 200's or more causes serious complication. Pt agreed.    HX per pt her symptoms ongoing.GI unable to determine the cause. She experiences this abd pain once or twice a month.  This month patient has had 3 episodes of abd pain.     Pt requesting RX pain medication for breakthrough pain d/t Tramadol and Norco not relieving pain.  Pt offered appt and declined d/t seen last week for same symptom.    Nurse informed pt Dr. Mila Palmer not in the office today or next week.  Nurse will forward message to Dr. Marcheta Grammes covering for Dr. Mila Palmer and Dr. Elenora Gamma Attending LOV 12/24/11     STRICT ED precautions for severe abd pain ,doubled over with pain or bright red rectal bleeding.   Pt agreed and verbalized understanding of instructions.      Please Advise Dr.Ma and Dr. Elenora Gamma

## 2012-01-01 NOTE — Telephone Encounter (Signed)
Verbally confirmed name of Primary Care Provider: yes    What is reason for call: Abdominal pain for 1 weeks.     Confirmed Contact Number:yes    This message will be transmitted to our triage nurse, you can expect a call by the end of the working day.

## 2012-01-01 NOTE — Telephone Encounter (Signed)
Research prior to calling patient First available in [facility] Dr.[name] 01/15/12. Checked with Sherilyn Cooter  First available [facility] with  Dr.[name] tomorrow @ 10:15.     Placed call to pt informed of Dr. Anastasia Fiedler response.Offered appt in [facility] on tomorrow @ 10:15. Pt declined d/t Jury duty on tomorrow.  Offered appt 01/15/12 [facility]. Pt declined. Will return call to schedule appt after looking at her calender.    Reports will go to ED if symptoms worsen.    FYI Dr. Elenora Gamma

## 2012-01-07 ENCOUNTER — Telehealth (INDEPENDENT_AMBULATORY_CARE_PROVIDER_SITE_OTHER): Payer: Self-pay | Admitting: Internal Medicine

## 2012-01-07 NOTE — Telephone Encounter (Signed)
From: Larey Days  To: [name], MD  Sent: 01/07/2012 10:17 AM PDT  Subject: Medication Renewal Request    Original authorizing provider: [name], MD    Larey Days would like a refill of the following medications:  traMADol (ULTRAM) 50 MG tablet [name], MD]  ondansetron (ZOFRAN) 4 MG tablet [name], MD]    Preferred pharmacy: [facility] [phone] -- [address] [place], [place] [address] -- [phone] -- [phone]    Comment:

## 2012-01-08 NOTE — Telephone Encounter (Signed)
Routing to Dr. McGee.

## 2012-01-08 NOTE — Telephone Encounter (Signed)
Pt would like to know why she only got 60 Tabs of Tramadol when she usually gets 120 Tabs. Please call pt.

## 2012-01-12 ENCOUNTER — Other Ambulatory Visit (INDEPENDENT_AMBULATORY_CARE_PROVIDER_SITE_OTHER): Payer: Self-pay | Admitting: Internal Medicine

## 2012-01-12 MED ORDER — TRAMADOL HCL 50 MG OR TABS
50.0000 mg | ORAL_TABLET | ORAL | Status: DC
Start: 2012-01-12 — End: 2012-01-13

## 2012-01-12 MED ORDER — ONDANSETRON HCL 4 MG OR TABS
4.0000 mg | ORAL_TABLET | Freq: Three times a day (TID) | ORAL | Status: DC | PRN
Start: 2012-01-12 — End: 2012-01-14

## 2012-01-12 NOTE — Telephone Encounter (Signed)
Called Pt and notified her that Medication has been sent to Pharmacy. Pt stated that 60 tabs is not enough to last her until her appointment with Dr. Melissa Montane. Pt needs more than 60 tabs as she will not be out of town until 02/20/12.

## 2012-01-12 NOTE — Telephone Encounter (Signed)
Received refill request: 01/12/12  Last refilled: 12/24/11  Last Visit: 12/24/11  New Visit: 03/15/12  No Shows: none  Recent ED visits: 10/26/11 Rectal Bleeding    Comment: No response from Dr. Mila Palmer for previous encounter. Pt scheduled appointment to see Dr. Melissa Montane on 03/15/12. Pt needs Tramadol and Zofran. Routing to Dr. Loleta Chance for review. Thanks

## 2012-01-12 NOTE — Telephone Encounter (Signed)
Per previous encounter, Order was written for Tramadol 60 tabs and Pt was to schedule appointment to assess medications for further refills. Pt states she does not have the money to come in and see MD right now and is going out of town on 01/15/12 and will be coming back on July 12th. Pt will schedule appointment today and is requesting enough medication to last until next appointment. Routing to MD for review.

## 2012-01-13 MED ORDER — TRAMADOL HCL 50 MG OR TABS
50.0000 mg | ORAL_TABLET | ORAL | Status: DC
Start: 2012-01-13 — End: 2012-02-11

## 2012-01-13 NOTE — Telephone Encounter (Signed)
Called Pt to notify her of RX order and she stated that she made appointment for tomorow and will see MD. Closing

## 2012-01-13 NOTE — Telephone Encounter (Signed)
Rx sent for 120 pills.

## 2012-01-14 ENCOUNTER — Ambulatory Visit (INDEPENDENT_AMBULATORY_CARE_PROVIDER_SITE_OTHER): Payer: PRIVATE HEALTH INSURANCE | Admitting: Internal Medicine

## 2012-01-14 VITALS — BP 150/110 | HR 96 | Temp 99.1°F | Resp 14 | Ht 64.0 in | Wt 170.0 lb

## 2012-01-14 MED ORDER — HYDROCODONE-ACETAMINOPHEN 7.5-325 MG OR TABS
1.0000 | ORAL_TABLET | Freq: Three times a day (TID) | ORAL | Status: DC | PRN
Start: 2012-01-16 — End: 2012-02-18

## 2012-01-14 MED ORDER — ONDANSETRON HCL 4 MG OR TABS
4.0000 mg | ORAL_TABLET | Freq: Three times a day (TID) | ORAL | Status: DC | PRN
Start: 2012-01-14 — End: 2012-10-06

## 2012-01-14 MED ORDER — GLIPIZIDE 10 MG OR TABS
10.0000 mg | ORAL_TABLET | Freq: Two times a day (BID) | ORAL | Status: DC
Start: 2012-01-14 — End: 2012-10-10

## 2012-01-14 MED ORDER — LISINOPRIL 40 MG OR TABS
40.0000 mg | ORAL_TABLET | Freq: Two times a day (BID) | ORAL | Status: DC
Start: 2012-01-14 — End: 2015-01-05

## 2012-01-14 MED ORDER — CARVEDILOL 25 MG OR TABS
25.0000 mg | ORAL_TABLET | Freq: Two times a day (BID) | ORAL | Status: DC
Start: 2012-01-14 — End: 2012-10-06

## 2012-01-14 MED ORDER — CLONIDINE HCL 0.3 MG OR TABS
0.3000 mg | ORAL_TABLET | Freq: Three times a day (TID) | ORAL | Status: DC
Start: 2012-01-14 — End: 2012-10-06

## 2012-01-14 NOTE — Progress Notes (Signed)
ATTENDING NOTE:    Chief Complaint   Patient presents with   . Abdominal Pain     chronic w/ increased pain    . Rectal Problem     x1        SUBJECTIVE:  I reviewed the history, chart, and resident's note.    History of present illness (HPI):  Katherine Church is a 46 year old female who presents for chronic abd pain.  Seen by GI and pain mgmt for these symptoms.  Requesting refill of norco as well.    DM: poorly controlled especially in last 6 months.  Was having GI side effects from metformin.  210-240s at home, GI symptoms still worsening.    OBJECTIVE: I have seen and examined the patient and I concur with the resident's exam.     MEDICAL PLAN of CARE:   Assessment and plan reviewed with the resident physician, and I agree with the resident's plan as documented.  See the resident's note for further details.    DM: increase glipizide to 10 BID, try taking metformin in evening with food to limit GI side effects.  Repeat labs when she returns next month.    Meds refilled for trip, but note that next refills will not be due until mid-July.

## 2012-01-14 NOTE — Patient Instructions (Signed)
Have fasting labs done when you return from your trip  Schedule visit the following week  Start taking metformin with dinner  Increase glipizide to 10mg  twice a day (two tablets)

## 2012-01-14 NOTE — Progress Notes (Signed)
Chief Complaint   Patient presents with    Abdominal Pain     chronic w/ increased pain     Rectal Problem     x1        SUBJECTIVE::Katherine Church is a 46 year old female with DM, HTN, hx ischemic colitis in 2011, HLD here for above complaint. Last seen on 5/16 at which time hgbA1c was noted to have risen to 10.8% so glipizide was increased to 5mg  BID and metformin was initiated (with cognizance of prior poor GI tolerance). She has a history of chronic abdominal pain for which has been worked up by extensively by GI as well as Rheumatology and which Dr. Foye Clock has helped manage. Per GI the pain was assessed to not be GI in nature and possibly be referred or myofascial in nature, and MRI was negative for large vessel disease. On her last colonoscopy she had focal acute cryptitis and bx with negative for IBD. During Rheumatology visit was assessed to have very low probability for a vasculitic process and no further work-up was recommended. She last had a refill of tramadol #120 prescribed yesterday 6/4, and norco on 5/15 #90. Was also started on elavil by Dr. Foye Clock in 09/2011 but is no longer taking as she did not note a benefit.     Today reports she is going to [place] on Saturday to help take care of her mother who is undergoing a surgery. Reports current flair of abdominal pain. Was taking 3 tabs of both tramadol and norco daily. Yesterday took 6 tabs or norco and 4 tabs of tramadol. The pain meds help make the pain tolerable. Is keeping down foods with the help of zofran. Is having regular bowel movements though is still slightly constipated. Taking senna + colace everyday and miralax as needed. Also thinks that GI symptoms are worse since starting metformin. Is planning to go to the Diabetes Education class when she returns from her trip. She is checking blood sugars twice a day, ranging 210s-240s. Denies hypoglycemic symptoms.      Patient Active Problem List    Diagnosis Date Noted    Tubular  adenoma polyp of rectum 04/26/2011      During colonoscopy (03/2011) a biopsy of rectosigmoid region revealed a tubular adenoma. She was advised to follow up with GI for a repeat colonoscopy in 3-5 years.        Myofascial pain 10/07/2010    Piriformis syndrome 10/07/2010    Screening for breast cancer 03/02/2010    Screening for malignant neoplasm of breast 09/14/2009    Acute ischemic colitis 09/08/2009    Malignant hypertension 05/22/2009    Retinal hemorrhage 05/22/2009    Diabetes 05/18/2009    Hypertensive disorder 05/18/2009    Chronic back pain 05/18/2009    Anxiety 05/18/2009       Current Outpatient Prescriptions   Medication Sig    amitriptyline (ELAVIL) 25 MG tablet Take 1 tablet by mouth As Directed. 1 pill qhs x 2 weeks, then as tolerated, increase to 2 pills qhs    DISCONTD: amLODIPINE (NORVASC) 10 MG tablet Take 1 tablet by mouth daily.    atorvastatin (LIPITOR) 20 MG tablet Take 1 tablet by mouth daily.    DISCONTD: Calcium Carbonate Antacid (TUMS PO)     carvedilol (COREG) 25 MG tablet Take 0.5 tablets by mouth 2 times daily.    DISCONTD: carvedilol (COREG) 25 MG tablet Take 1 tablet by mouth 2 times daily.    clonidine (  CATAPRES) 0.3 MG tablet Take 1 tablet by mouth 2 times daily.    DISCONTD: clonidine (CATAPRES) 0.3 MG tablet Take 1 tablet by mouth 3 times daily.    dicyclomine (BENTYL) 10 MG capsule Take 1 capsule by mouth 3 times daily (before meals).    docusate sodium (COLACE) 250 MG capsule Take 1 capsule by mouth 2 times daily.    glipiZIDE (GLUCOTROL) 5 MG tablet Take 1 tablet by mouth 2 times daily. Take 1 tab in the morning and 1 tab before bedtime.    glipiZIDE (GLUCOTROL) 5 MG tablet Take 1 tablet by mouth every morning.    glipiZIDE (GLUCOTROL) 5 MG tablet Take 1 tablet by mouth at bedtime.    hydrocodone-acetaminophen (NORCO) 7.5-325 MG per tablet Take 1 tablet by mouth every 8 hours as needed for Moderate Pain (Pain Score 4-6). No more then 8 tabs a day     lisinopril (PRINIVIL, ZESTRIL) 40 MG tablet Take 1 tablet by mouth 2 times daily.    LORazepam (ATIVAN) 1 MG tablet Take 1 tablet by mouth every 8 hours as needed for Anxiety.    metFORMIN (GLUCOPHAGE XR) 500 MG XR tablet Take 1 tablet by mouth daily.    omeprazole (PRILOSEC) 20 MG capsule Take 20 mg by mouth daily.    ondansetron (ZOFRAN) 4 MG tablet Take 1 tablet by mouth every 8 hours as needed for Nausea/Vomiting.    ondansetron (ZOFRAN) 4 MG tablet Take 1 tablet by mouth every 8 hours as needed for Nausea/Vomiting.    polyethylene glycol (GLYCOLAX) powder Take 17 g by mouth daily.    senna (SENOKOT) 8.6 MG tablet Take 1 tablet by mouth daily.    senna (SENOKOT) 8.6 MG tablet Take 1 tablet by mouth daily.    traMADol (ULTRAM) 50 MG tablet Take 1 tablet by mouth As Directed. 1 - 2 tabs every 8 hours     Current Facility-Administered Medications   Medication    lisinopril (PRINIVIL, ZESTRIL) tablet 40 mg       Allergies:  Allergies   Allergen Reactions    Ciprofloxacin Rash    Flagyl (Metronidazole Hcl) Rash    Tetanus Toxoids Swelling       History     Social History    Marital Status: Single     Spouse Name: N/A     Number of Children: 2    Years of Education: N/A     Occupational History    unemployed      Social History Main Topics    Smoking status: Never Smoker     Smokeless tobacco: Never Used    Alcohol Use: No    Drug Use: No      remote history of cocaine.  Currently occasional MJ smoking    Sexually Active: Yes -- Female partner(s)      tubal ligation     Other Topics Concern    Blood Transfusions No    Special Diet No    Exercise Yes     tries to walk     Social History Narrative    Two kids-adult in [place] (going thru med school DA); son still deciding.    Born and raised in [place].    Moved to [facility] in august 2010-boyfriend works for [employer].       Family History   Problem Relation Age of Onset    Diabetes Father     Lipids Father     Hypertension Father  Hypertension Mother        ROS:  Gen: No recent changes in weight. No fever/chills.  HEENT: No changes in vision, rhinnitis, or changes in hearing.  Resp: No SOB or cough.  CV: No CP or palpitations. No LE edema.  Abdomen: No constipation, diarrhea or changes in apetite. Denies BRBPR or melena.  Musc: No joint pain  Neuro: No focal numbness or tingling in upper or lower extremities. No changes in strength    OBJECTIVE:: BP 150/110   Pulse 96   Temp(Src) 99.1 F (37.3 C) (Oral)   Resp 14   Ht 5\' 4"  (1.626 m)   Wt 77.111 kg (170 lb)   BMI 29.17 kg/m2   SpO2 98%    General Appearance: healthy, alert, no distress, cooperative.  HEENT: PERRL, EOM's intact. MMM.  Neck:  Supple. No lymphadenopathy.  Heart:  RRR. Normal S1 and S2. No murmurs  Lungs: CTAB  Abdomen: BS normal.  Abdomen soft, non-tender.  No masses or organomegaly.  Extremities:  2+ radial & DP pulses bilaterally. No LE edema.    Recent labs, imaging and chart reviewed.  Lab Results   Component Value Date    NA 132 10/26/2011    K 4.7 10/26/2011    CL 97 10/26/2011    BICARB 20 10/26/2011    BUN 15 10/26/2011    CREAT 0.50 10/26/2011    GLU 522 10/26/2011    [place] 9.7 10/26/2011     Lab Results   Component Value Date    A1C 10.8 11/06/2011     Lab Results   Component Value Date    CHOL 231 04/24/2011    HDL 40 04/24/2011    LDLCALC 115 04/24/2011    TRIG 379 04/24/2011     Lab Results   Component Value Date    WBC 14.7 10/26/2011    RBC 5.37 10/26/2011    HGB 15.0 10/26/2011    HCT 43.0 10/26/2011    MCV 80.1 10/26/2011    MCHC 34.9 10/26/2011    RDW 14.6 10/26/2011    PLT 207 10/26/2011    PLT 219 05/21/2009    MPV 10.1 10/26/2011         ASSESSMENT & PLAN: 46 y/o F with DM, HTN, hx ischemic colitis in 2011, HLD here for medication refills today.    Recurrent abdominal pain: extensive GI and Rheumatology work-up as above, and f/b Dr. Foye Clock for pain management. Today will refill Norco #90 to be picked up early on 6/7 but next refill will not be due till 7/11 (tramadol refill  provided already)  - hydrocodone-acetaminophen (NORCO) 7.5-325 MG per tablet; Take 1 tablet by mouth every 8 hours as needed for Moderate Pain (Pain Score 4-6). No more then 8 tabs a day  - ondansetron (ZOFRAN) 4 MG tablet; Take 1 tablet by mouth every 8 hours as needed for Nausea/Vomiting.  - Advised to restart elavil, will consider increasing dose if tolerated  - F/u with Dr. Foye Clock as scheduled in August    Diabetes mellitus type II: poorly controlled despite recent medication changes with BS ranging in 200s. Advised patient to take metformin with dinner to alleviate adverse effects and will increase glipizide to 10mg  BID. Plans to attend Diabetes Education when she returns from her trip. Will consider adding third agent at that time, but advised that she may need insulin in the future. Will repeat hgbA1c prior to next visit and review BS log at next visit.  - Cont  metformin 500mg  XR with dinner  - glipiZIDE (GLUCOTROL) 10 MG tablet; Take 1 tablet by mouth 2 times daily. Take 1 tab in the morning and 1 tab before bedtime.  - Glycosylated Hgb(A1C), Blood Lavender; Future    Hypertension: elevated today in the setting of pain. Advised to keep BP log, will review and re-assess at next visit in 6 weeks.  - carvedilol (COREG) 25 MG tablet; Take 1 tablet by mouth 2 times daily.  - clonidine (CATAPRES) 0.3 MG tablet; Take 1 tablet by mouth 3 times daily.  - lisinopril (PRINIVIL, ZESTRIL) 40 MG tablet; Take 1 tablet by mouth 2 times daily.    Constipation  - Advised daily senna + colace, and miralax as needed given narcotic use    Hyperlipidemia  - Lipid Panel Green Plasma Separator Tube; Future    Follow-up in 6 week(s).    D/w attending Dr. Loleta Chance    Barriers to Learning assessed: none. Patient verbalizes understanding of teaching and instructions and is agreeable to above plan.

## 2012-01-26 ENCOUNTER — Other Ambulatory Visit (INDEPENDENT_AMBULATORY_CARE_PROVIDER_SITE_OTHER): Payer: Self-pay | Admitting: Internal Medicine

## 2012-01-28 ENCOUNTER — Encounter (INDEPENDENT_AMBULATORY_CARE_PROVIDER_SITE_OTHER)

## 2012-01-30 NOTE — Telephone Encounter (Signed)
Received refill request: Ativan 1mg  tabs  Last Visit: 01/14/12 Dr. Ronalee Belts  New Visit: 03/15/12 Dr. Melissa Montane  Last refilled: 12/29/11 disp.#30x0 refills  Comment: routing to Dr. Mila Palmer for review.   Lab Results   Component Value Date    NA 132 10/26/2011    K 4.7 10/26/2011    CL 97 10/26/2011    BICARB 20 10/26/2011    BUN 15 10/26/2011    CREAT 0.50 10/26/2011    GLU 522 10/26/2011    [place] 9.7 10/26/2011

## 2012-01-30 NOTE — Telephone Encounter (Signed)
Pt is requesting status on her refill below sent through MyChart. Pt is out of medication.

## 2012-02-02 MED ORDER — LORAZEPAM 1 MG OR TABS
1.0000 mg | ORAL_TABLET | Freq: Three times a day (TID) | ORAL | Status: DC | PRN
Start: 2012-01-30 — End: 2012-03-15

## 2012-02-02 NOTE — Telephone Encounter (Signed)
Pt is wondering why this is taking so long to be approved. Please call pt when ready

## 2012-02-02 NOTE — Telephone Encounter (Signed)
No response routing to cross cover Dr. Shin for review.

## 2012-02-02 NOTE — Telephone Encounter (Signed)
Called [facility] [phone] and advised approved refill for Ativan 1mg  per Dr. Mila Palmer / Dr. Elenora Gamma. Called pt. And advised of refill. She stated she is still in [place] and will call [facility] for transfer to her pharmacy there.  Closing.

## 2012-02-09 ENCOUNTER — Other Ambulatory Visit (INDEPENDENT_AMBULATORY_CARE_PROVIDER_SITE_OTHER): Payer: Self-pay | Admitting: Internal Medicine

## 2012-02-11 ENCOUNTER — Other Ambulatory Visit (INDEPENDENT_AMBULATORY_CARE_PROVIDER_SITE_OTHER): Payer: Self-pay | Admitting: Internal Medicine

## 2012-02-11 NOTE — Telephone Encounter (Signed)
Prescription refill request: tramadol (ULTRAM) 50 MG tablet  Diagnosis: Recurrent abdominal pain   Last Office Visit: 01/14/12 with Dr. Birder Robson  Next Office Visit: 03/15/12 with Dr. Melissa Montane  No Shows: 0  Last Refilled: 01/13/12  Recent ED Visit: None  Comment: Refill request for tramadol (ULTRAM) 50 MG tablet. Medication pending MD approval. Routed to Dr. Loleta Chance.

## 2012-02-11 NOTE — Telephone Encounter (Signed)
Pt requesting medication below. Pt will be out on Saturday.

## 2012-02-11 NOTE — Telephone Encounter (Signed)
Verbally confirmed name of Primary Care Provider: yes    Last visit Date: 01/14/2012    Medications:  traMADol (ULTRAM) 50 MG   hydrocodone-acetaminophen (NORCO) 7.5-325 MG   Pt requesting medication before holiday . Pt place Mychart requesting on Monday, July 1,2013.  Name of Pharmacy Updated in Demographics: yes    Name and phone number of pharmacy if not in Epic Database:   [facility] #[id number] -- [address] -- [place], [place] [address] -- [phone] -- [phone]

## 2012-02-11 NOTE — Telephone Encounter (Signed)
Received refill request: 02/11/12  Last refilled: see MAR  Last Visit: 01/14/12  New Visit: 03/15/12  No Shows: none  Recent ED visits: 10/26/11 Rectal Bleeding    Comment: Received refill request, Routing to MD for review.  Lab Results   Component Value Date    NA 132 10/26/2011    K 4.7 10/26/2011    CL 97 10/26/2011    BICARB 20 10/26/2011    BUN 15 10/26/2011    CREAT 0.50 10/26/2011    GLU 522 10/26/2011    [place] 9.7 10/26/2011

## 2012-02-11 NOTE — Telephone Encounter (Signed)
From: Larey Days  To: Dahlia Client, MD  Sent: 02/09/2012 8:02 AM PDT  Subject: Medication Renewal Request    Original authorizing provider: Dahlia Client, MD    Larey Days would like a refill of the following medications:  traMADol (ULTRAM) 50 MG tablet Dahlia Client, MD]    Preferred pharmacy: [facility] #[id number] -- [address] [place], [place] [address] -- [phone] -- [phone]    Comment:      Medication renewals requested in this message routed to other providers:  hydrocodone-acetaminophen (NORCO) 7.5-325 MG per tablet Norm Salt, MD]

## 2012-02-13 MED ORDER — TRAMADOL HCL 50 MG OR TABS
50.0000 mg | ORAL_TABLET | ORAL | Status: DC
Start: 2012-02-11 — End: 2012-03-15

## 2012-02-13 NOTE — Telephone Encounter (Signed)
Tramadol already refilled recently. Per last note, not due for norco refill until 7/11

## 2012-02-13 NOTE — Telephone Encounter (Signed)
Verbally confirmed name of Primary Care Provider: yes    Last visit Date: 01/14/2012    Medications:    hydrocodone-acetaminophen (NORCO) 7.5-325 MG     Name of Pharmacy Updated in Demographics: yes    Name and phone number of pharmacy if not in Epic Database:   [facility] #[id number] -- [address] -- [place], [place] [address] -- [phone] -- [phone]

## 2012-02-13 NOTE — Telephone Encounter (Signed)
No response, Routing to cross cover, Dr. Evette Cristal.

## 2012-02-18 ENCOUNTER — Telehealth (INDEPENDENT_AMBULATORY_CARE_PROVIDER_SITE_OTHER): Payer: Self-pay | Admitting: Internal Medicine

## 2012-02-18 MED ORDER — HYDROCODONE-ACETAMINOPHEN 7.5-325 MG OR TABS
1.0000 | ORAL_TABLET | Freq: Three times a day (TID) | ORAL | Status: DC | PRN
Start: 2012-02-18 — End: 2012-03-15

## 2012-02-18 NOTE — Telephone Encounter (Signed)
Please call pharmacy to verify medication [facility]

## 2012-02-18 NOTE — Telephone Encounter (Signed)
Called in to Pharmacy. Closing

## 2012-02-18 NOTE — Telephone Encounter (Signed)
Please sign order for Norco. Thanks

## 2012-03-11 ENCOUNTER — Other Ambulatory Visit (INDEPENDENT_AMBULATORY_CARE_PROVIDER_SITE_OTHER): Payer: Self-pay | Admitting: Internal Medicine

## 2012-03-11 NOTE — Telephone Encounter (Signed)
From: Katherine Church  To: [name], MD  Sent: 03/11/2012 12:52 PM PDT  Subject: Medication Renewal Request    Original authorizing provider: [name], MD    Katherine Church would like a refill of the following medications:  LORazepam (ATIVAN) 1 MG tablet [name], MD]    Preferred pharmacy: [facility] #[id number] -- [address] [place], [place] [address] -- [phone] -- [phone]    Comment:      Medication renewals requested in this message routed to other providers:  hydrocodone-acetaminophen (NORCO) 7.5-325 MG per tablet Darol Destine, MD]

## 2012-03-11 NOTE — Telephone Encounter (Signed)
Pt sent my chart mess for refills of Lorazepam 1mg  # 30  Last filled 01/30/12  Last visit 01/14/12  Next visit with Dr.[name] 03/15/12  Also requested Norco 7.5- 325 # 90  Last filled 02/19/12  Rx pended and routed to MD

## 2012-03-15 ENCOUNTER — Ambulatory Visit (INDEPENDENT_AMBULATORY_CARE_PROVIDER_SITE_OTHER): Payer: PRIVATE HEALTH INSURANCE | Admitting: Internal Medicine

## 2012-03-15 VITALS — BP 140/100 | HR 80 | Temp 99.7°F | Resp 12 | Ht 64.0 in | Wt 169.0 lb

## 2012-03-15 MED ORDER — HYDROCODONE-ACETAMINOPHEN 7.5-325 MG OR TABS
1.0000 | ORAL_TABLET | Freq: Three times a day (TID) | ORAL | Status: DC | PRN
Start: 2012-03-15 — End: 2012-04-14

## 2012-03-15 NOTE — Patient Instructions (Signed)
Follow up with Dr. Brunetta Genera in gastroenterology regarding the ongoing rectal bleeding    Resume taking amitryptiline 1 pill at bedtime. Stay on this until you follow up with me    Limit your use of Norco to no more than 3x/day maximum    Look up [employer] [facility] [employer] website for helpful resources

## 2012-03-15 NOTE — Telephone Encounter (Signed)
Tee'd up Rx.

## 2012-03-15 NOTE — Progress Notes (Signed)
Patient of Dr. Christie Nottingham here for follow up of chronic back pain. I  Last saw patient over 6 mos ago. she returns today to followup chronic abdominal pain. My assessment and plan at that visit:  10/06/11:  "Recurrent abdominal pain (789.00) unclear etiology - ?recurrent ischemic colitis, per prior GI evaluation. ?vasulitis - never saw Rheum. ?neurologic etiology such as abdominal migraines? I will repeat some labs - ESR, CRP, - I advised her to f/u with GI, Dr. Brunetta Genera. Will discuss case with one of our neurologist/HA specialists. Patient is agreeable to an empiric trial of amitriptyline to see if symptoms respond. I will start her on 25 mg q.h.s. as tolerated increase to 50 q.h.s. I will refill her Norco #90 for a month. Advised her to minimize her use of this and not use it on a chronic daily basis. I advised her to reserve it for just the days that she has the severe pain.    Chronic back pain (724.5)    F/u in 4 weeks"     In the interim, no source of her pain has been identified. She continues to report constant daily remitting abdominal pain. She describes pain in both her upper and lower abdomen the worst area pain in the right lower quadrant. This does seem to be related to some concurrent constipation as her pain does worsen when she can have a bowel movement. Otherwise the pattern of increased pain is usually unpredictable. She also reports that she intermittently still continues to notice some rectal bleeding off and on  She stopped taking the Elavil - she continues to take Norco prn, 2-5/day and tramadol 50 mg 2-3/day. .     She has undergone a fairly extensive workup for this through GI with no definitive etiology identified. Her last appointment with gastroenterology was on May 28, 2011 with Dr. Brunetta Genera which was followup of previous hospitalization for this. She had been diagnosed tentatively with ischemic colitis. Dr. Leda Min assessment at that visit: "Initial episodes were nsaid colopathy  related too. Now off nsaids since last mo and still had mucosal changes, more s/o ischemic injury. DDx include vasculitis, hypercoagulable state, other vascular disease with embolic events etc."   Her plan: "abstain from NSAIDs - pt has been off of these since then.   - cont Benefiber/Citrucel, Miralax BID or as needed. Can use Milk of Magnesia prn   - Rheumatology consultation to evaluate for possibility of vasculitis, and for weak + lupus anticoagulant. - Pt has not seen Rheumatology yet.  - Obtain MR-angio to examine the abdominal vasculature for obstructive disease. This was negative  -There is no specific GI disease to initiate the symptoms but this is more of an end result with decreased blood supply (from a yet unidentified cause) to the colon. I dont think narcotic pain meds use is necessary for "ischemic colitis" diagnosis. She may have some other reason as the cause of this significant pain, like referred pain or myofascial pain syndrome related."     Current Pain Medication regimen:   Takes Norco 7.5/325 chronically - tends to take 2-3/day. States that between severe episodes of pain, has some persistent dull pain.       ROS:  General; no weight loss  GI: tends to have constipation. No diarrhea or blood in stool  Psych: Denies any mood changes    Past Medical History   Diagnosis Date   . Back pain    . High blood pressure    . Diabetes mellitus    .  Hyperlipidemia LDL goal < 100    . Colitis, ulcerative        Current Outpatient Prescriptions on File Prior to Visit   Medication Sig Dispense Refill   . amitriptyline (ELAVIL) 25 MG tablet Take 1 tablet by mouth As Directed. 1 pill qhs x 2 weeks, then as tolerated, increase to 2 pills qhs  60 tablet  0   . DISCONTD: amLODIPINE (NORVASC) 10 MG tablet Take 1 tablet by mouth daily.  90 tablet  3   . atorvastatin (LIPITOR) 20 MG tablet Take 1 tablet by mouth daily.  90 tablet  1   . DISCONTD: Calcium Carbonate Antacid (TUMS PO)        . carvedilol (COREG) 25 MG  tablet Take 1 tablet by mouth 2 times daily.  180 tablet  3   . DISCONTD: carvedilol (COREG) 25 MG tablet Take 1 tablet by mouth 2 times daily.  60 tablet  2   . clonidine (CATAPRES) 0.3 MG tablet Take 1 tablet by mouth 3 times daily.  180 tablet  3   . DISCONTD: clonidine (CATAPRES) 0.3 MG tablet Take 1 tablet by mouth 3 times daily.  180 tablet  3   . dicyclomine (BENTYL) 10 MG capsule Take 1 capsule by mouth 3 times daily (before meals).  60 capsule  0   . docusate sodium (COLACE) 250 MG capsule Take 1 capsule by mouth 2 times daily.  60 capsule  0   . glipiZIDE (GLUCOTROL) 10 MG tablet Take 1 tablet by mouth 2 times daily. Take 1 tab in the morning and 1 tab before bedtime.  180 tablet  1   . hydrocodone-acetaminophen (NORCO) 7.5-325 MG per tablet Take 1 tablet by mouth every 8 hours as needed for Moderate Pain (Pain Score 4-6). No more then 3 tabs a day. Do not fill until 02/19/12. Next refill due on 03/19/12.  90 tablet  0   . lisinopril (PRINIVIL, ZESTRIL) 40 MG tablet Take 1 tablet by mouth 2 times daily.  180 tablet  3   . LORazepam (ATIVAN) 1 MG tablet Take 1 tablet by mouth every 8 hours as needed for Anxiety.  30 tablet  0   . metFORMIN (GLUCOPHAGE XR) 500 MG XR tablet Take 1 tablet by mouth daily.  30 tablet  3   . omeprazole (PRILOSEC) 20 MG capsule Take 20 mg by mouth daily.       . ondansetron (ZOFRAN) 4 MG tablet Take 1 tablet by mouth every 8 hours as needed for Nausea/Vomiting.  30 tablet  0   . ondansetron (ZOFRAN) 4 MG tablet Take 1 tablet by mouth every 8 hours as needed for Nausea/Vomiting.  15 tablet  0   . polyethylene glycol (GLYCOLAX) powder Take 17 g by mouth daily.  255 g  0   . senna (SENOKOT) 8.6 MG tablet Take 1 tablet by mouth daily.  30 tablet  0   . senna (SENOKOT) 8.6 MG tablet Take 1 tablet by mouth daily.  30 tablet  1   . traMADol (ULTRAM) 50 MG tablet Take 1 tablet by mouth As Directed. 1 - 2 tabs every 8 hours  120 tablet  0     Current Facility-Administered Medications on File  Prior to Visit   Medication Dose Route Frequency Provider Last Rate Last Dose   . lisinopril (PRINIVIL, ZESTRIL) tablet 40 mg  40 mg Oral Daily Andrey Cota, MD  Physical Exam: General Appearance: healthy, alert, no distress, pleasant affect, cooperative.  BP 140/100  Pulse 80  Temp(Src) 99.7 F (37.6 C) (Oral)  Resp 12  Ht 5\' 4"  (1.626 m)  Wt 76.658 kg (169 lb)  BMI 28.99 kg/m2  Abdomen: Abdomen soft, non-distended.Mild-moderate tenderness to palpation over RUQ/epigastrium. No guarding or rebound.  No masses or organomegaly. Bowel sounds normal.    Assessment and plan:  Chronic abdominal pain with unclear explanation.I have advised her that I don't think managing with opiates he is the best course of action for her. I recommended that she see how she does with trying to taper back her opiates. I also advised her that I would use just tramadol or Norco but not both. She feels the Norco is more effective than the tramadol so I will discontinue the tramadol and have her continue Norco p.r.n.but limit her dosing to a maximum of 3 times per day for now.  She only took the amitriptyline for a week when I gave it to her before. I've recommended we trying this medication, staying on it longer and potentially titrating up the dose further to see if it helps. She is agreeable to this. She'll resume 25 mg at bedtime for now.since she continues to report some intermittent rectal bleeding, I recommended that she does followup with Dr.Copur-Dahi, in GI.   She for her underlying constipation she will continue with her  daily MiraLax which seems to be working   I have asked her to followup with me in about 3 weeks

## 2012-03-15 NOTE — Telephone Encounter (Signed)
From: Katherine Church  To: Darol Destine, MD  Sent: 03/11/2012 12:52 PM PDT  Subject: Medication Renewal Request    Original authorizing provider: Darol Destine, MD    Katherine Church would like a refill of the following medications:  hydrocodone-acetaminophen (NORCO) 7.5-325 MG per tablet Darol Destine, MD]    Preferred pharmacy: [facility] #[id number] -- [address] [place], [place] [address] -- [phone] -- [phone]    Comment:      Medication renewals requested in this message routed to other providers:  LORazepam (ATIVAN) 1 MG tablet [Ottar Faythe Dingwall, MD]

## 2012-03-16 MED ORDER — LORAZEPAM 1 MG OR TABS
1.0000 mg | ORAL_TABLET | Freq: Three times a day (TID) | ORAL | Status: DC | PRN
Start: 2012-03-15 — End: 2012-04-20

## 2012-03-16 NOTE — Telephone Encounter (Signed)
Routing to Dr. Alonzo.

## 2012-03-16 NOTE — Telephone Encounter (Signed)
Pt is requesting status on refill for Ativan. Pt requested on 8/1.

## 2012-03-30 ENCOUNTER — Other Ambulatory Visit (INDEPENDENT_AMBULATORY_CARE_PROVIDER_SITE_OTHER): Admitting: Pharmacotherapy

## 2012-03-30 NOTE — Progress Notes (Addendum)
Diabetes [facility]Management [facility]: Group Class Session I    Referring Provider: Andrey Cota    Referral Reason: Diabetes Education      HPI: Katherine Church is a 46 year old, female with a history of Type 2 Diabetes Mellitus who is being seen for education on Healthy Eating, DM overview, medication review, monitoring, and understanding nutrition labels.    Date of last diabetic foot exam: 12/02/10   Date of last diabetic eye exam: 10/24/10    Subjective Findings:  Patient Goal:  Healthy eating and understanding nutrition labels. Patient Goal Achieved?  no - in progress.  SMBGs: Checks BGs once a day.  Pt has attended a DSMT class in 2007 and learned about portion control.   Nutrition: Eats 2 meals per day. Breakfast (7AM: none), Lunch (11:30AM): crackers, Dinner (4:30pm): chicken, potatoes, veggies, rolls, Snacks: 'anything sweet', Beverage: not reported. Last saw a dietician on 09/2009  Physical Activity: Walks 3 miles per day for 2 hours, 5 times per week  DM Meds: metformin XR 500mg  daily , glipizide 10mg  bid  Healthy Coping: Initial Visit Survey does not indicate any Sx of depression, but mood has interfered with taking care of her diabetes.  Reducing Risks: Denies numbness in feet but has vision problems since 2011  Diabetes self-management confidence: Patient reported being least confident in planning meals, using glucometer about once daily, understanding how medications work, being able to recognize symptoms of hypoglycemia, understanding risks of uncontrolled high blood sugar over long term, knowing how to reduce risk of getting diabetes complications.  Diabetes Pre-knowledge Survey Score: 9/13    MEDICATIONS:  Current Outpatient Prescriptions   Medication Sig   . amitriptyline (ELAVIL) 25 MG tablet Take 1 tablet by mouth As Directed. 1 pill qhs x 2 weeks, then as tolerated, increase to 2 pills qhs   . DISCONTD: amLODIPINE (NORVASC) 10 MG tablet Take 1 tablet by mouth daily.   Marland Kitchen atorvastatin (LIPITOR)  20 MG tablet Take 1 tablet by mouth daily.   Marland Kitchen DISCONTD: Calcium Carbonate Antacid (TUMS PO)    . carvedilol (COREG) 25 MG tablet Take 1 tablet by mouth 2 times daily.   Marland Kitchen DISCONTD: carvedilol (COREG) 25 MG tablet Take 1 tablet by mouth 2 times daily.   . clonidine (CATAPRES) 0.3 MG tablet Take 1 tablet by mouth 3 times daily.   Marland Kitchen DISCONTD: clonidine (CATAPRES) 0.3 MG tablet Take 1 tablet by mouth 3 times daily.   Marland Kitchen dicyclomine (BENTYL) 10 MG capsule Take 1 capsule by mouth 3 times daily (before meals).   . docusate sodium (COLACE) 250 MG capsule Take 1 capsule by mouth 2 times daily.   Marland Kitchen glipiZIDE (GLUCOTROL) 10 MG tablet Take 1 tablet by mouth 2 times daily. Take 1 tab in the morning and 1 tab before bedtime.   . hydrocodone-acetaminophen (NORCO) 7.5-325 MG per tablet Take 1 tablet by mouth every 8 hours as needed for Moderate Pain (Pain Score 4-6). No more then 3 tabs a day. Do not fill until 02/19/12. Next refill due on 03/19/12.   Marland Kitchen lisinopril (PRINIVIL, ZESTRIL) 40 MG tablet Take 1 tablet by mouth 2 times daily.   Marland Kitchen LORazepam (ATIVAN) 1 MG tablet Take 1 tablet by mouth every 8 hours as needed for Anxiety.   . metFORMIN (GLUCOPHAGE XR) 500 MG XR tablet Take 1 tablet by mouth daily.   Marland Kitchen omeprazole (PRILOSEC) 20 MG capsule Take 20 mg by mouth daily.   . ondansetron (ZOFRAN) 4 MG tablet Take  1 tablet by mouth every 8 hours as needed for Nausea/Vomiting.   . ondansetron (ZOFRAN) 4 MG tablet Take 1 tablet by mouth every 8 hours as needed for Nausea/Vomiting.   . polyethylene glycol (GLYCOLAX) powder Take 17 g by mouth daily.   Marland Kitchen senna (SENOKOT) 8.6 MG tablet Take 1 tablet by mouth daily.   Marland Kitchen senna (SENOKOT) 8.6 MG tablet Take 1 tablet by mouth daily.     Current Facility-Administered Medications   Medication   . lisinopril (PRINIVIL, ZESTRIL) tablet 40 mg       ALLERGIES:  Allergies   Allergen Reactions   . Ciprofloxacin Rash   . Flagyl (Metronidazole Hcl) Rash   . Tetanus Toxoids Swelling       PMH:  Past Medical  History   Diagnosis Date   . Back pain    . High blood pressure    . Diabetes mellitus    . Hyperlipidemia LDL goal < 100    . Colitis, ulcerative        SH:  History   Substance Use Topics   . Smoking status: Never Smoker    . Smokeless tobacco: Never Used   . Alcohol Use: No       VS:   vitals were not taken for this visit.   There is no weight on file to calculate BMI.     LABS:  Results for orders placed in visit on 03/15/12   GLYCOSYLATED HGB(A1C), BLOOD       Result Value Range    Glyco Hgb (A1C) 8.3 (*) 4.8 - 5.9 %   LIPID(CHOL FRACT) PANEL, BLOOD       Result Value Range    Cholesterol 250 (*) <200 mg/dL    HDL-Cholesterol 48      LDL-Chol (Calc) 165 (*) <160 mg/dL    Triglycerides 213 (*) 10 - 170 mg/dL   CONFIRM OPIATES, URINE       Result Value Range    6 Acetylmorphine, Urine Negative, <10  Negative ng/mL    Codeine, Urine Negative, <100  Negative ng/mL    Morphine, Urine Negative, <100  Negative ng/mL    Fentanyl,  Urine Negative, <10  Negative ng/mL    Hydrocodone, Urine Negative, <100  Negative ng/mL    Hydromorphone, Urine Negative, <100  Negative ng/mL    Norfentanyl, Urine Negative, <10  Negative ng/mL    EDDP, Urine Negative, <100  Negative ng/mL    Methadone, Urine Negative, <100  Negative ng/mL    Oxycodone,  Urine Negative, <100  Negative ng/mL    Oxymorphone, Urine Negative, <100  Negative ng/mL    Interpretation See comment           ASSESSMENT/PLAN    1) Introduction to Diabetes:       The following topics were covered:   Types of diabetes   Pathophysiology   Etiology   Symptoms of hyperglycemia   Diagnosis of pre-diabetes and diabetes   Goals for fasting plasma glucose, post-prandial glucose and A1C  2) Healthy Eating:       The following topics were covered:   Food and nutrient groups   Carbohydrates   Simple and complex; starch, fruits, dairy, drinks; role in raising post-prandial glucose levels; carb counting; free foods   Protein   Fats   Others: fiber, sweeteners, snacks,  sodium, alcohol   Frequency of eating meals and snacks   Reading and interpreting food labels: carbs, calories, sodium   How to measure  food amount with every day items as a guide   Eating out/Reading restaurant menu   Portion control   Healthy eating resources. Provided Nutrition Planning booklet and instructed on reading food labels and carb tables using food models and the booklet   Goal setting for healthy eating/Meal planning  3) Taking Medications:       The following topics were covered:   Role and types of different oral and injectable medications in lowering blood glucose   Brief overview of insulin therapy (basal and bolus)   Importance of medication adherence   Identifying barriers to taking medications  4) Glucose Monitoring:       The following topics were covered:   Benefits of monitoring blood glucose   Target blood glucose levels; fasting, before meals, 2 hours after meals   Interpreting blood glucose values   Appropriate glucometer technique   Alternate site testing   Disposal of used lancets   Frequency of monitoring   Identifying barriers to monitoring   Setting goals for monitoring  ASSESSMENT: Pt has T2DM  and most recent A1C is 8.3 on metformin and glipizide. Given her GI pain and possible intolerance to higher doses or metformin, would recommend to add additional Tx for her diabetes (e.g.basal insulin versus a DPP-4 agent). Will review her SMBG at next visit. Pt exercises regularly. Wants to know ways to eat healthier and understand nutrition fact labels. Will encourage her to limit carbs to 60g/meal (15gm carbs per snack between meals).   PLAN   1. Record FBG daily and bring to next class.  2. Keep food diary for 1 week   3. Limit carbs to 60g/meal and 15gm snack between each meal   4. Continue to exercise regularly   5. Referral for Ophthalmologist placed today  6. Recommend diabetic foot exam at next PCP visit  Follow-Up  Clinic follow up in  < 2 weeks for Session 2  class  Cloyd Stagers, PS4  Vaughan Basta, Pharm.D., CDE

## 2012-04-01 ENCOUNTER — Telehealth (INDEPENDENT_AMBULATORY_CARE_PROVIDER_SITE_OTHER): Payer: Self-pay | Admitting: Internal Medicine

## 2012-04-01 NOTE — Telephone Encounter (Signed)
Please close encounter when done.

## 2012-04-01 NOTE — Telephone Encounter (Signed)
Prescription Refill Request Received via fax    Last visit Date: 8.5.13  Last Refill request per fax: 7.29.13  Next OV:  NONE     Medications:   traMADol (ULTRAM) 50 MG tablet       Name of Pharmacy in patients demographics? Yes   If not please update patient's pharmacy demographics.

## 2012-04-01 NOTE — Telephone Encounter (Signed)
Pt no longer taking Tramadol. Faxed to Pharmacy. Closing

## 2012-04-02 ENCOUNTER — Telehealth (INDEPENDENT_AMBULATORY_CARE_PROVIDER_SITE_OTHER): Payer: Self-pay | Admitting: Internal Medicine

## 2012-04-02 NOTE — Telephone Encounter (Signed)
Pt called and is upset that she is no longer getting Tramadol. She requested refills yesterday, Per Dr. Melissa Montane last office visit note. Tramadol was discontinued and Norco was reduced to 3 per day. Pt states that she was not aware that Tramadol was going to be discontinued and that 3 Norco per day is not enough to manage her pain. Pt very upset and requesting to have Tramadol ordered to take along with the 3 Norco per day.

## 2012-04-02 NOTE — Telephone Encounter (Signed)
This will have to wait until Dr. Melissa Montane returns.

## 2012-04-05 NOTE — Telephone Encounter (Signed)
Called Pt, She states she does not have the money to come in right now and her car broke down and needs to fix it. Pt has agreed to schedule her f/u with Pain management. I notified her that she will not be getting any pain meds refilled until she is seen for her f/u. Pt verbalized understanding and will schedule her appt. Closing

## 2012-04-05 NOTE — Telephone Encounter (Signed)
I specifically discussed with her that I wanted her to take only one pain medication, which she agreed to use the Norco for now, and limit to 3/day - she is suppose to follow-up with me to re-assess her pain regimen.    Dr. Melissa Montane

## 2012-04-06 ENCOUNTER — Telehealth (INDEPENDENT_AMBULATORY_CARE_PROVIDER_SITE_OTHER): Payer: Self-pay | Admitting: Internal Medicine

## 2012-04-06 MED ORDER — TRAMADOL HCL 50 MG OR TABS
50.0000 mg | ORAL_TABLET | ORAL | Status: AC
Start: 2012-04-06 — End: 2012-04-26

## 2012-04-06 NOTE — Telephone Encounter (Signed)
Pt has schedule appt 04/26/2012 @ 11:20am with Dr. Melissa Montane. Pt requesting call back when medication Tramadol has been approved.

## 2012-04-06 NOTE — Telephone Encounter (Signed)
Pt notified. Closing.

## 2012-04-06 NOTE — Telephone Encounter (Signed)
Rx for tramadol signed.     Dr. Melissa Montane

## 2012-04-06 NOTE — Telephone Encounter (Signed)
Per Dr. Melissa Montane, Pt is no longer taking Tramadol. Already spoke to Pt about this. See previous encounter. Closing

## 2012-04-06 NOTE — Telephone Encounter (Signed)
Prescription Refill Request Received via fax    Last visit Date: 8.5.13  Last Refill request per fax: 7.29.13  Next OV: 9.16.13   [name]     Medications:   traMADol (ULTRAM) 50 MG tablet       Name of Pharmacy in patients demographics? Yes   If not please update patient's pharmacy demographics.

## 2012-04-06 NOTE — Telephone Encounter (Signed)
FYI to Dr. Melissa Montane.     Please advise.

## 2012-04-13 ENCOUNTER — Other Ambulatory Visit (INDEPENDENT_AMBULATORY_CARE_PROVIDER_SITE_OTHER): Payer: Self-pay | Admitting: Internal Medicine

## 2012-04-13 NOTE — Telephone Encounter (Signed)
Prescription Refill Request Received via fax    Last visit Date: 8.5.13  Last Refill request per fax: 8.8.13  Next OV: 9.16.13   [name]     Medications:   NORCO 7.5-325 TABLET     Name of Pharmacy in patients demographics? Yes   If not please update patient's pharmacy demographics.

## 2012-04-14 ENCOUNTER — Telehealth (INDEPENDENT_AMBULATORY_CARE_PROVIDER_SITE_OTHER): Payer: Self-pay | Admitting: Internal Medicine

## 2012-04-14 MED ORDER — HYDROCODONE-ACETAMINOPHEN 7.5-325 MG OR TABS
1.0000 | ORAL_TABLET | Freq: Three times a day (TID) | ORAL | Status: AC | PRN
Start: 2012-04-14 — End: 2012-07-13

## 2012-04-14 NOTE — Telephone Encounter (Signed)
Already done

## 2012-04-14 NOTE — Telephone Encounter (Signed)
From: Larey Days  To: [name], Vesta Mixer, MD  Sent: 04/14/2012 3:54 PM PDT  Subject: Medication Renewal Request    Original authorizing provider: Encarnacion Slates, MD    Larey Days would like a refill of the following medications:  hydrocodone-acetaminophen (NORCO) 7.5-325 MG per tablet Encarnacion Slates, MD]    Preferred pharmacy: [id number] -- [address] [place], [place] [address] -- [phone] -- [phone]    Comment:      Medication renewals requested in this message routed to other providers:  LORazepam (ATIVAN) 1 MG tablet Buena Irish, MD]

## 2012-04-14 NOTE — Telephone Encounter (Signed)
Received refill request: 04/14/12  Last refilled: 03/15/12  Last Visit:  03/15/12  New Visit: 04/26/12  No Shows: none  Recent ED visits: none    Comment: Received refill request, Routing to MD for review.  Lab Results   Component Value Date    NA 132 10/26/2011    K 4.7 10/26/2011    CL 97 10/26/2011    BICARB 20 10/26/2011    BUN 15 10/26/2011    CREAT 0.50 10/26/2011    GLU 522 10/26/2011    [place] 9.7 10/26/2011

## 2012-04-15 NOTE — Telephone Encounter (Signed)
From: Larey Days  To: Buena Irish, MD  Sent: 04/14/2012 3:54 PM PDT  Subject: Medication Renewal Request    Original authorizing provider: Buena Irish, MD    Larey Days would like a refill of the following medications:  LORazepam (ATIVAN) 1 MG tablet Buena Irish, MD]    Preferred pharmacy: [facility] #[id number] -- [address] [place], [place] [address] -- [phone] -- [phone]    Comment:      Medication renewals requested in this message routed to other providers:  hydrocodone-acetaminophen (NORCO) 7.5-325 MG per tablet Encarnacion Slates, MD]

## 2012-04-19 ENCOUNTER — Other Ambulatory Visit (INDEPENDENT_AMBULATORY_CARE_PROVIDER_SITE_OTHER): Payer: Self-pay | Admitting: Internal Medicine

## 2012-04-19 NOTE — Telephone Encounter (Signed)
Controlled Substance Prescription Request Received via fax    Last visit Date: 8.5.13  Last Refill request per fax: 8.7.13  Next OV: 9.16.13   [name]     Medications:   LORazepam (ATIVAN) 1 MG tablet      Name of Pharmacy in patients demographics? Yes   If not please update patient's pharmacy demographics.

## 2012-04-20 NOTE — Telephone Encounter (Signed)
Received refill request: 04/20/12  Last refilled: 03/15/12  Last Visit: 03/15/12  New Visit: 04/26/12  No Shows: none  Recent ED visits: none    Comment: Received refill request, Routing to MD for review.  Lab Results   Component Value Date    NA 132 10/26/2011    K 4.7 10/26/2011    CL 97 10/26/2011    BICARB 20 10/26/2011    BUN 15 10/26/2011    CREAT 0.50 10/26/2011    GLU 522 10/26/2011    [place] 9.7 10/26/2011

## 2012-04-20 NOTE — Telephone Encounter (Signed)
Pt is requesting status on her refill below, pt states she requested this since 9/4. Please advise

## 2012-04-21 ENCOUNTER — Telehealth (INDEPENDENT_AMBULATORY_CARE_PROVIDER_SITE_OTHER): Payer: Self-pay | Admitting: Internal Medicine

## 2012-04-21 MED ORDER — LORAZEPAM 1 MG OR TABS
1.0000 mg | ORAL_TABLET | Freq: Three times a day (TID) | ORAL | Status: DC | PRN
Start: 2012-04-20 — End: 2012-10-06

## 2012-04-21 NOTE — Telephone Encounter (Signed)
Pharmacy 3RD request for the rx below.

## 2012-04-21 NOTE — Telephone Encounter (Signed)
Pt had this filled by PCP for anxiety and has getting a few refills. If she continues to need this med refilled as often, she should follow up in clinic to discuss anxiety.

## 2012-04-21 NOTE — Telephone Encounter (Signed)
No response, Routing to cross cover.

## 2012-04-21 NOTE — Telephone Encounter (Signed)
Pharmacy 3rd request rx below.

## 2012-04-21 NOTE — Telephone Encounter (Signed)
Pt is calling requesting status on her refill below, please call pt with this information today.

## 2012-04-22 ENCOUNTER — Telehealth (INDEPENDENT_AMBULATORY_CARE_PROVIDER_SITE_OTHER): Payer: Self-pay | Admitting: Internal Medicine

## 2012-04-22 NOTE — Telephone Encounter (Signed)
From: Katherine Church  To: Buena Irish, MD  Sent: 04/21/2012 12:14 PM PDT  Subject: Medication Renewal Request    Original authorizing provider: Buena Irish, MD    Katherine Church would like a refill of the following medications:  LORazepam (ATIVAN) 1 MG tablet Buena Irish, MD]    Preferred pharmacy: [facility] #[id number] -- [address] [place], [place] [address] -- [phone] -- [phone]    Comment:      Medication renewals requested in this message routed to other providers:  traMADol (ULTRAM) 50 MG tablet Encarnacion Slates, MD]

## 2012-04-22 NOTE — Telephone Encounter (Signed)
Patient states she will be stopping by today to pick up her prescription for Ativan and tramadol. Please have print out ready for patient.

## 2012-04-22 NOTE — Telephone Encounter (Signed)
From: Larey Days  To: [name], Vesta Mixer, MD  Sent: 04/21/2012 12:14 PM PDT  Subject: Medication Renewal Request    Original authorizing provider: Encarnacion Slates, MD    Larey Days would like a refill of the following medications:  traMADol (ULTRAM) 50 MG tablet Encarnacion Slates, MD]    Preferred pharmacy: [facility] #[id number] -- [address] [place], [place] [address] -- [phone] -- [phone]    Comment:      Medication renewals requested in this message routed to other providers:  LORazepam (ATIVAN) 1 MG tablet Buena Irish, MD]

## 2012-04-22 NOTE — Telephone Encounter (Signed)
Called Pt, she does not need to pick up order for Rx here at Internal Medicine, Ativan was called into Pharmacy and Pt will pick up medication at Pharmacy. Pt also requesting her Tramadol refill, Was last ordered on 04/06/12 for 40 tablets. Routing to MD for review.

## 2012-04-22 NOTE — Telephone Encounter (Signed)
LOV 03/15/12 [address] patient

## 2012-04-23 NOTE — Telephone Encounter (Signed)
Pt is calling requesting status on her refill for Tramadol. Informed pt it is still pending review/approval by MD.

## 2012-04-23 NOTE — Telephone Encounter (Signed)
Done. See previous encounter. Closing

## 2012-04-26 ENCOUNTER — Encounter (INDEPENDENT_AMBULATORY_CARE_PROVIDER_SITE_OTHER): Payer: PRIVATE HEALTH INSURANCE | Admitting: Internal Medicine

## 2012-04-26 NOTE — Telephone Encounter (Signed)
Dr. Melissa Montane, if ok please sign.  Thanks Talha Iser     LOV: 08 05 13  NOV: 09 26 13  Last Refill: 08 27 13   End Date: 09 16 13

## 2012-04-26 NOTE — Telephone Encounter (Signed)
SHe was supposed to stop taking tramadol and follow up with me a few weeks ago, so I am declining to refill the tramadol. I refilled her Norco earlier this month.    Dr. Melissa Montane

## 2012-04-26 NOTE — Telephone Encounter (Signed)
Per Dr.McGee and Dr. Melissa Montane, Too early for Tramadol refill. Pt made aware of this. Verbalized understanding. Closing

## 2012-04-26 NOTE — Telephone Encounter (Signed)
Pt cancelled appointment with Dr. Melissa Montane today.

## 2012-04-26 NOTE — Telephone Encounter (Signed)
Patient needs to make an appointment with Dr. Mila Palmer or Cedarquist prior to getting this medication

## 2012-04-27 ENCOUNTER — Ambulatory Visit (INDEPENDENT_AMBULATORY_CARE_PROVIDER_SITE_OTHER): Payer: PRIVATE HEALTH INSURANCE | Admitting: Pharmacotherapy

## 2012-04-27 NOTE — Telephone Encounter (Signed)
Noted.     Pt has a follow up appointment on 05/06/12. Medications will be discussed during office visit.    Forwarding to Dr. Melissa Montane.

## 2012-04-27 NOTE — Telephone Encounter (Signed)
Patient would like to the status on her refill request for tramadol. Please call patient at [phone]

## 2012-04-27 NOTE — Telephone Encounter (Signed)
Pt states that she is having a financial hardship and her car is broken down.  Since she is self pay she can't afford frequent trips to see her doctors.  She has had to cancel some of her other DM meds due to cost.  Pt has rescheduled her appt with you till 05/06/12.

## 2012-04-30 ENCOUNTER — Telehealth (INDEPENDENT_AMBULATORY_CARE_PROVIDER_SITE_OTHER): Payer: Self-pay | Admitting: Internal Medicine

## 2012-04-30 NOTE — Telephone Encounter (Signed)
She filled an Rx for Norco on 9/6 so she is not due for this, and I discontinued tramadol, so I won't refill it. I cannot give her further refills if she cannot f/u with me. She needs to work with a physician at a location she can access for follow up.     Dr. Melissa Montane

## 2012-04-30 NOTE — Telephone Encounter (Signed)
Routing to Dr. Mila Palmer and Dr. Melissa Montane. Pt requesting Pain meds. Pt has been made aware of need for scheduling an appointment and being seen for further Pain Med refills. Pt insists on speaking with PCP as she cannot come in for an appointment do to financial hardship.

## 2012-04-30 NOTE — Telephone Encounter (Signed)
Pt requesting call back from PCP in regards to medication Tramadol. Pt states she is going through financial issues and can not make appointment.

## 2012-05-04 NOTE — Telephone Encounter (Signed)
Pt has been notified that she  Cannot receive any Pain meds without f/u with MD per Dr. Melissa Montane. Pt verbalized understanding and will f/u when she can. Closing

## 2012-05-04 NOTE — Telephone Encounter (Signed)
Routing to Lupe.

## 2012-05-06 ENCOUNTER — Encounter (INDEPENDENT_AMBULATORY_CARE_PROVIDER_SITE_OTHER): Payer: PRIVATE HEALTH INSURANCE | Admitting: Internal Medicine

## 2012-05-10 ENCOUNTER — Telehealth (INDEPENDENT_AMBULATORY_CARE_PROVIDER_SITE_OTHER): Payer: Self-pay | Admitting: Internal Medicine

## 2012-05-11 NOTE — Telephone Encounter (Signed)
Pt states she cancelled her appt w/ Dr Melissa Montane on 9/26 due to her financial situation but still needs her refill for Norco approved.  Please send [facility] [address] On file.

## 2012-05-12 NOTE — Telephone Encounter (Signed)
Verbally confirmed name of Primary Care Provider: yes    Last visit Date: 03/15/12  Next visit Date: 05/19/2012 with Dr. Mila Palmer.   If patient has not been seen in a over a year please secure appointment before forwarding message to nurse      Medications:  HYDROCODONE-ACETAMINOPHEN 7.5-325 MG   Pt requesting call back today in regards to medication.     Name of Pharmacy Updated in Demographics: yes    Name and phone number of pharmacy if not in Epic Database:

## 2012-05-13 ENCOUNTER — Telehealth (INDEPENDENT_AMBULATORY_CARE_PROVIDER_SITE_OTHER): Payer: Self-pay | Admitting: Internal Medicine

## 2012-05-13 ENCOUNTER — Encounter (INDEPENDENT_AMBULATORY_CARE_PROVIDER_SITE_OTHER): Payer: Self-pay | Admitting: Internal Medicine

## 2012-05-13 NOTE — Telephone Encounter (Signed)
Spoke to pt regarding message below.  Reviewed pain contract with pt, especially the following:      6.  Renewals will be provided at agreed upon intervals and are contingent on keeping scheduled appointments including pick-up appointments.  I will not phone for prescriptions after hours or on weekends unless it is a true emergency.    Explained to pt that these rules have been in place for a long while.  Advised pt to schedule an appointment then contact a friend for a ride.  Once that suggestion was provided, pt's story changed to her blood sugar and blood pressure being elevated.  Pt continued offering different versions as to why she couldn't keep her appointments.  Redirected pt and reviewed pain contract, reiterating #6 again.  Pt states her previous MD did not make her come back to be seen before refills.  Advised pt that this is not here previous MD's office and the protocol is followed closely.  She was given a refill once without coming back and should've scheduled her appointment or showed up to her appointments to prevent any disruptions with her medications.  Sympathized with pt's financial hardship and transportation stories but sternly reiterated the rules contained within the narcotic contract.     FYI to Dr. Melissa Montane, Dr. Mila Palmer, and LVN Villagrana.    Closing.

## 2012-05-13 NOTE — Telephone Encounter (Signed)
Reviewed chart.05/13/12  Pt non-compliant to pain contract and refills. Explaination # 6 reviewed with pt regarding rules of Refill on pain contract.See notes.    Placed call to pt C/O being in a bind d/t denied refill Norco. Pt reports she will proceed to the ED for her pain and B/P 190/115.  Pt thanked me for my return call.    FYI Dr.Cederquest and Loma Messing

## 2012-05-13 NOTE — Telephone Encounter (Signed)
Verbally confirmed name of Primary Care Provider: yes    What is reason for call: pt reports to have stomach pain and did not have a good night due to the pain and no pain medication to take. Pt is very upset because her Norco prescription has not been approved. Her BP is 190/115 and is requesting to speak to the triage nurse.     Confirmed Contact Number:yes    This message will be transmitted to our triage nurse, you can expect a call by the end of the working day.

## 2012-05-13 NOTE — Telephone Encounter (Signed)
Dr. Melissa Montane responded to pt via mychart.      Closing encounter.

## 2012-05-13 NOTE — Telephone Encounter (Signed)
Per Dr. Melissa Montane, Pt cannot receive any Pain Medication refills without being seen. Pt has been made aware of this several times and continues to cancel appointments and continues requesting refills. Created and sent a letter notifying Pt of this. Closing

## 2012-05-13 NOTE — Telephone Encounter (Signed)
From: Larey Days  To: [name], Vesta Mixer, MD  Sent: 05/13/2012 11:45 AM PDT  Subject: 6-General Complaint    I have left messages through your office that i could not make it due to a financial hardship from my [employer]. I had to make an appt with Dr. Mila Palmer on the 9th of this month due to my bloodsugar being in the 300's. Now by you not refilling my Norco you have put me in a Big distress.

## 2012-05-13 NOTE — Telephone Encounter (Signed)
Mychart message and well as narcotic contract sent via mychart.     Closing encounter.

## 2012-05-13 NOTE — Telephone Encounter (Signed)
Patient she is requesting to speak to [facility] in regards to her medication refill being denied by Dr. Melissa Montane. Patient states under what terms she has decided to not approved it, she states she needs to know what part of the contract has she violated that she had her medication denied. Please call patient back at home number.

## 2012-05-13 NOTE — Telephone Encounter (Signed)
Pt is calling to request status on her refill for Norco, pt is very upset because she is in pain and doesn't want to end up in thd ED. Please call pt with status today.

## 2012-05-19 ENCOUNTER — Encounter (INDEPENDENT_AMBULATORY_CARE_PROVIDER_SITE_OTHER): Payer: PRIVATE HEALTH INSURANCE | Admitting: Internal Medicine

## 2012-05-22 ENCOUNTER — Other Ambulatory Visit (INDEPENDENT_AMBULATORY_CARE_PROVIDER_SITE_OTHER): Payer: Self-pay | Admitting: Internal Medicine

## 2012-05-25 NOTE — Telephone Encounter (Signed)
Received refill request: 05/25/12  Last refilled: 04/20/12  Last Visit: 03/15/12  New Visit: none  No Shows: multiple cancelled and no shows  Recent ED visits: none    Comment: Received refill request, Verified Pharmacy. Routing to MD for review.  Lab Results   Component Value Date    NA 132 10/26/2011    K 4.7 10/26/2011    CL 97 10/26/2011    BICARB 20 10/26/2011    BUN 15 10/26/2011    CREAT 0.50 10/26/2011    GLU 522 10/26/2011    [place] 9.7 10/26/2011

## 2012-05-25 NOTE — Telephone Encounter (Signed)
From: Katherine Church  To: [name], MD  Sent: 05/22/2012 4:58 PM PDT  Subject: Medication Renewal Request    Original authorizing provider: [name], MD    Katherine Church would like a refill of the following medications:  LORazepam (ATIVAN) 1 MG tablet [name], MD]    Preferred pharmacy: [facility] [phone] -- [address] [place], [place] [address] -- [phone] -- [phone]    Comment:

## 2012-05-26 NOTE — Telephone Encounter (Signed)
Pt continues to request refills and has been notified several times that she will not receive any without coming in for a f/u appointment with Dr. Melissa Montane. Closing

## 2012-08-25 ENCOUNTER — Encounter: Payer: Self-pay | Admitting: Hospital

## 2012-10-06 ENCOUNTER — Encounter (HOSPITAL_COMMUNITY): Payer: Self-pay | Admitting: Hospitalist

## 2012-10-06 ENCOUNTER — Inpatient Hospital Stay (HOSPITAL_COMMUNITY): Payer: MEDICAID | Admitting: Hospitalist

## 2012-10-06 ENCOUNTER — Inpatient Hospital Stay
Admission: AD | Admit: 2012-10-06 | Discharge: 2012-10-11 | DRG: 872 | Disposition: A | Discharge status: Home / Self Care | Payer: MEDICAID | Attending: Internal Medicine | Admitting: Internal Medicine

## 2012-10-06 DIAGNOSIS — A4901 Methicillin susceptible Staphylococcus aureus infection, unspecified site: Secondary | ICD-10-CM | POA: Diagnosis present

## 2012-10-06 DIAGNOSIS — F411 Generalized anxiety disorder: Secondary | ICD-10-CM | POA: Diagnosis present

## 2012-10-06 DIAGNOSIS — K1120 Sialoadenitis, unspecified: Secondary | ICD-10-CM | POA: Diagnosis present

## 2012-10-06 DIAGNOSIS — A419 Sepsis, unspecified organism: Principal | ICD-10-CM | POA: Diagnosis present

## 2012-10-06 DIAGNOSIS — F419 Anxiety disorder, unspecified: Secondary | ICD-10-CM

## 2012-10-06 DIAGNOSIS — I10 Essential (primary) hypertension: Secondary | ICD-10-CM | POA: Diagnosis present

## 2012-10-06 DIAGNOSIS — Z794 Long term (current) use of insulin: Secondary | ICD-10-CM

## 2012-10-06 DIAGNOSIS — E119 Type 2 diabetes mellitus without complications: Secondary | ICD-10-CM

## 2012-10-06 DIAGNOSIS — L0201 Cutaneous abscess of face: Secondary | ICD-10-CM | POA: Diagnosis present

## 2012-10-06 DIAGNOSIS — E785 Hyperlipidemia, unspecified: Secondary | ICD-10-CM | POA: Diagnosis present

## 2012-10-06 DIAGNOSIS — L0390 Cellulitis, unspecified: Secondary | ICD-10-CM

## 2012-10-06 DIAGNOSIS — IMO0001 Reserved for inherently not codable concepts without codable children: Secondary | ICD-10-CM | POA: Diagnosis present

## 2012-10-06 LAB — URINALYSIS
Bilirubin: NEGATIVE
Blood: NEGATIVE
Leuk Esterase: NEGATIVE
Nitrite: NEGATIVE
RBC: <1 (ref 0–?)
Specific Gravity: 1.023 (ref 1.002–1.030)
WBC: <1 (ref 0–?)
pH: 7 (ref 5.0–8.0)

## 2012-10-06 LAB — LACTATE, BLOOD: Lactate: 7.1 mg/dL (ref 4.5–19.8)

## 2012-10-06 LAB — GLUCOSE (POCT): Glucose (POCT): 289 mg/dL — ABNORMAL HIGH (ref 70–115)

## 2012-10-06 MED ORDER — DEXTROSE 50 % IV SOLN
12.5000 g | INTRAVENOUS | Status: DC | PRN
Start: 2012-10-06 — End: 2012-10-11

## 2012-10-06 MED ORDER — TRAZODONE HCL 50 MG OR TABS
50.00 mg | ORAL_TABLET | Freq: Every evening | ORAL | Status: AC
Start: 2012-10-06 — End: 2012-10-06
  Administered 2012-10-06: 50 mg via ORAL
  Filled 2012-10-06: qty 1

## 2012-10-06 MED ORDER — LANSOPRAZOLE 15 MG OR CPDR
15.0000 mg | DELAYED_RELEASE_CAPSULE | Freq: Every day | ORAL | Status: DC
Start: 2012-10-07 — End: 2012-10-11
  Administered 2012-10-07 – 2012-10-11 (×5): 15 mg via ORAL
  Filled 2012-10-06 (×5): qty 1

## 2012-10-06 MED ORDER — HYDROMORPHONE HCL 1 MG/ML IJ SOLN
1.00 mg | Freq: Once | INTRAMUSCULAR | Status: AC
Start: 2012-10-06 — End: 2012-10-06
  Administered 2012-10-06: 1 mg via INTRAVENOUS
  Filled 2012-10-06: qty 1

## 2012-10-06 MED ORDER — ONDANSETRON HCL 4 MG/2ML IV SOLN
4.00 mg | Freq: Four times a day (QID) | INTRAMUSCULAR | Status: AC | PRN
Start: 2012-10-06 — End: 2012-10-06

## 2012-10-06 MED ORDER — AMLODIPINE 10 MG OR TABS
10.0000 mg | ORAL_TABLET | Freq: Every day | ORAL | Status: DC
Start: 2012-10-07 — End: 2012-10-11
  Administered 2012-10-07 – 2012-10-11 (×5): 10 mg via ORAL
  Filled 2012-10-06 (×6): qty 1

## 2012-10-06 MED ORDER — SODIUM CHLORIDE 0.9 % IV SOLN
INTRAVENOUS | Status: AC
Start: 2012-10-06 — End: 2012-10-07

## 2012-10-06 MED ORDER — SODIUM CHLORIDE 0.9 % IJ SOLN (CUSTOM)
3.0000 mL | INTRAMUSCULAR | Status: DC | PRN
Start: 2012-10-06 — End: 2012-10-11
  Administered 2012-10-10: 3 mL via INTRAVENOUS

## 2012-10-06 MED ORDER — DEXTROSE (DIABETIC USE) 40 % OR GEL
1.0000 | ORAL | Status: DC | PRN
Start: 2012-10-06 — End: 2012-10-11

## 2012-10-06 MED ORDER — SODIUM CHLORIDE 0.9 % IV SOLN
1500.00 mg | Freq: Once | INTRAVENOUS | Status: AC
Start: 2012-10-06 — End: 2012-10-06
  Administered 2012-10-06: 1500 mg via INTRAVENOUS
  Filled 2012-10-06: qty 1500

## 2012-10-06 MED ORDER — DIAZEPAM 5 MG OR TABS
5.0000 mg | ORAL_TABLET | Freq: Two times a day (BID) | ORAL | Status: DC | PRN
Start: 2012-10-06 — End: 2012-10-11
  Administered 2012-10-07 – 2012-10-11 (×4): 5 mg via ORAL
  Filled 2012-10-06 (×5): qty 1

## 2012-10-06 MED ORDER — ONDANSETRON HCL 4 MG/2ML IV SOLN
4.00 mg | Freq: Once | INTRAMUSCULAR | Status: AC
Start: 2012-10-06 — End: 2012-10-06
  Administered 2012-10-06: 4 mg via INTRAVENOUS
  Filled 2012-10-06: qty 2

## 2012-10-06 MED ORDER — ONDANSETRON HCL 4 MG/2ML IV SOLN
INTRAMUSCULAR | Status: DC
Start: 2012-10-06 — End: 2012-10-06
  Filled 2012-10-06: qty 2

## 2012-10-06 MED ORDER — DOCUSATE SODIUM 250 MG OR CAPS
250.0000 mg | ORAL_CAPSULE | Freq: Two times a day (BID) | ORAL | Status: DC
Start: 2012-10-07 — End: 2012-10-11
  Administered 2012-10-07 – 2012-10-11 (×9): 250 mg via ORAL
  Filled 2012-10-06 (×10): qty 1

## 2012-10-06 MED ORDER — HYDROCODONE-ACETAMINOPHEN 5-325 MG OR TABS
1.00 | ORAL_TABLET | Freq: Once | ORAL | Status: AC
Start: 2012-10-06 — End: 2012-10-06

## 2012-10-06 MED ORDER — HYDROMORPHONE HCL 1 MG/ML IJ SOLN
0.5000 mg | INTRAMUSCULAR | Status: DC | PRN
Start: 2012-10-06 — End: 2012-10-08

## 2012-10-06 MED ORDER — ONDANSETRON HCL 4 MG/2ML IV SOLN
4.00 mg | Freq: Once | INTRAMUSCULAR | Status: AC
Start: 2012-10-06 — End: 2012-10-06
  Administered 2012-10-06: 4 mg via INTRAVENOUS

## 2012-10-06 MED ORDER — HYDROCODONE-ACETAMINOPHEN 5-325 MG OR TABS
ORAL_TABLET | ORAL | Status: AC
Start: 2012-10-06 — End: 2012-10-06
  Administered 2012-10-06: 1 via ORAL
  Filled 2012-10-06: qty 1

## 2012-10-06 MED ORDER — LISINOPRIL 40 MG OR TABS
40.0000 mg | ORAL_TABLET | Freq: Two times a day (BID) | ORAL | Status: DC
Start: 2012-10-06 — End: 2012-10-11
  Administered 2012-10-06 – 2012-10-11 (×9): 40 mg via ORAL
  Filled 2012-10-06 (×10): qty 1

## 2012-10-06 MED ORDER — VANCOMYCIN HCL 1000 MG IV SOLR
1000.0000 mg | Freq: Three times a day (TID) | INTRAVENOUS | Status: DC
Start: 2012-10-06 — End: 2012-10-08
  Administered 2012-10-07 – 2012-10-08 (×5): 1000 mg via INTRAVENOUS
  Filled 2012-10-06 (×5): qty 1000

## 2012-10-06 MED ORDER — DIPHENHYDRAMINE HCL 50 MG/ML IJ SOLN
25.00 mg | Freq: Once | INTRAMUSCULAR | Status: AC
Start: 2012-10-06 — End: 2012-10-06
  Administered 2012-10-06: 25 mg via INTRAVENOUS
  Filled 2012-10-06: qty 50

## 2012-10-06 MED ORDER — GLUCAGON HCL (RDNA) 1 MG IJ SOLR
1.0000 mg | Freq: Once | INTRAMUSCULAR | Status: DC | PRN
Start: 2012-10-06 — End: 2012-10-11

## 2012-10-06 MED ORDER — CLINDAMYCIN PHOSPHATE IN D5W 600 MG/50ML IV SOLN
600.00 mg | Freq: Once | INTRAVENOUS | Status: AC
Start: 2012-10-06 — End: 2012-10-06
  Administered 2012-10-06: 600 mg via INTRAVENOUS
  Filled 2012-10-06: qty 50

## 2012-10-06 MED ORDER — INSULIN LISPRO (HUMAN) 100 UNIT/ML SC SOLN (CUSTOM)
1.0000 [IU] | Freq: Four times a day (QID) | INTRAMUSCULAR | Status: DC
Start: 2012-10-06 — End: 2012-10-11
  Administered 2012-10-06: 23:00:00 2 [IU] via SUBCUTANEOUS
  Administered 2012-10-07: 3 [IU] via SUBCUTANEOUS
  Administered 2012-10-07: 22:00:00 2 [IU] via SUBCUTANEOUS
  Administered 2012-10-07: 5 [IU] via SUBCUTANEOUS
  Administered 2012-10-07: 4 [IU] via SUBCUTANEOUS
  Administered 2012-10-08: 5 [IU] via SUBCUTANEOUS
  Administered 2012-10-08 (×2): 4 [IU] via SUBCUTANEOUS
  Administered 2012-10-08: 21:00:00 3 [IU] via SUBCUTANEOUS
  Administered 2012-10-09: 4 [IU] via SUBCUTANEOUS
  Administered 2012-10-09: 18:00:00 3 [IU] via SUBCUTANEOUS
  Administered 2012-10-09: 4 [IU] via SUBCUTANEOUS
  Administered 2012-10-09 – 2012-10-10 (×3): 3 [IU] via SUBCUTANEOUS
  Administered 2012-10-10: 21:00:00 2 [IU] via SUBCUTANEOUS
  Administered 2012-10-11: 10:00:00 3 [IU] via SUBCUTANEOUS
  Administered 2012-10-11: 14:00:00 4 [IU] via SUBCUTANEOUS
  Filled 2012-10-06: qty 4
  Filled 2012-10-06: qty 3
  Filled 2012-10-06: qty 4
  Filled 2012-10-06: qty 3
  Filled 2012-10-06 (×2): qty 1
  Filled 2012-10-06: qty 5
  Filled 2012-10-06 (×2): qty 1
  Filled 2012-10-06 (×2): qty 4

## 2012-10-06 MED ORDER — SODIUM CHLORIDE 0.9 % IV SOLN
INTRAVENOUS | Status: DC
Start: 2012-10-06 — End: 2012-10-06

## 2012-10-06 MED ORDER — HYDROMORPHONE HCL 1 MG/ML IJ SOLN
1.0000 mg | INTRAMUSCULAR | Status: DC | PRN
Start: 2012-10-06 — End: 2012-10-08
  Administered 2012-10-06 – 2012-10-07 (×3): 1 mg via INTRAVENOUS
  Filled 2012-10-06 (×3): qty 1

## 2012-10-06 MED ORDER — DIAZEPAM 5 MG OR TABS
5.00 mg | ORAL_TABLET | Freq: Two times a day (BID) | ORAL | Status: DC | PRN
Start: ? — End: 2015-01-05

## 2012-10-06 MED ORDER — ENOXAPARIN SODIUM 30 MG/0.3ML SC SOLN
30.0000 mg | Freq: Two times a day (BID) | SUBCUTANEOUS | Status: DC
Start: 2012-10-06 — End: 2012-10-07
  Administered 2012-10-06 – 2012-10-07 (×2): 30 mg via SUBCUTANEOUS
  Filled 2012-10-06 (×2): qty 0.3

## 2012-10-06 MED ORDER — SODIUM CHLORIDE 0.9 % IV SOLN
Freq: Once | INTRAVENOUS | Status: AC
Start: 2012-10-06 — End: 2012-10-06

## 2012-10-06 MED ORDER — CARVEDILOL 25 MG OR TABS
25.0000 mg | ORAL_TABLET | Freq: Two times a day (BID) | ORAL | Status: DC
Start: 2012-10-07 — End: 2012-10-11
  Administered 2012-10-07 – 2012-10-11 (×8): 25 mg via ORAL
  Filled 2012-10-06 (×9): qty 1

## 2012-10-06 MED ORDER — CLINDAMYCIN PHOSPHATE 600 MG/4ML IJ SOLN
600.0000 mg | Freq: Once | INTRAMUSCULAR | Status: DC
Start: 2012-10-06 — End: 2012-10-06
  Filled 2012-10-06: qty 4

## 2012-10-06 MED ORDER — CLONIDINE HCL 0.3 MG OR TABS
0.3000 mg | ORAL_TABLET | Freq: Three times a day (TID) | ORAL | Status: DC
Start: 2012-10-06 — End: 2012-10-11
  Administered 2012-10-06 – 2012-10-11 (×14): 0.3 mg via ORAL
  Filled 2012-10-06 (×15): qty 1

## 2012-10-06 MED ORDER — SODIUM CHLORIDE 0.9 % IV SOLN
3375.0000 mg | Freq: Three times a day (TID) | INTRAVENOUS | Status: DC
Start: 2012-10-06 — End: 2012-10-08
  Administered 2012-10-06 – 2012-10-08 (×5): 3375 mg via INTRAVENOUS
  Filled 2012-10-06 (×5): qty 3375

## 2012-10-06 MED ORDER — GLUCOSE 4 GM PO CHEW (CUSTOM)
4.0000 | CHEWABLE_TABLET | ORAL | Status: DC | PRN
Start: 2012-10-06 — End: 2012-10-11

## 2012-10-06 MED ORDER — HYDROMORPHONE HCL 1 MG/ML IJ SOLN
1.00 mg | INTRAMUSCULAR | Status: AC | PRN
Start: 2012-10-06 — End: 2012-10-06
  Administered 2012-10-06: 1 mg via INTRAVENOUS
  Filled 2012-10-06: qty 1

## 2012-10-06 MED ORDER — ATORVASTATIN CALCIUM 20 MG OR TABS
20.0000 mg | ORAL_TABLET | Freq: Every evening | ORAL | Status: DC
Start: 2012-10-07 — End: 2012-10-11
  Administered 2012-10-07 – 2012-10-09 (×3): 20 mg via ORAL
  Filled 2012-10-06: qty 2
  Filled 2012-10-06: qty 1
  Filled 2012-10-06: qty 2
  Filled 2012-10-06: qty 1

## 2012-10-06 MED ORDER — SODIUM CHLORIDE 0.9 % IJ SOLN (CUSTOM)
3.0000 mL | Freq: Three times a day (TID) | INTRAMUSCULAR | Status: DC
Start: 2012-10-06 — End: 2012-10-11
  Administered 2012-10-08 – 2012-10-11 (×9): 3 mL via INTRAVENOUS

## 2012-10-06 MED ORDER — INSULIN REGULAR HUMAN 100 UNIT/ML IJ SOLN
4.00 [IU] | Freq: Once | INTRAMUSCULAR | Status: AC
Start: 2012-10-06 — End: 2012-10-06
  Administered 2012-10-06: 4 [IU] via SUBCUTANEOUS
  Filled 2012-10-06: qty 4

## 2012-10-06 MED ORDER — VANCOMYCIN HCL 1000 MG IV SOLR
1000.0000 mg | Freq: Two times a day (BID) | INTRAVENOUS | Status: DC
Start: 2012-10-06 — End: 2012-10-06

## 2012-10-06 MED ORDER — ONDANSETRON HCL 4 MG/2ML IV SOLN
4.0000 mg | Freq: Four times a day (QID) | INTRAMUSCULAR | Status: DC | PRN
Start: 2012-10-06 — End: 2012-10-11
  Administered 2012-10-09 – 2012-10-10 (×3): 4 mg via INTRAVENOUS
  Filled 2012-10-06 (×3): qty 2

## 2012-10-06 NOTE — ED Provider Notes (Signed)
History  Chief Complaint   Patient presents with   . Ear Pain     Sent from clinic for eval of rt ear/jaw infection. Unstable bs, fevers/chills.     HPI  Chief complaint right jaw pain    Patient is a 47 year old female history diabetes, ulcers colitis presents with chief complaint of right jaw pain and swelling and intermittent fevers for the last 3-4 days as well as hyperglycemia and nausea. Patient was [place] at a downtown clinic she is unsure the name of and had aspiration of at the clinic prior to arrival and brought this culture with her. She denies any other frustrated. She has noted increasing swelling of the last several days. She denies any tooth pain. She wonders if she had a flea bite her. Patient denies any abdominal pain shortness of breath cough dysuria or other complaints. No diarrhea or bright red blood per rectum or ulcerative colitis        Past Medical History   Diagnosis Date   . Back pain    . High blood pressure    . Diabetes mellitus    . Hyperlipidemia LDL goal < 100    . Colitis, ulcerative        Past Surgical History   Procedure Laterality Date   . Pb anesth,tubal ligation/transection  1994   . Colonoscopy  04/2011     Repeat surveillance colo in 2019       Family History   Problem Relation Age of Onset   . Diabetes Father    . Lipids Father    . Hypertension Father    . Hypertension Mother        History   Substance Use Topics   . Smoking status: Never Smoker    . Smokeless tobacco: Never Used   . Alcohol Use: No     Current facility-administered medications:clindamycin (CLEOCIN) injection 600 mg, 600 mg, IntraVENOUS, Once, Kathlynn Grate, MD;  HYDROmorphone (DILAUDID) injection 1 mg, 1 mg, IntraVENOUS, Once, Kathlynn Grate, MD;  lisinopril (PRINIVIL, ZESTRIL) tablet 40 mg, 40 mg, Oral, Daily, McGee, Lucilla Lame, MD;  sodium chloride 0.9 % 500 mL IV bolus, , IntraVENOUS, Once, Kathlynn Grate, MD  sodium chloride 0.9% infusion, , IntraVENOUS, Continuous, Kathlynn Grate, MD;  vancomycin (VANCOCIN) 1,500 mg in sodium chloride 0.9 % 250 mL IVPB, 1,500 mg, IntraVENOUS, Once, Kathlynn Grate, MD  Current outpatient prescriptions:amitriptyline (ELAVIL) 25 MG tablet, Take 1 tablet by mouth As Directed. 1 pill qhs x 2 weeks, then as tolerated, increase to 2 pills qhs, Disp: 60 tablet, Rfl: 0;  [DISCONTINUED] amLODIPINE (NORVASC) 10 MG tablet, Take 1 tablet by mouth daily., Disp: 90 tablet, Rfl: 3;  atorvastatin (LIPITOR) 20 MG tablet, Take 1 tablet by mouth daily., Disp: 90 tablet, Rfl: 1  [DISCONTINUED] Calcium Carbonate Antacid (TUMS PO), , Disp: , Rfl: ;  carvedilol (COREG) 25 MG tablet, Take 1 tablet by mouth 2 times daily., Disp: 180 tablet, Rfl: 3;  [DISCONTINUED] carvedilol (COREG) 25 MG tablet, Take 1 tablet by mouth 2 times daily., Disp: 60 tablet, Rfl: 2;  clonidine (CATAPRES) 0.3 MG tablet, Take 1 tablet by mouth 3 times daily., Disp: 180 tablet, Rfl: 3  [DISCONTINUED] clonidine (CATAPRES) 0.3 MG tablet, Take 1 tablet by mouth 3 times daily., Disp: 180 tablet, Rfl: 3;  dicyclomine (BENTYL) 10 MG capsule, Take 1 capsule by mouth 3 times daily (before meals)., Disp: 60 capsule, Rfl: 0;  docusate sodium (COLACE) 250 MG  capsule, Take 1 capsule by mouth 2 times daily., Disp: 60 capsule, Rfl: 0  glipiZIDE (GLUCOTROL) 10 MG tablet, Take 1 tablet by mouth 2 times daily. Take 1 tab in the morning and 1 tab before bedtime., Disp: 180 tablet, Rfl: 1;  lisinopril (PRINIVIL, ZESTRIL) 40 MG tablet, Take 1 tablet by mouth 2 times daily., Disp: 180 tablet, Rfl: 3;  LORazepam (ATIVAN) 1 MG tablet, Take 1 tablet by mouth every 8 hours as needed for Anxiety., Disp: 30 tablet, Rfl: 0  metFORMIN (GLUCOPHAGE XR) 500 MG XR tablet, Take 1 tablet by mouth daily., Disp: 30 tablet, Rfl: 3;  omeprazole (PRILOSEC) 20 MG capsule, Take 20 mg by mouth daily., Disp: , Rfl: ;  ondansetron (ZOFRAN) 4 MG tablet, Take 1 tablet by mouth every 8 hours as needed for Nausea/Vomiting., Disp: 30  tablet, Rfl: 0;  ondansetron (ZOFRAN) 4 MG tablet, Take 1 tablet by mouth every 8 hours as needed for Nausea/Vomiting., Disp: 15 tablet, Rfl: 0  polyethylene glycol (GLYCOLAX) powder, Take 17 g by mouth daily., Disp: 255 g, Rfl: 0;  senna (SENOKOT) 8.6 MG tablet, Take 1 tablet by mouth daily., Disp: 30 tablet, Rfl: 0;  senna (SENOKOT) 8.6 MG tablet, Take 1 tablet by mouth daily., Disp: 30 tablet, Rfl: 1  Allergies   Allergen Reactions   . Ciprofloxacin Rash   . Flagyl (Metronidazole Hcl) Rash   . Tetanus Toxoids Swelling     Sh no smk/et  Fh nc  ROS:  Denies HA, weakness, fatigue, dizziness, lightheadedness  Denies sore throat, rhinorrhea, difficulty swallowing, otalgia  + fevers, -and anight sweats, chills  Denies neck pain, neck stiffness, photophobia  Denies SOB, wheezing, cough, DOE, orthopnea, PND  Denies palpitations, chest pain,  Denies abdominal pain, vomiting, diarrhea,   Denies dysuria, hematuria, scrotal pain/swelling or vag d/c  Denies rash, LE swelling, pruritis  Denies focal weakness, numbness, tingling.   Denies difficulty with vision, swallowing.  Denies difficulty with ambulation or balance.     Review of Systems  above  Physical Exam  BP 168/104  Pulse 94  Temp(Src) 97.8 F (36.6 C)  Resp 20  Ht 5\' 4"  (1.626 m)  Wt 77.565 kg (171 lb)  BMI 29.34 kg/m2  SpO2 99%  .  Physical Exam  PE:  Pt well appearing inmild distress holding her jaw, afeb here  HEENT NCAT PERRLA EOMI O/P: clear, no tooth pain or intraoral swelling or pus at [place], +swelling angle R jaw with 2 cm induration post aspiration at clinic; no signif fluctuance but tender and in. +mild erythema extending downwards. No submandibular swelling +trismus +pain with opening/closing jaw, no diff swallowAnd aing voice change or submand nodes. In  Neck: supple, NT, no adenopathy, no stiffness, -thyromegaly noted  Lungs: CTA B -tachypnea/ wheeze/ rales noted  CV: RRR nls1s2 -MRG  Abd; soft NT ND nl BS -HSM -mass -guarding/rebound -flank  tenderness  Extr: -C/C/E -rash  Back: -TLS tenderness -flank pain  Neuro: A&Ox3 CNS 2-12 intact Motor: 5/5 B delt/bic/tric/infrasp/wr ext/flex/f/ext/io  5/5 B ilipsoas/quads/hamstr/h.adduc/h.abduc/gastr/tib ant/dorsif/plantarflesion  Sens intact throughout DTRS: 2+thorughout bic/tric/brach/patell/ankles nl gait  IMP:Pt is a 46yof hx DM, [place] with R facial infection/abscess angle jaw/cellulitis post aspiration outside clinic. Pt is allergic to tetanus. Plan labs, iv abxs allergic cipro/flagyl. Reports with elevated sugars labs pend. Have consulted ENT and ct pend.  ED Course/Medical Decision Making Narrative     see above          Critical Care Time  Additional Notes      Home Medication List  Prior to Admission Medications   Outpatient Medications Last Dose Informant Patient Reported? Taking?   Calcium Carbonate Antacid (TUMS PO)   Yes No   LORazepam (ATIVAN) 1 MG tablet   No No   Sig: Take 1 tablet by mouth every 8 hours as needed for Anxiety.   amLODIPINE (NORVASC) 10 MG tablet   No No   Sig: Take 1 tablet by mouth daily.   amitriptyline (ELAVIL) 25 MG tablet   No No   Sig: Take 1 tablet by mouth As Directed. 1 pill qhs x 2 weeks, then as tolerated, increase to 2 pills qhs   atorvastatin (LIPITOR) 20 MG tablet   No No   Sig: Take 1 tablet by mouth daily.   carvedilol (COREG) 25 MG tablet   No No   Sig: Take 1 tablet by mouth 2 times daily.   carvedilol (COREG) 25 MG tablet   No No   Sig: Take 1 tablet by mouth 2 times daily.   clonidine (CATAPRES) 0.3 MG tablet   No No   Sig: Take 1 tablet by mouth 3 times daily.   clonidine (CATAPRES) 0.3 MG tablet   No No   Sig: Take 1 tablet by mouth 3 times daily.   dicyclomine (BENTYL) 10 MG capsule   No No   Sig: Take 1 capsule by mouth 3 times daily (before meals).   docusate sodium (COLACE) 250 MG capsule   No No   Sig: Take 1 capsule by mouth 2 times daily.   glipiZIDE (GLUCOTROL) 10 MG tablet   No No   Sig: Take 1 tablet by mouth 2 times daily. Take 1 tab in the  morning and 1 tab before bedtime.   lisinopril (PRINIVIL, ZESTRIL) 40 MG tablet   No No   Sig: Take 1 tablet by mouth 2 times daily.   metFORMIN (GLUCOPHAGE XR) 500 MG XR tablet   No No   Sig: Take 1 tablet by mouth daily.   omeprazole (PRILOSEC) 20 MG capsule   Yes No   Sig: Take 20 mg by mouth daily.   ondansetron (ZOFRAN) 4 MG tablet   No No   Sig: Take 1 tablet by mouth every 8 hours as needed for Nausea/Vomiting.   ondansetron (ZOFRAN) 4 MG tablet   No No   Sig: Take 1 tablet by mouth every 8 hours as needed for Nausea/Vomiting.   polyethylene glycol (GLYCOLAX) powder   No No   Sig: Take 17 g by mouth daily.   senna (SENOKOT) 8.6 MG tablet   No No   Sig: Take 1 tablet by mouth daily.   senna (SENOKOT) 8.6 MG tablet   No No   Sig: Take 1 tablet by mouth daily.      Facility-Administered Medications Last Administration Doses Remaining   lisinopril (PRINIVIL, ZESTRIL) tablet 40 mg None recorded           Kathlynn Grate, MD  10/06/12 [phone]

## 2012-10-06 NOTE — ED Notes (Signed)
Productive cough. Pt states ha worsen every time she coughs.

## 2012-10-06 NOTE — ED Notes (Signed)
Itching and nausea resolved.

## 2012-10-06 NOTE — H&P (Signed)
HISTORY AND PHYSICAL    Attending MD:   Lawerance Sabal, MD    Chief Complaint:  Right jaw pain, swelling    Pain Assessment:  Pain Score: 9  Pain Location: Head  Pain Description: Constant    History of Present Illness:     Katherine Church is a 47 year old female with NIDDM, HTN, HLD, ulcerative colitis and chronic back pain who presents to the ED with right sided jaw swelling and pain.    Patient was cleaning rescue dogs with fleas on Saturday, had some bites on arms and maybe neck.  Woke up Sunday morning with painful swollen right jaw  Low grade temp.  Symptoms worsened over past several days.  Increased right ear pressure.  Unable to fully open mouth due to pain.  Painful swallowing.  Nausea but no vomiting.  Endorses HA.  No visual changes.  Some right sided neck stiffness.    Seen by PCP Dr. Roger Shelter today who aspirated a fluid sample (gm stain and culture pending) and sent patient to ED.    ED:  99.6 F (37.6 C) 103 16 ! 132/94 mmHg 96 %; vancomycin, clindamycin, norco, benadryl, 4 units regular insulin, zofran, dilaudid, NS      Past Medical and Surgical History:  Past Medical History   Diagnosis Date   . Back pain    . High blood pressure    . Diabetes mellitus    . Hyperlipidemia LDL goal < 100    . Colitis, ulcerative      Past Surgical History   Procedure Laterality Date   . Pb anesth,tubal ligation/transection  1994   . Colonoscopy  04/2011     Repeat surveillance colo in 2019       Allergies:  Allergies   Allergen Reactions   . Ciprofloxacin Rash   . Flagyl (Metronidazole Hcl) Rash   . Tetanus Toxoids Swelling       Medications:  Current Facility-Administered Medications on File Prior to Encounter   Medication Dose Route Frequency Provider Last Rate Last Dose   . [DISCONTINUED] lisinopril (PRINIVIL, ZESTRIL) tablet 40 mg  40 mg Oral Daily Andrey Cota, MD         Current Outpatient Prescriptions on File Prior to Encounter   Medication Sig Dispense Refill   . [DISCONTINUED] amitriptyline  (ELAVIL) 25 MG tablet Take 1 tablet by mouth As Directed. 1 pill qhs x 2 weeks, then as tolerated, increase to 2 pills qhs  60 tablet  0   . [DISCONTINUED] amLODIPINE (NORVASC) 10 MG tablet Take 1 tablet by mouth daily.  90 tablet  3   . atorvastatin (LIPITOR) 20 MG tablet Take 1 tablet by mouth daily.  90 tablet  1   . [DISCONTINUED] Calcium Carbonate Antacid (TUMS PO)        . carvedilol (COREG) 25 MG tablet Take 1 tablet by mouth 2 times daily.  180 tablet  3   . [DISCONTINUED] carvedilol (COREG) 25 MG tablet Take 1 tablet by mouth 2 times daily.  60 tablet  2   . [DISCONTINUED] clonidine (CATAPRES) 0.3 MG tablet Take 1 tablet by mouth 3 times daily.  180 tablet  3   . [DISCONTINUED] clonidine (CATAPRES) 0.3 MG tablet Take 1 tablet by mouth 3 times daily.  180 tablet  3   . dicyclomine (BENTYL) 10 MG capsule Take 1 capsule by mouth 3 times daily (before meals).  60 capsule  0   . docusate sodium (COLACE)  250 MG capsule Take 1 capsule by mouth 2 times daily.  60 capsule  0   . glipiZIDE (GLUCOTROL) 10 MG tablet Take 1 tablet by mouth 2 times daily. Take 1 tab in the morning and 1 tab before bedtime.  180 tablet  1   . lisinopril (PRINIVIL, ZESTRIL) 40 MG tablet Take 1 tablet by mouth 2 times daily.  180 tablet  3   . [DISCONTINUED] LORazepam (ATIVAN) 1 MG tablet Take 1 tablet by mouth every 8 hours as needed for Anxiety.  30 tablet  0   . [DISCONTINUED] metFORMIN (GLUCOPHAGE XR) 500 MG XR tablet Take 1 tablet by mouth daily.  30 tablet  3   . omeprazole (PRILOSEC) 20 MG capsule Take 20 mg by mouth daily.       . [DISCONTINUED] ondansetron (ZOFRAN) 4 MG tablet Take 1 tablet by mouth every 8 hours as needed for Nausea/Vomiting.  30 tablet  0   . [DISCONTINUED] ondansetron (ZOFRAN) 4 MG tablet Take 1 tablet by mouth every 8 hours as needed for Nausea/Vomiting.  15 tablet  0   . polyethylene glycol (GLYCOLAX) powder Take 17 g by mouth daily.  255 g  0   . [DISCONTINUED] senna (SENOKOT) 8.6 MG tablet Take 1 tablet by  mouth daily.  30 tablet  0   . senna (SENOKOT) 8.6 MG tablet Take 1 tablet by mouth daily.  30 tablet  1       Social History:  History     Social History   . Marital Status: Single     Spouse Name: N/A     Number of Children: 2   . Years of Education: N/A     Occupational History   . unemployed      Social History Main Topics   . Smoking status: Never Smoker    . Smokeless tobacco: Never Used   . Alcohol Use: No   . Drug Use: No      remote history of cocaine.  Currently occasional MJ smoking   . Sexually Active: Yes -- Female partner(s)      tubal ligation     Other Topics Concern   . Blood Transfusions No   . Special Diet No   . Exercise Yes     tries to walk     Social History Narrative    Two kids-adult in [place] (going thru med school [place]); son still deciding.    Born and raised in [place].    Moved to [facility] in august 2010-boyfriend works for [employer].       Family History:  Family History   Problem Relation Age of Onset   . Diabetes Father    . Lipids Father    . Hypertension Father    . Hypertension Mother        Review of Systems:  As per HPI, ROS otherwise negative    Physical Exam:  BP 139/90  Pulse 95  Temp(Src) 99.8 F (37.7 C)  Resp 19  Ht 5\' 4"  (1.626 m)  Wt 77.565 kg (171 lb)  BMI 29.34 kg/m2  SpO2 95%    GEN: alert, pleasant, in pain  Eyes: PERRL, EOMi  ENT: hearing grossly intact, no nasal discharge, OP clear without any lesions, right jaw with erythema/edema over parotid gland, very tender, + cervical LAD  CV: +s1, s2; no m/r/g; no peripheral edema; extremities warm and well perfused  Respiratory: lungs clear to auscultation bilaterally, no wheezes, rales or  rhonchi  GI: abdomen soft, nt, nd with + bs; no rebound/guarding  MSK: strength grossly intact  Skin: normal color, no lesions  Neurologic: cn II-XII grossly intact, no focal deficits  Psych: aaox3    Labs and Other Data:  Lab Results   Component Value Date    NA 131* 10/06/2012    K 4.2 10/06/2012    CL 92* 10/06/2012    BICARB  25 10/06/2012    BUN 7 10/06/2012    CREAT 0.38* 10/06/2012    GLU 272* 10/06/2012    [place] 9.9 10/06/2012     Lab Results   Component Value Date    WBC 13.8* 10/06/2012    HGB 14.5 10/06/2012    HCT 40.1 10/06/2012    PLT 204 10/06/2012    SEG 65 10/06/2012    LYMPHS 29 10/06/2012    MONOS 4* 10/06/2012    EOS 1 10/06/2012     No results found for this basename: INR, PTT     CT neck without contrast:  Right pre-auricular cellulitis with reactive inflammation of the right parotid  gland. No focal fluid collections. No periosteal reaction or erosion of   adjacent osseous structures.      Assessment and Care Plan:  47 year old female with NIDDM, HTN, HLD, ulcerative colitis and chronic back pain who is admitted with right pre-auricular cellulitis.    #SIRS/sepsis/cellulitis:  Meets sepsis criteria with tachycardia, leukocytosis and source being right pre-auricular cellulitis.  CT scan shows reactive inflammation of right parotid gland.  Possible source of entry for infection was flea bites the evening prior to onset of infection.  --admit to med-surg  --s/p vancomycin/clinda in ED  --cont abxs with vancomycin/zosyn given DM and possible polymicrobial infxn  --f/u fluid aspirate from PCP office today (pending in epic)  --blood culture (not obtained prior to abxs)  --ENT consult appreciated, awaiting final recs  --iv pain control    #AGMA:  AG 14, mild hyperglycemia, doubt DKA  --iv hydration  --check lactate, UA for ketones    #DM:  Poorly controlled, A1c in August 8.3, hyperglycemia 2/2 infection  --holding glipizide  --sliding scale insulin  --follow sugars, may need small dose of lantus if poor control with sliding scale    #HTN:   --cont amlodipine, coreg, clonidine    #Anxiety:  --valium prn    #PPX:  --lovenox      This plan and alternatives have been discussed with the patient and/or surrogate.    Code Status:  FULL CODE    The patient's primary care physician or clinic has been contacted regarding this admission.    Note  Author: Lawerance Sabal, 10/06/2012, 7:41 PM      .

## 2012-10-06 NOTE — ED Notes (Signed)
Admit team at bedside.

## 2012-10-06 NOTE — ED Notes (Signed)
Blood samples collected/labeled and sent to the lab via soft ID

## 2012-10-06 NOTE — ED Notes (Signed)
1rst set of blood cultures drawn from left hand. 2nd set of blood cultures and lactate drawn from right hand. Specimens labeled and sent to lab. Pt given sandwich meal tray.

## 2012-10-06 NOTE — Interdisciplinary (Signed)
Pt arrived to floor via gurney, able to transfer self to bed. Awake and pleasant, AOx4.

## 2012-10-06 NOTE — ED Notes (Signed)
Pt calm, reports pain relief following iv dilaudid. Awaiting CT. Pt informed of plan of care. Call light at bedside.

## 2012-10-06 NOTE — ED Attending Note (Signed)
ED ATTENDING NOTE:    See h & P

## 2012-10-06 NOTE — ED Floor Report (Signed)
Katherine Church is a 47 year old female    Chief Complaint   Patient presents with   . Ear Pain     Sent from clinic for eval of rt ear/jaw infection. Unstable bs, fevers/chills.       Admitted for There are no admission diagnoses documented for this encounter.    Code Status:  Please refer to In-pt admitting doctors orders     Past Medical History   Diagnosis Date   . Back pain    . High blood pressure    . Diabetes mellitus    . Hyperlipidemia LDL goal < 100    . Colitis, ulcerative        Past Surgical History   Procedure Laterality Date   . Pb anesth,tubal ligation/transection  1994   . Colonoscopy  04/2011     Repeat surveillance colo in 2019       Allergies: Ciprofloxacin; Flagyl; and Tetanus toxoids    Level of Care : MED/SURG    Fall Risk: no    Skin issues:  yes    >> If yes, note areas of skin breakdown. See appropriate photos.  R cheek ABSCESS, new, acute      Ambulatory:  yes    Sitter needed: no    Suicide Risk :  no    Isolation Required: no     >> If yes , what type of isolation:    Is patient in custody?  no    Is patient in restraints? no    Is patient on Heparin? no If yes, complete below:   Bolus=    Units      Units/hr   @   Mls/hour      Time of next PT/PTT draw:          Brief Summary of ED Visit (to include focused assessment and neuro status):  Pt is a 47 yo F, hx of HTN, DM, HLD, ulcerative colitis and back pain, and opoid dependence, presents to ED with R cheek absess, onset per pt on Saturday when taking care of rescued dogs.  She reports she may have became infected d/t the dogs/care of dogs and scratchted her R cheek with her hand and possibly creating the infection.  She denies hx of abscesses.  She also reported subjective F/C and severe pains, now noted reddened, swollen.  Has received IV abx in ED, blood cutlures sent off after ABX given.  Pain being managed with IV dilaudid and PO vicodin.  Pt is aaox4, continent of B/B, no other skin issues, ambulatory, aaox4, pleasant.               See  RN SHIFT ASSESSMENT ADULT  for full assessment, exceptions will be listed.    Cardiac rhythm:  NSR    Oxygen Delivery: None    Filed Vitals:    10/06/12 1708 10/06/12 1722 10/06/12 1802 10/06/12 2015   BP: 139/83 141/89 139/90 164/105   Pulse: 96 102 95 97   Temp:  99.8 F (37.7 C)  97.8 F (36.6 C)   Resp: 12 16 19 16    Height:       Weight:       SpO2: 95% 98% 95% 95%       Lab Results   Component Value Date    WBC 13.8* 10/06/2012    RBC 4.76 10/06/2012    HGB 14.5 10/06/2012    HCT 40.1 10/06/2012    MCV 84.2 10/06/2012  MCHC 36.2* 10/06/2012    RDW 12.7 10/06/2012    PLT 204 10/06/2012    MPV 10.2 10/06/2012       Lab Results   Component Value Date    NA 131* 10/06/2012    K 4.2 10/06/2012    CL 92* 10/06/2012    BICARB 25 10/06/2012    BUN 7 10/06/2012    CREAT 0.38* 10/06/2012    GLU 272* 10/06/2012    [place] 9.9 10/06/2012       No results found for this basename: BNP, PHOS, MG, LACTATE, AMMONIA, IONCA, ARTIONCA       No results found for this basename: CPK, CKMBH, TROPONIN       No results found for this basename: PH, PCO2, O2CONTENT, IVHC3, IVBE, O2SAT, UNPH, UNPCO2, ARTPH, ARTPCO2, ARTO2CNT, IAHC3, IABE, ARTO2SAT, UNAPH, UNAPCO2       No results found for this visit on 10/06/12.    RADIOLOGIC STUDIES NOT COMPLETED: none  (none unless otherwise noted)    Active PICC Line / CVC Line / PIV Line / Drain / Airway / Intraosseous Line / Epidural Line / ART Line / Line Type / Wound    Name: Placement date: Placement time: Site: Days:    Peripheral IV - 22 G Left Arm 10/06/12  1343  Arm  less than 1            Any significant events and interventions with responses:  None      Floor nurse informed that report is ready for review.  Opportunity to answer questions with floor RN face to face or by phone. ER number is [phone] . ER RN Mickie Bail to be contacted for any questions.    Has this report been updated?  No  By Who:   Time:   Additional information by updated user:

## 2012-10-06 NOTE — ED Notes (Signed)
Pt seen clinic earlier this am.

## 2012-10-06 NOTE — ED Notes (Signed)
Awaiting CT results. Pt informed of plan of care.

## 2012-10-06 NOTE — ED Notes (Signed)
Assumed care, First encounter. Pt coughing. Appears anxious, requesting pain medication

## 2012-10-06 NOTE — ED Notes (Signed)
Blood samples (2 sets of blood cultures, and LACTATE--mint green top on ice) collected from pt PIV/labeled and sent to the lab.

## 2012-10-06 NOTE — ED Notes (Signed)
Awaiting dispo. Pt informed of delays. Call light at bedside.

## 2012-10-06 NOTE — ED Notes (Signed)
Pt to CT dept with transporter. Stable for transport.

## 2012-10-06 NOTE — ED Notes (Signed)
eval per dr Paediatric nurse.

## 2012-10-06 NOTE — Consults (Cosign Needed)
[facility]  Otolaryngology- Head & Neck Surgery  Consult Note      Hospitalization:    0 days - Admitted on: 10/06/2012    Patient Name:  Katherine Church  MRN:    [id number]  Room#:   22/22  Attending:   Kathlynn Grate, *  Service:   ED Medicine    Requesting physician: Dr. Benna Dunks, Randol Kern, *    Reason for Consult: Right face swelling    History of Present Illness: Dawne Casali is a 47 year old female who has a past medical history of Back pain; High blood pressure; Diabetes mellitus; Hyperlipidemia LDL goal < 100; and Colitis, ulcerative.     Patient is a 47 year old female with past medical hx of diabetes, HTN and ulcers colitis presents with chief complaint of right jaw pain and swelling and intermittent subjective fevers for the last 3-4 days along with nausea. Patient was [facility] and had aspiration of the jaw swelling at the clinic prior to arrival and brought a culture tube with her. She notes that the swelling and pain started on Sunday and it might be a potential flea bite. The swelling and redness increased over time and today she went to the doctor to get it checks. She denies any tooth pain. No trouble breathing or swallowing. She notes her nose is a little congested. No bad taste in mouth. No bleeding/discharge from the swelling. It is tender to touch since last few days and she has been nauseous intermittently. No drooling though. She said over the last 3 days she cant open her mouth fully.     Hx of uncontrolled dm, ulcerative colitis and htn. NO surgeries in head and neck area.    Social History:  Tobacco: The patient reports that she has never smoked. She has never used smokeless tobacco.  Alcohol: The patient reports that she does not drink alcohol.  Drugs: The patient reports that she does not use illicit drugs.    Past Medical History   Diagnosis Date   . Back pain    . High blood pressure    . Diabetes mellitus    . Hyperlipidemia  LDL goal < 100    . Colitis, ulcerative         Past Surgical History   Procedure Laterality Date   . Pb anesth,tubal ligation/transection  1994   . Colonoscopy  04/2011     Repeat surveillance colo in 2019        Family History   Problem Relation Age of Onset   . Diabetes Father    . Lipids Father    . Hypertension Father    . Hypertension Mother         Allergies   Allergen Reactions   . Ciprofloxacin Rash   . Flagyl (Metronidazole Hcl) Rash   . Tetanus Toxoids Swelling       Current Facility-Administered Medications   Medication   . [DISCONTINUED] clindamycin (CLEOCIN) injection 600 mg   . clindamycin (CLEOCIN) IVPB 600 mg   . [COMPLETED] HYDROmorphone (DILAUDID) injection 1 mg   . [COMPLETED] HYDROmorphone (DILAUDID) injection 1 mg   . lisinopril (PRINIVIL, ZESTRIL) tablet 40 mg   . [COMPLETED] sodium chloride 0.9 % 500 mL IV bolus   . sodium chloride 0.9% infusion   . vancomycin (VANCOCIN) 1,500 mg in sodium chloride 0.9 % 250 mL IVPB     Current Outpatient Prescriptions   Medication  Sig   . amitriptyline (ELAVIL) 25 MG tablet Take 1 tablet by mouth As Directed. 1 pill qhs x 2 weeks, then as tolerated, increase to 2 pills qhs   . [DISCONTINUED] amLODIPINE (NORVASC) 10 MG tablet Take 1 tablet by mouth daily.   Marland Kitchen atorvastatin (LIPITOR) 20 MG tablet Take 1 tablet by mouth daily.   . [DISCONTINUED] Calcium Carbonate Antacid (TUMS PO)    . carvedilol (COREG) 25 MG tablet Take 1 tablet by mouth 2 times daily.   . [DISCONTINUED] carvedilol (COREG) 25 MG tablet Take 1 tablet by mouth 2 times daily.   . clonidine (CATAPRES) 0.3 MG tablet Take 1 tablet by mouth 3 times daily.   . [DISCONTINUED] clonidine (CATAPRES) 0.3 MG tablet Take 1 tablet by mouth 3 times daily.   Marland Kitchen dicyclomine (BENTYL) 10 MG capsule Take 1 capsule by mouth 3 times daily (before meals).   . docusate sodium (COLACE) 250 MG capsule Take 1 capsule by mouth 2 times daily.   Marland Kitchen glipiZIDE (GLUCOTROL) 10 MG tablet Take 1 tablet by mouth 2 times daily. Take 1  tab in the morning and 1 tab before bedtime.   Marland Kitchen lisinopril (PRINIVIL, ZESTRIL) 40 MG tablet Take 1 tablet by mouth 2 times daily.   Marland Kitchen LORazepam (ATIVAN) 1 MG tablet Take 1 tablet by mouth every 8 hours as needed for Anxiety.   . metFORMIN (GLUCOPHAGE XR) 500 MG XR tablet Take 1 tablet by mouth daily.   Marland Kitchen omeprazole (PRILOSEC) 20 MG capsule Take 20 mg by mouth daily.   . ondansetron (ZOFRAN) 4 MG tablet Take 1 tablet by mouth every 8 hours as needed for Nausea/Vomiting.   . ondansetron (ZOFRAN) 4 MG tablet Take 1 tablet by mouth every 8 hours as needed for Nausea/Vomiting.   . polyethylene glycol (GLYCOLAX) powder Take 17 g by mouth daily.   Marland Kitchen senna (SENOKOT) 8.6 MG tablet Take 1 tablet by mouth daily.   Marland Kitchen senna (SENOKOT) 8.6 MG tablet Take 1 tablet by mouth daily.        Lab Results   Component Value Date    WBC 13.8 10/06/2012    BAND 8 09/04/2009    HGB 14.5 10/06/2012    HCT 40.1 10/06/2012    PLT 204 10/06/2012    PLT 219 05/21/2009     No results found for this basename: INR, PTT     Lab Results   Component Value Date    NA 131 10/06/2012    K 4.2 10/06/2012    CL 92 10/06/2012    BICARB 25 10/06/2012    BUN 7 10/06/2012    CREAT 0.38 10/06/2012    GLU 272 10/06/2012    [place] 9.9 10/06/2012    MG 2.2 10/26/2011    PHOS 3.9 05/06/2011       Review of Systems:   GEN:   WNL   NEURO:  WNL   SKIN:   WNL  Musculoskeletal: WNL    CARD:   WNL  RESP:   WNL   GI:   WNL  GU:   WNL   PSY:   WNL   OPHTH:  WNL  HEENT:  Per HPI    Physical Exam:  BP 123/105  Pulse 114  Temp(Src) 97.8 F (36.6 C)  Resp 18  Ht 5\' 4"  (1.626 m)  Wt 77.565 kg (171 lb)  BMI 29.34 kg/m2  SpO2 98%   General: NAD, A&O, follows commands.    Neuro:  CN II-XII  grossly intact. PERRLA, EOMI. Face, tongue, and shrug are symmetric.   Ears:   Right ear tender to touch and pull. Left ear exam normal.  Nose:  external exam normal. No epistaxis or crusts.   Oral cavity/Oropharynx: Trismus +ve Mucus membranes normal. Dentition good. No evidence of masses or lesions. No  purulent discharge noted. Salivary expression on parotid massage. VEstibule, BOT, FOM soft.   Neck and Face: 5cm x 4cm erythematous tender swelling present on left angle of jaw and anterior to external ear with no definitive fluctuance but induration and erythema extending into upper neck. Parotid enlargement appreciated.    Laboratory and Radiology:   CT   FINDINGS:  Mild right pre-auricular subcutaneous soft tissue stranding without evidence   of a focal fluid collection. A tiny focus of gas is visualized (axial image   48, series 2) There is slightly increased attenuation of the right parotid   gland relative to the left parotid gland. Multiple prominent, subcentimeter   right cervical lymph nodes are noted but not pathologically enlarged by strict  CT size criteria.    There is no evidence of maxillofacial fracture. No evidence of periosteal   reaction. Paranasal sinuses, deep facial compartments, and orbital structures   are normal. The airway is patent.    The skull base and visualized intracranial compartments are unremarkable.    IMPRESSION:  Right pre-auricular cellulitis with reactive inflammation of the right parotid  gland. No focal fluid collections. No periosteal reaction or erosion of   adjacent osseous structures.           Assessment:  This is a 47 year old female with right pre-auricular cellulitis with no dainable abscess but reactive parotitis.    Recommendations:    - no I & D needed.  - Iv abx- vanc + zosyn/clinda  - warm compresses tid right face  - lemon drops q4h  - aggressive rehydration.      Thank you for involving [place] in the care of this patient. We will be available if you have further questions or concerns.    Patient seen and examined with Dr Dominga Ferry  Vella Raring, MD  PGY-1, Otolaryngology- Head & Neck Surgery  [facility] pager: [phone]

## 2012-10-06 NOTE — ED Notes (Signed)
Pt returned from CT. C/O puritis. Will inform ED MD.

## 2012-10-06 NOTE — ED Notes (Signed)
Consulted dr Mable Fill ent who will see pt and agrees with CT. Thanks    Kathlynn Grate, MD  10/06/12 1321

## 2012-10-06 NOTE — ED Notes (Signed)
Consult at bedside.

## 2012-10-06 NOTE — ED Notes (Signed)
Pt moved from t-2 to room #22 report to Casimiro Needle rn   Stable pain 8/10.

## 2012-10-07 LAB — CBC WITH DIFF, BLOOD
ANC-Automated: 5.7 10*3/uL (ref 1.6–7.0)
Abs Eosinophils: 0.2 10*3/uL (ref 0.0–0.5)
Abs Lymphs: 2.6 10*3/uL (ref 0.8–3.1)
Abs Monos: 0.4 10*3/uL (ref 0.2–0.8)
Eosinophils: 2 % (ref 1–7)
Hct: 38 % (ref 34.0–45.0)
Hgb: 13.3 gm/dL (ref 11.2–15.7)
Lymphocytes: 29 % (ref 19–53)
MCH: 30.1 pg (ref 26.0–32.0)
MCHC: 35 % (ref 32.0–36.0)
MCV: 86 um3 (ref 79.0–95.0)
MPV: 10 fL (ref 9.4–12.4)
Monocytes: 5 % (ref 5–12)
Plt Count: 196 10*3/uL (ref 140–370)
RBC: 4.42 10*6/uL (ref 3.90–5.20)
RDW: 13.1 % (ref 12.0–14.0)
Segs: 64 % (ref 34–71)
WBC: 9 10*3/uL (ref 4.0–10.0)

## 2012-10-07 LAB — BASIC METABOLIC PANEL, BLOOD
BUN: 6 mg/dL (ref 6–20)
Bicarbonate: 25 mmol/L (ref 22–29)
Calcium: 9.3 mg/dL (ref 8.6–10.0)
Chloride: 96 mmol/L — ABNORMAL LOW (ref 98–107)
Creatinine: 0.39 mg/dL — ABNORMAL LOW (ref 0.51–0.95)
GFR: >60 mL/min
Glucose: 315 mg/dL — ABNORMAL HIGH (ref 70–115)
Potassium: 4 mmol/L (ref 3.5–5.1)
Sodium: 134 mmol/L — ABNORMAL LOW (ref 136–145)

## 2012-10-07 LAB — PHOSPHORUS, BLOOD: Phosphorous: 3.6 mg/dL (ref 2.7–4.5)

## 2012-10-07 LAB — MAGNESIUM, BLOOD: Magnesium: 2 mg/dL (ref 1.7–2.6)

## 2012-10-07 LAB — GLYCOSYLATED HGB(A1C), BLOOD: Glyco Hgb (A1C): 10.6 % — ABNORMAL HIGH (ref 4.8–5.9)

## 2012-10-07 MED ORDER — HYDROMORPHONE HCL 1 MG/ML IJ SOLN
1.00 mg | Freq: Once | INTRAMUSCULAR | Status: AC
Start: 2012-10-07 — End: 2012-10-07
  Administered 2012-10-07: 1 mg via INTRAVENOUS
  Filled 2012-10-07: qty 1

## 2012-10-07 MED ORDER — GABAPENTIN 300 MG OR CAPS
300.0000 mg | ORAL_CAPSULE | Freq: Every evening | ORAL | Status: DC
Start: 2012-10-07 — End: 2012-10-09
  Administered 2012-10-07 – 2012-10-08 (×2): 300 mg via ORAL
  Filled 2012-10-07 (×2): qty 1

## 2012-10-07 MED ORDER — ENOXAPARIN SODIUM 30 MG/0.3ML SC SOLN
30.0000 mg | Freq: Every day | SUBCUTANEOUS | Status: DC
Start: 2012-10-08 — End: 2012-10-07

## 2012-10-07 MED ORDER — CLINDAMYCIN PHOSPHATE IN D5W 600 MG/50ML IV SOLN
600.0000 mg | Freq: Three times a day (TID) | INTRAVENOUS | Status: DC
Start: 2012-10-07 — End: 2012-10-07
  Administered 2012-10-07: 600 mg via INTRAVENOUS
  Filled 2012-10-07: qty 50

## 2012-10-07 MED ORDER — ONDANSETRON HCL 4 MG/2ML IV SOLN
4.0000 mg | Freq: Four times a day (QID) | INTRAMUSCULAR | Status: DC | PRN
Start: 2012-10-07 — End: 2012-10-07

## 2012-10-07 MED ORDER — DIPHENHYDRAMINE HCL 25 MG OR TABS OR CAPS CUSTOM
25.00 mg | ORAL_CAPSULE | Freq: Once | ORAL | Status: AC
Start: 2012-10-07 — End: 2012-10-07
  Administered 2012-10-07: 25 mg via ORAL
  Filled 2012-10-07: qty 1

## 2012-10-07 MED ORDER — DIPHENHYDRAMINE HCL 50 MG/ML IJ SOLN
25.0000 mg | INTRAMUSCULAR | Status: DC | PRN
Start: 2012-10-07 — End: 2012-10-08
  Administered 2012-10-07 – 2012-10-08 (×3): 25 mg via INTRAVENOUS
  Filled 2012-10-07 (×3): qty 50

## 2012-10-07 MED ORDER — ZOLPIDEM TARTRATE 5 MG OR TABS
5.0000 mg | ORAL_TABLET | Freq: Every evening | ORAL | Status: DC | PRN
Start: 2012-10-07 — End: 2012-10-11
  Administered 2012-10-07 – 2012-10-10 (×4): 5 mg via ORAL
  Filled 2012-10-07 (×4): qty 1

## 2012-10-07 MED ORDER — ENOXAPARIN SODIUM 40 MG/0.4ML SC SOLN
40.0000 mg | Freq: Every day | SUBCUTANEOUS | Status: DC
Start: 2012-10-08 — End: 2012-10-11
  Administered 2012-10-08 – 2012-10-11 (×4): 40 mg via SUBCUTANEOUS
  Filled 2012-10-07 (×4): qty 0.4

## 2012-10-07 MED ORDER — INSULIN GLARGINE 100 UNIT/ML SC SOLN
0.1500 [IU]/kg | Freq: Every day | SUBCUTANEOUS | Status: DC
Start: 2012-10-07 — End: 2012-10-08
  Administered 2012-10-07: 12 [IU] via SUBCUTANEOUS
  Filled 2012-10-07: qty 12

## 2012-10-07 MED ORDER — HYDROMORPHONE PCA 0.2 MG/ML SYRINGE
INTRAMUSCULAR | Status: DC
Start: 2012-10-07 — End: 2012-10-08
  Filled 2012-10-07: qty 50

## 2012-10-07 MED ORDER — NALOXONE HCL 0.4 MG/ML IJ SOLN
0.1000 mg | INTRAMUSCULAR | Status: DC | PRN
Start: 2012-10-07 — End: 2012-10-08

## 2012-10-07 NOTE — Plan of Care (Signed)
Dr Jackolyn Confer called back aware of FS result .

## 2012-10-07 NOTE — Progress Notes (Signed)
[facility]  Otolaryngology- Head & Neck Surgery  Progress Note      Hospitalization:    1 day - Admitted on: 10/06/2012    Patient Name:  Katherine Church  MRN:    [id number]  Room#:   22/22  Attending:   Sedalia Muta, MD  Service:   ED Medicine      History of Present Illness: Takeesha Isley is a 47 year old female who has a past medical history of Back pain; High blood pressure; Diabetes mellitus; Hyperlipidemia LDL goal < 100; and Colitis, ulcerative.     Patient is a 47 year old female with past medical hx of diabetes, HTN and ulcers colitis presents with chief complaint of right jaw pain and swelling and intermittent subjective fevers for the last 3-4 days along with nausea. Patient was [facility] [place] [facility] and had aspiration of the jaw swelling at the clinic prior to arrival and brought a culture tube with her. She notes that the swelling and pain started on Sunday and it might be a potential flea bite. The swelling and redness increased over time and today she went to the doctor to get it checks. She denies any tooth pain. No trouble breathing or swallowing. She notes her nose is a little congested. No bad taste in mouth. No bleeding/discharge from the swelling. It is tender to touch since last few days and she has been nauseous intermittently. No drooling though. She said over the last 3 days she cant open her mouth fully.     Hx of uncontrolled dm, ulcerative colitis and htn. NO surgeries in head and neck area.    24hr events: breathing better, pain controlled, no more headache.    Social History:  Tobacco: The patient reports that she has never smoked. She has never used smokeless tobacco.  Alcohol: The patient reports that she does not drink alcohol.  Drugs: The patient reports that she does not use illicit drugs.    Past Medical History   Diagnosis Date   . Back pain    . High blood pressure    . Diabetes mellitus    . Hyperlipidemia LDL goal < 100    . Colitis,  ulcerative         Past Surgical History   Procedure Laterality Date   . Pb anesth,tubal ligation/transection  1994   . Colonoscopy  04/2011     Repeat surveillance colo in 2019        Family History   Problem Relation Age of Onset   . Diabetes Father    . Lipids Father    . Hypertension Father    . Hypertension Mother         Allergies   Allergen Reactions   . Ciprofloxacin Rash   . Flagyl (Metronidazole Hcl) Rash   . Tetanus Toxoids Swelling       Current Facility-Administered Medications   Medication   . amLODIPINE (NORVASC) tablet 10 mg   . atorvastatin (LIPITOR) tablet 20 mg   . carvedilol (COREG) tablet 25 mg   . [DISCONTINUED] clindamycin (CLEOCIN) injection 600 mg   . clindamycin (CLEOCIN) IVPB 600 mg   . [COMPLETED] clindamycin (CLEOCIN) IVPB 600 mg   . clonidine (CATAPRES) tablet 0.3 mg   . dextrose 50 % solution 12.5 g   . diazepam (VALIUM) tablet 5 mg   . [COMPLETED] diphenhydrAMINE (BENADRYL) injection 25 mg   . [COMPLETED] diphenhydrAMINE (BENADRYL) tablet 25 mg   .  docusate sodium (COLACE) capsule 250 mg   . enoxaparin (LOVENOX) injection 30 mg   . glucagon (GLUCAGON) injection 1 mg   . glucose 40% oral gel 1 Tube   . glucose chewable tablet 16 g   . [COMPLETED] HYDROcodone-acetaminophen (NORCO) 5-325 MG tablet 1 tablet   . HYDROmorphone (DILAUDID) injection 0.5 mg   . [COMPLETED] HYDROmorphone (DILAUDID) injection 1 mg   . [COMPLETED] HYDROmorphone (DILAUDID) injection 1 mg   . [COMPLETED] HYDROmorphone (DILAUDID) injection 1 mg   . [COMPLETED] HYDROmorphone (DILAUDID) injection 1 mg   . [EXPIRED] HYDROmorphone (DILAUDID) injection 1 mg   . HYDROmorphone (DILAUDID) injection 1 mg   . insulin lispro (HUMALOG) injection 1-10 Units   . [COMPLETED] insulin regular (HUMULIN,NOVOLIN) injection 4 Units   . lansoprazole (PREVACID) DR capsule 15 mg   . lisinopril (PRINIVIL, ZESTRIL) tablet 40 mg   . [COMPLETED] ondansetron (ZOFRAN) injection 4 mg   . [COMPLETED] ondansetron (ZOFRAN) injection 4 mg   .  [EXPIRED] ondansetron (ZOFRAN) injection 4 mg   . ondansetron (ZOFRAN) injection 4 mg   . piperacillin-tazobactam (ZOSYN) 3,375 mg in sodium chloride 0.9 % 50 mL IVPB   . sodium chloride (PF) 0.9 % flush 3 mL   . sodium chloride (PF) 0.9 % flush 3 mL   . [COMPLETED] sodium chloride 0.9 % 500 mL IV bolus   . [DISCONTINUED] sodium chloride 0.9% infusion   . sodium chloride 0.9% infusion   . [COMPLETED] traZODone (DESYREL) tablet 50 mg   . [DISCONTINUED] vancomycin (VANCOCIN) 1,000 mg in sodium chloride 0.9 % 250 mL IVPB   . vancomycin (VANCOCIN) 1,000 mg in sodium chloride 0.9 % 250 mL IVPB   . [COMPLETED] vancomycin (VANCOCIN) 1,500 mg in sodium chloride 0.9 % 250 mL IVPB        Lab Results   Component Value Date    WBC 13.8 10/06/2012    BAND 8 09/04/2009    HGB 14.5 10/06/2012    HCT 40.1 10/06/2012    PLT 204 10/06/2012    PLT 219 05/21/2009     No results found for this basename: INR,  PTT     Lab Results   Component Value Date    NA 131 10/06/2012    K 4.2 10/06/2012    CL 92 10/06/2012    BICARB 25 10/06/2012    BUN 7 10/06/2012    CREAT 0.38 10/06/2012    GLU 272 10/06/2012    [place] 9.9 10/06/2012    MG 2.2 10/26/2011    PHOS 3.9 05/06/2011       Review of Systems:   GEN:   WNL   NEURO:  WNL   SKIN:   WNL  Musculoskeletal: WNL    CARD:   WNL  RESP:   WNL   GI:   WNL  GU:   WNL   PSY:   WNL   OPHTH:  WNL  HEENT:  Per HPI    Physical Exam:  BP 128/94  Pulse 82  Temp(Src) 97.5 F (36.4 C)  Resp 17  Ht 5\' 4"  (1.626 m)  Wt 77.565 kg (171 lb)  BMI 29.34 kg/m2  SpO2 96%   General: NAD, A&O, follows commands.    Neuro:  CN II-XII grossly intact. PERRLA, EOMI. Face, tongue, and shrug are symmetric.   Ears:   Right ear tender to touch and pull. Left ear exam normal.  Nose:  external exam normal. No epistaxis or crusts.   Oral cavity/Oropharynx: Trismus +  ve Mucus membranes normal. Dentition good. No evidence of masses or lesions. No purulent discharge noted. Salivary expression on parotid massage. VEstibule, BOT, FOM soft.   Neck  and Face: 5cm x 4cm erythematous tender swelling present on left angle of jaw and anterior to external ear with no definitive fluctuance but induration and erythema extending into upper neck. Parotid enlargement appreciated.    Laboratory and Radiology:   CT   FINDINGS:  Mild right pre-auricular subcutaneous soft tissue stranding without evidence   of a focal fluid collection. A tiny focus of gas is visualized (axial image   48, series 2) There is slightly increased attenuation of the right parotid   gland relative to the left parotid gland. Multiple prominent, subcentimeter   right cervical lymph nodes are noted but not pathologically enlarged by strict  CT size criteria.    There is no evidence of maxillofacial fracture. No evidence of periosteal   reaction. Paranasal sinuses, deep facial compartments, and orbital structures   are normal. The airway is patent.    The skull base and visualized intracranial compartments are unremarkable.    IMPRESSION:  Right pre-auricular cellulitis with reactive inflammation of the right parotid  gland. No focal fluid collections. No periosteal reaction or erosion of   adjacent osseous structures.           Assessment:  This is a 47 year old female with right pre-auricular cellulitis with no dainable abscess but reactive parotitis.    Recommendations:    - no I & D needed.  - Iv abx- vanc + zosyn/clinda  - warm compresses tid right face  - lemon drops q4h  - aggressive rehydration.    WILL ALSO RECOMMEND STRICT BLOOD SUGAR CONTROL      Thank you for involving [place] in the care of this patient. We will be available if you have further questions or concerns.    Patient seen and examined with Dr Dominga Ferry  Vella Raring, MD  PGY-1, Otolaryngology- Head & Neck Surgery  [facility] pager: [phone]

## 2012-10-07 NOTE — Plan of Care (Signed)
re: Katherine Church 63B  - her FS this am is 331 .

## 2012-10-07 NOTE — Plan of Care (Signed)
Text message Dr Jackolyn Confer - Mrs Ugarte C/O shoulder joint pain - she is asking if this is side effect of her IV antibiotics .

## 2012-10-07 NOTE — Interdisciplinary (Signed)
10/07/12 1638   Patient Information   Why is Patient in the Hospital? 47 year old female with right pre-auricular cellulitis with no dainable abscess but reactive parotitis   Prior to Level of Function Ambulatory/Independent with ADL's   Assistive Device Not applicable   Income Information   Income Source Employed  (dog groomer)   Referral To   Architect (no medical insurance)   Discharge Planning   Living Arrangements Spouse / significant other  (lives with Thayer Ohm her SO [phone])   Type of Residence Private residence  (lives in a 3rd floor apt, has an Engineer, structural)   Home Care Services Yes  (previously had HH doesnt remember which one)   Patient expects to be discharged to: Home   Do you have transportation home?  Yes   47 year old female with past medical hx of diabetes, HTN and ulcers colitis presents with chief complaint of right jaw pain and swelling and intermittent subjective fevers for the last 3-4 days along with nausea. Patient was [place] [facility] and had aspiration of the jaw swelling at the clinic prior to arrival and brought a culture tube with her. She notes that the swelling and pain started on Sunday and it might be a potential flea bite.  Social HX: lives with SO Chris [phone] in a 3rd floor apt, no use of DME, previously had HH RN but doesn't remember which agency, and has never been placed in a SNF. Dr Roger Shelter is her PCP and she has her prescriptions filled at [facility] on [address]. She is currently without medical insurance but is currently pursuing funding. Patient is not a well controlled diabetic and states because of her lack of funding hasnt been able to fill her meds. CM will continue to follow as patient's needs change, currently on PCA for pain and IV vanco and zosyn. Michaela Corner RN

## 2012-10-07 NOTE — Plan of Care (Signed)
Text message Dr Jackolyn Confer  - Mrs Katherine Church - Her BP is elevated 160/110 I gave all her BP meds already latest bp 149/104

## 2012-10-07 NOTE — Plan of Care (Signed)
Text message Dr Jackolyn Confer Elliot Gurney [phone] - FS 352 - I will give her insulin now

## 2012-10-07 NOTE — Plan of Care (Signed)
Nursing student assessment & care plan checked & cosigned .

## 2012-10-07 NOTE — Plan of Care (Signed)
Problem: Pain - Acute  Goal: Control of acute pain  Pt able to adequately communicate pain level using verbal 0-10 pain scale. Educated pt on pharm pain management options, pt verbalized understanding. Encouraged pt to request pain meds prior to pain becoming severe, pt verbalized understanding. Dilaudid administered every 4 hours PRN. Non-pharm interventions include warm compress and distraction.         Problem: Infection  Goal: Absence of infection signs and symptoms  Outcome: Not Met  VS stable, skin warm and dry, pt denies chills. Pt with low grade fever of 99.8. IV Vanco and Zosyn administered per MD order. R side of face/jaw is swollen, red, and tender to the touch. WBC on 2/26 = 13.8. Encouraged good hand hygiene.     Problem: Alteration in Blood Glucose  Goal: Glucose level within specified parameters  Outcome: Not Met  Pt's blood glucose at 2224 = 289, 2 units of Humalog administered, per sliding scale. No signs or symptoms of hyper/hypoglycemia noted throughout shift.     Problem: Falls - Risk of  Goal: Absence of falls  Outcome: Met  Performed hourly rounds, side rails up x 2, call light and bedside table within reach, ambulates with non-skid socks, bed locked and in lowest position, room free from clutter. Instructed pt to use call light for assistance.         Problem: Discharge Planning  Goal: Participation in care planning  Outcome: Met  Pt involved in POC regarding pain management, antibiotic therapy, activity, diet, and safety. POC is ongoing.

## 2012-10-07 NOTE — Plan of Care (Signed)
Problem: Pain - Acute  Goal: Control of acute pain  Outcome: Met  Patient used a 0-10 verbal scale to communicate pain. Patient was educated verbalize pain before it came to severe. She is on a hydromorphone PCA pump to control pain. Used warm compresses and distraction to alleviate pain.     Problem: Infection  Goal: Absence of infection signs and symptoms  Outcome: Met  VS are stable, WBC= 9.0 on 10/07/12. Administered Clindamycin, Zosyn, and Vancomycin during shift. Right side of face is red and swollen with no signs of growing larger.     Problem: Alteration in Blood Glucose  Goal: Glucose level within specified parameters  Outcome: Not Met  Blood sugar controlled using the sliding scale. BS was 331 at 0800 and 296 at 1300, given 3 units insulin. No signs or symptoms of of hypo/hyperglycemia.    Problem: Falls - Risk of  Goal: Absence of falls  Outcome: Met  Hourly safety checks were preformed, bed in lowest position, side rails up X2, call light in reach, non skid socks on. Patient verbalized understanding to use call light for assistance.

## 2012-10-07 NOTE — H&P (Signed)
DAILY PROGRESS NOTE      Current Hospital LOS: 1 day     Subjective/Interval History:  Admitted last night for right sided preauricular cellulitis and parotiditis.Main complaint remains pain that is throbbing,at times radiating down the jaw, and only partially controlled with prn meds.    Objective:    Vital Signs:   Temperature:  [88.4 F (31.3 C)-100 F (37.8 C)] 97.8 F (36.6 C) (02/27 1459)  Blood pressure (BP): (128-164)/(83-110) 128/89 mmHg (02/27 1459)  Heart Rate:  [68-102] 89 (02/27 1459)  Respirations:  [12-19] 16 (02/27 1459)  Pain Score:  [-] 7 (02/27 1459)  O2 Device:  [-] None (Room air) (02/27 0748)  SpO2:  [94 %-98 %] 97 % (02/27 1459)    Physical Exam:  GEN: alert, pleasant, in pain   Eyes: PERRL, EOMi   ENT: hearing grossly intact, no nasal discharge, OP clear without any lesions, right jaw with erythema/edema over parotid gland, very tender, + cervical LAD   CV: +s1, s2; no m/r/g; no peripheral edema; extremities warm and well perfused   Respiratory: lungs clear to auscultation bilaterally, no wheezes, rales or rhonchi   GI: abdomen soft, nt, nd with + bs; no rebound/guarding   MSK: strength grossly intact   Skin: normal color, no lesions   Neurologic: cn II-XII grossly intact, no focal deficits      Laboratory data:   Lab Results   Component Value Date    NA 134* 10/07/2012    K 4.0 10/07/2012    CL 96* 10/07/2012    BICARB 25 10/07/2012    BUN 6 10/07/2012    CREAT 0.39* 10/07/2012    GLU 315* 10/07/2012    [place] 9.3 10/07/2012     Lab Results   Component Value Date    WBC 9.0 10/07/2012    HGB 13.3 10/07/2012    HCT 38.0 10/07/2012    PLT 196 10/07/2012    SEG 64 10/07/2012    LYMPHS 29 10/07/2012    MONOS 5 10/07/2012    EOS 2 10/07/2012     WBC 13.8 on admission    CT Right pre-auricular cellulitis with reactive inflammation of the right parotid gland. No focal fluid collections. No periosteal reaction or erosion of adjacent osseous structures.      Assessment and Plan:    Cellulitis of face and  parotiditis  Appreciate ENT evaluation and follow up. Continue iv abx,broad spectrum.  Continue to clinically monitor for abscess  PCA for pain control until more stable.    DM.  Uncontrolled,A1c up to 10.6 from 8 in August 2013.  On oral meds at home. Will need Lantus and short acting here and possibly even at home    HTN  Will monitor on home meds.    DVT prevention  Lovenox

## 2012-10-08 LAB — MRSA SURVEILLANCE CULTURE

## 2012-10-08 LAB — VANCOMYCIN, TROUGH: Vancomycin, Trough: 19.4 ug/mL — ABNORMAL HIGH (ref 5.0–10.0)

## 2012-10-08 MED ORDER — HYDROMORPHONE HCL 1 MG/ML IJ SOLN
0.5000 mg | INTRAMUSCULAR | Status: DC | PRN
Start: 2012-10-08 — End: 2012-10-10
  Administered 2012-10-08 – 2012-10-10 (×7): 0.5 mg via INTRAVENOUS
  Filled 2012-10-08 (×7): qty 1

## 2012-10-08 MED ORDER — HYDROMORPHONE HCL 1 MG/ML IJ SOLN
0.5000 mg | INTRAMUSCULAR | Status: DC | PRN
Start: 2012-10-08 — End: 2012-10-08
  Administered 2012-10-08: 0.5 mg via INTRAVENOUS
  Filled 2012-10-08: qty 1

## 2012-10-08 MED ORDER — HYDROCODONE-ACETAMINOPHEN 5-325 MG OR TABS
1.0000 | ORAL_TABLET | ORAL | Status: DC | PRN
Start: 2012-10-08 — End: 2012-10-08
  Administered 2012-10-08 (×2): 1 via ORAL
  Filled 2012-10-08 (×2): qty 1

## 2012-10-08 MED ORDER — INSULIN LISPRO (HUMAN) 100 UNIT/ML SC SOLN (CUSTOM)
6.0000 [IU] | Freq: Three times a day (TID) | INTRAMUSCULAR | Status: DC
Start: 2012-10-08 — End: 2012-10-09
  Administered 2012-10-08 – 2012-10-09 (×3): 6 [IU] via SUBCUTANEOUS
  Filled 2012-10-08 (×2): qty 6

## 2012-10-08 MED ORDER — HYDROMORPHONE HCL 1 MG/ML IJ SOLN
0.50 mg | Freq: Once | INTRAMUSCULAR | Status: AC
Start: 2012-10-08 — End: 2012-10-08
  Administered 2012-10-08: 0.5 mg via INTRAVENOUS
  Filled 2012-10-08: qty 1

## 2012-10-08 MED ORDER — HYDROCODONE-ACETAMINOPHEN 5-325 MG OR TABS
2.0000 | ORAL_TABLET | ORAL | Status: DC | PRN
Start: 2012-10-08 — End: 2012-10-09
  Administered 2012-10-08 – 2012-10-09 (×2): 2 via ORAL
  Filled 2012-10-08 (×2): qty 2

## 2012-10-08 MED ORDER — SODIUM CHLORIDE 0.9 % IV SOLN
1000.0000 mg | Freq: Three times a day (TID) | INTRAVENOUS | Status: DC
Start: 2012-10-08 — End: 2012-10-09
  Administered 2012-10-08 – 2012-10-09 (×3): 1000 mg via INTRAVENOUS
  Filled 2012-10-08 (×3): qty 1000

## 2012-10-08 MED ORDER — INSULIN GLARGINE 100 UNIT/ML SC SOLN
19.0000 [IU] | Freq: Every day | SUBCUTANEOUS | Status: DC
Start: 2012-10-08 — End: 2012-10-09
  Administered 2012-10-08: 19 [IU] via SUBCUTANEOUS
  Filled 2012-10-08: qty 19

## 2012-10-08 MED ORDER — INSULIN LISPRO (HUMAN) 100 UNIT/ML SC SOLN (CUSTOM)
2.0000 [IU] | Freq: Three times a day (TID) | INTRAMUSCULAR | Status: DC
Start: 2012-10-08 — End: 2012-10-08
  Administered 2012-10-08: 09:00:00 2 [IU] via SUBCUTANEOUS
  Filled 2012-10-08: qty 2

## 2012-10-08 MED ORDER — DIPHENHYDRAMINE HCL 25 MG OR TABS OR CAPS CUSTOM
25.00 mg | ORAL_CAPSULE | Freq: Once | ORAL | Status: AC
Start: 2012-10-08 — End: 2012-10-08
  Administered 2012-10-08: 25 mg via ORAL
  Filled 2012-10-08: qty 1

## 2012-10-08 NOTE — Progress Notes (Signed)
Pharmacokinetics Note - Vancomycin    Indication: Cellulitis  Current Regimen: Vancomycin 1000 mg Q 8 hr.  Treatment day #3.  Target Level: 10-15 mg/L    Current Levels & Pertinent Labs  IP Vancomycin Latest Ref Rng 10/08/2012 10/07/2012 10/06/2012   Vanco Trough 5.0 - 10.0 mcg/mL 19.4(H) - -   GFR Non-AA - >60 >60 >60   sCr 0.51 - 0.95 mg/dL 1.61(W) 9.60(A) 5.40(J)   WBC 4.0 - 10.0 1000/mm3 7.5 9.0 13.8(H)   CRP <0.5 mg/dL - - 14.3(H)   ESR 0 - 20 mm/hr - - 46(H)       Pharmacokinetics Parameters on Current Regimen  Vd: 44.6 L  Cl: 6.1 L/hr  t1/2: 5 hr  Extrapolated peak (1-hr post) at steady-state (Css pk): 22 mg/L  Extrapolated trough at steady-state (Css tr): 10 mg/L    Assessment/Plan:  - Based on these results continue with the current dose of vancomycin 1000mg  IV Q8hrs with predicted peak and trough of 22mg /L and 10mg /L, respectively.  - Vancomycin trough will be drawn on 3/3 if vancomycin will be continued, continue to monitor sCr daily.     Pharmacy will continue to follow and make adjustments/recommendations to therapy as needed.      Gabriel Rainwater,  Pharmacy Student

## 2012-10-08 NOTE — H&P (Signed)
DAILY PROGRESS NOTE      Current Hospital LOS: 2 days     Subjective/Interval History:  Patient interviewed and examined. Participated for key portions of history and exam with resident team. Reviewed and agree with resident notes.  Feels better,pain well controlled with PCA.    Objective:    Vital Signs:   Temperature:  [97.8 F (36.6 C)-98.5 F (36.9 C)] 97.8 F (36.6 C) (02/28 0834)  Blood pressure (BP): (101-160)/(75-110) 101/79 mmHg (02/28 0841)  Heart Rate:  [77-92] 77 (02/28 0841)  Respirations:  [16-18] 18 (02/28 0834)  Pain Score:  [-] 7 (02/28 1002)  O2 Device:  [-] None (Room air) (02/28 0226)  SpO2:  [97 %-99 %] 99 % (02/28 0834)    Physical Exam:  GEN: alert, pleasant, in pain   Eyes: PERRL, EOMi   ENT: hearing grossly intact, no nasal discharge, OP clear without any lesions, right jaw with erythema/edema over parotid gland, very tender, + cervical LAD   CV: +s1, s2; no m/r/g; no peripheral edema; extremities warm and well perfused   Respiratory: lungs clear to auscultation bilaterally, no wheezes, rales or rhonchi   GI: abdomen soft, nt, nd with + bs; no rebound/guarding   MSK: strength grossly intact   Skin: normal color, no lesions   Neurologic: cn II-XII grossly intact, no focal deficits      Laboratory data:   Lab Results   Component Value Date    NA 131* 10/08/2012    K 4.1 10/08/2012    CL 94* 10/08/2012    BICARB 23 10/08/2012    BUN 10 10/08/2012    CREAT 0.40* 10/08/2012    GLU 400* 10/08/2012    [place] 9.5 10/08/2012     Lab Results   Component Value Date    WBC 7.5 10/08/2012    HGB 12.5 10/08/2012    HCT 36.7 10/08/2012    PLT 203 10/08/2012    SEG 63 10/08/2012    LYMPHS 29 10/08/2012    MONOS 5 10/08/2012    EOS 3 10/08/2012     WBC 13.8 on admission    CT Right pre-auricular cellulitis with reactive inflammation of the right parotid gland. No focal fluid collections. No periosteal reaction or erosion of adjacent osseous structures.    Culture of aspirate Staph aureus    Assessment and Plan:    Cellulitis  of face and parotiditis  Overall improved,culture growing Staph aureus,sensitivities pending.  Probability of GNR was pretty low initially and seems even lower with current culture result.  Continue Vancomycin until sensitivities available.  Transitioning to po pain meds.    DM.  Uncontrolled,A1c up to 10.6 from 8 in August 2013.  On oral meds at home but admits poor control. Will need increased doses of Lantus and short acting here and possibly even at home. Diabetes service to see today    HTN  Will monitor on home meds,control improved once pain better controlled.    DVT prevention  Lovenox

## 2012-10-08 NOTE — Plan of Care (Signed)
Problem: Pain - Acute  Goal: Control of acute pain  Outcome: Not Met  Patient still C/O R jaw pain especially when she is eating , chewing , PCA Dilaudid discontinued , started on Norco & IV Dilaudid 0.5 mg , warm pack to R face . Continue to monitor pain .    Problem: Infection  Goal: Absence of infection signs and symptoms  Outcome: Not Met  R face still swollen & red , Vanco & Zosyn dc'd  - Started on IV Ancef .    Problem: Alteration in Blood Glucose  Goal: Glucose level within specified parameters  Outcome: Not Met  FS running high  - MD aware 378 , 329 - insulin sq given , DM teaching started by RN Garritt Molyneux & [place] the Diabetic nurse . EMMI schedule RE: Diabetic education given copy to patient .    Problem: Falls - Risk of  Goal: Absence of falls  Outcome: Met  Discuss importance of fall prevention , use call light at bedside , do not attempt to get out of bed with out assistance , hourly rounds .  Patient ambulatory gait steady . No fall .

## 2012-10-08 NOTE — Progress Notes (Signed)
Daily Progress Note:  10/08/2012     Current Hospital Stay:   2 days - Admitted on: 10/06/2012    Subjective:  No acute events ON. Patient endorsed adequate pain control with PCA. No fevers,chills,night sweats, or purulent drainage.     Objective:  Vital Signs:  Temperature:  [97.8 F (36.6 C)-98.8 F (37.1 C)] 98.8 F (37.1 C) (02/28 2022)  Blood pressure (BP): (101-161)/(75-111) 140/84 mmHg (02/28 2022)  Heart Rate:  [77-90] 78 (02/28 2022)  Respirations:  [18] 18 (02/28 2022)  Pain Score:  [-] 8 (02/28 2022)  O2 Device:  [-] None (Room air) (02/28 2022)  SpO2:  [95 %-99 %] 95 % (02/28 2022)    Wt Readings from Last 1 Encounters:   10/06/12 77.565 kg (171 lb)       Intake/Output (Current Shift):  02/28 1800 - 03/01 0559  In: 240 [P.O.:240]  Out: -       Physical Exam:  General Appearance: healthy, alert, no distress, pleasant affect, cooperative.  Eyes:  PERRLA  Mouth: MMM. R parotid swelling with some desquamation  Neck: R submandibular LAD  Heart: RRR  Lungs: CTAB  Abdomen: S,ND,NT,NBS  Extremities:  WWP without edema     Laboratory data:   Lab Results   Component Value Date    NA 131* 10/08/2012    K 4.1 10/08/2012    CL 94* 10/08/2012    BICARB 23 10/08/2012    BUN 10 10/08/2012    CREAT 0.40* 10/08/2012    GLU 400* 10/08/2012    [place] 9.5 10/08/2012     Lab Results   Component Value Date    WBC 7.5 10/08/2012    HGB 12.5 10/08/2012    HCT 36.7 10/08/2012    PLT 203 10/08/2012    SEG 63 10/08/2012    LYMPHS 29 10/08/2012    MONOS 5 10/08/2012    EOS 3 10/08/2012       Assessment and Plan:  Patient is a 47 year female with poorly controlled DMT2,[place],and HTN who presents with R jaw swelling and subjective fevers of three day onset.     #R Facial Cellulitis/Parotitis   -Interval improvement with vanc/zosyn  -Switched with to cefazolin based on wound sensitivities; will consider progressing to PO regimen if stable tomorrow  -Transitioned to PO norco with IV dilaudid for breakthrough     #DM.   -Poorly controlled A1c (up to 10.6  from 8) in August 2013.   -Appreciate diabetes education assistance;currently glargine 19 QHS and 6 QAC + SSI  -BG still poorly controlled in 300's.     #HTN   -Will continue home meds  -Isolated elevations in the setting of sympathetic pain response     #Gen  -Carb limited diet   -Lovenox for VTE ppx  -FC/FC  -Dispo: home once transitioned to PO antibiotics

## 2012-10-08 NOTE — Interdisciplinary (Signed)
Diabetic counting carb viewed by patient  in internet .

## 2012-10-08 NOTE — Plan of Care (Signed)
Text message Dr Ephriam Jenkins : Ms Katherine Church -  BP is high 161/111 P 89   I gave her BP medication that was held this morning bec of low BP this am , she is in a lot of pain 9/10 head ache & jaw   , I gave already her IV Dilaudid & Norco - her next pain med IV is 19:15 - can she have one dose of IV dilaudid ?

## 2012-10-08 NOTE — Plan of Care (Signed)
Patient scheduled for EMMI video about Diabetis  in internet to expire June 30 , 2014 , copy of schedule given to patient .

## 2012-10-08 NOTE — Plan of Care (Signed)
Problem: Pain - Acute  Goal: Control of acute pain  Outcome: Met  Pain assessed as per pain scale and advised pt to report increasing level of pain; encouraged pt to use PCA as needed; will continue to monitor pt condition; stable at present, plan of care ongoing.    Problem: Infection  Goal: Absence of infection signs and symptoms  Outcome: Met  standard precaution implemented; due i.v. antibiotic administered; vital paramaters stable and recent WCC within normal range; will continue to monitor pt condition; plan of care ongoing.    Problem: Alteration in Blood Glucose  Goal: Glucose level within specified parameters  Outcome: Met  Insulin administered as per blood glucose result, no s/s of hypo/hyperglycemia noted; will continue to monitor pt condition; plan of care ongoing.    Problem: Falls - Risk of  Goal: Absence of falls  Outcome: Met  Fall precaution implemented and advised pt to call for assistance when needed; pt able to reposition in bed independently; no incident of fall/injury occurred.    Problem: Discharge Planning  Goal: Participation in care planning  Outcome: Met  Pt well compliant with POC, due medication taken; no plan for discharge at present.

## 2012-10-08 NOTE — Progress Notes (Signed)
Medicine Intern Progress Note: 10/08/2012     Current Hospital Stay:   2 days - Admitted on: 10/06/2012    Subjective: Slept better overnight, Pain well controlled with PCA      Medications:       amLODIPINE  10 mg Daily    atorvastatin  20 mg QPM    carvedilol  25 mg BID    [DISCONTINUED] clindamycin (CLEOCIN) IVPB  600 mg Q8H NR    clonidine  0.3 mg TID    docusate sodium  250 mg BID    [DISCONTINUED] enoxaparin  30 mg Daily    [DISCONTINUED] enoxaparin  30 mg Q12H    enoxaparin  40 mg Daily    gabapentin  300 mg HS    [DISCONTINUED] insulin glargine  0.15 Units/kg Daily before lunch    insulin glargine  19 Units Daily before lunch    insulin lispro  1-10 Units 4x Daily WC    [DISCONTINUED] insulin lispro  2 Units TID AC    insulin lispro  6 Units TID AC    lansoprazole  15 mg QAM AC    lisinopril  40 mg BID    [DISCONTINUED] piperacillin-tazobactam (ZOSYN) IVPB  3,375 mg Q8H    sodium chloride (PF)  3 mL Q8H    vancomycin (VANCOCIN) IVPB  1,000 mg Q8H NR     IV Meds:       [DISCONTINUED] HYDROmorphone 10 mg in sodium chloride 0.9%       PRN Meds:       dextrose  12.5 g PRN    diazepam  5 mg Q12H PRN    [DISCONTINUED] diphenhydrAMINE  25 mg Q4H PRN    glucagon  1 mg Once PRN    glucose 40%  1 Tube PRN    glucose  4 tablet PRN    HYDROcodone-acetaminophen  1 tablet Q4H PRN    HYDROmorphone  0.5 mg Q4H PRN    HYDROmorphone  0.5 mg Q4H PRN    HYDROmorphone  1 mg Q4H PRN    [DISCONTINUED] naloxone  0.1 mg Q2 Min PRN    ondansetron  4 mg Q6H PRN    sodium chloride (PF)  3 mL PRN    zolpidem  5 mg Nightly PRN       Objective:  Vital Signs:  Latest Entry Range (last 24 hours)  Temperature: 97.8 F (36.6 C) Temp  Avg: 98 F (36.7 C)  Min: 97.8 F (36.6 C)  Max: 98.5 F (36.9 C)  Blood pressure (BP): 101/79 mmHg BP  Min: 101/79  Max: 160/110  Heart Rate: 77 Pulse  Avg: 85.8  Min: 77  Max: 92  Respirations: 18 Resp  Avg: 17.6  Min: 16  Max: 18  SpO2: 99 % SpO2  Avg: 97.5 %  Min: 97 %   Max: 99 %      Intake/Output (24hrs):  02/27 0600 - 02/28 0559  In: 2360 [P.O.:960; I.V.:1400]  Out: 1575 [Urine:1575]    Physical Exam:  Gen: Well appearing, WNWD, NAD  Neuro: A+O x3, nonfocal  HEENT: OP clear, mucosa moise, PERRL  Lung: CTA bila, no c/w/r  CV: RRR, no m/r/g  Abd: +BS, soft, NT, ND  Ext: no edema, clubbing, or cyanosis  Skin: Fluctuant mass on the R periauricular  Surface involving there parotid gland. Erythematous non draining, however slightly desquamating this am      Laboratory data: Reviewed notable below  POC glucose 347  CX aspirate Staph  Aureus    Micro:  Lab Results   Component Value Date    BLOODCULT No Growth after 24 hour/s of incubation. 10/06/2012    BLOODCULT No Growth after 24 hour/s of incubation. 10/06/2012    BLOODCULT No Growth after 5 day/s of incubation. 05/01/2011    BLOODCULT No Growth after 5 day/s of incubation. 05/01/2011    BLOODCULT No Growth after 5 day/s of incubation. 04/10/2011    BLOODCULT No Growth after 5 day/s of incubation. 04/10/2011    BLOODCULT No Growth after 5 day/s of incubation. 09/04/2009         Imaging: Reviewed agree with impression    Assessment and Plan:    #Cellulitis   -Cultures sensitivities show sensitivity to vanc  -continue PCA begin transition to PO     #DM.   -Poor control Hb A1C 10.6  -ISS  -15 units Glargine, 5 units lispro Q ac and Q hs plus SS    #HTN   -Normalized this morning with pain control  -Continue to monitor on home meds    #DVT prophylaxis  -Lovenox    #FEN  -Cab limited

## 2012-10-08 NOTE — Consults (Signed)
Diabetes/Endocrine Consult Note    Consulting Physician: Sedalia Muta, MD    Reason for Consult: provide opinion regarding treatment options for diabetes      HPI:  Katherine Church is a 47 year old female with uncontrolled DM2, HTN, HLD, ulcerative colitis and back pain. She is admitted for treatment of R pre-auricular cellulitis.    Asked by Dr Regis Bill and team to provide opinion regarding treatment options for diabetes management.    DM2 x 6 years. Initially started on metformin, unable to tolerate GI side effects. For past yr taking glipizide 5 mg po bid has been advised to start insulin injections, but unable to afford. Denies past dx retinopathy, nephropathy, neuropathy, MI or CVA.    Bought own [facility] brand meter, but has not monitored glucose x 6 months because could not afford to continue purchasing test strips.    Describes Diet as high in CHO. Skips breakfast, has lunch c/w chicken fried steak/potatoes and then similar dinner and from lunch to nighttime high CHO snacks including chocolate ice cream and reeses peanut butter cups. Mentions awareness of negative impact these foods have on her glucose control.    Recently following up at [facility]. Lives with boyfriend.     ROS: presented with R jaw pain/swelling/inability to open mouth and fever/nausea x 3 days, now improving. C/o generalized itching.  + polyuria/polydipsia, weight gain/loss, denies chest pain, SOB, denies change in BM from baseline, denies change in vision from baseline, denies numbness, tingling, pain or new lesions B feet.    PMHx:  Past Medical History   Diagnosis Date   . Back pain    . High blood pressure    . Diabetes mellitus    . Hyperlipidemia LDL goal < 100    . Colitis, ulcerative        PSHx:  Past Surgical History   Procedure Laterality Date   . Pb anesth,tubal ligation/transection  1994   . Colonoscopy  04/2011     Repeat surveillance colo in 2019       SocHx:  T: denies  E: social, rare  D: occasional  marijuana    FamHx:  + DM2    ALL: Ciprofloxacin; Flagyl; and Tetanus toxoids    MEDS:  Current Facility-Administered Medications   Medication   . amLODIPINE (NORVASC) tablet 10 mg   . atorvastatin (LIPITOR) tablet 20 mg   . carvedilol (COREG) tablet 25 mg   . [DISCONTINUED] clindamycin (CLEOCIN) IVPB 600 mg   . clonidine (CATAPRES) tablet 0.3 mg   . dextrose 50 % solution 12.5 g   . diazepam (VALIUM) tablet 5 mg   . [DISCONTINUED] diphenhydrAMINE (BENADRYL) injection 25 mg   . diphenhydrAMINE (BENADRYL) tablet 25 mg   . docusate sodium (COLACE) capsule 250 mg   . [DISCONTINUED] enoxaparin (LOVENOX) injection 30 mg   . [DISCONTINUED] enoxaparin (LOVENOX) injection 30 mg   . enoxaparin (LOVENOX) injection 40 mg   . gabapentin (NEURONTIN) capsule 300 mg   . glucagon (GLUCAGON) injection 1 mg   . glucose 40% oral gel 1 Tube   . glucose chewable tablet 16 g   . HYDROcodone-acetaminophen (NORCO) 5-325 MG tablet 1 tablet   . HYDROmorphone (DILAUDID) injection 0.5 mg   . [DISCONTINUED] HYDROmorphone (DILAUDID) injection 0.5 mg   . [DISCONTINUED] HYDROmorphone (DILAUDID) injection 1 mg   . [DISCONTINUED] HYDROmorphone 10 mg in sodium chloride 0.9% (DILAUDID) PCA (0.2 mg/mL)   . [DISCONTINUED] insulin glargine (LANTUS) injection 12  Units   . insulin glargine (LANTUS) injection 19 Units   . insulin lispro (HUMALOG) injection 1-10 Units   . [DISCONTINUED] insulin lispro (HUMALOG) injection 2 Units   . insulin lispro (HUMALOG) injection 6 Units   . lansoprazole (PREVACID) DR capsule 15 mg   . lisinopril (PRINIVIL, ZESTRIL) tablet 40 mg   . [DISCONTINUED] naloxone (NARCAN) injection 0.1 mg   . ondansetron (ZOFRAN) injection 4 mg   . [DISCONTINUED] piperacillin-tazobactam (ZOSYN) 3,375 mg in sodium chloride 0.9 % 50 mL IVPB   . sodium chloride (PF) 0.9 % flush 3 mL   . sodium chloride (PF) 0.9 % flush 3 mL   . vancomycin (VANCOCIN) 1,000 mg in sodium chloride 0.9 % 250 mL IVPB   . zolpidem (AMBIEN) tablet 5 mg       PHYSICAL  EXAM  Temperature:  [97.8 F (36.6 C)-98.5 F (36.9 C)] 97.8 F (36.6 C) (02/28 0834)  Blood pressure (BP): (101-160)/(75-110) 101/79 mmHg (02/28 0841)  Heart Rate:  [77-92] 77 (02/28 0841)  Respirations:  [16-18] 18 (02/28 0834)  Pain Score:  [-] 8 (02/28 1228)  O2 Device:  [-] None (Room air) (02/28 0226)  SpO2:  [97 %-99 %] 99 % (02/28 0834)  Body mass index is 29.34 kg/(m^2).    GEN: NAD  HENT: erythmatous, intact mass R pre-auricular area  Eyes: PERRL, EOMI  RESP: breathing unlabored  ABD: soft, ND  EXT: no edema  NEURO: no tremor    LABS:  Lab Results   Component Value Date    A1C 10.6 10/07/2012    A1C 8.3 03/15/2012    A1C 10.8 11/06/2011     Lab Results   Component Value Date    NA 131 10/08/2012    K 4.1 10/08/2012    CL 94 10/08/2012    BICARB 23 10/08/2012    BUN 10 10/08/2012    CREAT 0.40 10/08/2012    GLU 400 10/08/2012    [place] 9.5 10/08/2012     Lab Results   Component Value Date    CHOL 250 03/15/2012    HDL 48 03/15/2012    LDLCALC 165 03/15/2012    TRIG 187 03/15/2012     Lab Results   Component Value Date    AST 30 10/06/2012    ALT 47 10/06/2012    ALK 111 10/06/2012    TP 8.3 10/06/2012    ALB 4.6 10/06/2012    TBILI 0.7 10/06/2012    DBILI 0.1 05/05/2011       Diet: CHO Limited Standard    Blood Sugars reviewed:      AM Lu Di HS O/N  2.27.14 331 296 352 300   Lantus    12   Lispro  4 3 5 2     2.28.14 378 329  Lantus   19  Lispro  2+5 6+4    A/P:  Katherine Church is a 47 year old female with uncontrolled DM2, HTN, HLD, ulcerative colitis and back pain. She is admitted for treatment of R pre-auricular cellulitis.    TDD insulin yesterday 26 units. Team already increased basal insulin, recommend further increases to prandial insulin and consideration of increased correction scale if improvements not seen by pre-dinner meal tonight.    Inpatient Recs:  - Continue Lantus 19 unit daily to begin today  - Increase to Lispro 6 units qAC  - continue Lispro Low Correction ACHS, if BG not improved < 200mg /dL pre-dinner,  increase scale to Moderate Dose  AHCS    - Pension scheme manager, CM and SW on board to assist in facilitating resources for health insurance coverage, DME and outpt follow-up    Outpatient Recs/Education:    1. Self-monitor blood glucose: reviewed pre-meal and HbA1C goals and complications of long term uncontrolled hyperglycemis    2. Healthy Eating: reviewed effect of CHO on glucose and recommendations for a consistent CHO limited diet and how to read labels/interpret inpt menu as teaching tool. Also discussed how to occasionally work in pt "sweet" preferences and still maintain relative controlled diet    3. Activity: as tolerated    4. Hypoglycemia: reviewed s/sx and treatment by rule of 15s and notification of PCP for 2 episodes glucose < 70 mg/dL in a week for reevaluation of regimen    5. Medication: discussed mxn action basal/bolus insulin and SU. Pt with return demonstration insulin injection, will need reinforcement with RN ongoing.    6. Discharge regimen: TBD, pt should be d/c on basal-bolus or mixed dose insulin, does not tolerate metformin, not willing to repeat trial. Financial Counselor at bedside sates pt is Medical Pending which will provide for initial insulin and DM supplies, pt aware must complete all paperwork for ongoing health care coverage.  - Lantus dose TBD  - Lispro dose TBD  - Glucose Meter and test strips lancets #100 for 3xdaily injections for pt with uncontrolled DM on insulin  - Insulin syringes #200 for 4 x daily injection    7. Follow-up: with [facility] w/in 1-2 weeks of discharge for ongoing glucose management with glucose meter, log and hospital d/c information    Thank you for this interesting consult, will cont to follow    Felipa Evener, RN, MSN, FNP-BC  Diabetes/Endocrine Nurse Educator

## 2012-10-08 NOTE — ED Follow-up Note (Signed)
Follow-up type: Callback       Routine ED Patient Call Back    Patient contacted by telephone

## 2012-10-08 NOTE — Plan of Care (Signed)
Text message Dr Ephriam Jenkins - [id number] re: Katherine Church - Her bp is low 101/79 P 77 - I held her BP medication this morning Amlodipine , lisinopril , clonidine & coreg .

## 2012-10-08 NOTE — Progress Notes (Signed)
[facility]  Otolaryngology- Head & Neck Surgery  Progress Note      Hospitalization:    2 days - Admitted on: 10/06/2012    Patient Name:  Katherine Church  MRN:    [id number]  Room#:   22/22  Attending:   Sedalia Muta, MD  Service:   ED Medicine      History of Present Illness: Katherine Church is a 47 year old female who has a past medical history of Back pain; High blood pressure; Diabetes mellitus; Hyperlipidemia LDL goal < 100; and Colitis, ulcerative.     Patient is a 47 year old female with past medical hx of diabetes, HTN and ulcers colitis presents with chief complaint of right jaw pain and swelling and intermittent subjective fevers for the last 3-4 days along with nausea. Patient was [place] P[facility] Dr Jake Se[facility] [place] [facility] and had aspiration of the jaw swelling at the clinic prior to arrival and brought a culture tube with her. She notes that the swelling and pain started on Sunday and it might be a potential flea bite. The swelling and redness increased over time and today she went to the doctor to get it checks. She denies any tooth pain. No trouble breathing or swallowing. She notes her nose is a little congested. No bad taste in mouth. No bleeding/discharge from the swelling. It is tender to touch since last few days and she has been nauseous intermittently. No drooling though. She said over the last 3 days she cant open her mouth fully.     Hx of uncontrolled dm, ulcerative colitis and htn. NO surgeries in head and neck area.    24hr events: breathing better, pain controlled, no more headache, right pre-auricular cellulitis reduced.    Social History:  Tobacco: The patient reports that she has never smoked. She has never used smokeless tobacco.  Alcohol: The patient reports that she does not drink alcohol.  Drugs: The patient reports that she does not use illicit drugs.    Past Medical History   Diagnosis Date   . Back pain    . High blood pressure    . Diabetes mellitus    .  Hyperlipidemia LDL goal < 100    . Colitis, ulcerative         Past Surgical History   Procedure Laterality Date   . Pb anesth,tubal ligation/transection  1994   . Colonoscopy  04/2011     Repeat surveillance colo in 2019        Family History   Problem Relation Age of Onset   . Diabetes Father    . Lipids Father    . Hypertension Father    . Hypertension Mother         Allergies   Allergen Reactions   . Ciprofloxacin Rash   . Flagyl (Metronidazole Hcl) Rash   . Tetanus Toxoids Swelling       Current Facility-Administered Medications   Medication   . amLODIPINE (NORVASC) tablet 10 mg   . atorvastatin (LIPITOR) tablet 20 mg   . carvedilol (COREG) tablet 25 mg   . [DISCONTINUED] clindamycin (CLEOCIN) IVPB 600 mg   . clonidine (CATAPRES) tablet 0.3 mg   . dextrose 50 % solution 12.5 g   . diazepam (VALIUM) tablet 5 mg   . diphenhydrAMINE (BENADRYL) injection 25 mg   . docusate sodium (COLACE) capsule 250 mg   . [DISCONTINUED] enoxaparin (LOVENOX) injection 30 mg   . [DISCONTINUED] enoxaparin (  LOVENOX) injection 30 mg   . enoxaparin (LOVENOX) injection 40 mg   . gabapentin (NEURONTIN) capsule 300 mg   . glucagon (GLUCAGON) injection 1 mg   . glucose 40% oral gel 1 Tube   . glucose chewable tablet 16 g   . HYDROmorphone (DILAUDID) injection 0.5 mg   . HYDROmorphone (DILAUDID) injection 1 mg   . HYDROmorphone 10 mg in sodium chloride 0.9% (DILAUDID) PCA (0.2 mg/mL)   . [DISCONTINUED] insulin glargine (LANTUS) injection 12 Units   . insulin glargine (LANTUS) injection 19 Units   . insulin lispro (HUMALOG) injection 1-10 Units   . insulin lispro (HUMALOG) injection 2 Units   . lansoprazole (PREVACID) DR capsule 15 mg   . lisinopril (PRINIVIL, ZESTRIL) tablet 40 mg   . naloxone (NARCAN) injection 0.1 mg   . [DISCONTINUED] ondansetron (ZOFRAN) injection 4 mg   . ondansetron (ZOFRAN) injection 4 mg   . piperacillin-tazobactam (ZOSYN) 3,375 mg in sodium chloride 0.9 % 50 mL IVPB   . sodium chloride (PF) 0.9 % flush 3 mL   .  sodium chloride (PF) 0.9 % flush 3 mL   . [EXPIRED] sodium chloride 0.9% infusion   . vancomycin (VANCOCIN) 1,000 mg in sodium chloride 0.9 % 250 mL IVPB   . zolpidem (AMBIEN) tablet 5 mg        Lab Results   Component Value Date    WBC 9.0 10/07/2012    BAND 8 09/04/2009    HGB 13.3 10/07/2012    HCT 38.0 10/07/2012    PLT 196 10/07/2012    PLT 219 05/21/2009     No results found for this basename: INR,  PTT     Lab Results   Component Value Date    NA 134 10/07/2012    K 4.0 10/07/2012    CL 96 10/07/2012    BICARB 25 10/07/2012    BUN 6 10/07/2012    CREAT 0.39 10/07/2012    GLU 315 10/07/2012    [place] 9.3 10/07/2012    MG 2.0 10/07/2012    PHOS 3.6 10/07/2012       Review of Systems:   GEN:   WNL   NEURO:  WNL   SKIN:   WNL  Musculoskeletal: WNL    CARD:   WNL  RESP:   WNL   GI:   WNL  GU:   WNL   PSY:   WNL   OPHTH:  WNL  HEENT:  Per HPI    Physical Exam:  BP 118/75  Pulse 81  Temp(Src) 98.5 F (36.9 C)  Resp 18  Ht 5\' 4"  (1.626 m)  Wt 77.565 kg (171 lb)  BMI 29.34 kg/m2  SpO2 97%   General: NAD, A&O, follows commands.    Neuro:  CN II-XII grossly intact. PERRLA, EOMI. Face, tongue, and shrug are symmetric.   Ears:   Right ear less tender to touch and pull. Left ear exam normal.  Nose:  external exam normal. No epistaxis or crusts.   Oral cavity/Oropharynx: Trismus +ve Mucus membranes normal. Dentition good. No evidence of masses or lesions. No purulent discharge noted. Salivary expression on parotid massage. VEstibule, BOT, FOM soft.   Neck and Face: reduced swelling size of the swelling on the left angle of jaw and anterior to external ear with no definitive fluctuance but induration and erythema extending into upper neck. Parotid enlargement appreciated.    Laboratory and Radiology:   CT   FINDINGS:  Mild right pre-auricular subcutaneous  soft tissue stranding without evidence   of a focal fluid collection. A tiny focus of gas is visualized (axial image   48, series 2) There is slightly increased attenuation of the  right parotid   gland relative to the left parotid gland. Multiple prominent, subcentimeter   right cervical lymph nodes are noted but not pathologically enlarged by strict  CT size criteria.    There is no evidence of maxillofacial fracture. No evidence of periosteal   reaction. Paranasal sinuses, deep facial compartments, and orbital structures   are normal. The airway is patent.    The skull base and visualized intracranial compartments are unremarkable.    IMPRESSION:  Right pre-auricular cellulitis with reactive inflammation of the right parotid  gland. No focal fluid collections. No periosteal reaction or erosion of   adjacent osseous structures.           Assessment:  This is a 47 year old female with right pre-auricular cellulitis with no dainable abscess but reactive parotitis getting better on IV abx with reduction in cellulitis.    Recommendations:    - no I & D needed.  - cont Iv abx- vanc + zosyn  - warm compresses tid right face  - lemon drops q4h  - aggressive rehydration.    WILL ALSO RECOMMEND STRICT BLOOD SUGAR CONTROL      Thank you for involving [place] in the care of this patient. We will be available if you have further questions or concerns.    Patient seen and examined with Dr Dominga Ferry  Vella Raring, MD  PGY-1, Otolaryngology- Head & Neck Surgery  [facility] pager: [phone]

## 2012-10-09 MED ORDER — AMOXICILLIN-POT CLAVULANATE 875-125 MG OR TABS
1.0000 | ORAL_TABLET | Freq: Two times a day (BID) | ORAL | Status: DC
Start: 2012-10-09 — End: 2012-10-11
  Administered 2012-10-09 – 2012-10-11 (×5): 1 via ORAL
  Filled 2012-10-09 (×5): qty 1

## 2012-10-09 MED ORDER — MORPHINE SULFATE 20 MG OR CP24
20.0000 mg | ORAL_CAPSULE | Freq: Every day | ORAL | Status: DC
Start: 2012-10-09 — End: 2012-10-11
  Administered 2012-10-09 – 2012-10-11 (×3): 20 mg via ORAL
  Filled 2012-10-09 (×3): qty 1

## 2012-10-09 MED ORDER — GABAPENTIN 300 MG OR CAPS
300.0000 mg | ORAL_CAPSULE | Freq: Three times a day (TID) | ORAL | Status: DC
Start: 2012-10-09 — End: 2012-10-11
  Administered 2012-10-09 – 2012-10-11 (×6): 300 mg via ORAL
  Filled 2012-10-09 (×7): qty 1

## 2012-10-09 MED ORDER — INSULIN GLARGINE 100 UNIT/ML SC SOLN
24.0000 [IU] | Freq: Every day | SUBCUTANEOUS | Status: DC
Start: 2012-10-09 — End: 2012-10-10
  Administered 2012-10-09: 24 [IU] via SUBCUTANEOUS
  Filled 2012-10-09: qty 24

## 2012-10-09 MED ORDER — INSULIN LISPRO (HUMAN) 100 UNIT/ML SC SOLN (CUSTOM)
8.0000 [IU] | Freq: Three times a day (TID) | INTRAMUSCULAR | Status: DC
Start: 2012-10-09 — End: 2012-10-10
  Administered 2012-10-09 (×2): 8 [IU] via SUBCUTANEOUS

## 2012-10-09 MED ORDER — HYDROMORPHONE HCL 2 MG OR TABS
2.0000 mg | ORAL_TABLET | ORAL | Status: DC | PRN
Start: 2012-10-09 — End: 2012-10-09

## 2012-10-09 MED ORDER — HYDROMORPHONE HCL 2 MG OR TABS
2.0000 mg | ORAL_TABLET | ORAL | Status: DC | PRN
Start: 2012-10-09 — End: 2012-10-11
  Administered 2012-10-09 – 2012-10-11 (×10): 2 mg via ORAL
  Filled 2012-10-09 (×11): qty 1

## 2012-10-09 NOTE — Interdisciplinary (Signed)
Clinical Nutrition Screen  Diagnoses of : Sepsis [995.91]  Diabetes mellitus [250.00]  Anxiety [300.00]  Cellulitis, face [682.0]  Hypertensive disorder [401.9]  Katherine Church is a 47 year old female   Past Medical History   Diagnosis Date   . Back pain    . High blood pressure    . Diabetes mellitus    . Hyperlipidemia LDL goal < 100    . Colitis, ulcerative     Height: 5\' 4"  (162.6 cm)  Weight - scale: 77.565 kg (171 lb)   Weight Changes: patient stated UBW 171 pounds  Ideal Body Weight: 120 pounds 142% of ideal body weight   Body mass index is 29.34 kg/(m^2).  Current Diet Order: Carb limited  Chewing Difficulty: None  Swallowing Difficulty: None  N/VD: On anti nausea meds  Appetite/Documented/reported: patient stated eating well 80% of meals documented  Skin Integrity per Head to Toe Assessment: Facial redness  Labs:Sodium 131 L Glucose  (POCT) 276 H on 10/09/12  Nutritionally relevant meds: Colace,insulin,zofran and senokot  Allergies/Intolerances/Preferences:  None per patient   Education needs identified or requested: No  Pt nutrition care level:1  Will provide snacks as approved per Registered Dietitian.  Plan:Will provide basic nutrition services and reclassify nutrition status per policy.  Pt status information relayed to Registered Dietitian  Note Author: Westley Gambles Dietetic Technician Registered

## 2012-10-09 NOTE — H&P (Signed)
DAILY PROGRESS NOTE      Current Hospital LOS: 3 days     Subjective/Interval History:  Patient interviewed and examined. Participated for key portions of history and exam with resident team. Reviewed and agree with resident notes.  Still has occasional severe throbbing pain,but overall is better.    Objective:    Vital Signs:   Temperature:  [97.7 F (36.5 C)-98.8 F (37.1 C)] 97.7 F (36.5 C) (03/01 0833)  Blood pressure (BP): (140-161)/(84-111) 150/100 mmHg (03/01 0833)  Heart Rate:  [76-90] 76 (03/01 0833)  Respirations:  [18] 18 (02/28 2022)  Pain Score:  [-] 7 (03/01 0913)  O2 Device:  [-] None (Room air) (03/01 0833)  SpO2:  [95 %-97 %] 97 % (03/01 1610)    Physical Exam:  GEN: alert, pleasant, in pain   Eyes: PERRL, EOMi   ENT: hearing grossly intact, no nasal discharge, OP clear without any lesions, right jaw with erythema/edema over parotid gland, very tender, + cervical LAD,overall improved  CV: +s1, s2; no m/r/g; no peripheral edema; extremities warm and well perfused   Respiratory: lungs clear to auscultation bilaterally, no wheezes, rales or rhonchi   GI: abdomen soft, nt, nd with + bs; no rebound/guarding   MSK: strength grossly intact   Skin: normal color, no lesions   Neurologic: cn II-XII grossly intact, no focal deficits      Laboratory data:   Lab Results   Component Value Date    NA 131* 10/08/2012    K 4.1 10/08/2012    CL 94* 10/08/2012    BICARB 23 10/08/2012    BUN 10 10/08/2012    CREAT 0.40* 10/08/2012    GLU 400* 10/08/2012    [place] 9.5 10/08/2012     Lab Results   Component Value Date    WBC 7.5 10/08/2012    HGB 12.5 10/08/2012    HCT 36.7 10/08/2012    PLT 203 10/08/2012    SEG 63 10/08/2012    LYMPHS 29 10/08/2012    MONOS 5 10/08/2012    EOS 3 10/08/2012     WBC 13.8 on admission    CT Right pre-auricular cellulitis with reactive inflammation of the right parotid gland. No focal fluid collections. No periosteal reaction or erosion of adjacent osseous structures.    Culture of aspirate  MSSA    Assessment and Plan:    Cellulitis of face and parotiditis  Overall improved,culture growing MSSA and Proteus  Transitioning to Augmentin today,will continue to follow cultures for Proteus sensitivities.    DM.  Uncontrolled,A1c up to 10.6 from 8 in August 2013.  On oral meds at home but admits poor control. Will need increased doses of Lantus and short acting here and possibly even at home. Diabetes service evaluation appreciated.    HTN  Will monitor on home meds,control improved once pain better controlled.    DVT prevention  Lovenox    Possible discharge today or tomorrow.

## 2012-10-09 NOTE — Plan of Care (Signed)
Problem: Pain - Acute  Goal: Control of acute pain  Outcome: Met  Patient reported 8/10 pain to right side face, medicated w/ norco x2 tabs.   After 2 hours, patient reported no relief from norco, Dilaudid 0.5mg  IV given prn.  After 1 hour, patient asleep.     Problem: Infection  Goal: Absence of infection signs and symptoms  Outcome: Met  Patient's temp. 98.8 and WBC 7.5. Patient receiving IV antibiotics per MD order.     Problem: Alteration in Blood Glucose  Goal: Glucose level within specified parameters  Outcome: Not Met  Patient's blood glucose 328, lispro 3 units given per MD order.     Problem: Falls - Risk of  Goal: Absence of falls  Outcome: Met  Patient's bed low, call light within reach, gait steady. Patient had no injury this shift.

## 2012-10-09 NOTE — Plan of Care (Signed)
Problem: Pain - Acute  Goal: Control of acute pain  Outcome: Met  See vitals    Problem: Infection  Goal: Absence of infection signs and symptoms  Outcome: Not Met  Pt has right face swellon    Problem: Alteration in Blood Glucose  Goal: Glucose level within specified parameters  Outcome: Not Met  Please see POC for finger stick glucose

## 2012-10-09 NOTE — Progress Notes (Signed)
Diabetes/Endocrine Progress note      HPI:  Katherine Church is a 47 year old female with uncontrolled DM2, HTN, HLD, ulcerative colitis and back pain. She is admitted for treatment of R pre-auricular cellulitis.    A1c  10.6  Wt    77kg  Cr     0.4    BG remains elevated as below      PMHx:  Past Medical History   Diagnosis Date   . Back pain    . High blood pressure    . Diabetes mellitus    . Hyperlipidemia LDL goal < 100    . Colitis, ulcerative        PSHx:  Past Surgical History   Procedure Laterality Date   . Pb anesth,tubal ligation/transection  1994   . Colonoscopy  04/2011     Repeat surveillance colo in 2019       SocHx:  T: denies  E: social, rare  D: occasional marijuana    FamHx:  + DM2    ALL: Ciprofloxacin; Flagyl; and Tetanus toxoids    MEDS:  Current Facility-Administered Medications   Medication   . amLODIPINE (NORVASC) tablet 10 mg   . amoxicillin-clavulanate (AUGMENTIN) 875-125 MG tablet 1 tablet   . atorvastatin (LIPITOR) tablet 20 mg   . carvedilol (COREG) tablet 25 mg   . [DISCONTINUED] ceFAZolin (ANCEF) 1,000 mg in sodium chloride 0.9 % 50 mL IVPB   . clonidine (CATAPRES) tablet 0.3 mg   . dextrose 50 % solution 12.5 g   . diazepam (VALIUM) tablet 5 mg   . [COMPLETED] diphenhydrAMINE (BENADRYL) tablet 25 mg   . docusate sodium (COLACE) capsule 250 mg   . enoxaparin (LOVENOX) injection 40 mg   . gabapentin (NEURONTIN) capsule 300 mg   . [DISCONTINUED] gabapentin (NEURONTIN) capsule 300 mg   . glucagon (GLUCAGON) injection 1 mg   . glucose 40% oral gel 1 Tube   . glucose chewable tablet 16 g   . [DISCONTINUED] HYDROcodone-acetaminophen (NORCO) 5-325 MG tablet 1 tablet   . [DISCONTINUED] HYDROcodone-acetaminophen (NORCO) 5-325 MG tablet 2 tablet   . [DISCONTINUED] HYDROmorphone (DILAUDID) injection 0.5 mg   . HYDROmorphone (DILAUDID) injection 0.5 mg   . [COMPLETED] HYDROmorphone (DILAUDID) injection 0.5 mg   . [DISCONTINUED] HYDROmorphone (DILAUDID) tablet 2 mg   . HYDROmorphone (DILAUDID)  tablet 2 mg   . [DISCONTINUED] insulin glargine (LANTUS) injection 19 Units   . insulin glargine (LANTUS) injection 24 Units   . insulin lispro (HUMALOG) injection 1-10 Units   . [DISCONTINUED] insulin lispro (HUMALOG) injection 6 Units   . insulin lispro (HUMALOG) injection 8 Units   . lansoprazole (PREVACID) DR capsule 15 mg   . lisinopril (PRINIVIL, ZESTRIL) tablet 40 mg   . morphine (KADIAN) ER capsule 20 mg   . ondansetron (ZOFRAN) injection 4 mg   . sodium chloride (PF) 0.9 % flush 3 mL   . sodium chloride (PF) 0.9 % flush 3 mL   . [DISCONTINUED] vancomycin (VANCOCIN) 1,000 mg in sodium chloride 0.9 % 250 mL IVPB   . zolpidem (AMBIEN) tablet 5 mg       PHYSICAL EXAM  Temperature:  [97.7 F (36.5 C)-98.8 F (37.1 C)] 97.7 F (36.5 C) (03/01 1610)  Blood pressure (BP): (140-161)/(84-111) 150/100 mmHg (03/01 0833)  Heart Rate:  [76-90] 76 (03/01 0833)  Respirations:  [18] 18 (02/28 2022)  Pain Score:  [-] 8 (03/01 1111)  O2 Device:  [-] None (Room air) (03/01 9604)  SpO2:  [95 %-97 %] 97 % (03/01 0833)  Body mass index is 29.34 kg/(m^2).      LABS:  Lab Results   Component Value Date    A1C 10.6 10/07/2012    A1C 8.3 03/15/2012    A1C 10.8 11/06/2011     Lab Results   Component Value Date    NA 131 10/08/2012    K 4.1 10/08/2012    CL 94 10/08/2012    BICARB 23 10/08/2012    BUN 10 10/08/2012    CREAT 0.40 10/08/2012    GLU 400 10/08/2012    [place] 9.5 10/08/2012     Lab Results   Component Value Date    CHOL 250 03/15/2012    HDL 48 03/15/2012    LDLCALC 165 03/15/2012    TRIG 187 03/15/2012     Lab Results   Component Value Date    AST 30 10/06/2012    ALT 47 10/06/2012    ALK 111 10/06/2012    TP 8.3 10/06/2012    ALB 4.6 10/06/2012    TBILI 0.7 10/06/2012    DBILI 0.1 05/05/2011       Diet: CHO Limited Standard    Blood Sugars reviewed:      AM Lu Di HS O/N  2.27.14 331 296 352 300   Lantus    12   Lispro  4 3 5 2     2.28.14 378 329 312 328  Lantus   19  Lispro  2+5 6+4 6+4 +3    3.1  276  lantus  lispro  6+3    A/P:  Katherine Church  is a 47 year old female with uncontrolled DM2, HTN, HLD, ulcerative colitis and back pain. She is admitted for treatment of R pre-auricular cellulitis.      Inpatient Recs: (recs paged to 1st to call)  - Increase Lantus to 24 unit daily to begin today  - Increase to Lispro 8 units qAC  - Increase to moderate dose correctional scale    - Ensure Artist, CM and SW on board to assist in facilitating resources for health insurance coverage, DME and outpt follow-up    Outpatient Recs/Education:    1. Self-monitor blood glucose: reviewed pre-meal and HbA1C goals and complications of long term uncontrolled hyperglycemis    2. Healthy Eating: reviewed effect of CHO on glucose and recommendations for a consistent CHO limited diet and how to read labels/interpret inpt menu as teaching tool. Also discussed how to occasionally work in pt "sweet" preferences and still maintain relative controlled diet    3. Activity: as tolerated    4. Hypoglycemia: reviewed s/sx and treatment by rule of 15s and notification of PCP for 2 episodes glucose < 70 mg/dL in a week for reevaluation of regimen    5. Medication: discussed mxn action basal/bolus insulin and SU. Pt with return demonstration insulin injection, will need reinforcement with RN ongoing.    6. Discharge regimen: TBD, pt should be d/c on basal-bolus or mixed dose insulin, does not tolerate metformin, not willing to repeat trial. Financial Counselor at bedside sates pt is Medical Pending which will provide for initial insulin and DM supplies, pt aware must complete all paperwork for ongoing health care coverage.  - Lantus dose TBD  - Lispro dose TBD  - Glucose Meter and test strips lancets #100 for 3xdaily injections for pt with uncontrolled DM on insulin  - Insulin syringes #200 for 4 x daily injection  7. Follow-up: with [facility] w/in 1-2 weeks of discharge for ongoing glucose management with glucose meter, log and hospital d/c information

## 2012-10-10 MED ORDER — HYDROMORPHONE HCL 2 MG/ML IJ SOLN
2.00 mg | Freq: Once | INTRAMUSCULAR | Status: AC
Start: 2012-10-10 — End: 2012-10-10
  Administered 2012-10-10: 2 mg via INTRAVENOUS
  Filled 2012-10-10: qty 1

## 2012-10-10 MED ORDER — PNEUMOCOCCAL VAC POLYVALENT 25 MCG/0.5ML IJ INJ (CUSTOM)
0.5000 mL | INJECTION | INTRAMUSCULAR | Status: DC
Start: 2012-10-10 — End: 2012-10-11

## 2012-10-10 MED ORDER — INSULIN LISPRO (HUMAN) 100 UNIT/ML SC SOLN (CUSTOM)
12.0000 [IU] | Freq: Three times a day (TID) | INTRAMUSCULAR | Status: DC
Start: 2012-10-10 — End: 2012-10-11

## 2012-10-10 MED ORDER — HYDROMORPHONE HCL 2 MG OR TABS
3.0000 mg | ORAL_TABLET | ORAL | Status: DC | PRN
Start: 2012-10-10 — End: 2012-10-11

## 2012-10-10 MED ORDER — INSULIN GLARGINE 100 UNIT/ML SC SOLN
36.0000 [IU] | Freq: Every evening | SUBCUTANEOUS | Status: DC
Start: 2012-10-10 — End: 2012-10-11

## 2012-10-10 MED ORDER — HYDROMORPHONE HCL 4 MG OR TABS
4.00 mg | ORAL_TABLET | Freq: Once | ORAL | Status: AC
Start: 2012-10-10 — End: 2012-10-10
  Administered 2012-10-10: 4 mg via ORAL
  Filled 2012-10-10: qty 1

## 2012-10-10 MED ORDER — MORPHINE SULFATE 20 MG OR CP24
20.0000 mg | ORAL_CAPSULE | Freq: Every day | ORAL | Status: DC
Start: 2012-10-10 — End: 2012-10-11

## 2012-10-10 MED ORDER — AMOXICILLIN-POT CLAVULANATE 875-125 MG OR TABS
1.0000 | ORAL_TABLET | Freq: Two times a day (BID) | ORAL | Status: DC
Start: 2012-10-10 — End: 2015-01-05

## 2012-10-10 MED ORDER — AMOXICILLIN-POT CLAVULANATE 875-125 MG OR TABS
1.0000 | ORAL_TABLET | Freq: Two times a day (BID) | ORAL | Status: DC
Start: 2012-10-10 — End: 2012-10-10

## 2012-10-10 MED ORDER — INSULIN GLARGINE 100 UNIT/ML SC SOLN
30.0000 [IU] | Freq: Every day | SUBCUTANEOUS | Status: DC
Start: 2012-10-10 — End: 2012-10-11
  Administered 2012-10-10 – 2012-10-11 (×2): 30 [IU] via SUBCUTANEOUS
  Filled 2012-10-10 (×2): qty 30

## 2012-10-10 MED ORDER — LANCETS MISC
1.0000 | Freq: Four times a day (QID) | Status: DC
Start: 2012-10-10 — End: 2012-10-11

## 2012-10-10 MED ORDER — INSULIN LISPRO (HUMAN) 100 UNIT/ML SC SOLN (CUSTOM)
11.0000 [IU] | Freq: Three times a day (TID) | INTRAMUSCULAR | Status: DC
Start: 2012-10-10 — End: 2012-10-11
  Administered 2012-10-10 – 2012-10-11 (×4): 11 [IU] via SUBCUTANEOUS
  Filled 2012-10-10 (×3): qty 11

## 2012-10-10 MED ORDER — ONDANSETRON HCL 4 MG OR TABS
4.0000 mg | ORAL_TABLET | Freq: Four times a day (QID) | ORAL | Status: DC | PRN
Start: 2012-10-10 — End: 2012-10-11
  Administered 2012-10-11: 4 mg via ORAL
  Filled 2012-10-10: qty 1

## 2012-10-10 MED ORDER — INSULIN SYRINGE-NEEDLE U-100 31G X 5/16" 0.3 ML MISC
Status: DC
Start: 2012-10-10 — End: 2012-10-11

## 2012-10-10 MED ORDER — LIDOCAINE-EPINEPHRINE 1 %-1:100000 IJ SOLN
10.00 mL | Freq: Once | INTRAMUSCULAR | Status: AC
Start: 2012-10-10 — End: 2012-10-10
  Administered 2012-10-10: 10 mL via INTRADERMAL
  Filled 2012-10-10: qty 10

## 2012-10-10 MED ORDER — GLUCOSE BLOOD VI STRP
1.0000 | ORAL_STRIP | Freq: Four times a day (QID) | Status: DC
Start: 2012-10-10 — End: 2012-10-11

## 2012-10-10 MED ORDER — BLOOD GLUCOSE METER KIT (CUSTOM)
PACK | Status: DC
Start: 2012-10-10 — End: 2012-10-11

## 2012-10-10 MED ORDER — HYDROMORPHONE HCL 1 MG/ML IJ SOLN
1.0000 mg | INTRAMUSCULAR | Status: DC | PRN
Start: 2012-10-10 — End: 2012-10-11
  Administered 2012-10-10 – 2012-10-11 (×5): 1 mg via INTRAVENOUS
  Filled 2012-10-10 (×5): qty 1

## 2012-10-10 NOTE — Discharge Instructions (Addendum)
Diagnosis and Reason for Admission    You were admitted to the hospital for the following reason(s): Right Preauricular Cellulitis     Your full diagnosis list is located on this After Visit Summary in the Hospital Problems section.    What Happened During Your Hospital Stay    The main tests and treatments done for you during this hospitalization were:    IV antibiotics, abscess drainage     The following evaluation is still important to complete after discharge from the hospital:  [facility] in two weeks for blood glucose check  ENT in two weeks with Dr.Hussman     Instructions for After Discharge    Your diet at home should be a diabetic (low-sugar) diet.    Your activity level at home should be:  as much exercise or activity as you can tolerate.    Specific activity restrictions:    Do not drive while taking narcotic pain medications.    Wound or tube care instructions:  None    Your medication list is located on this After Visit Summary in the Current Discharge Medication List section.  Your nurse will review this information with you before you leave the hospital.    It is very important for you to keep a current medication list with you in order to assist your doctors with your medical care.  Bring this After Visit Summary with you to your follow up appointments.    Reasons to Contact a Doctor Urgently    Call 911 or return to the hospital immediately if:  You experience worsening facial swelling, experience facial paralysis, or experience vision changes.     You should contact either your primary care physician or your hospital physician for any of the following reasons: difficulty chewing or unremitting nausea.     If you have any questions about your hospital care, your medications, or if you have new or concerning symptoms soon after going home from the hospital, and you need to contact your hospital physician, your hospital physician can be contacted in the following manner:  [facility] operator at  [phone].    Once you are able to see your primary care physician (PCP), your PCP will then be responsible for further medication refills, or appointment referrals.    What Needs to Happen Next After Discharge -- Appointments and Follow Up    Any appointments already scheduled at [facility] clinics will be listed in the Future Appointments section at the top of this After Visit Summary.  Any appointments that have been requested, but have not yet been scheduled, will be listed below that under Post Discharge Referrals.    Sometimes tests performed in the hospital do not yet have results by the time a patient goes home.  The following key tests will need to be followed up at your next appointment: cultures from your wound     Medical Home Information    Your primary care provider or clinic currently on file at [place] is: Andrey Cota    If we are assigning you a new medical home for your follow up after discharge, that information will appear here:       Specific timing of follow-up:  2 weeks [facility] clinic, one week with PCP      Additional Discharge Information (if applicable)       Cellulitis    You have been diagnosed with cellulitis.    This is a bacterial infection of the skin. Symptoms usually  include redness, swelling, and warmth in the affected area. Some people will have a fever with this infection.    Elevate the extremity above your heart level if possible.    Treatment of cellulitis includes antibiotics and elevation of the affected area. Sometimes the antibiotics need to be given intravenously ("IV") while other infections can be treated with oral (by mouth) medications.    The redness, swelling, warmth, and fever should start to improve after 2-3 days of treatment. You should return here or go to the nearest Emergency Department, or see your primary care doctor for a recheck as directed.    YOU SHOULD SEEK MEDICAL ATTENTION IMMEDIATELY, EITHER HERE OR AT THE NEAREST EMERGENCY DEPARTMENT, IF ANY OF  THE FOLLOWING OCCURS:   Spreading redness even with treatment. You may wish to mark the area of infection with a pen to better watch for improvement or spreading.   Increasing or continued fever after 2-3 days of antibiotics.   Unusual or increasing pain at the site of the infection.   Lightheadedness.   If feeling sicker at any time, or if not improving as expected.             Handouts Given to You (if applicable)    Dressing instructions  - pack your wound with iodoform gauze and using a q tip try to push the gauze in he wound, cover the wound with 4x4 gauze and apply tape.  - when you cant pack no longer, please just put gauze and tape.  - keep wound dry and clean  -try to pack twice a day.  - follow up with Dr Fransisco Hertz in 2 weeks.

## 2012-10-10 NOTE — Discharge Summary (Addendum)
Date of Admission:  10/06/2012  Date of Discharge:  10/10/2012    Patient Name:  Katherine Church    Principal Diagnosis (required):  Sepsis secondary to cellulitis    Hospital Problem List (required):  Active Hospital Problems    Diagnosis   . *Sepsis [995.91]   . Cellulitis [682.9] of face   . Diabetes [250.00]   . Hypertensive disorder [401.9]   . Anxiety [300.00]      Resolved Hospital Problems    Diagnosis   No resolved problems to display.       Additional Hospital Diagnoses ("rule out" or "suspected" diagnoses, etc.):  Parotitis    Principal Procedure During This Hospitalization (required):  CT imaging of face  Incision and drainage of parotid abscess    Other Procedures Performed During This Hospitalization (required):  None    Procedure results are available in Chart Review in Epic.  For those providers external to [facility], the key procedure results are listed below:    CT Maxillofacial 10/06/12  IMPRESSION:  Right pre-auricular cellulitis with reactive inflammation of the right parotid gland. No focal fluid collections. No periosteal reaction or erosion of adjacent osseous structures.      Consultations Obtained During This Hospitalization:  Endocrinology  Head & Neck Surgery      Reason for Admission to the Hospital / History of Present Illness: From [facility]'s admission H&P  Katherine Church is a 47 year old female with NIDDM, HTN, HLD, ulcerative colitis and chronic back pain who presents to the ED with right sided jaw swelling and pain.   Patient was cleaning rescue dogs with fleas on Saturday, had some bites on arms and maybe neck. Woke up Sunday morning with painful swollen right jaw   Low grade temp. Symptoms worsened over past several days. Increased right ear pressure. Unable to fully open mouth due to pain. Painful swallowing. Nausea but no vomiting. Endorses HA. No visual changes. Some right sided neck stiffness.   Seen by PCP Dr. Roger Shelter on day of admission who aspirated a fluid sample (gm stain  and culture pending) and sent patient to ED.      Hospital Course by Problem (required):    #R Facial Cellulitis/Parotitis: Initial CT scan pre-auricular fat stranding with no evidence of focal fluid collection in parotid gland. ENT evaluated patient and initially deferred on I&D, recommending broad spectrum antibiotics until the initial gram stain and cultures resulted. Patient received IV vanc and zosyn before being transitioned to ancef based on sensitivities. After remaining afebrile and without leukocytosis patient was successfully transitioned to PO augmentin.   ENT drained the abscess prior to discharge and 3 cc of pus were obtained. Patient was discharged home with wound care supplies after being instructed by the wound care nurse how to change packing of right face wound. She will likely need to pack the wound for two weeks prior to being seen by Dr.Hussman in ENT clinic in two weeks. [name] will receive PO dilaudid for analgesia in the post-discharge period.    #DM: patient's blood glucose was found to be poorly controlled on admission (400's) and her Hgb A1c was noted to be 10.6 (up from 8 in 03/2012). [name]'s insulin regimen was dosed up stepwise and at the time of discharge was 36 lantus QHS and lispro 12 QAC. Endocrine was consulted and will evaluate patient was an OP in [facility] clinic in two weeks time.     #HTN: patients blood pressure was well controlled during the admission  on home anti-hypertensive regimen of amlodipine and lisinopril. Isolated SBP elevations occurred in the setting of pain.       Tests Outstanding at Discharge Requiring Follow Up:  Culture of abscess    Discharge Condition (required):  Good.    Key Physical Exam Findings at Discharge:  Physical examination is significant for:  R preauricular swelling and erythema.    Discharge Diet:  Diabetic / low-carbohydrate.    Discharge Medications:  Discharge Medication List as of 10/11/2012  3:51 PM      START taking these medications     Details   !! docusate sodium (COLACE) 250 MG capsule Take 1 capsule by mouth 2 times daily., Disp-60 capsule, R-1, ePrescribe      Gauze Pads & Dressings (CURITY IODOFORM PACKING STRIP) MISC Apply 1 bottle topically daily., Disp-14 each, R-0, ePrescribe      Gauze Pads & Dressings (GAUZE DRESSING) 4"X4" PADS Apply 1 Application topically daily., Disp-24 each, R-0, ePrescribe      lansoprazole (PREVACID SOLUTAB) 30 MG disintegrating tablet Take 1 tablet by mouth daily., Disp-30 tablet, R-0, ePrescribe      !! lisinopril (PRINIVIL, ZESTRIL) 40 MG tablet Take 1 tablet by mouth 2 times daily., Disp-60 tablet, R-1, ePrescribe      ondansetron (ZOFRAN ODT) 4 MG disintegrating tablet Take 1 tablet by mouth every 6 hours as needed for Nausea., Disp-20 tablet, R-0, ePrescribe      !! senna (SENOKOT) 8.6 MG tablet Take 1 tablet by mouth daily., Disp-30 tablet, R-1, ePrescribe       !! - Potential duplicate medications found. Please discuss with provider.      CONTINUE these medications which have CHANGED    Details   amoxicillin-clavulanate (AUGMENTIN) 875-125 MG per tablet Take 1 tablet by mouth every 12 hours., Disp-24 tablet, R-12, ePrescribe      blood glucose meter Use as directed., Disp-1 Device, R-0, ePrescribeDispense a meter from [employer].      glucose blood test strip 1 strip by Other route 4 times daily (before meals and nightly)., Disp-100 strip, R-5, ePrescribeDispense testing strips from [employer].      HYDROmorphone (DILAUDID) 2 MG tablet Take 1.5 tablets by mouth every 4 hours as needed for Moderate Pain (Pain Score 4-6) or Severe Pain (Pain Score 7-10)., Disp-18 tablet, R-0, Handwritten      insulin glargine (LANTUS) 100 UNIT/ML injection Inject 36 Units under the skin nightly., Disp-10 mL, R-5, ePrescribe      insulin lispro (HUMALOG) 100 UNIT/ML injection Inject 12 Units under the skin 3 times daily (before meals)., Disp-10 mL, R-5, ePrescribe      Insulin Syringe-Needle U-100 31G X 5/16" 0.3  ML MISC Use one syringe with each insulin administration., Disp-100 each, R-5, ePrescribe      lancets 1 Lancet by Other route 4 times daily (before meals and nightly)., Disp-100 Lancet, R-5, ePrescribeDispense lancets from [employer].         CONTINUE these medications which have NOT CHANGED    Details   amLODIPINE (NORVASC) 10 MG tablet Take 1 tablet by mouth daily., 10 mg, Oral, DAILY Starting 12/18/2010, Last dose on Fri 06/04/18, Disp-90 tablet, R-3, ePrescribe      atorvastatin (LIPITOR) 20 MG tablet Take 1 tablet by mouth daily., Disp-90 tablet, R-1, ePrescribe      diazepam (VALIUM) 5 MG tablet Take 5 mg by mouth every 12 hours as needed., Historical Med      dicyclomine (BENTYL) 10 MG capsule Take 1  capsule by mouth 3 times daily (before meals)., Disp-60 capsule, R-0, [employer]      !! docusate sodium (COLACE) 250 MG capsule Take 1 capsule by mouth 2 times daily., 250 mg, Oral, 2 TIMES DAILY Starting 07/09/2011, Until Discontinued, Disp-60 capsule, R-0, ePrescribe      !! lisinopril (PRINIVIL, ZESTRIL) 40 MG tablet Take 1 tablet by mouth 2 times daily., Disp-180 tablet, R-3, ePrescribe      omeprazole (PRILOSEC) 20 MG capsule Take 20 mg by mouth daily., 20 mg, Oral, DAILY, Until Discontinued, Historical Med      !! senna (SENOKOT) 8.6 MG tablet Take 1 tablet by mouth daily., 8.6 mg, Oral, DAILY Starting 05/06/2011, Until Discontinued, Disp-30 tablet, R-1, Fax       !! - Potential duplicate medications found. Please discuss with provider.      STOP taking these medications       Calcium Carbonate Antacid (TUMS PO) Comments:   Reason for Stopping:         carvedilol (COREG) 25 MG tablet Comments:   Reason for Stopping:         clonidine (CATAPRES) 0.3 MG tablet Comments:   Reason for Stopping:         glipiZIDE (GLUCOTROL) 10 MG tablet Comments:   Reason for Stopping:         morphine (KADIAN) 20 MG SR capsule Comments:   Reason for Stopping:         polyethylene glycol (GLYCOLAX) powder Comments:     Reason for Stopping:               Allergies:  Allergies   Allergen Reactions   . Ciprofloxacin Rash   . Flagyl (Metronidazole Hcl) Rash   . Tetanus Toxoids Swelling       Discharge Disposition:  Home.    Discharge Code Status:  Full code / full care  This code status is not changed from the time of admission.    Follow Up Appointments:    Scheduled appointments:  No future appointments.    For appointments requested for after discharge that have not yet been scheduled, refer to the Post Discharge Referrals section of the After Visit Summary.    Discharging Physician's Contact Information:  [facility] phone triage at [phone].

## 2012-10-10 NOTE — Interdisciplinary (Signed)
Pt was show today and yesterday how to load insulin syringe correctly with lantus and pt injected self correctly today and yesterday with the correct dose of insulin.Pt also was show how to load insulin syringe with lispro insulin and pt injected self with lispro correctly.Pt was made aware that the lispro insulin and that the lantus insulin can not be combined in the same  Insulin syringe.Pt also was shown how to use lancets and  How  To load  The glucose blood test strip into the glucometer and how to add drop to  Glucose blood test strip. Pt was able to inject self correctly with insulin and  Was able to use lancet and load glucose strip with blood and into our glucometer machine. Will continue to practice.

## 2012-10-10 NOTE — Plan of Care (Signed)
Problem: Pain - Acute  Goal: Control of acute pain  Outcome: Not Met  Please see vitals for pain assesment. MD  Rosie Fate already awared pt has severe pain and MD Ephriam Jenkins already made modifications to pt's pain medication regimen.Will continue to monitor pt's pain.        Problem: Infection  Goal: Absence of infection signs and symptoms  Outcome: Not Met  Pt right face swellon.ENT MD did incision and  Drained  pt's  abcess at the bedside.Wound culture sent to the lab.    Problem: Alteration in Blood Glucose  Goal: Glucose level within specified parameters  Outcome: Not Met  Please see POC for finger stick glucose levels. MD Ephriam Jenkins already adjusted pt's lantus dose and lispro nutritional insulin dose to better control the glucose level. Will continue to monitor

## 2012-10-10 NOTE — Progress Notes (Addendum)
Date: October 10, 2012    Patient Name: Katherine Church    Medical Record #: [id number]          Hospital Day:   4 days - Admitted on: 10/06/2012     PROGRESS NOTE -- OTOLARYNGOLOGY HEAD & NECK SURGERY    24 hour events/Subjective Continues to improve steadily. Eager for DC home.     Exam:  BP 150/97  Pulse 76  Temp(Src) 98.8 F (37.1 C)  Resp 18  Ht 5\' 4"  (1.626 m)  Wt 77.565 kg (171 lb)  BMI 29.34 kg/m2  SpO2 95%  RANGE(last 24 hrs):  BP  Min: 112/76  Max: 150/97  Temp  Avg: 98.3 F (36.8 C)  Min: 97.9 F (36.6 C)  Max: 98.9 F (37.2 C)  Pulse  Avg: 75.8  Min: 73  Max: 78  Resp  Avg: 18.4  Min: 16  Max: 20  SpO2  Avg: 97 %  Min: 95 %  Max: 98 %    General:  NAD, resting comfortably  CV/Pulm: RRR, Normal respiratory effort  Neuro:  CN 2-12  intact  R preauricular area with approx 5cm of now fluctuant, indurated area with focal abscess pocket palpable. Erythematous and tender, no puncta. Less edematous per pt.     PROCEDURE NOTE: incision and drainage of abscess    Location: R preauricular area  Size: 5cm    Informed consent obtained and saved in the chart.  Time out performed verifying patient's name, medical record number, and procedure details.  Local anesthesia given with 5 mL 1% lidocaine with 1:100,000 epinephrine. The area was cleaned and prepped in the normal sterile fashion. A 15 blade was used to lance the abscess. Curved hemostat was used to explore and open the abscess pockets. Approximately 3cc of purulent fluid was expressed. Culture was  obtained. The wound was irrigated with normal saline flushes and packed with 1/2 inch iodoform gauze and covered with a simple gauze dressing. Patient tolerated procedure well; no complications.       ASSESSMENT / PLAN:  Loreta Blouch is a 47 year old female with left preauricular abscess as progression from cellulitis. Improving on abx and now s/p I&D at bedside. Packed with iodoform gauze.     ENT considerations/recommendations include:  - Pain  control  - Continue ABX per primary  Would recommend packing changes BID if in house. If she is to go home then she can get packing supplies to change tonight on her own or with help. Or if they are uncomfortable doing this then she may go to the ED tomorrow for packing change. Ok to shower, just change packing afterwards.   - Per discussion with primary team, she has follow up scheduled with PCP this next week, which is appreciated    Thank you for this consult.  We will be available at pager # below if you have further questions or concerns.    Patient examined and discussed with Dr. Shirlyn Goltz who agrees with plan.    Wynonia Lawman  Resident, Otolaryngology - Head & Neck Surgery  [facility] pager [phone]  [facility] pager [phone]

## 2012-10-10 NOTE — H&P (Signed)
DAILY PROGRESS NOTE      Current Hospital LOS: 4 days     Subjective/Interval History:  Patient interviewed and examined. Participated for key portions of history and exam with resident team. Reviewed and agree with resident notes.  Improved.    Objective:    Vital Signs:   Temperature:  [97.9 F (36.6 C)-98.9 F (37.2 C)] 98.8 F (37.1 C) (03/02 0800)  Blood pressure (BP): (112-150)/(76-97) 150/97 mmHg (03/02 0800)  Heart Rate:  [73-78] 76 (03/02 0800)  Respirations:  [16-20] 18 (03/02 0800)  Pain Score:  [-] 8 (03/02 0816)  O2 Device:  [-] None (Room air) (03/02 0555)  SpO2:  [95 %-98 %] 95 % (03/02 0800)    Physical Exam:  GEN: alert, pleasant, in pain   Eyes: PERRL, EOMi   ENT: hearing grossly intact, no nasal discharge, OP clear without any lesions, right jaw with erythema/edema over parotid gland, very tender, + cervical LAD,overall improved  CV: +s1, s2; no m/r/g; no peripheral edema; extremities warm and well perfused   Respiratory: lungs clear to auscultation bilaterally, no wheezes, rales or rhonchi   GI: abdomen soft, nt, nd with + bs; no rebound/guarding   MSK: strength grossly intact   Skin: normal color, no lesions   Neurologic: cn II-XII grossly intact, no focal deficits      Laboratory data:   No labs today    Culture of aspirate MSSA,proteus    Assessment and Plan:    Cellulitis of face and parotiditis  Overall improved,culture growing MSSA and Proteus  Transitioned to Augmentin on 03/01 with continued improvement.    DM.  Uncontrolled,A1c up to 10.6 from 8 in August 2013.  On oral meds at home but admits poor control. Discharging on  Lantus and short acting insulin,patient demonstrated she is able to administer at home. Diabetes service evaluation appreciated.    HTN  No change in home meds,control improved once pain better controlled.    Discharge today, see discharge summary for more details.

## 2012-10-10 NOTE — Progress Notes (Addendum)
Daily Progress Note:  10/10/2012     Current Hospital Stay:   4 days - Admitted on: 10/06/2012    Subjective:  Patient underwent I&D in AM and in excruciating pain afterwards. No fevers,chills,night sweats.     Objective:  Vital Signs:  Temperature:  [97.9 F (36.6 C)-98.9 F (37.2 C)] 98.6 F (37 C) (03/02 1611)  Blood pressure (BP): (114-150)/(79-97) 146/88 mmHg (03/02 1611)  Heart Rate:  [73-79] 79 (03/02 1611)  Respirations:  [16-20] 18 (03/02 1611)  Pain Score:  [-] 9 (03/02 1611)  O2 Device:  [-] None (Room air) (03/02 0555)  SpO2:  [95 %-98 %] 97 % (03/02 1611)    Wt Readings from Last 1 Encounters:   10/06/12 77.565 kg (171 lb)       Intake/Output (Current Shift):       Physical Exam:  GEN: alert, pleasant, in pain   Eyes: PERRL, EOMi   ENT: OP clear without any lesions, right jaw with erythema/edema over parotid gland, very tender, + cervical LAD,overall with interval improvement.   CV: +s1, s2; no m/r/g; no peripheral edema; extremities warm and well perfused   Respiratory: lungs clear to auscultation bilaterally  GI: abdomen soft, nt, nd with + bs; no rebound/guarding   MSK: strength grossly intact   Skin: normal color, no lesions     Laboratory data:   No results found for this basename: NA, K, CL, BICARB, BUN, CREAT, GLU, [place]     No results found for this basename: WBC, HGB, HCT, PLT, SEG, BAND, LYMPHS, MONOS, EOS, NRBC       Assessment and Plan:  Patient is a 47 year female with poorly controlled DMT2,[place],and HTN who presents with R jaw swelling and subjective fevers of three day onset.     #R Facial Cellulitis/Parotitis   -Afebrile without leukocytosis.   -On D2/14 of augmentin   -Now s/p I&D of parotid abscess  -Patient in excruciating pain post-op, will require another night to better manage pain. Currently on dilaudid 1mg  IV q3 PRN with 2mg  PO Q3 PRN    #DM.   -Poorly controlled A1c (up to 10.6 from 8) in August 2013.   -Appreciate diabetes education assistance;currently glargine 34 QHS and 12 QAC  + SSI   -BG ranging in mid 200's today    #HTN   -Will continue home medication regimen of lisinopril and amlodipine  -Isolated elevations in the setting of sympathetic pain response     #Gen   -Carb limited diet   -Lovenox for VTE ppx   -FC/FC   -Dispo: home once pain control is managed    Patient seen and d/w Dr.Johnryan Sao      ATTENDING PROGRESS NOTE ATTESTATION  Patient interviewed and examined. Participated for key portions of history and exam with resident team. Reviewed and agree with resident notes.  Subjective  In significant pain after I&D.    Objective    I have examined the patient and concur with the resident exam.    Assessment and Plan    I agree with the resident care plan.    See the resident note for further details.

## 2012-10-10 NOTE — Plan of Care (Signed)
Problem: Pain - Acute  Goal: Control of acute pain  Still has pain in the right side of her face where she has the cellulitis, she get po pain meds as ordered and iv dilaudid 0.5 mg iv for breakthrough pain and per pt info her pain is controlled with the current pain management, instructed to inform the nurse if she is not comfortable, pt is a/o x 4 and able to verbalized her pain and discomfort. Pt sleeping at this time.     Problem: Infection  Goal: Absence of infection signs and symptoms  She remains afebrile, vs stable, still on po antibiotic. Affected area with cellulitis is improving and healing site is dry no drainage, still swollen and pink. Will cont to monitor. Good handwashing observed before and after in contact with pt.     Problem: Alteration in Blood Glucose  Goal: Glucose level within specified parameters  Blood sugar fingerstick at hs still running high in the 300's, sliding scale coverage was given, instructed pt on s/s of hypoglycemia such as clammy skin, shaky, feeling very weak, instructed to call the nurse right away for immediate intervention.     Problem: Falls - Risk of  Goal: Absence of falls  No episode of fall, Calls for assistance to the nurse, bed in low position with brakes on. Hourly rounds for safety and to anticipate her needs, instructed on fall precautions and to call the nurse for any discomfort, pain and assistance needed.

## 2012-10-11 MED ORDER — LANCETS MISC
1.0000 | Freq: Four times a day (QID) | Status: DC
Start: 2012-10-11 — End: 2015-01-05

## 2012-10-11 MED ORDER — DOCUSATE SODIUM 250 MG OR CAPS
250.00 mg | ORAL_CAPSULE | Freq: Two times a day (BID) | ORAL | Status: AC
Start: 2012-10-11 — End: ?

## 2012-10-11 MED ORDER — INFLUENZA VIRUS VACC SPLIT PF IM (LATEX) SUSP
0.5000 mL | INTRAMUSCULAR | Status: DC
Start: 2012-10-11 — End: 2012-10-11

## 2012-10-11 MED ORDER — CURITY IODOFORM PACKING STRIP MISC
1.0000 | Freq: Every day | Status: DC
Start: 2012-10-11 — End: 2015-01-05

## 2012-10-11 MED ORDER — BLOOD GLUCOSE METER KIT (CUSTOM)
PACK | Status: DC
Start: 2012-10-11 — End: 2015-01-05

## 2012-10-11 MED ORDER — INSULIN LISPRO (HUMAN) 100 UNIT/ML SC SOLN
12.00 [IU] | Freq: Three times a day (TID) | SUBCUTANEOUS | Status: AC
Start: 2012-10-11 — End: ?

## 2012-10-11 MED ORDER — INSULIN LISPRO (HUMAN) 100 UNIT/ML SC SOLN (CUSTOM)
14.0000 [IU] | Freq: Three times a day (TID) | INTRAMUSCULAR | Status: DC
Start: 2012-10-11 — End: 2012-10-11

## 2012-10-11 MED ORDER — INSULIN GLARGINE 100 UNIT/ML SC SOLN
36.00 [IU] | Freq: Every evening | SUBCUTANEOUS | Status: AC
Start: 2012-10-11 — End: ?

## 2012-10-11 MED ORDER — GAUZE DRESSING 4"X4" PADS
1.0000 | MEDICATED_PAD | Freq: Every day | Status: DC
Start: 2012-10-11 — End: 2015-01-05

## 2012-10-11 MED ORDER — ONDANSETRON 4 MG OR TBDP
4.00 mg | ORAL_TABLET | Freq: Four times a day (QID) | ORAL | Status: AC | PRN
Start: 2012-10-11 — End: ?

## 2012-10-11 MED ORDER — GLUCOSE BLOOD VI STRP
1.0000 | ORAL_STRIP | Freq: Four times a day (QID) | Status: AC
Start: 2012-10-11 — End: ?

## 2012-10-11 MED ORDER — INSULIN GLARGINE 100 UNIT/ML SC SOLN
42.0000 [IU] | Freq: Every evening | SUBCUTANEOUS | Status: DC
Start: 2012-10-11 — End: 2012-10-11

## 2012-10-11 MED ORDER — INSULIN SYRINGE-NEEDLE U-100 31G X 5/16" 0.3 ML MISC
Status: DC
Start: 2012-10-11 — End: 2015-01-05

## 2012-10-11 MED ORDER — LANSOPRAZOLE 30 MG OR TBDP
30.00 mg | ORAL_TABLET | Freq: Every day | ORAL | Status: AC
Start: 2012-10-11 — End: ?

## 2012-10-11 MED ORDER — SENNA 8.6 MG OR TABS
8.6000 mg | ORAL_TABLET | Freq: Every day | ORAL | Status: DC
Start: 2012-10-11 — End: 2015-01-05

## 2012-10-11 MED ORDER — FLUZONE PEDIATRIC PF IM SUSP
0.2500 mL | INTRAMUSCULAR | Status: DC
Start: 2012-10-11 — End: 2012-10-11

## 2012-10-11 MED ORDER — HYDROMORPHONE HCL 2 MG OR TABS
3.0000 mg | ORAL_TABLET | ORAL | Status: DC | PRN
Start: 2012-10-11 — End: 2015-01-05

## 2012-10-11 MED ORDER — LISINOPRIL 40 MG OR TABS
40.00 mg | ORAL_TABLET | Freq: Two times a day (BID) | ORAL | Status: AC
Start: 2012-10-11 — End: ?

## 2012-10-11 NOTE — Interdisciplinary (Signed)
AVS explain to the patient, diet, meds, activity, follow up appointment w/ MD and to call MD or 911  For s/s of  infection, SOB, chest pain or if there's changes in status . pt verbalizes understanding, all questions answered. Prescription picked up in the [facility] pharmacy.  Teach pt how to do her  Wound packing. Used a Ship broker and did a step by step teaching. Pt verbalized understanding. Provided her w/ dressing supplies. Glucometer,insulin,steristrips and lanchet and meds  was provided by [employer]. pls refer to Case manager's notes. IV d/c'd.  All belongings w. The patient. Transport called, d/c patient home.

## 2012-10-11 NOTE — Plan of Care (Signed)
Problem: Pain - Acute  Goal: Control of acute pain  Pt able to adequately communicate pain level using verbal 0-10 pain scale. Educated pt on pharm pain management options, pt verbalized understanding. Encouraged pt to request pain meds prior to pain becoming severe, pt verbalized understanding. Pt with significant pain overnight due to I&D on 3/2 but pt verbalized satisfaction with pain regimen. Ice was used for relief as well.         Problem: Infection  Goal: Absence of infection signs and symptoms  Outcome: Met  Pt afebrile, VS stable, skin warm and dry, pt denies chills. WBC on 2/28 = 7.5. Pt receiving Augmentin every 12 hours. UTA incision to R face due to dressing. Serosanguinous drainage noted to drainage. Encouraged pt to refrain from touching incision. Proper hand hygiene performed before and after pt care provided.      Problem: Alteration in Blood Glucose  Goal: Glucose level within specified parameters  Outcome: Not Met  Pt's blood glucose at 2043 = 270. 2 units of sliding scale Humalog was administered per MD order. No signs or symptoms of hypo/hyperglycemia noted.     Problem: Falls - Risk of  Goal: Absence of falls  Outcome: Met  Performed hourly rounds, side rails up x 2, call light and bedside table within reach, ambulates with non-skid socks, bed locked and in lowest position, room free from clutter. Instructed pt to use call light for assistance. Pt ambulates independently with stead gait.         Problem: Discharge Planning  Goal: Participation in care planning  Outcome: Met  Pt involved in POC regarding pain management, wound care, medications, activity, safety, and diet. Plan is for discharge in the morning.

## 2012-10-11 NOTE — Interdisciplinary (Cosign Needed)
R jaw incision packed with iodoform gauze and covered with 4x4 gauze.  No drainage noted, with redness & edema around the area.  Educated pt step-by-step while completing dressing change. Pt verbalized understanding.

## 2012-10-11 NOTE — Progress Notes (Addendum)
Date: October 11, 2012    Patient Name: Katherine Church    Medical Record #: [id number]          Hospital Day:   5 days - Admitted on: 10/06/2012     PROGRESS NOTE -- OTOLARYNGOLOGY HEAD & NECK SURGERY    24 hour events/Subjective Continues to improve steadily. Swelling reduced, pain controlled, no fevers overnight    Exam:  BP 110/70  Pulse 80  Temp(Src) 98.5 F (36.9 C)  Resp 18  Ht 5\' 4"  (1.626 m)  Wt 77.565 kg (171 lb)  BMI 29.34 kg/m2  SpO2 97%  RANGE(last 24 hrs):  BP  Min: 112/76  Max: 150/97  Temp  Avg: 98.3 F (36.8 C)  Min: 97.9 F (36.6 C)  Max: 98.9 F (37.2 C)  Pulse  Avg: 75.8  Min: 73  Max: 78  Resp  Avg: 18.4  Min: 16  Max: 20  SpO2  Avg: 97 %  Min: 95 %  Max: 98 %    General:  NAD, resting comfortably  CV/Pulm: RRR, Normal respiratory effort  Neuro:  CN 2-12  intact  R preauricular area with packing in place from I &D at bedside yesterday. Reduced swelling and erythema, no fluctuance noted, repacked today and patient to teach the packing technique before she goes home.       ASSESSMENT / PLAN:  Katherine Church is a 47 year old female with left preauricular abscess as progression from cellulitis. Improving on abx and now POD1 s/p I&D at bedside. Packed with iodoform gauze.     ENT considerations/recommendations include:  - Pain control  - Continue ABX per primary  Would recommend packing changes BID if in house. If she is to go home then she can get packing supplies and change packing herself at home- nurse to teach the patient today. Ok to shower, just change packing afterwards.   - Per discussion with primary team, she has follow up scheduled with PCP this next week, which is appreciated.  - FU in 2  Weeks with Dr Fransisco Hertz.    Thank you for this consult.  We will be available at pager # below if you have further questions or concerns.    Patient examined and discussed with Dr. Dominga Ferry who agrees with plan.    Vella Raring MD PGY1  Resident, Otolaryngology - Head & Neck  Surgery  [facility] pager [phone]  [facility] pager [phone]

## 2012-10-11 NOTE — Progress Notes (Signed)
Daily Progress Note:  10/10/2012     Current Hospital Stay:   5 days - Admitted on: 10/06/2012    Subjective:  Patient interviewed and examined. Participated for key portions of history and exam with resident team. Reviewed and agree with resident notes.  Doing better,more comfortable,packing still causing significant pain.  Objective:  Vital Signs:  Temperature:  [98.5 F (36.9 C)-98.6 F (37 C)] 98.5 F (36.9 C) (03/02 2107)  Blood pressure (BP): (110-166)/(70-106) 111/79 mmHg (03/03 1002)  Heart Rate:  [77-80] 77 (03/03 1002)  Respirations:  [18] 18 (03/02 2107)  Pain Score:  [-] 7 (03/03 1002)  O2 Device:  [-]   SpO2:  [97 %] 97 % (03/02 2107)    Wt Readings from Last 1 Encounters:   10/06/12 77.565 kg (171 lb)       Intake/Output (Current Shift):  03/03 0600 - 03/03 1759  In: 440 [P.O.:440]  Out: -     Physical Exam:  GEN: alert, pleasant, in pain   Eyes: PERRL, EOMi   ENT: OP clear without any lesions, wound packed,no significant surrounding erythema  CV: +s1, s2; no m/r/g; no peripheral edema; extremities warm and well perfused   Respiratory: lungs clear to auscultation bilaterally  GI: abdomen soft, nt, nd with + bs; no rebound/guarding   MSK: strength grossly intact   Skin: normal color, no lesions     Laboratory data:     Lab Results   Component Value Date    WBC 9.0 10/11/2012    HGB 13.3 10/11/2012    HCT 38.4 10/11/2012    PLT 216 10/11/2012    SEG 59 10/11/2012    LYMPHS 33 10/11/2012    MONOS 5 10/11/2012    EOS 3 10/11/2012       Assessment and Plan:  Patient is a 47 year female with poorly controlled DMT2,[place],and HTN who presents with R jaw swelling and subjective fevers of three day onset.     #R Facial Cellulitis/Parotitis   -Afebrile without leukocytosis.   -On D3/14 of augmentin   -Now s/p I&D of parotid abscess  -wound care education in progress    #DM.   -Poorly controlled A1c (up to 10.6 from 8) in August 2013.   -Appreciate diabetes education assistance;currently glargine 34 QHS and 12 QAC + SSI   Will  need outpatient follow up with reevaluation of DM management.    #HTN   -Will continue home medication regimen of lisinopril and amlodipine  -Isolated elevations in the setting of sympathetic pain response     Discharge today.

## 2012-10-11 NOTE — Interdisciplinary (Signed)
Spoke with RN Darlene regarding Head and Neck surgery order to teach pt home wound care for surgical site. Primary RN has gone over with family member who will pack wound according to Head&Neck recommendations.     Signed: Philis Fendt Karandeep Resende RN, [employer]

## 2012-10-11 NOTE — Interdisciplinary (Cosign Needed)
Pt's complaining of headache & pain on the R jaw.  BP 146/106, HR 80. BP & pain meds given as ordered. Dr. Ephriam Jenkins notified. Will continue to monitor.

## 2012-10-11 NOTE — Progress Notes (Signed)
Diabetes/Endocrine Progress Note    Overnight: no acute event    Subjective: pt c/o dizziness, feeling overwhelmed with hospital instructions and daily changes to insulin dosing. Denies between meal snacking or overnight snacking.    PHYSICAL EXAM  Temperature:  [98.5 F (36.9 C)-98.6 F (37 C)] 98.5 F (36.9 C) (03/02 2107)  Blood pressure (BP): (110-166)/(70-106) 111/79 mmHg (03/03 1002)  Heart Rate:  [77-80] 77 (03/03 1002)  Respirations:  [18] 18 (03/02 2107)  Pain Score:  [-] 7 (03/03 1002)  O2 Device:  [-]   SpO2:  [97 %] 97 % (03/02 2107)  Body mass index is 29.34 kg/(m^2).    GEN: tearful  RESP: breathing unlabored      LABS:  Lab Results   Component Value Date    A1C 10.6 10/07/2012    A1C 8.3 03/15/2012    A1C 10.8 11/06/2011       Diet: CHO Limited Standard    Blood Sugars reviewed:      AM Lu Di HS O/N  3.2.14  268 266 148 240  Lantus   30  Lispro  11+3 11+3 11+0 2    3.3.14  264 341  Lantus  Lispro  +3    A/P:  Katherine Church is a 47 year old female with uncontrolled DM2, HTN, HLD, ulcerative colitis and back pain. She is admitted for treatment of R pre-auricular cellulitis.    TDD insulin yesterday 71 units. Recommend increasing basal and bolus insulins accordingly.    Pt with reasonable progression with DM education - able to monitor glucose, self inject insulin. Does not consistently eat 3 meals/day so is not currently a candidate for mixed dose insulin as outpt and is becoming overwhelmed with education on correctional insulin. Continues to decline trial of metformin. Recommend simplifying multiple daily injections to basal and prandial and having pt f/u as scheduled below for ongoing titration of regimen.    Pt did eat < 50% of breakfast this morning, but 2 Carb Servings, therefore pt pre-lunch remains elevated, repaged inpt insulin recommendations.     Inpatient Recs: paged first call x 2 with recommendations  - Increase Lantus 30 ->42 units daily , add lantus 12 units one time now as pt has  already received her lantus 30 units today.  - Increase Lispro 6 -> 14 units qAC  - Increase to Lispro High Correction ACHS      Outpatient Recs/Education:    1. Self-monitor blood glucose: reviewed pre-meal and HbA1C goals and complications of long term uncontrolled hyperglycemia and need for tid ac glucose testing at d/c.    2. Healthy Eating: reviewed effect of CHO on glucose and recommendations for a consistent CHO limited diet and how to read labels/interpret inpt menu as teaching tool. Also discussed how to occasionally work in pt "sweet" preferences and still maintain relative controlled diet    3. Activity: as tolerated    4. Hypoglycemia: reviewed s/sx and treatment by rule of 15s and notification of PCP for 2 episodes glucose < 70 mg/dL in a week for reevaluation of regimen    5. Medication: discussed mxn action basal/bolus insulin and SU. Pt with return demonstration insulin injection, able to respond appropriately to basal and prandial dosing given scenario. Pt becomes confused when adding correction scale to teaching.    6. Discharge regimen:   - Lantus 36 units daily in the morning  - Lispro 12 units tid AC for a meal of 3-4 CHO servings - do  not sent pt on correction scale at this time  - Glucose Meter and test strips lancets #100 for 3xdaily injections for pt with uncontrolled DM on insulin  - Insulin syringes #200 for 4 x daily injection    7. Follow-up: with [facility] March 17th at 10 am with glucose meter, log book and hospital discharge instructions for ongoing glucose management. Pt will also be followed by [facility] nurse to monitor transition to home.      will cont to follow

## 2012-10-11 NOTE — Plan of Care (Cosign Needed)
Problem: Pain - Acute  Goal: Control of acute pain  Outcome: Met  Pt adequately able to communicate pain level using verbal 0-10 scale. During AM rounds pt reported R cheek pain 8/10.  Pain meds administered, please see MAR.  Pain reassessed 2 hours later, pt reported pain level 7/10.  Pt now resting in bed watching TV.

## 2012-10-11 NOTE — Plan of Care (Signed)
Problem: Pain - Acute  Goal: Control of acute pain  Outcome: Met  Pain assessed, medicated as ordered,  Comfort  Measures rendered. W/ relief after pain meds was given. pls refer to [facility] for meds given. Instructed to call if pain increase. Pt verbalized understanding, will cont to monitor,goal ongoing.         Problem: Infection  Goal: Absence of infection signs and symptoms  Outcome: Met  Pt is afebrile, standard precautions observed. Incision site is c/d/i. Wound dressing changed, teach back method done. Pt verbalized understanding. No active s/s of infection noted. Will cont to monitor,goal ongoing.    Problem: Alteration in Blood Glucose  Goal: Glucose level within specified parameters  Outcome: Not Met  Blood sugar checked, insulin given  Per sliding scale. Endocrinology came to talk to pt. pls refer to her notes.  No s/s of hypoglycemia noted. Instructed to report for changes in status. Pt verbalized understanding. Will cont to monitor,goal ongoing.    Problem: Falls - Risk of  Goal: Absence of falls  Outcome: Met  No fall/injury noted for this shift, safety precautions observed, enc to call for assistance. Pt verbalized understanding, goal ongoing.    Problem: Discharge Planning  Goal: Participation in care planning  Outcome: Met  Pt discharge to home.

## 2012-10-12 LAB — BLOOD CULTURE
Blood Culture: NO GROWTH
Blood Culture: NO GROWTH

## 2012-10-27 ENCOUNTER — Ambulatory Visit (HOSPITAL_BASED_OUTPATIENT_CLINIC_OR_DEPARTMENT_OTHER): Payer: MEDICAID | Admitting: Otolaryngology

## 2012-12-21 ENCOUNTER — Ambulatory Visit (HOSPITAL_BASED_OUTPATIENT_CLINIC_OR_DEPARTMENT_OTHER): Payer: MEDICAID | Admitting: Otolaryngology

## 2013-01-21 ENCOUNTER — Ambulatory Visit (HOSPITAL_BASED_OUTPATIENT_CLINIC_OR_DEPARTMENT_OTHER): Payer: MEDICAID | Admitting: Otolaryngology

## 2013-05-12 ENCOUNTER — Emergency Department
Admission: EM | Admit: 2013-05-12 | Discharge: 2013-05-12 | Disposition: A | Discharge status: Home / Self Care | Payer: Medicaid Other | Attending: Emergency Medicine | Admitting: Emergency Medicine

## 2013-05-12 DIAGNOSIS — R030 Elevated blood-pressure reading, without diagnosis of hypertension: Secondary | ICD-10-CM | POA: Insufficient documentation

## 2013-05-12 DIAGNOSIS — E119 Type 2 diabetes mellitus without complications: Secondary | ICD-10-CM | POA: Insufficient documentation

## 2013-05-12 DIAGNOSIS — K921 Melena: Secondary | ICD-10-CM | POA: Insufficient documentation

## 2013-05-12 DIAGNOSIS — R112 Nausea with vomiting, unspecified: Secondary | ICD-10-CM | POA: Insufficient documentation

## 2013-05-12 DIAGNOSIS — R1031 Right lower quadrant pain: Secondary | ICD-10-CM | POA: Insufficient documentation

## 2013-05-12 DIAGNOSIS — R109 Unspecified abdominal pain: Secondary | ICD-10-CM

## 2013-05-12 DIAGNOSIS — Z8719 Personal history of other diseases of the digestive system: Secondary | ICD-10-CM | POA: Insufficient documentation

## 2013-05-12 LAB — URINALYSIS
Nitrite: NEGATIVE
pH: 6.5 (ref 5.0–8.0)

## 2013-05-12 MED ORDER — HYDROMORPHONE HCL 1 MG/ML IJ SOLN
1.00 mg | Freq: Once | INTRAMUSCULAR | Status: AC
Start: 2013-05-12 — End: 2013-05-12
  Administered 2013-05-12: 1 mg via INTRAVENOUS
  Filled 2013-05-12: qty 1

## 2013-05-12 MED ORDER — SODIUM CHLORIDE 0.9 % IV BOLUS
1000.00 mL | INJECTION | Freq: Once | INTRAVENOUS | Status: AC
Start: 2013-05-12 — End: 2013-05-12
  Administered 2013-05-12: 1000 mL via INTRAVENOUS

## 2013-05-12 MED ORDER — OXYCODONE-ACETAMINOPHEN 5-325 MG OR TABS
1.0000 | ORAL_TABLET | Freq: Four times a day (QID) | ORAL | Status: DC | PRN
Start: 2013-05-12 — End: 2015-01-05

## 2013-05-12 MED ORDER — ONDANSETRON HCL 4 MG/2ML IV SOLN
4.00 mg | Freq: Once | INTRAMUSCULAR | Status: AC
Start: 2013-05-12 — End: 2013-05-12
  Administered 2013-05-12: 4 mg via INTRAVENOUS
  Filled 2013-05-12: qty 2

## 2013-05-12 MED ORDER — OXYCODONE-ACETAMINOPHEN 5-325 MG OR TABS
1.00 | ORAL_TABLET | Freq: Once | ORAL | Status: AC
Start: 2013-05-12 — End: 2013-05-12
  Administered 2013-05-12: 1 via ORAL
  Filled 2013-05-12: qty 1

## 2013-05-12 MED ORDER — ONDANSETRON HCL 4 MG/2ML IV SOLN
INTRAMUSCULAR | Status: AC
Start: 2013-05-12 — End: 2013-05-12
  Administered 2013-05-12: 4 mg via INTRAVENOUS
  Filled 2013-05-12: qty 2

## 2013-05-12 MED ORDER — HYDROMORPHONE HCL 1 MG/ML IJ SOLN
1.00 mg | Freq: Once | INTRAMUSCULAR | Status: AC
Start: 2013-05-12 — End: 2013-05-12

## 2013-05-12 MED ORDER — ONDANSETRON HCL 4 MG/2ML IV SOLN
4.00 mg | Freq: Once | INTRAMUSCULAR | Status: AC
Start: 2013-05-12 — End: 2013-05-12

## 2013-05-12 MED ORDER — MAGNESIUM SULFATE 40 MG/ML IJ SOLN
2.00 g | Freq: Once | INTRAMUSCULAR | Status: AC
Start: 2013-05-12 — End: 2013-05-12
  Administered 2013-05-12: 2 g via INTRAVENOUS
  Filled 2013-05-12: qty 50

## 2013-05-12 MED ORDER — ONDANSETRON 4 MG OR TBDP
4.0000 mg | ORAL_TABLET | Freq: Three times a day (TID) | ORAL | Status: DC | PRN
Start: 2013-05-12 — End: 2015-01-05

## 2013-05-12 MED ORDER — HYDROMORPHONE HCL 1 MG/ML IJ SOLN
INTRAMUSCULAR | Status: AC
Start: 2013-05-12 — End: 2013-05-12
  Administered 2013-05-12: 1 mg via INTRAVENOUS
  Filled 2013-05-12: qty 1

## 2013-05-12 NOTE — ED Provider Notes (Signed)
 Emergency Dept Provider Note    Chief Complaint:   Chief Complaint   Patient presents with   . Abdominal Pain     lower abdominal pain x 4 days. +n/v/d.  red stools. history of ulcerative colitis       HPI:  Katherine Church is a 47 year old  female with PMH significant for DM, recurrent colitis (told possibly ulcerative colitis vs ischemic in the past) who presents with four days of bilateral lower quadrant abdominal pain, now severe, non-radiating, cramping in nature and n/v/d, however began having BRBPR since this morning which she noted as "sediment in the toilet and on the toilet paper." States bleeding was concerning to her but has not occurred since this episode this morning and did not feel like a heavy flow of blood at the time. She denies any f/c/cp/sob. No hematemesis. Reports in May of this year she was seen at [facility] where she was diagnosed with colitis vs diverticulitis and treated with antibiotics. She states her colonoscopies in the past have been inconclusive, last time in our hospital was 04/2011 which showed:     A: Colon, right, biopsy   -Focal minimal acute colitis, see comment.   B: Colon, 30 to 35 cm, biopsy   -Benign colonic mucosa with focal minimal acute colitis and lamina   propria fibrosis, see comment.   C: Rectosigmoid colon, biopsy   -Focal mild acute colitis, see comment.      Medications:  Patient's Medications   New Prescriptions    No medications on file   Previous Medications    AMLODIPINE  (NORVASC ) 10 MG TABLET    Take 1 tablet by mouth daily.    AMOXICILLIN -CLAVULANATE (AUGMENTIN ) 875-125 MG PER TABLET    Take 1 tablet by mouth every 12 hours.    ATORVASTATIN  (LIPITOR) 20 MG TABLET    Take 1 tablet by mouth daily.    BLOOD GLUCOSE METER    Use as directed.    DIAZEPAM  (VALIUM ) 5 MG TABLET    Take 5 mg by mouth every 12 hours as needed.    DICYCLOMINE  (BENTYL ) 10 MG CAPSULE    Take 1 capsule by mouth 3 times daily (before meals).    DOCUSATE SODIUM   (COLACE) 250 MG CAPSULE    Take 1 capsule by mouth 2 times daily.    DOCUSATE SODIUM  (COLACE) 250 MG CAPSULE    Take 1 capsule by mouth 2 times daily.    GAUZE PADS & DRESSINGS (CURITY IODOFORM PACKING STRIP) MISC    Apply 1 bottle topically daily.    GAUZE PADS & DRESSINGS (GAUZE DRESSING) 4"X4" PADS    Apply 1 Application topically daily.    GLUCOSE BLOOD TEST STRIP    1 strip by Other route 4 times daily (before meals and nightly).    HYDROMORPHONE  (DILAUDID ) 2 MG TABLET    Take 1.5 tablets by mouth every 4 hours as needed for Moderate Pain (Pain Score 4-6) or Severe Pain (Pain Score 7-10).    INSULIN  GLARGINE (LANTUS ) 100 UNIT/ML INJECTION    Inject 36 Units under the skin nightly.    INSULIN  LISPRO (HUMALOG ) 100 UNIT/ML INJECTION    Inject 12 Units under the skin 3 times daily (before meals).    INSULIN  SYRINGE-NEEDLE U-100 31G X 5/16" 0.3 ML MISC    Use one syringe with each insulin  administration.    LANCETS    1 Lancet by Other route 4 times daily (before meals and nightly).  LANSOPRAZOLE  (PREVACID  SOLUTAB) 30 MG DISINTEGRATING TABLET    Take 1 tablet by mouth daily.    LISINOPRIL  (PRINIVIL , ZESTRIL ) 40 MG TABLET    Take 1 tablet by mouth 2 times daily.    LISINOPRIL  (PRINIVIL , ZESTRIL ) 40 MG TABLET    Take 1 tablet by mouth 2 times daily.    OMEPRAZOLE  (PRILOSEC) 20 MG CAPSULE    Take 20 mg by mouth daily.    ONDANSETRON  (ZOFRAN  ODT) 4 MG DISINTEGRATING TABLET    Take 1 tablet by mouth every 6 hours as needed for Nausea.    SENNA (SENOKOT) 8.6 MG TABLET    Take 1 tablet by mouth daily.    SENNA (SENOKOT) 8.6 MG TABLET    Take 1 tablet by mouth daily.   Modified Medications    No medications on file   Discontinued Medications    No medications on file       Allergies:   Ciprofloxacin ; Flagyl ; and Tetanus toxoids    Past Medical History:   Past Medical History   Diagnosis Date   . Back pain    . High blood pressure    . Diabetes mellitus    . Hyperlipidemia LDL goal < 100    . Colitis, ulcerative               Past Surgical History:   Past Surgical History   Procedure Laterality Date   . Pb anesth,tubal ligation/transection  1994   . Colonoscopy  04/2011     Repeat surveillance colo in 2019       Family History:   Family History   Problem Relation Age of Onset   . Diabetes Father    . Lipids Father    . Hypertension Father    . Hypertension Mother        Social History:   History     Social History   . Marital Status: Single     Spouse Name: N/A     Number of Children: 2   . Years of Education: N/A     Occupational History   . unemployed      Social History Main Topics   . Smoking status: Never Smoker    . Smokeless tobacco: Never Used   . Alcohol Use: No   . Drug Use: No      Comment: remote history of cocaine.  Currently occasional MJ smoking   . Sexual Activity: Yes     Partners: Male      Comment: tubal ligation     Other Topics Concern   . Blood Transfusions No   . Special Diet No   . Exercise Yes     tries to walk     Social History Narrative    Two kids-adult in [place]  (going thru med school [place]); son still deciding.    Born and raised in [place] .    Moved to [place] in august 2010-boyfriend works for [employer].     ROS:  As per HPI, unless noted below   General: No fever, chills.  Skin: No pruritis, rashes, lesions.  HEENT: No headache, visual changes, dizziness, sore throat.  CV: No chest pain, palpitations.  Resp: No SOB, cough.  GI: Nausea, vomiting, right worse than left abdominal cramping pain, loose/diarrhea stools 2-3/day for 4 days, with BRBPR X1 today as per HPI.   GU: No frequency, dysuria.  Ext: No muscle pain, joint stiffness, edema.  Neuro: No weakness, numbness, tingling, problems thinking.  Physical exam  Vital signs reviewed and noted - Temperature:  [98.5 F (36.9 C)] 98.5 F (36.9 C) (10/02 0843)  Blood pressure (BP): (159)/(114) 159/114 mmHg (10/02 0843)  Heart Rate:  [85] 85 (10/02 0843)  Respirations:  [20] 20 (10/02 0843)  Pain Score:  [-] 8 (10/02 0843)  O2 Device:  [-]    SpO2:  [99 %] 99 % (10/02 0843)    Gen: Patient is in mild to moderate distress, holding her abdomen, A&OX4, responding to questions appropriately with full sentences.   HEENT: EOMI, PERRL. No icterus, ptosis. Oropharynx w/out exudates, erythema. Moist mucous membranes.  Neck: Supple, no JVD, no LAD.  Lungs: CTAB. No wheezes, crackles or rhonchi.  CV: RRR. Normal S1, S2. No murmurs appreciated.  Abdomen: Normoactive bowel sounds.Tenderness to palpation in BLQ worse in Right, no rebound or guarding. No masses, organomegaly.  Rectal: Normal tone, no anal fissures, hemorrhoids noted, no active bleeding. Trace hemoccult positive dark brown stool.   Back: No CVA tenderness.  Extremities: No cyanosis, edema. Warm and well perfused   Neurologic: Mentation appropriate. Gait normal.  5/5 strength and sensation intact to light touch in all extremities.     LABS  Results for orders placed during the hospital encounter of 05/12/13   BASIC METABOLIC PANEL, BLOOD       Result Value Range    Glucose 369 (*) 70 - 115 mg/dL    BUN 9  6 - 20 mg/dL    Creatinine 1.30 (*) 0.51 - 0.95 mg/dL    GFR >86      Sodium 133 (*) 136 - 145 mmol/L    Potassium 3.8  3.5 - 5.1 mmol/L    Chloride 97 (*) 98 - 107 mmol/L    Bicarbonate 20 (*) 22 - 29 mmol/L    Calcium  9.7  8.6 - 10.0 mg/dL   PHOSPHORUS, BLOOD       Result Value Range    Phosphorous 3.5  2.7 - 4.5 mg/dL   MAGNESIUM , BLOOD       Result Value Range    Magnesium  1.7  1.7 - 2.6 mg/dL   LIVER PANEL, BLOOD       Result Value Range    Total Protein 7.7  6.0 - 8.0 g/dL    Albumin 4.4  3.5 - 5.2 g/dL    Bilirubin, Dir 0.1  <0.2 mg/dL    Bilirubin, Tot 0.4  <1.2 mg/dL    AST (SGOT) 20  0 - 32 U/L    ALT (SGPT) 36 (*) 0 - 33 U/L    Alkaline Phos 111  35 - 140 U/L   LIPASE, BLOOD       Result Value Range    Lipase 59  13 - 60 U/L   CBC WITH ADIFF, BLOOD       Result Value Range    WBC 7.3  4.0 - 10.0 1000/mm3    RBC 4.65  3.90 - 5.20 mill/mm3    Hgb 13.8  11.2 - 15.7 gm/dL    Hct 57.8  46.9 -  62.9 %    MCV 84.3  79.0 - 95.0 um3    MCH 29.7  26.0 - 32.0 pgm    MCHC 35.2  32.0 - 36.0 %    RDW 13.4  12.0 - 14.0 %    MPV 9.5  9.4 - 12.4 fL    Plt Count 210  140 - 370 1000/mm3    Segs 64  34 - 71 %  Lymphocytes 30  19 - 53 %    Monocytes 5  5 - 12 %    Eosinophils 2  1 - 7 %    ANC-Automated 4.7  1.6 - 7.0 1000/mm3    Abs Lymphs 2.2  0.8 - 3.1 1000/mm3    Abs Monos 0.3  0.2 - 0.8 1000/mm3    Abs Eosinophils 0.1  0.0 - 0.5 1000/mm3    Diff Type Automated     URINALYSIS       Result Value Range    Type Not Specified      Color Yellow  Yellow    Appearance Clear  Clear    Specific Gravity 1.038 (*) 1.002 - 1.030    pH 6.5  5.0 - 8.0    Protein Trace (*) Negative    Glucose 4+ (*) Negative    Ketones Small (*) Negative    Bilirubin Negative  Negative    Blood Negative  Negative    Urobilinogen 0.2-1.0  0.2-1 EU/dL    Nitrite Negative  Negative    Leuk Esterase Trace (*) Negative    WBC 6-10 (*) 0-2/HPF    RBC 0-2  0-2/HPF    Bacteria Rare  None-Rare/HPF    Squam. Epithelial Cell Moderate (*) 0-Few/HPF    Mucus Rare  None-Rare/HPF    Garland Junk. Epithelial Cell Rare  None/HPF   APTT, BLOOD       Result Value Range    PTT 31.1  25.0 - 34.0 sec   PROTHROMBIN TIME, BLOOD       Result Value Range    PT,Patient 10.3  9.7 - 12.5 sec    INR 1.0     LACTATE, BLOOD       Result Value Range    Lactate 18.0  4.5 - 19.8 mg/dL   GLUCOSE (POCT)       Result Value Range    Glucose (POCT) 282 (*) 70 - 115 mg/dL   CBC WITH ADIFF, BLOOD       Result Value Range    WBC 8.0  4.0 - 10.0 1000/mm3    RBC 4.16  3.90 - 5.20 mill/mm3    Hgb 12.4  11.2 - 15.7 gm/dL    Hct 45.4  09.8 - 11.9 %    MCV 85.3  79.0 - 95.0 um3    MCH 29.8  26.0 - 32.0 pgm    MCHC 34.9  32.0 - 36.0 %    RDW 13.4  12.0 - 14.0 %    MPV 9.5  9.4 - 12.4 fL    Plt Count 192  140 - 370 1000/mm3    Segs 54  34 - 71 %    Lymphocytes 39  19 - 53 %    Monocytes 5  5 - 12 %    Eosinophils 2  1 - 7 %    ANC-Automated 4.3  1.6 - 7.0 1000/mm3    Abs Lymphs 3.1  0.8 - 3.1 1000/mm3     Abs Monos 0.4  0.2 - 0.8 1000/mm3    Abs Eosinophils 0.2  0.0 - 0.5 1000/mm3    Diff Type Automated     URINE CULTURE       Result Value Range    Urine Culture Result No Growth     C-REACTIVE PROTEIN, BLOOD       Result Value Range    CRP 1.4 (*) <0.5 mg/dL   SED RATE, BLOOD  Result Value Range    Sed Rate 15  0 - 20 mm/hr       DIAGNOSTIC STUDIES  Ct Abdomen And Pelvis With Contrast    05/12/2013    EXAM DESCRIPTION:  CT ABDOMEN AND PELVIS WITH CONTRAST  CLINICAL HISTORY: Reports right lower quadrant pain with bright red blood per rectum. History of  colitis and diverticulitis.  TECHNIQUE: COVERAGE: Abdomen and Pelvis IV CONTRAST: 100 ml Omnipaque 350 PHASE(S) ACQUIRED: Portal venous ORAL CONTRAST GIVEN: No ADVERSE EVENTS: None RECONSTRUCTIONS: Axial 3.58mm and sagittal/coronal 3mm    Up-to-date CT equipment and radiation dose reduction techniques were  employed. CTDIvol: 12.5. DLP: 640.6 mGy-cm.  COMPARISON: CT of the abdomen pelvis on 10/26/2011  FINDINGS: CT ABDOMEN  LUNG BASES: Unremarkable  LIVER/BILIARY: Unremarkable  PANCREAS: Unremarkable  SPLEEN: Unremarkable  ADRENAL GLANDS: Unremarkable  KIDNEYS: Unremarkable  STOMACH/DUODENUM: Unremarkable  VASCULATURE: Unremarkable  LYMPHATIC: Unremarkable    CT PELVIS  SMALL & LARGE BOWEL: Diverticulosis without evidence of diverticulitis.  BLADDER/PELVIC ORGANS: Unremarkable  BONES/SOFT TISSUES: Unremarkable  OTHER: None  IMPRESSION: CT scan with IV contrast  1. Unremarkable CT scan of the abdomen and pelvis.  2. Diverticulosis without evidence of diverticulitis.  CONCURRENT SUPERVISION: I have reviewed the images and agree with the resident interpretation.  DOSE STATEMENT: [facility] CT scanners employ modern techniques for CT dose  reduction, including protocol review, automatic exposure control, and  iterative reconstruction techniques.  These features assure that radiation  dose levels in CT are optimized and are consistent with  state-of-the-art, low  dose CT practice.      Assessment and Clinical Decision-making/ED Course  This is a 47 year old  female that presents with n/v/d for 4 days and BRBPR X1 prior to arrival in the context a patient with similar pain and symptoms in the past and diagnosed with colitis vs diverticulitis possibly at OSH. Initial hg 13.8 repeated 4 hours later with drop to 12.4, but patient received 1 L IVF, probable hemodilution. Patient monitored in ED for 9 hours without further bowel movements, nausea, vomiting. CT scan negative for diverticulitis but positive for diverticulosis. Possible colitis vs diverticulosis bleeding, however appears stable currently. Discussed course with patient who wishes to return home now that her symptoms are improved and pursue rapid PCM and GI follow-up. Strict return parameters reviewed with the patient who repeated back understanding and agrees with the plan.       Disposition: Home with PCM and GI follow-up.       Patient discussed with attending, Dr. Artemio Larry, before final recommendations are made.           Lorette Ronco, MD  Resident  05/15/13 [phone]

## 2013-05-12 NOTE — Discharge Instructions (Signed)
 Take the percocet (pain medication) and zofran  (anti-nausea medication) as prescribed for pain and nausea. Follow up with the gastroenterology referral at their next available appointment. If your symptoms worsen, you develop new symptoms such as fever, or if you have any other concerns please return promptly to the Emergency Department.       Abdominal Pain    You have been diagnosed with abdominal (belly) pain. The cause of your pain is not yet known.    Many things can cause abdominal pain. Examples include viral infections and bowel (intestine) spasms. You might need another examination or more tests to find out why you have pain.    At this time, your pain does not seem to be caused by anything dangerous. You do not need surgery. You do not need to stay in the hospital.     Though we don't believe your condition is dangerous right now, it is important to be careful. Sometimes a problem that seems mild can become serious later. This is why it is very important that you return here or go to the nearest Emergency Department unless you are 100% improved.    Return here or go to the nearest Emergency Department, or follow up with your physician in:   24 hours.    Drink only clear liquids such as water , clear broth, sports drinks, or clear caffeine-free soft drinks, like 7-Up or Sprite, for the next:   24 hours.    YOU SHOULD SEEK MEDICAL ATTENTION IMMEDIATELY, EITHER HERE OR AT THE NEAREST EMERGENCY DEPARTMENT, IF ANY OF THE FOLLOWING OCCURS:   Your pain does not go away or gets worse.   You cannot keep fluids down or your vomit is dark green.    You vomit blood or see blood in your stool. Blood might be bright red or dark red. It can also be black and look like tar.   You have a fever or shaking chills.   Your skin or eyes look yellow or your urine looks brown.   You have severe diarrhea.

## 2013-05-12 NOTE — ED Attending Note (Addendum)
 ED ATTENDING NOTE:    The patient was seen and examined with the resident, Dr. Wilkie Harding.  I have reviewed the history and physical exam, and I have discussed the patient's care plan with the resident.      The key elements of the history are the patient is a 47 yo female with a pmhx sig for colitis (ulcerative vs ischemic) and diverticulosis who presents with lower abdominal pain for the past 4 days as well as n/v/d.  She reports passing bright red blood per rectum today (she actually brought in some blood clots that she passed).  Pain is worse on the right side of her abdomen. Pain is similar to her previous episodes of abdominal pain where she was diagnosed with colitis, this time it just seems worse.  No hematemesis. No dysuria or hematuria.  No cp or sob. No f/ch.  She states she can not take steroids because it makes her bp go too high.     All/meds: reviewed and confirmed  Pmhx: colitis, diverticulosis, htn, dm  Shx: no tobacco use; not homeless  ROS: all systems were reviewed and were negative except as stated above in the HPI.    PE:  vital signs noted. Afebrile.  bp is elevated. O2 sats are 99% on room air and that is normal.  Wdwn female presents in pain but nad  Heent: ncat  Eyes: perrl. Sclera are anicteric. Conjunctiva are normal bilaterally.   Op: clear. No erythema or exudates. Moist mm  Neck: no la or tm. No meningeal signs.   Lungs: cta b. no tachypnea  Back: no cvat. No spinal ttp.   CV: nr. Rr. No m/g/c/r  Abd: nabs. Soft. ttp in the rlq>llq. No rebound or guarding. Nd. No hsm. No masses  Ext: no edema  Rectal: per Dr. Wilkie Harding  Neuro: awake and alert. Ox4. maew. Normal gait    a/p: 1. Abdominal pain, rlq>llq  2. Hematochezia  ddx includes diverticulitis, diverticulosis, crohn's, uc, gastroenteritis.   Check labs. Review prior medical records form our institution. Try to obtain medical records from outside hospital where she had her most recent abd CT (12/2012).  Treat with IV fluids, pain medications, and  antiemetics.  Work-up as indicated.  The assessment and plan of the resident were reviewed and I agree with the plan of Dr. Wilkie Harding.

## 2013-05-12 NOTE — ED Notes (Signed)
Repeat cbc sent

## 2013-05-12 NOTE — ED Notes (Signed)
 SO Note:  Chief Complaint   Patient presents with   . Abdominal Pain     lower abdominal pain x 4 days. +n/v/d.  red stools. history of ulcerative colitis       Temperature:  [98.5 F (36.9 C)] 98.5 F (36.9 C) (10/02 0843)  Blood pressure (BP): (120-159)/(73-114) 120/73 mmHg (10/02 1112)  Heart Rate:  [70-85] 70 (10/02 1112)  Respirations:  [18-20] 18 (10/02 1112)  Pain Score:  [-] 5 (10/02 1112)  O2 Device:  [-]   SpO2:  [97 %-99 %] 97 % (10/02 9869)    47 year old ho uc pw 4d abd pain, nv, brb this am, currently no active bleed.  Pending ct abd, po trial, fu w gi if passes po and has neg ct abd.              Elson Halon, MD  05/12/13 1443

## 2013-05-12 NOTE — ED Notes (Signed)
Pt to ct

## 2013-05-12 NOTE — ED Notes (Signed)
 Assisting primary rn. fsbg done. poc pregnancy done and ua sent

## 2013-05-17 NOTE — ED Follow-up Note (Signed)
 Follow-up type: Callback       Routine ED Patient Call Back    Patient contacted by telephone,'' Im hanging in there. I have follow up tomorrow. Thank you for the call.''

## 2013-06-27 ENCOUNTER — Telehealth (INDEPENDENT_AMBULATORY_CARE_PROVIDER_SITE_OTHER): Payer: Self-pay | Admitting: Internal Medicine

## 2013-06-29 ENCOUNTER — Emergency Department (HOSPITAL_COMMUNITY): Payer: Medicaid Other

## 2013-06-29 ENCOUNTER — Emergency Department
Admission: EM | Admit: 2013-06-29 | Discharge: 2013-06-29 | Disposition: A | Discharge status: Home / Self Care | Payer: Medicaid Other | Attending: Emergency Medicine | Admitting: Emergency Medicine

## 2013-06-29 DIAGNOSIS — I10 Essential (primary) hypertension: Secondary | ICD-10-CM | POA: Insufficient documentation

## 2013-06-29 DIAGNOSIS — R109 Unspecified abdominal pain: Secondary | ICD-10-CM | POA: Insufficient documentation

## 2013-06-29 DIAGNOSIS — N760 Acute vaginitis: Secondary | ICD-10-CM | POA: Insufficient documentation

## 2013-06-29 DIAGNOSIS — R350 Frequency of micturition: Secondary | ICD-10-CM | POA: Insufficient documentation

## 2013-06-29 DIAGNOSIS — E119 Type 2 diabetes mellitus without complications: Secondary | ICD-10-CM | POA: Insufficient documentation

## 2013-06-29 DIAGNOSIS — R300 Dysuria: Secondary | ICD-10-CM | POA: Insufficient documentation

## 2013-06-29 DIAGNOSIS — B9689 Other specified bacterial agents as the cause of diseases classified elsewhere: Secondary | ICD-10-CM | POA: Insufficient documentation

## 2013-06-29 LAB — URINALYSIS
Nitrite: NEGATIVE
pH: 7 (ref 5.0–8.0)

## 2013-06-29 MED ORDER — PROCHLORPERAZINE EDISYLATE 5 MG/ML IJ SOLN
INTRAMUSCULAR | Status: DC
Start: 2013-06-29 — End: 2013-06-29
  Filled 2013-06-29: qty 2

## 2013-06-29 MED ORDER — POLYETHYLENE GLYCOL 3350 OR PACK
17.00 g | PACK | Freq: Every day | ORAL | Status: AC
Start: 2013-06-29 — End: ?

## 2013-06-29 MED ORDER — HYDROMORPHONE HCL 1 MG/ML IJ SOLN
1.00 mg | Freq: Once | INTRAMUSCULAR | Status: AC
Start: 2013-06-29 — End: 2013-06-29
  Administered 2013-06-29: 1 mg via INTRAVENOUS

## 2013-06-29 MED ORDER — HYDROMORPHONE HCL 1 MG/ML IJ SOLN
INTRAMUSCULAR | Status: DC
Start: 2013-06-29 — End: 2013-06-29
  Filled 2013-06-29: qty 1

## 2013-06-29 MED ORDER — CLINDAMYCIN HCL 150 MG OR CAPS
300.00 mg | ORAL_CAPSULE | Freq: Once | ORAL | Status: AC
Start: 2013-06-29 — End: 2013-06-29
  Administered 2013-06-29: 300 mg via ORAL
  Filled 2013-06-29: qty 2

## 2013-06-29 MED ORDER — PROCHLORPERAZINE EDISYLATE 5 MG/ML IJ SOLN
10.00 mg | Freq: Once | INTRAMUSCULAR | Status: AC
Start: 2013-06-29 — End: 2013-06-29
  Administered 2013-06-29: 10 mg via INTRAVENOUS

## 2013-06-29 MED ORDER — CLINDAMYCIN HCL 300 MG OR CAPS
300.00 mg | ORAL_CAPSULE | Freq: Three times a day (TID) | ORAL | Status: AC
Start: 2013-06-29 — End: 2013-07-06

## 2013-06-29 MED ORDER — HYDROCODONE-ACETAMINOPHEN 5-325 MG OR TABS
1.00 | ORAL_TABLET | Freq: Once | ORAL | Status: AC
Start: 2013-06-29 — End: 2013-06-29
  Administered 2013-06-29: 1 via ORAL
  Filled 2013-06-29: qty 1

## 2013-06-29 MED ORDER — HYDROCODONE-ACETAMINOPHEN 5-325 MG OR TABS
1.0000 | ORAL_TABLET | Freq: Four times a day (QID) | ORAL | Status: DC | PRN
Start: 2013-06-29 — End: 2015-01-05

## 2013-06-29 MED ORDER — SODIUM CHLORIDE 0.9 % IV BOLUS
1000.00 mL | INJECTION | Freq: Once | INTRAVENOUS | Status: AC
Start: 2013-06-29 — End: 2013-06-29
  Administered 2013-06-29: 1000 mL via INTRAVENOUS

## 2013-06-29 MED ORDER — HYDROMORPHONE HCL 1 MG/ML IJ SOLN
1.00 mg | Freq: Once | INTRAMUSCULAR | Status: AC
Start: 2013-06-29 — End: 2013-06-29
  Administered 2013-06-29: 1 mg via INTRAVENOUS
  Filled 2013-06-29: qty 1

## 2013-06-29 NOTE — ED Provider Notes (Signed)
 Katherine Church    MRN: [id number]    DOB: 21-Jun-1966    CC: Abdominal Pain      HPI: Katherine Church is a 47 year old female with pmh of possible UC v. Diverticulitis, HLP, HTN, DM who presents to ED with progressively worsening right>left flank pain with associated dysuria/increased frequency of urination but no gross blood x 3 weeks. Pt also notes subjective fevers/chills, nausea, and clear emesis. Pt unable to tolerate PO fluids. Pt states she has had a kidney infection in the past. She reports a horse kicked her right kidney when she was younger and has had a nuclear study done in the past which showed slower flow thru her right kidney as a result. Pt also notes having red jelly like stools which has been chronic, her first GI appointment is this Friday. She was seen in the ED a month ago for that complaint, CT done at that time showed evidence of diverticulosis. Additionally, pt describes having 2 weeks of increased clear/white vaginal discharge and itchiness but she states she is not sexually active. Pt notes she has been told she has ovarian cysts in 2011 and is scheduled to have a pelvic ultrasound at the end of the month. Denies vision change, chest pain, shortness of breath, cough, abdominal pain, diarrhea, hematuria, hematochezia, rash.      PMH:   Past Medical History   Diagnosis Date   . Back pain    . High blood pressure    . Diabetes mellitus    . Hyperlipidemia LDL goal < 100    . Colitis, ulcerative     (past medical history)    PSH:   Past Surgical History   Procedure Laterality Date   . Pb anesth,tubal ligation/transection  1994   . Colonoscopy  04/2011     Repeat surveillance colo in 2019       Current Facility-Administered Medications   Medication   . HYDROmorphone  (DILAUDID ) 1 MG/ML injection  - ADS OVERRIDE   . prochlorperazine  (COMPAZINE ) 5 MG/ML injection  - ADS OVERRIDE     Current Outpatient Prescriptions   Medication Sig   . [DISCONTINUED] amLODIPINE  (NORVASC ) 10 MG tablet Take 1  tablet by mouth daily.   . amoxicillin -clavulanate (AUGMENTIN ) 875-125 MG per tablet Take 1 tablet by mouth every 12 hours.   . atorvastatin  (LIPITOR) 20 MG tablet Take 1 tablet by mouth daily.   . blood glucose meter Use as directed.   . diazepam  (VALIUM ) 5 MG tablet Take 5 mg by mouth every 12 hours as needed.   . dicyclomine  (BENTYL ) 10 MG capsule Take 1 capsule by mouth 3 times daily (before meals).   . docusate sodium  (COLACE) 250 MG capsule Take 1 capsule by mouth 2 times daily.   . docusate sodium  (COLACE) 250 MG capsule Take 1 capsule by mouth 2 times daily.   . Gauze Pads & Dressings (CURITY IODOFORM PACKING STRIP) MISC Apply 1 bottle topically daily.   . Gauze Pads & Dressings (GAUZE DRESSING) 4X4 PADS Apply 1 Application topically daily.   SABRA glucose blood test strip 1 strip by Other route 4 times daily (before meals and nightly).   . HYDROmorphone  (DILAUDID ) 2 MG tablet Take 1.5 tablets by mouth every 4 hours as needed for Moderate Pain (Pain Score 4-6) or Severe Pain (Pain Score 7-10).   . insulin  glargine (LANTUS ) 100 UNIT/ML injection Inject 36 Units under the skin nightly.   . insulin  lispro (HUMALOG ) 100 UNIT/ML injection  Inject 12 Units under the skin 3 times daily (before meals).   . Insulin  Syringe-Needle U-100 31G X 5/16 0.3 ML MISC Use one syringe with each insulin  administration.   SABRA lancets 1 Lancet by Other route 4 times daily (before meals and nightly).   . lansoprazole  (PREVACID  SOLUTAB) 30 MG disintegrating tablet Take 1 tablet by mouth daily.   . lisinopril  (PRINIVIL , ZESTRIL ) 40 MG tablet Take 1 tablet by mouth 2 times daily.   . lisinopril  (PRINIVIL , ZESTRIL ) 40 MG tablet Take 1 tablet by mouth 2 times daily.   . omeprazole  (PRILOSEC) 20 MG capsule Take 20 mg by mouth daily.   . ondansetron  (ZOFRAN  ODT) 4 MG disintegrating tablet Take 1 tablet by mouth every 8 hours as needed for Nausea.   . ondansetron  (ZOFRAN  ODT) 4 MG disintegrating tablet Take 1 tablet by mouth every 6 hours as  needed for Nausea.   . oxyCODONE -acetaminophen  (PERCOCET) 5-325 MG per tablet Take 1 tablet by mouth every 6 hours as needed for Severe Pain (Pain Score 7-10).   SABRA senna (SENOKOT) 8.6 MG tablet Take 1 tablet by mouth daily.   SABRA senna (SENOKOT) 8.6 MG tablet Take 1 tablet by mouth daily.       Allergies: Ciprofloxacin ; Flagyl ; and Tetanus toxoids    Social History:   History   Substance Use Topics   . Smoking status: Never Smoker    . Smokeless tobacco: Never Used   . Alcohol Use: No       Family History: family history includes Diabetes in her father; Hypertension in her father and mother; Lipids in her father.    Review of Systems: All systems or reviewed as negative except those noted in HPI.    BP 164/115  Pulse 76  Temp(Src) 98.4 F (36.9 C)  Resp 20  Ht 5' 4 (1.626 m)  Wt 73.483 kg (162 lb)  BMI 27.79 kg/m2  SpO2 98%  LMP 06/02/2011    Physical Exam  Gen: Patient is in moderate distress, A&Ox4, behaving appropriately, sitting very still, appears to be in severe pain  HEENT: NC/AT, PERRL. Normal oropharynx w/out exudates or erythema. Moist mucous membranes.  No lymphadenopathy.    Neck: Supple, no JVD, no lymphadenopathy.  Lungs: Normal breath sounds bilaterally. No wheeze/rales/rhonchi    CV: RRR. Normal heart sounds. No murmurs, rubs or gallops appreciated   Abdomen: Normal bowel sounds. NTND. No masses, organomegaly. No peritoneal signs. Voluntary guarding. Right lower quadrant tenderness. guaic negative  Back: Atraumatic. Marked cva tenderness, right flank tenderness  Extremities: No cyanosis, edema. Normal ROM.  Cap refill < 2 sec.   Neurologic: Mentation appropriate. CN II-XII grossly normal. No focal neuro deficits  Skin: No rashes, erythema, lesions.  GU: moderate amount of white vaginal discharge, no odor, not cottage-cheese like. No blood noted. CMT-. R>L adnexal discomfort    LABS  Results for orders placed during the hospital encounter of 06/29/13   BASIC METABOLIC PANEL, BLOOD       Result  Value Range    Glucose 203 (*) 70 - 115 mg/dL    BUN 7  6 - 20 mg/dL    Creatinine 9.47  9.48 - 0.95 mg/dL    GFR >39      Sodium 135 (*) 136 - 145 mmol/L    Potassium 4.4  3.5 - 5.1 mmol/L    Chloride 99  98 - 107 mmol/L    Bicarbonate 23  22 - 29 mmol/L    Calcium   9.5  8.6 - 10.0 mg/dL   CBC WITH ADIFF, BLOOD       Result Value Range    WBC 8.8  4.0 - 10.0 1000/mm3    RBC 4.47  3.90 - 5.20 mill/mm3    Hgb 13.3  11.2 - 15.7 gm/dL    Hct 62.0  65.9 - 54.9 %    MCV 84.8  79.0 - 95.0 um3    MCH 29.8  26.0 - 32.0 pgm    MCHC 35.1  32.0 - 36.0 %    RDW 12.6  12.0 - 14.0 %    MPV 8.9 (*) 9.4 - 12.4 fL    Plt Count 255  140 - 370 1000/mm3    Segs 61  34 - 71 %    Lymphocytes 33  19 - 53 %    Monocytes 4 (*) 5 - 12 %    Eosinophils 2  1 - 7 %    ANC-Automated 5.3  1.6 - 7.0 1000/mm3    Abs Lymphs 2.9  0.8 - 3.1 1000/mm3    Abs Monos 0.3  0.2 - 0.8 1000/mm3    Abs Eosinophils 0.2  0.0 - 0.5 1000/mm3    Diff Type Automated     LIVER PANEL, BLOOD       Result Value Range    Total Protein 7.7  6.0 - 8.0 g/dL    Albumin 4.2  3.5 - 5.2 g/dL    Bilirubin, Dir <9.8  <0.2 mg/dL    Bilirubin, Tot 0.3  <1.2 mg/dL    AST (SGOT) 37 (*) 0 - 32 U/L    ALT (SGPT) 71 (*) 0 - 33 U/L    Alkaline Phos 107  35 - 140 U/L   LIPASE, BLOOD       Result Value Range    Lipase 38  13 - 60 U/L   C-REACTIVE PROTEIN, BLOOD       Result Value Range    CRP 1.3 (*) <0.5 mg/dL       DIAGNOSTIC STUDIES      ED Course  Medications administered:  Medications   HYDROmorphone  (DILAUDID ) 1 MG/ML injection  - ADS OVERRIDE (not administered)   prochlorperazine  (COMPAZINE ) 5 MG/ML injection  - ADS OVERRIDE (not administered)   HYDROcodone -acetaminophen  (NORCO) 5-325 MG tablet 1 tablet (1 tablet Oral Given 06/29/13 1158)   HYDROmorphone  (DILAUDID ) injection 1 mg (1 mg IntraVENOUS Given 06/29/13 1223)   prochlorperazine  (COMPAZINE ) injection 10 mg (10 mg IntraVENOUS Given 06/29/13 1223)        New Prescriptions    No medications on file         MDM/Plan    47 year old  female with pmh of possible UC v. Diverticulitis, HLP, HTN, DM who presents to ED with progressively worsening right>left flank pain with associated dysuria/increased frequency of urination but no gross blood x 3 weeks. Pt is afebrile.  DDx: pyelonephritis, nephrolithiasis, diverticulosis, appendicitis, UC/crohn's, PID, ovarian cyst, ectopic pregnancy, constipation      Plan:   CT abdomen/pelvis noncontrast  IV NS bolus  Compazine  for nausea, dilaudid  for pain    Labs and studies as ordered:  UA normal  Belly labs normal  Normal white count  GC/chlamydia and vaginosis screen sent      Note written in conjunction with [facility] and amended by me as necessary.          Disposition:   Labs wnl, CT pending at time of dc.  No  infection in urine, doubt pyelo.  BV noted on pelvic exam, will treat with clinda for flagyl  allergy.  Care handed off to Dr [facility] pending CT results.  If negative for stone or colitis, anticipate DC with analgesics and close f/u.        Case discussed with Dr. Elberta.        Gregorio Oretha Sheller, MD  Resident  06/29/13 [phone]

## 2013-07-04 NOTE — ED Follow-up Note (Signed)
Follow-up type: Callback       Routine ED Patient Call Back    Patient contacted by telephone, '' Im hanging in there.''

## 2013-12-01 ENCOUNTER — Emergency Department (HOSPITAL_COMMUNITY): Payer: Medicaid Other

## 2013-12-01 ENCOUNTER — Emergency Department
Admission: EM | Admit: 2013-12-01 | Discharge: 2013-12-02 | Disposition: A | Discharge status: Home / Self Care | Payer: Medicaid Other | Attending: Emergency Medicine | Admitting: Emergency Medicine

## 2013-12-01 DIAGNOSIS — I10 Essential (primary) hypertension: Secondary | ICD-10-CM | POA: Insufficient documentation

## 2013-12-01 DIAGNOSIS — K5190 Ulcerative colitis, unspecified, without complications: Secondary | ICD-10-CM | POA: Insufficient documentation

## 2013-12-01 DIAGNOSIS — R112 Nausea with vomiting, unspecified: Secondary | ICD-10-CM | POA: Insufficient documentation

## 2013-12-01 DIAGNOSIS — E119 Type 2 diabetes mellitus without complications: Secondary | ICD-10-CM | POA: Insufficient documentation

## 2013-12-01 DIAGNOSIS — R109 Unspecified abdominal pain: Secondary | ICD-10-CM | POA: Insufficient documentation

## 2013-12-01 LAB — CBC WITH DIFF, BLOOD
ANC-Automated: 5.2 10*3/uL (ref 1.6–7.0)
Abs Eosinophils: 0.2 10*3/uL (ref 0.1–0.5)
Abs Lymphs: 3.4 10*3/uL — ABNORMAL HIGH (ref 0.8–3.1)
Abs Monos: 0.4 10*3/uL (ref 0.2–0.8)
Eosinophils: 2 % (ref 1–4)
Hct: 41.6 % (ref 34.0–45.0)
Hgb: 14.7 gm/dL (ref 11.2–15.7)
Lymphocytes: 37 % (ref 19–53)
MCH: 29.4 pg (ref 26.0–32.0)
MCHC: 35.3 % (ref 32.0–36.0)
MCV: 83.2 um3 (ref 79.0–95.0)
MPV: 9.6 fL (ref 9.4–12.4)
Monocytes: 4 % — ABNORMAL LOW (ref 5–12)
Plt Count: 220 10*3/uL (ref 140–370)
RBC: 5 10*6/uL (ref 3.90–5.20)
RDW: 13 % (ref 12.0–14.0)
Segs: 57 % (ref 34–71)
WBC: 9.2 10*3/uL (ref 4.0–10.0)

## 2013-12-01 LAB — HCG QUANTITATIVE, BLOOD: HCG Qt: <1 m[IU]/mL

## 2013-12-01 LAB — URINALYSIS
Bilirubin: NEGATIVE
Blood: NEGATIVE
Glucose: NEGATIVE
Ketones: NEGATIVE
Leuk Esterase: NEGATIVE
Nitrite: NEGATIVE
Specific Gravity: 1.014 (ref 1.002–1.030)
Urobilinogen: NEGATIVE
pH: 6 (ref 5.0–8.0)

## 2013-12-01 LAB — COMPREHENSIVE METABOLIC PANEL, BLOOD
ALT (SGPT): 66 U/L — ABNORMAL HIGH (ref 0–33)
AST (SGOT): 39 U/L — ABNORMAL HIGH (ref 0–32)
Albumin: 4.4 g/dL (ref 3.5–5.2)
Alkaline Phos: 92 U/L (ref 35–140)
Anion Gap: 16 mmol/L — ABNORMAL HIGH (ref 7–15)
BUN: 9 mg/dL (ref 6–20)
Bicarbonate: 18 mmol/L — ABNORMAL LOW (ref 22–29)
Bilirubin, Tot: 0.48 mg/dL (ref <1.20)
Calcium: 9.8 mg/dL (ref 8.5–10.6)
Chloride: 100 mmol/L (ref 98–107)
Creatinine: 0.36 mg/dL — ABNORMAL LOW (ref 0.51–0.95)
GFR: >60 mL/min
Glucose: 192 mg/dL — ABNORMAL HIGH (ref 70–115)
Potassium: 4.1 mmol/L (ref 3.5–5.1)
Sodium: 134 mmol/L — ABNORMAL LOW (ref 136–145)
Total Protein: 7.8 g/dL (ref 6.0–8.0)

## 2013-12-01 LAB — C-REACTIVE PROTEIN, BLOOD: CRP: 0.7 mg/dL — ABNORMAL HIGH (ref <0.5)

## 2013-12-01 LAB — GLUCOSE (POCT)
Glucose (POCT): 118 mg/dL — ABNORMAL HIGH (ref 70–115)
Glucose (POCT): 169 mg/dL — ABNORMAL HIGH (ref 70–115)

## 2013-12-01 LAB — LIPASE, BLOOD: Lipase: 43 U/L (ref 13–60)

## 2013-12-01 LAB — SED RATE, BLOOD: Sed Rate: 16 mm/hr (ref 0–20)

## 2013-12-01 MED ORDER — HYDROMORPHONE HCL 1 MG/ML IJ SOLN
0.5000 mg | Freq: Once | INTRAMUSCULAR | Status: AC
Start: 2013-12-01 — End: 2013-12-01
  Administered 2013-12-01: 0.5 mg via INTRAVENOUS
  Filled 2013-12-01: qty 0.5

## 2013-12-01 MED ORDER — ONDANSETRON HCL 4 MG/2ML IV SOLN
4.0000 mg | Freq: Once | INTRAMUSCULAR | Status: AC
Start: 2013-12-01 — End: 2013-12-01
  Administered 2013-12-01: 4 mg via INTRAVENOUS
  Filled 2013-12-01: qty 2

## 2013-12-01 MED ORDER — ONDANSETRON HCL 4 MG/2ML IV SOLN
4.0000 mg | Freq: Once | INTRAMUSCULAR | Status: DC
Start: 2013-12-01 — End: 2013-12-02

## 2013-12-01 MED ORDER — LISINOPRIL 10 MG OR TABS
40.0000 mg | ORAL_TABLET | Freq: Once | ORAL | Status: AC
Start: 2013-12-01 — End: 2013-12-01
  Administered 2013-12-01: 40 mg via ORAL
  Filled 2013-12-01: qty 4

## 2013-12-01 MED ORDER — CARVEDILOL 25 MG OR TABS
25.0000 mg | ORAL_TABLET | Freq: Once | ORAL | Status: AC
Start: 2013-12-01 — End: 2013-12-01
  Administered 2013-12-01: 25 mg via ORAL
  Filled 2013-12-01: qty 1

## 2013-12-01 MED ORDER — SODIUM CHLORIDE 0.9 % IV SOLN
Freq: Once | INTRAVENOUS | Status: AC
Start: 2013-12-01 — End: 2013-12-01
  Administered 2013-12-01: 11:00:00 via INTRAVENOUS

## 2013-12-01 MED ORDER — ONDANSETRON HCL 4 MG/2ML IV SOLN
4.0000 mg | Freq: Once | INTRAMUSCULAR | Status: AC
Start: 2013-12-01 — End: 2013-12-01
  Administered 2013-12-01 (×2): 4 mg via INTRAVENOUS
  Filled 2013-12-01: qty 2

## 2013-12-01 MED ORDER — SODIUM CHLORIDE 0.9 % IV SOLN
INTRAVENOUS | Status: AC
Start: 2013-12-01 — End: 2013-12-01
  Administered 2013-12-01: 14:00:00 via INTRAVENOUS

## 2013-12-01 MED ORDER — KETOROLAC TROMETHAMINE 30 MG/ML IJ SOLN
30.0000 mg | Freq: Once | INTRAMUSCULAR | Status: AC
Start: 2013-12-01 — End: 2013-12-01
  Administered 2013-12-01: 30 mg via INTRAVENOUS
  Filled 2013-12-01: qty 1

## 2013-12-01 MED ORDER — CLONIDINE HCL 0.1 MG OR TABS
0.3000 mg | ORAL_TABLET | Freq: Two times a day (BID) | ORAL | Status: DC
Start: 2013-12-01 — End: 2013-12-02
  Administered 2013-12-01 (×2): 0.3 mg via ORAL
  Filled 2013-12-01: qty 3

## 2013-12-01 MED ORDER — HYDROMORPHONE HCL 1 MG/ML IJ SOLN
1.0000 mg | Freq: Once | INTRAMUSCULAR | Status: AC
Start: 2013-12-01 — End: 2013-12-01
  Administered 2013-12-01: 1 mg via INTRAVENOUS
  Filled 2013-12-01: qty 1

## 2013-12-01 MED ORDER — CLONIDINE HCL 0.1 MG OR TABS
0.3000 mg | ORAL_TABLET | Freq: Once | ORAL | Status: AC
Start: 2013-12-01 — End: 2013-12-01
  Administered 2013-12-01: 0.3 mg via ORAL
  Filled 2013-12-01: qty 3

## 2013-12-01 NOTE — ED Provider Notes (Signed)
History  Chief Complaint   Patient presents with    Flank Pain     pt biba from clinic - had pyelo about 1 month ago. has similar s/s today with R flank pain since monday nausea, vomiting, headache. pain 7/10 R flank.     HPI  48 yo female PMH significant for htn, DM2 (A1C 10), HLD, ulcerative colitis p/w R flank pain and urinary frequency. Patient states that sx's began in early April at which time she was diagnosed with a UTI and started on unknown abx. Sx's did not improve with abx and have been progressively worsening over the last 5 days. Sx's include R flank pain that is constant, dull and achy with intermittent cramping/shooting pain lasting 1-2 seconds on R side/abdomen. Associated sx's include nausea, emesis two times a day since Monday, myalgias, hot flashes. Pt unable to tolerate PO, decreased appetite. Was seen by PCP this morning and told to come to ED. She also endorses h/o chronic diarrhea from Ulcerative colitis, which is not currently taking any medication for. Of note pt also has poorly controlled DM2 with A1C of 10, she states compliance with latus and Humalog. No fevers, no melena, no dysuria, no frequency, no urgency, no hematuria but she does endorse pink urine yesterday, no h/o renal failure or hydro, no constipation.     Past Medical History   Diagnosis Date    Back pain     High blood pressure     Diabetes mellitus     Hyperlipidemia LDL goal < 100     Colitis, ulcerative        Past Surgical History   Procedure Laterality Date    Pb anesth,tubal ligation/transection  1994    Colonoscopy  04/2011     Repeat surveillance colo in 2019     meds  -clonidine  -carvedilol  -lisinopril  -lantus 40 mg qhs  -humalog 12 mg bid and SSI  -Norco    Allergies;  -ciprofoxacin: rash  -flagyl: rash  -tetanus vaccine    Family History   Problem Relation Age of Onset    Diabetes Father     Lipids Father     Hypertension Father     Hypertension Mother        History   Substance Use Topics    Smoking  status: Never Smoker     Smokeless tobacco: Never Used    Alcohol Use: No       Review of Systems  12 point review of systems is negative except for as mentioned above in HPI.   Physical Exam  BP 177/102   Pulse 77   Temp(Src) 98.6 F (37 C)   Resp 22   Ht $R'5\' 5"'yV$  (1.651 m)   Wt 72.576 kg (160 lb)   BMI 26.63 kg/m2   SpO2 98%   LMP 06/02/2011  4    12/01/13  1732 12/01/13  1904 12/01/13  1918 12/01/13  2133   BP: 203/112 147/106 158/108 147/96   Pulse: 73 74 78 71   Temp:       Resp: $Remo'18 20 18 17   'jbtbL$ SpO2: 96% 98% 99% 98%       Physical Exam   Constitutional: She is oriented to person, place, and time. She appears well-developed and well-nourished. No distress.   HENT:   Head: Normocephalic and atraumatic.   Nose: Nose normal.   Mouth/Throat: Oropharynx is clear and moist. No oropharyngeal exudate.   Eyes: EOM are normal. Pupils  are equal, round, and reactive to light. No scleral icterus.   Neck: Normal range of motion. Neck supple. No JVD present.   Cardiovascular: Normal rate, regular rhythm, normal heart sounds and intact distal pulses.  Exam reveals no gallop and no friction rub.    No murmur heard.  Pulmonary/Chest: Effort normal and breath sounds normal. No stridor. No respiratory distress. She has no wheezes. She has no rales. She exhibits no tenderness.   Abdominal: Soft. Bowel sounds are normal. She exhibits no distension and no mass. There is tenderness. There is guarding. There is no rebound. No hernia.   + ttp R flank and R abdomen. + rovsing. + abd pain with heel tap   Musculoskeletal: Normal range of motion. She exhibits no edema or tenderness.   Neurological: She is alert and oriented to person, place, and time. No cranial nerve deficit. Coordination normal.   Skin: Skin is warm and dry. No rash noted. She is not diaphoretic. No erythema. No pallor.   Psychiatric: She has a normal mood and affect. Her behavior is normal. Judgment and thought content normal.   Nursing note and vitals reviewed.      ED  Course/Medical Decision Making Narrative  48 yo female PMH significant for htn, DM2 (A1C 10), HLD, ulcerative colitis p/w R flank pain and urinary frequency. TTP R flank and R abdomen, patient appears in mild distress 2/2 pain, afebrile, hypertensive in ED responding to home medication, nausea but no emesis in ED. W/u for similar sx's 1 year ago negative for neprholithiasis. Differential diagnoses of flank pain includes nephrolithiasis vs UTI vs hydronephrosis, vs appendicitis, less concern for pyelonephritis as pt is afebrile but does endorse hot flashes. Other possible etiologies are ulcerative colitis flair given h/o chronic diarrhea.  Will obtain UA, renal US and KUB if no e/o nephrolithiasis will obtain CT abdomen for further eval of appendicitis and possible Heath. Prior ct shows retrocecal appendix which may cause back pain if inflamed. HTN likely 2/2 pain and inability to keep medication down in addition to h/o poor BP control as out-pt.   -cbc  -cmp  -lipase  -esr/crp  -ua  -POC glc  -analgesia with ketorolac  -IVF  -imaging: Renal US, KUB      5:22 PM  Renal US and abd xray negative for stones. UA no e/o UTI or hematuria. Labs otherwise unremarkable. Pt continues to have severe R flank pain and nausea despite analgesia. Will obtain CT abdomen to r/o appendicitis and eval for possible [place] flair or diverticulitis.     5:56 PM  Pt feeling mild improvement in pain after dilaudid but pt continues to be nauseas, uncomfortable. Will give pm home antihypertensive medications. Pending CT abdomen. Workup thus far negative for UTI and nephrolithiasis. Differential more likely ulcerative colitis vs. Appendicitis. Patient will likely need admission for abdominal pain and management of N/V. Patient signed out to oncoming team.    Critical Care Time            Additional Notes      Home Medication List  Prior to Admission Medications   Prescriptions Last Dose Informant Patient Reported? Taking?   Gauze Pads & Dressings  (CURITY IODOFORM PACKING STRIP) MISC   No No   Sig: Apply 1 bottle topically daily.   Gauze Pads & Dressings (GAUZE DRESSING) 4"X4" PADS   No No   Sig: Apply 1 Application topically daily.   HYDROcodone-acetaminophen (NORCO) 5-325 MG tablet   No No   Sig:  Take 1 tablet by mouth every 6 hours as needed for Moderate Pain (Pain Score 4-6).   HYDROmorphone (DILAUDID) 2 MG tablet   No No   Sig: Take 1.5 tablets by mouth every 4 hours as needed for Moderate Pain (Pain Score 4-6) or Severe Pain (Pain Score 7-10).   Insulin Syringe-Needle U-100 31G X 5/16" 0.3 ML MISC   No No   Sig: Use one syringe with each insulin administration.   amoxicillin-clavulanate (AUGMENTIN) 875-125 MG per tablet   No No   Sig: Take 1 tablet by mouth every 12 hours.   atorvastatin (LIPITOR) 20 MG tablet   No No   Sig: Take 1 tablet by mouth daily.   blood glucose meter   No No   Sig: Use as directed.   diazepam (VALIUM) 5 MG tablet   Yes No   Sig: Take 5 mg by mouth every 12 hours as needed.   dicyclomine (BENTYL) 10 MG capsule   No No   Sig: Take 1 capsule by mouth 3 times daily (before meals).   docusate sodium (COLACE) 250 MG capsule   No No   Sig: Take 1 capsule by mouth 2 times daily.   docusate sodium (COLACE) 250 MG capsule   No No   Sig: Take 1 capsule by mouth 2 times daily.   glucose blood test strip   No No   Sig: 1 strip by Other route 4 times daily (before meals and nightly).   insulin glargine (LANTUS) 100 UNIT/ML injection   No No   Sig: Inject 36 Units under the skin nightly.   insulin lispro (HUMALOG) 100 UNIT/ML injection   No No   Sig: Inject 12 Units under the skin 3 times daily (before meals).   lancets   No No   Sig: 1 Lancet by Other route 4 times daily (before meals and nightly).   lansoprazole (PREVACID SOLUTAB) 30 MG disintegrating tablet   No No   Sig: Take 1 tablet by mouth daily.   lisinopril (PRINIVIL, ZESTRIL) 40 MG tablet   No No   Sig: Take 1 tablet by mouth 2 times daily.   lisinopril (PRINIVIL, ZESTRIL) 40 MG  tablet   No No   Sig: Take 1 tablet by mouth 2 times daily.   omeprazole (PRILOSEC) 20 MG capsule   Yes No   Sig: Take 20 mg by mouth daily.   ondansetron (ZOFRAN ODT) 4 MG disintegrating tablet   No No   Sig: Take 1 tablet by mouth every 6 hours as needed for Nausea.   ondansetron (ZOFRAN ODT) 4 MG disintegrating tablet   No No   Sig: Take 1 tablet by mouth every 8 hours as needed for Nausea.   oxyCODONE-acetaminophen (PERCOCET) 5-325 MG per tablet   No No   Sig: Take 1 tablet by mouth every 6 hours as needed for Severe Pain (Pain Score 7-10).   polyethylene glycol (MIRALAX) packet   No No   Sig: Take 1 packet by mouth daily.   senna (SENOKOT) 8.6 MG tablet   No No   Sig: Take 1 tablet by mouth daily.   senna (SENOKOT) 8.6 MG tablet   No No   Sig: Take 1 tablet by mouth daily.      Facility-Administered Medications: None       Merlene Laughter, MD  Resident  12/01/13 [phone]

## 2013-12-01 NOTE — ED Notes (Addendum)
Resume care of pt. Resting in gurney c/o rt. Flank pain 10/10 sharp history of uti. No acute distress noted.

## 2013-12-01 NOTE — ED Notes (Signed)
To ct via gurney per radiology transporter and volunteer, side rails up x 2

## 2013-12-01 NOTE — ED Notes (Signed)
Back from CT

## 2013-12-01 NOTE — ED Notes (Signed)
Urine sample obtained and sent to the lab.

## 2013-12-01 NOTE — ED Notes (Signed)
PRN note  2310: r abd pain 8/10  Dilaudid 1mg  iv in 10ml NS given sivp  FSG=FSG=[id number]

## 2013-12-01 NOTE — ED Notes (Signed)
Medicated for pain/nausea IV fluid infusing w/o pt. Tolerating well

## 2013-12-01 NOTE — Discharge Instructions (Signed)
Abdominal Pain     You have been diagnosed with abdominal (belly) pain. The cause of your pain is not yet known.     Many things can cause abdominal pain. Examples include viral infections and bowel (intestine) spasms. You might need another examination or more tests to find out why you have pain.     At this time, your pain does not seem to be caused by anything dangerous. You do not need surgery. You do not need to stay in the hospital.      Though we don’t believe your condition is dangerous right now, it is important to be careful. Sometimes a problem that seems mild can become serious later. This is why it is very important that you return here or go to the nearest Emergency Department unless you are 100% improved.     Return here or go to the nearest Emergency Department, or follow up with your physician in:  · 24 hours.     Drink only clear liquids such as water, clear broth, sports drinks, or clear caffeine-free soft drinks, like 7-Up or Sprite, for the next:  · 24 hours.     YOU SHOULD SEEK MEDICAL ATTENTION IMMEDIATELY, EITHER HERE OR AT THE NEAREST EMERGENCY DEPARTMENT, IF ANY OF THE FOLLOWING OCCURS:  · Your pain does not go away or gets worse.  · You cannot keep fluids down or your vomit is dark green.   · You vomit blood or see blood in your stool. Blood might be bright red or dark red. It can also be black and look like tar.  · You have a fever (temperature higher than 100.4ºF / 38ºC) or shaking chills.  · Your skin or eyes look yellow or your urine looks brown.  · You have severe diarrhea.

## 2013-12-01 NOTE — ED Notes (Signed)
PRN note  2315: pt states she feels better that when she first arrived, but not well enough to go home  C/o HA  BP: 155/105  Dr watking informed of pt reporting not feeling well enough to go home and c/o HA and BP of 155/105  Primary rn, G Salcido aware

## 2013-12-01 NOTE — ED Notes (Signed)
Verbal report received from Wyn Forster. Price, RN and patient care assumed, patient contact made was found awake calm and cooperative, endorses 7/10 RT flank pain and nausea (MD is aware) at this time and is hypertensive 158/108, aao x 4, rr even and unlabored, skins wpd, mae x 4, gurney locked in low position with brakes in locked position and side rails x 2 in upward position

## 2013-12-01 NOTE — ED Notes (Signed)
1353-    PT taken to U/S via gurney x 2 rails up and locked; RN notified;

## 2013-12-01 NOTE — ED Notes (Signed)
Bed: 11B  Expected date:   Expected time:   Means of arrival:   Comments:  [id number] R flank pain

## 2013-12-01 NOTE — ED Notes (Signed)
23F hypertension, diabetes mellitus, right flank pain, recent uti. Urinalysis wnl. Renal us wnl. [place] history. appy vs [place] flare. CT abdomen/pelvis pending. Discharge if feels better and CT negative and tolerating po. Admit if pain persists and cannot tolerate po.     Labs Reviewed   COMPREHENSIVE METABOLIC PANEL, BLOOD - Abnormal; Notable for the following:     Glucose 192 (*)     Creatinine 0.36 (*)     Sodium 134 (*)     Bicarbonate 18 (*)     Anion Gap 16 (*)     AST (SGOT) 39 (*)     ALT (SGPT) 66 (*)     All other components within normal limits   URINALYSIS - Abnormal; Notable for the following:     Protein 2+ (*)     All other components within normal limits   CBC WITH ADIFF, BLOOD - Abnormal; Notable for the following:     Monocytes 4 (*)     Abs Lymphs 3.4 (*)     All other components within normal limits   C-REACTIVE PROTEIN, BLOOD - Abnormal; Notable for the following:     CRP 0.7 (*)     All other components within normal limits   GLUCOSE (POCT) - Abnormal; Notable for the following:     Glucose (POCT) 118 (*)     All other components within normal limits   LIPASE, BLOOD   SED RATE, BLOOD   SED RATE, BLOOD   C-REACTIVE PROTEIN, BLOOD   URINE CULTURE    Narrative:     Container Type->Sterile Container     X-ray Abdomen Ap/oblique/cone Views    12/01/2013    EXAM DESCRIPTION:  ABDOMEN AP/OBLIQUE/CONE VIEWS  CLINICAL HISTORY: Right flank pain.  COMPARISON: None available.  FINDINGS: See impression  IMPRESSION: The lung bases are clear. Nonobstructive bowel gas pattern. No evidence of free intraperitoneal air. No evidence of renal calculi. Unremarkable osseous structures.    [place] Kidney Complete    12/01/2013    EXAM DESCRIPTION: US RETROPERITONEAL COMPLETE  CLINICAL HISTORY: Right flank pain radiating to the groin. Recent history of pyelonephritis.  TECHNIQUE: Per Protocol.  COMPARISON: CT abdomen pelvis 06/29/2013.  FINDINGS: The right kidney measures 11.2 cm and the left kidney measures 10.8 cm in long  axis.  Both are within normal limits with no hydronephrosis or renal calculi.  Flow is present in the main renal artery and vein bilaterally.  The bladder is grossly normal. Bilateral ureteral jets were visualized.  IMPRESSION: Unremarkable renal ultrasound.  CONCURRENT SUPERVISION: I have reviewed the images and agree with the resident's interpretation.      Katherine Church, Katherine Theiler Marie, MD  Resident  12/01/13 [phone]

## 2013-12-01 NOTE — ED Notes (Signed)
48 year old woman with flank pain radiating to the groin, prior urinary tract infections. Initial urinalysis unremarkable. Prior history of ulcerative colitis. CT pending to evaluate for stones?Ultrasound also pending to eval for Katherine [employer]    Terrance Usery, Consuello MasseJames Vincent, MD  12/01/13 1447

## 2013-12-01 NOTE — ED Notes (Signed)
Sign-out note:  Assuming care of this 48 year old female with abdominal pain with unremarkable workup, being discharged.     Dorann LodgeVan Hoesen, Scheryl MartenKaren Beth, MD  12/01/13 2250

## 2013-12-01 NOTE — ED Attending Note (Signed)
ED ATTENDING NOTE:    .Pt seen and examined    d/w Dr. Maryan Charerksen    Key elements of hx and pe include female w/ intermittent and worsening right flank pain, today became colicky.  Decrease appetite and p.o. Intake.  Initially thought it was similar to previous pyelo symptoms but the nature of the pain is actually somewhat different.  VS noted, alert, cv rrr, lungs clear, has right cvat w/ some, but less RUQ and RLQ ttp w/o guarding or rebound.    Remainder of hx and pe as per resident, agree w/ residents assessment and plan.   Diff dx includes recurrent or refractory pyelo +/- abscess, stone, infected stone, less likely retrocecal appy.

## 2013-12-01 NOTE — ED Notes (Signed)
Pt. To US via gurney

## 2013-12-02 NOTE — ED Notes (Addendum)
PRN note for d/c  0040: pt has been informed by dr watkins regarding d/c  Dr Abran Dukelanzillo OK for pt to be d/ced w/ BP 170/102  A.ox4 in nad and wo complaintss.  D/c instructions reviewed and signed by pt and rn. Pt verbalized understanding of d/c instructions. piv x 2 d/ced w/o complications.  Ambulated steadily out of er w/ avs. Pt to take a taxi home

## 2013-12-03 LAB — URINE CULTURE: Urine Culture Result: NO GROWTH

## 2013-12-13 NOTE — ED Follow-up Note (Signed)
Follow-up type: Callback       Routine ED Patient Call Back    Patient contacted by telephone,'' I just got back from my primary. Im feeling much better. Thank you for calling.''

## 2015-01-03 ENCOUNTER — Inpatient Hospital Stay
Admission: EM | Admit: 2015-01-03 | Discharge: 2015-01-05 | DRG: 386 | Disposition: A | Discharge status: Home / Self Care | Payer: Medicaid Other | Attending: Hospitalist | Admitting: Hospitalist

## 2015-01-03 DIAGNOSIS — Z794 Long term (current) use of insulin: Secondary | ICD-10-CM

## 2015-01-03 DIAGNOSIS — E873 Alkalosis: Secondary | ICD-10-CM | POA: Diagnosis present

## 2015-01-03 DIAGNOSIS — R55 Syncope and collapse: Secondary | ICD-10-CM

## 2015-01-03 DIAGNOSIS — R51 Headache: Secondary | ICD-10-CM

## 2015-01-03 DIAGNOSIS — E860 Dehydration: Secondary | ICD-10-CM

## 2015-01-03 DIAGNOSIS — R160 Hepatomegaly, not elsewhere classified: Secondary | ICD-10-CM

## 2015-01-03 DIAGNOSIS — E1165 Type 2 diabetes mellitus with hyperglycemia: Secondary | ICD-10-CM | POA: Diagnosis present

## 2015-01-03 DIAGNOSIS — G8929 Other chronic pain: Secondary | ICD-10-CM | POA: Diagnosis present

## 2015-01-03 DIAGNOSIS — K5100 Ulcerative (chronic) pancolitis without complications: Principal | ICD-10-CM | POA: Diagnosis present

## 2015-01-03 DIAGNOSIS — E785 Hyperlipidemia, unspecified: Secondary | ICD-10-CM | POA: Diagnosis present

## 2015-01-03 DIAGNOSIS — E119 Type 2 diabetes mellitus without complications: Secondary | ICD-10-CM

## 2015-01-03 DIAGNOSIS — R112 Nausea with vomiting, unspecified: Secondary | ICD-10-CM

## 2015-01-03 DIAGNOSIS — K529 Noninfective gastroenteritis and colitis, unspecified: Secondary | ICD-10-CM

## 2015-01-03 DIAGNOSIS — E872 Acidosis, unspecified: Secondary | ICD-10-CM

## 2015-01-03 DIAGNOSIS — M549 Dorsalgia, unspecified: Secondary | ICD-10-CM | POA: Diagnosis present

## 2015-01-03 DIAGNOSIS — I10 Essential (primary) hypertension: Secondary | ICD-10-CM | POA: Diagnosis present

## 2015-01-03 DIAGNOSIS — E876 Hypokalemia: Secondary | ICD-10-CM | POA: Diagnosis present

## 2015-01-03 LAB — URINALYSIS WITH CULTURE REFLEX, WHEN INDICATED
Bilirubin: NEGATIVE
Blood: NEGATIVE
Leuk Esterase: NEGATIVE
Nitrite: NEGATIVE
Specific Gravity: 1.041 — ABNORMAL HIGH (ref 1.002–1.030)
Urobilinogen: NEGATIVE
pH: 6 (ref 5.0–8.0)

## 2015-01-03 LAB — COMPREHENSIVE METABOLIC PANEL, BLOOD
ALT (SGPT): 37 U/L — ABNORMAL HIGH (ref 0–33)
ANION GAP: 23 mmol/L — ABNORMAL HIGH (ref 7–15)
AST (SGOT): 19 U/L (ref 0–32)
Albumin: 4.6 g/dL (ref 3.5–5.2)
Alkaline Phos: 113 U/L (ref 35–140)
BUN: 13 mg/dL (ref 6–20)
Bicarbonate: 20 mmol/L — ABNORMAL LOW (ref 22–29)
Bilirubin, Tot: 0.62 mg/dL (ref <1.20)
CHLORIDE: 95 mmol/L — ABNORMAL LOW (ref 98–107)
Calcium: 10 mg/dL (ref 8.5–10.6)
Creatinine: 0.51 mg/dL (ref 0.51–0.95)
GFR: >60 mL/min
Glucose: 315 mg/dL — ABNORMAL HIGH (ref 70–115)
Potassium: 4 mmol/L (ref 3.5–5.1)
Sodium: 138 mmol/L (ref 136–145)
Total Protein: 8.7 g/dL — ABNORMAL HIGH (ref 6.0–8.0)

## 2015-01-03 LAB — BASIC METABOLIC PANEL, BLOOD
Anion Gap: 19 mmol/L — ABNORMAL HIGH (ref 7–15)
BUN: 11 mg/dL (ref 6–20)
Bicarbonate: 21 mmol/L — ABNORMAL LOW (ref 22–29)
CHLORIDE: 97 mmol/L — ABNORMAL LOW (ref 98–107)
Calcium: 9.3 mg/dL (ref 8.5–10.6)
Creatinine: 0.47 mg/dL — ABNORMAL LOW (ref 0.51–0.95)
GFR: >60 mL/min
Glucose: 297 mg/dL — ABNORMAL HIGH (ref 70–115)
Potassium: 3.8 mmol/L (ref 3.5–5.1)
Sodium: 137 mmol/L (ref 136–145)

## 2015-01-03 LAB — VENOUS BLOOD GAS
BE, Ven: 0.3 mmol/L (ref <=2.3)
FIO2, VEN: 21 %
HCO3, Ven: 22 mmol/L — ABNORMAL LOW (ref 23–28)
O2 Sat, Ven (Est): 89.3 %
PCO2, VEN (UNCORR): 28 mmHg — ABNORMAL LOW (ref 40–52)
PO2, VEN (T): 50 mmHg — ABNORMAL HIGH (ref 25–44)
PO2, VEN (UNCORR): 50 mmHg — ABNORMAL HIGH (ref 25–44)
Temp, Ven: 37 'C
pCO2, Ven (T): 28 mmHg — ABNORMAL LOW (ref 40–52)
pH, Ven (T): 7.51 — ABNORMAL HIGH (ref 7.33–7.40)
pH, Ven (Uncorr): 7.51 — ABNORMAL HIGH (ref 7.33–7.40)

## 2015-01-03 LAB — CBC WITH DIFF, BLOOD
ANC-Automated: 11.1 10*3/uL — ABNORMAL HIGH (ref 1.6–7.0)
Abs Eosinophils: 0.2 10*3/uL (ref 0.1–0.7)
Abs Lymphs: 2.8 10*3/uL (ref 0.8–3.1)
Abs Monos: 0.6 10*3/uL (ref 0.2–0.8)
Eosinophils: 1 % (ref 1–4)
HGB: 15.1 gm/dL (ref 11.2–15.7)
Hct: 42.6 % (ref 34.0–45.0)
LYMPHOCYTES: 19 % (ref 19–53)
MCH: 28.6 pg (ref 26.0–32.0)
MCHC: 35.4 % (ref 32.0–36.0)
MCV: 80.7 um3 (ref 79.0–95.0)
MPV: 9.6 fL (ref 9.4–12.4)
Monocytes: 4 % — ABNORMAL LOW (ref 5–12)
Plt Count: 255 10*3/uL (ref 140–370)
RBC: 5.28 10*6/uL — ABNORMAL HIGH (ref 3.90–5.20)
RDW: 13.2 % (ref 12.0–14.0)
Segs: 76 % — ABNORMAL HIGH (ref 34–71)
WBC: 14.7 10*3/uL — ABNORMAL HIGH (ref 4.0–10.0)

## 2015-01-03 LAB — C-REACTIVE PROTEIN, BLOOD: CRP: 3.1 mg/dL — ABNORMAL HIGH (ref <0.5)

## 2015-01-03 LAB — CPK-CREATINE PHOSPHOKINASE, BLOOD: CPK: 44 U/L (ref 0–175)

## 2015-01-03 LAB — GLUCOSE (POCT)
Glucose (POCT): 129 mg/dL — ABNORMAL HIGH (ref 70–115)
Glucose (POCT): 112 mg/dL (ref 70–115)
Glucose (POCT): 128 mg/dL — ABNORMAL HIGH (ref 70–115)

## 2015-01-03 LAB — LIPASE, BLOOD: Lipase: 48 U/L (ref 13–60)

## 2015-01-03 LAB — SED RATE, BLOOD
Sed Rate: 23 mm/hr (ref 0–30)
Sed Rate: 20 mm/hr (ref 0–30)

## 2015-01-03 LAB — LACTATE, BLOOD: Lactate: 2.5 mmol/L — ABNORMAL HIGH (ref 0.5–2.2)

## 2015-01-03 LAB — GLYCOSYLATED HGB(A1C), BLOOD: Glyco Hgb (A1C): 9.1 % — ABNORMAL HIGH (ref 4.8–5.9)

## 2015-01-03 LAB — C4, BLOOD: C4: 30 mg/dL (ref 10–40)

## 2015-01-03 LAB — TROPONIN T, BLOOD: Troponin T: <0.01 ng/mL (ref <0.01)

## 2015-01-03 LAB — CKMB+INDEX (NO CPK), BLOOD: CK-MB: <1 ng/mL (ref 0.0–2.8)

## 2015-01-03 LAB — BETA-HYDROXYBUTYRATE: BETA-HYDROXYBUTYRATE: <2.1 mg/dL (ref <2.8)

## 2015-01-03 LAB — C3, BLOOD: C3: 157 mg/dL (ref 90–180)

## 2015-01-03 MED ORDER — HYDROMORPHONE HCL 1 MG/ML IJ SOLN
1.0000 mg | INTRAMUSCULAR | Status: DC | PRN
Start: 2015-01-03 — End: 2015-01-05
  Administered 2015-01-03 – 2015-01-05 (×8): 1 mg via INTRAVENOUS
  Filled 2015-01-03 (×8): qty 1

## 2015-01-03 MED ORDER — SODIUM CHLORIDE 0.9 % IJ SOLN (CUSTOM)
3.0000 mL | Freq: Three times a day (TID) | INTRAMUSCULAR | Status: DC
Start: 2015-01-03 — End: 2015-01-05
  Administered 2015-01-04 – 2015-01-05 (×4): 3 mL via INTRAVENOUS

## 2015-01-03 MED ORDER — SODIUM CHLORIDE 0.9% TKO INFUSION
INTRAVENOUS | Status: DC | PRN
Start: 2015-01-03 — End: 2015-01-05

## 2015-01-03 MED ORDER — HYDROCHLOROTHIAZIDE PO
ORAL | Status: DC
Start: ? — End: 2015-01-05

## 2015-01-03 MED ORDER — LACTATED RINGERS IV SOLN
Freq: Once | INTRAVENOUS | Status: AC
Start: 2015-01-03 — End: 2015-01-03
  Administered 2015-01-03: 08:00:00 via INTRAVENOUS

## 2015-01-03 MED ORDER — SODIUM CHLORIDE 0.9 % IV SOLN
Freq: Once | INTRAVENOUS | Status: AC
Start: 2015-01-03 — End: 2015-01-03
  Administered 2015-01-03: 11:00:00 via INTRAVENOUS

## 2015-01-03 MED ORDER — ONDANSETRON HCL 4 MG/2ML IV SOLN
4.0000 mg | Freq: Four times a day (QID) | INTRAMUSCULAR | Status: AC | PRN
Start: 2015-01-03 — End: 2015-01-03
  Filled 2015-01-03: qty 2

## 2015-01-03 MED ORDER — ONDANSETRON HCL 4 MG/2ML IV SOLN
8.0000 mg | Freq: Once | INTRAMUSCULAR | Status: AC
Start: 2015-01-03 — End: 2015-01-03
  Administered 2015-01-03: 8 mg via INTRAVENOUS
  Filled 2015-01-03: qty 4

## 2015-01-03 MED ORDER — INSULIN GLARGINE 100 UNIT/ML SC SOLN
30.0000 [IU] | Freq: Every morning | SUBCUTANEOUS | Status: DC
Start: 2015-01-03 — End: 2015-01-04
  Administered 2015-01-03: 30 [IU] via SUBCUTANEOUS
  Filled 2015-01-03: qty 30

## 2015-01-03 MED ORDER — SODIUM CHLORIDE 0.9 % IV SOLN
Freq: Once | INTRAVENOUS | Status: AC
Start: 2015-01-03 — End: 2015-01-03
  Administered 2015-01-03: 15:00:00 via INTRAVENOUS

## 2015-01-03 MED ORDER — INSULIN LISPRO (HUMAN) 100 UNIT/ML SC SOLN (CUSTOM)
1.0000 [IU] | Freq: Four times a day (QID) | INTRAMUSCULAR | Status: DC
Start: 2015-01-03 — End: 2015-01-04

## 2015-01-03 MED ORDER — HYDROMORPHONE HCL 1 MG/ML IJ SOLN
0.5000 mg | INTRAMUSCULAR | Status: DC | PRN
Start: 2015-01-03 — End: 2015-01-05
  Filled 2015-01-03 (×3): qty 1

## 2015-01-03 MED ORDER — SENNA 8.6 MG OR TABS
2.0000 | ORAL_TABLET | Freq: Every evening | ORAL | Status: DC
Start: 2015-01-03 — End: 2015-01-05
  Administered 2015-01-03: 17.2 mg via ORAL
  Filled 2015-01-03 (×2): qty 2

## 2015-01-03 MED ORDER — ACETAMINOPHEN 650 MG RE SUPP
650.0000 mg | RECTAL | Status: DC | PRN
Start: 2015-01-03 — End: 2015-01-05

## 2015-01-03 MED ORDER — DEXTROSE (DIABETIC USE) 40 % OR GEL
1.0000 | ORAL | Status: DC | PRN
Start: 2015-01-03 — End: 2015-01-05

## 2015-01-03 MED ORDER — POLYETHYLENE GLYCOL 3350 OR PACK
17.0000 g | PACK | Freq: Every day | ORAL | Status: DC
Start: 2015-01-03 — End: 2015-01-05
  Filled 2015-01-03: qty 1

## 2015-01-03 MED ORDER — HYDROMORPHONE HCL 1 MG/ML IJ SOLN
1.0000 mg | Freq: Once | INTRAMUSCULAR | Status: AC
Start: 2015-01-03 — End: 2015-01-03
  Administered 2015-01-03: 1 mg via INTRAVENOUS
  Filled 2015-01-03: qty 1

## 2015-01-03 MED ORDER — RAMELTEON 8 MG OR TABS
8.0000 mg | ORAL_TABLET | Freq: Every evening | ORAL | Status: DC
Start: 2015-01-03 — End: 2015-01-05
  Administered 2015-01-03 – 2015-01-04 (×2): 8 mg via ORAL
  Filled 2015-01-03 (×2): qty 1

## 2015-01-03 MED ORDER — NALOXONE HCL 0.4 MG/ML IJ SOLN
0.1000 mg | INTRAMUSCULAR | Status: DC | PRN
Start: 2015-01-03 — End: 2015-01-05

## 2015-01-03 MED ORDER — ONDANSETRON HCL 4 MG/2ML IV SOLN
4.0000 mg | Freq: Four times a day (QID) | INTRAMUSCULAR | Status: DC | PRN
Start: 2015-01-03 — End: 2015-01-05
  Administered 2015-01-03 – 2015-01-05 (×3): 4 mg via INTRAVENOUS
  Filled 2015-01-03 (×3): qty 2

## 2015-01-03 MED ORDER — GLUCAGON HCL (RDNA) 1 MG IJ SOLR
1.0000 mg | Freq: Once | INTRAMUSCULAR | Status: DC | PRN
Start: 2015-01-03 — End: 2015-01-05

## 2015-01-03 MED ORDER — SODIUM CHLORIDE 0.9 % IV SOLN
12.5000 mg | Freq: Once | INTRAVENOUS | Status: AC
Start: 2015-01-03 — End: 2015-01-03
  Administered 2015-01-03: 12.5 mg via INTRAVENOUS
  Filled 2015-01-03: qty 0.5

## 2015-01-03 MED ORDER — PEG 3350-KCL-NABCB-NACL-NASULF 236 GM OR SOLR
4000.0000 mL | Freq: Once | ORAL | Status: AC
Start: 2015-01-03 — End: 2015-01-03
  Administered 2015-01-03: 4000 mL via ORAL
  Filled 2015-01-03: qty 4000

## 2015-01-03 MED ORDER — SODIUM CHLORIDE 0.9 % IJ SOLN (CUSTOM)
3.0000 mL | INTRAMUSCULAR | Status: DC | PRN
Start: 2015-01-03 — End: 2015-01-05

## 2015-01-03 MED ORDER — LISINOPRIL 40 MG OR TABS
40.0000 mg | ORAL_TABLET | Freq: Two times a day (BID) | ORAL | Status: DC
Start: 2015-01-03 — End: 2015-01-05
  Administered 2015-01-03 – 2015-01-05 (×5): 40 mg via ORAL
  Filled 2015-01-03 (×5): qty 1

## 2015-01-03 MED ORDER — ACETAMINOPHEN 160 MG/5ML OR SOLN
650.0000 mg | ORAL | Status: DC | PRN
Start: 2015-01-03 — End: 2015-01-05

## 2015-01-03 MED ORDER — ONDANSETRON HCL 4 MG/2ML IV SOLN
4.0000 mg | Freq: Once | INTRAMUSCULAR | Status: AC
Start: 2015-01-03 — End: 2015-01-03
  Administered 2015-01-03: 4 mg via INTRAVENOUS
  Filled 2015-01-03: qty 2

## 2015-01-03 MED ORDER — SODIUM CHLORIDE 0.9 % IV SOLN
3000.0000 mg | Freq: Four times a day (QID) | INTRAVENOUS | Status: DC
Start: 2015-01-03 — End: 2015-01-05
  Administered 2015-01-03 – 2015-01-05 (×8): 3000 mg via INTRAVENOUS
  Filled 2015-01-03 (×9): qty 3000

## 2015-01-03 MED ORDER — PEG 3350-KCL-NABCB-NACL-NASULF 236 GM OR SOLR
2000.0000 mL | Freq: Once | ORAL | Status: AC
Start: 2015-01-04 — End: 2015-01-04
  Administered 2015-01-04: 2000 mL via ORAL
  Filled 2015-01-03: qty 4000

## 2015-01-03 MED ORDER — HYDROMORPHONE HCL 1 MG/ML IJ SOLN
0.5000 mg | Freq: Once | INTRAMUSCULAR | Status: AC | PRN
Start: 2015-01-03 — End: 2015-01-03
  Administered 2015-01-03: 0.5 mg via INTRAVENOUS

## 2015-01-03 MED ORDER — HYDROCHLOROTHIAZIDE 25 MG OR TABS
25.0000 mg | ORAL_TABLET | Freq: Every day | ORAL | Status: DC
Start: 2015-01-04 — End: 2015-01-05
  Administered 2015-01-04 – 2015-01-05 (×2): 25 mg via ORAL
  Filled 2015-01-03 (×2): qty 1

## 2015-01-03 MED ORDER — CARVEDILOL PO
ORAL | Status: DC
Start: ? — End: 2015-01-05

## 2015-01-03 MED ORDER — DICYCLOMINE HCL 10 MG OR CAPS
10.0000 mg | ORAL_CAPSULE | Freq: Three times a day (TID) | ORAL | Status: DC
Start: 2015-01-03 — End: 2015-01-05
  Administered 2015-01-03 – 2015-01-05 (×6): 10 mg via ORAL
  Filled 2015-01-03 (×6): qty 1

## 2015-01-03 MED ORDER — GLUCOSE 4 GM PO CHEW (CUSTOM)
4.0000 | CHEWABLE_TABLET | ORAL | Status: DC | PRN
Start: 2015-01-03 — End: 2015-01-05

## 2015-01-03 MED ORDER — ACETAMINOPHEN 325 MG PO TABS
650.0000 mg | ORAL_TABLET | ORAL | Status: DC | PRN
Start: 2015-01-03 — End: 2015-01-05

## 2015-01-03 MED ORDER — LANSOPRAZOLE 30 MG OR TBDP
30.0000 mg | ORAL_TABLET | Freq: Every day | ORAL | Status: DC
Start: 2015-01-04 — End: 2015-01-05
  Administered 2015-01-04 – 2015-01-05 (×2): 30 mg via ORAL
  Filled 2015-01-03 (×3): qty 1

## 2015-01-03 MED ORDER — LACTATED RINGERS IV SOLN
Freq: Once | INTRAVENOUS | Status: AC
Start: 2015-01-03 — End: 2015-01-03
  Administered 2015-01-03: 04:00:00 via INTRAVENOUS

## 2015-01-03 MED ORDER — HYDROMORPHONE HCL 1 MG/ML IJ SOLN
1.0000 mg | INTRAMUSCULAR | Status: AC | PRN
Start: 2015-01-03 — End: 2015-01-03
  Administered 2015-01-03: 1 mg via INTRAVENOUS
  Filled 2015-01-03 (×2): qty 1

## 2015-01-03 MED ORDER — DEXTROSE 50 % IV SOLN
12.5000 g | INTRAVENOUS | Status: DC | PRN
Start: 2015-01-03 — End: 2015-01-05

## 2015-01-03 MED ORDER — HYDROMORPHONE HCL 1 MG/ML IJ SOLN
0.5000 mg | INTRAMUSCULAR | Status: DC | PRN
Start: 2015-01-03 — End: 2015-01-05
  Administered 2015-01-03 – 2015-01-05 (×4): 0.5 mg via INTRAVENOUS
  Filled 2015-01-03 (×2): qty 1

## 2015-01-03 MED ORDER — SODIUM CHLORIDE 0.9 % IV SOLN
3000.0000 mg | Freq: Once | INTRAVENOUS | Status: AC
Start: 2015-01-03 — End: 2015-01-03
  Administered 2015-01-03: 3000 mg via INTRAVENOUS
  Filled 2015-01-03: qty 3000

## 2015-01-03 MED ORDER — LACTATED RINGERS IV SOLN
Freq: Once | INTRAVENOUS | Status: AC
Start: 2015-01-03 — End: 2015-01-03
  Administered 2015-01-03: 06:00:00 via INTRAVENOUS

## 2015-01-03 NOTE — Plan of Care (Signed)
Handoff report given to Theresa RN

## 2015-01-03 NOTE — ED Notes (Signed)
Pt to CT via gurney

## 2015-01-03 NOTE — ED MD Progress Note (Signed)
Sign-out note:    Assuming care of this 49 year old female with h/o [place] and prior divertics. Has abd pain and n/v. Better p IVF and zofran. Labs a gap 22, wbc elevated. Lactate pending.   CT scan pending. Reassess and likely admit.

## 2015-01-03 NOTE — ED Notes (Addendum)
Report from Circuit City[employer]ard rn. First contact with pt. On gurney with siderails up and bed locked and low, with call light within reach. Lying on left side in visible pain, grunting while breathing. VS taken and medicated for pain. States pain is a 9/10 when cramping present on left side, though states damage in on right side and pain radiates, 6/10 otherwise. States she hasn't had a period in 2 years after a tubal.  Respt rate regular with equal chest rise and fall.   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

## 2015-01-03 NOTE — ED Notes (Signed)
Blood samples collected/labeled and sent to the lab.

## 2015-01-03 NOTE — ED MD Progress Note (Signed)
Sign out from Dr. Sabino SnipesHeerboth     Katherine Church is a 49 year old female admitted for suspected [place] on CT. Multiple admissions for abd pain. Now Pancolitis.     Pending: NTD    Dispo: admitted

## 2015-01-03 NOTE — Progress Notes (Cosign Needed)
Medicine History and Physical     Historian: Annamary Carolin  Reliability: good    Chief Complaint:  Abdominal pain     History of Present Illness:  Katherine Church is a 49 year old female with history of HTN, DM2, hyperlipidemia, and diverticulitis who presented to the ED for severe abdomina pain that caused her to lose consciousness. The pain started yesterday morning (Monday, 5/24), and was associated with nausea and vomiting. Abdominal pains are 8/10 and characterized as spasms/cramps that occur sporadically every 5-10 minutes. They start in her RLQ, and cause intense pain and retching.  Nothing makes it feel better. Activity makes it feel worse. She reports that her self measured blood pressure was 102/60 at this time (much different than her usual HTN). Symptoms started one month ago, when she felt weak and fatigued. Last night, the vomiting/fever/pain got so bad that she passed out while she was in the bathroom; thus triggering her to go to the ED.    ROS:  Constitutional: Positive hot flashes. No fevers, chills or night sweats;  HEENT: Positive headache, LOC, confusion, lightheadedness, and change of vision.   Heme: Positive rash. No easy bruising, epistaxis   Pulmonary: No cough or dyspnea   Cardiac: No chest pain, orthopnea, PND   GI: Positive N/V/D/Constipation  GU: No menorrhea x2 years. No discharge, hematuria and dysuria   Neurological: No neuropathic pain, difficulty with coordination, no numbness, tingling.    Musculoskeletal: No myalgias or arthralgias  SKIN: Rash.   Psych: history of anxiety       Past Medical History   Diagnosis Date    Back pain     High blood pressure     Diabetes mellitus     Hyperlipidemia LDL goal < 100     Colitis, ulcerative      Past Surgical History   Procedure Laterality Date    Pb anesth,tubal ligation/transection  1994    Colonoscopy  04/2011     Repeat surveillance colo in 2019     Allergies   Allergen Reactions    Ciprofloxacin Rash    Flagyl  [Metronidazole Hcl] Rash    Sorbitol Unspecified    Tetanus Toxoids Swelling       (Not in a hospital admission)  History     Social History    Marital Status: Single     Spouse Name: N/A    Number of Children: 2    Years of Education: N/A     Occupational History    unemployed      Social History Main Topics    Smoking status: Never Smoker     Smokeless tobacco: Never Used    Alcohol Use: No    Drug Use: No      Comment: remote history of cocaine.  Currently occasional MJ smoking    Sexual Activity:     Partners: Male      Comment: tubal ligation     Other Topics Concern    Blood Transfusions No    Special Diet No    Exercise Yes     tries to walk     Social History Narrative    Two kids-adult in [place] (going thru med school [place]); son still deciding.    Born and raised in [place].    Moved to [facility] in august 2010-boyfriend works for [employer].     Family History   Problem Relation Age of Onset    Diabetes Father  Lipids Father     Hypertension Father     Hypertension Mother          Physical Exam:  Temp  Avg: 98.3 F (36.8 C)  Min: 98.1 F (36.7 C)  Max: 98.4 F (36.9 C)  Pulse  Avg: 80.8  Min: 74  Max: 90  BP  Min: 107/89  Max: 183/111  Resp  Avg: 20  Min: 18  Max: 22  SpO2  Avg: 97.4 %  Min: 96 %  Max: 100 %    Gen: In distress. In fetal position clutching stomach. cooperative.  HEENT: PERRLA, EOMI, OP clear with MMM  NECK: Supple, no LAD  CV:  RRR no m/r/g, no JVD  Resp: CTAB, no wheezes or crackles. No retractions or accessory muscle use. Speaking in full sentences.  ABD: TTP over LLQ and RLQ. Soft, ND. NABS over upper quadrants. BS- in lower quadrants.   EXT:  No cyanosis, clubbing, or edema and distal pulses normal.  NEURO: Sensation and strength grossly normal.    *No costovertebral angle tenderness.  Non tender over mcburney's point.     Labs:  Recent Labs      01/03/15   0333  01/03/15   0617   NA  138  137   K  4.0  3.8   CL  95*  97*   BICARB  20*  21*   BUN  13  11      CREAT  0.51  0.47*   CA  10.0  9.3   TP  8.7*   --    ALB  4.6   --    TBILI  0.62   --    AST  19   --    ALT  37*   --    ALK  113   --      Glucose: 297       Recent Labs      01/03/15   0333   WBC  14.7*   HGB  15.1   HCT  42.6   MCV  80.7   PLT  255   SEG  76*   LYMPHS  19   MONOS  4*   EOS  1     Lipase: 43  CRP: 0.7 (H)  HCG Qt: <1  CPK: 44  Lipase: 48  CK-MB: <1.0  CRP: 3.10 (H)  Troponin T: <0.01    Blood Gas  FIO2, Ven: 21.0  Temp, Ven: 37.0  pH, Ven (T): 7.51 (H)  pCO2, Ven (T): 28 (L)  pO2, Ven (T): 50 (H)  HCO3, Ven: 22 (L)  BE, Ven: 0.3  O2 Sat, Ven (Est): 89.3  pH, Ven (Uncorr): 7.51 (H)  pCO2, Ven (Uncorr): 28 (L)  pO2, Ven (Uncorr): 50 (H)    UA  Type: Clean catch  Color: Yellow  Appearance: Clear  Specific Gravity: 1.041 (H)  pH: 6.0  Protein: 3+ (A)  Glucose: 3+ (A)  Ketones: 1+ (A)  Leuk Esterase: Negative  Nitrite: Negative  Bilirubin: Negative  Blood: Negative  Urobilinogen: Negative  WBC: 3-5 (A)  RBC: 0-2  Squam. Epithelial Cell: 0-5(RARE)  Mucus: Rare    Historic:  Renin and Aldosterone wnl    Imaging:  01/03/15 CT Abd/Pelvis:  SMALL & LARGE BOWEL: Nearly all of the colon demonstrates wall thickening withmural stratification and with minimal adjacent stranding. Normal appendix.  BLADDER: Unremarkable  PELVIC ORGANS: Unremarkable  BONES: Degenerative changes noted along the spine. Diffuse  osteopenia noted.  SOFT TISSUES: Unremarkable      MRI/MRA Enterography 04/2011:  1. Patent aorta and mesenteric vessels. No evidence of occlusion, narrowing, or aneurysm or thickening.  2. No acute findings.    Historic:  Renal US 2012: no e/o renal artery stenosis    Assessment and Recommendations:  Katherine Church is a 49 year old female with history of HTN, DM2, and previous hospitalizations for colitis.     #ABD pain/nausea/vomit: ABD spasms, diarrhea, vomiting. Extensive history starting 2012. Failure of Abx after one month to relieve symptoms. DDx includes, ischemic vs. Infectious vs.  Autoimmune (crohn's, [place], vasculitis). Inflammatory disease (Crohn's or [place]) seems most likely given pancolitis and elevated CRP. Ischemic less likely as it usually involves watershed areas.   - repeat a rheum workup: ANA, ANCA panel, ESR, C3, C4, Anti-Cardiolipin (hx of weak positive)  -Anti-emetics w/ Phenergan, Zofran PRN  - Pain control w/ Hydromorphone PRN  - Fluid resuscitation, bolus in the ED  - Continue Unasyn  - Continue  home Bentyl    #DM2: Symptoms of lightheadedness, nausea, etc. Labs show high glucose and HbA1c. DM uncontrolled. UA shows diabetic nephropathy.  -24 glucose monitor. AM, pre lunch, pre dinner,  Bedtime  -Humalog 1-10 units with meals and nightly  -Regular insulin every 6 hours     #HTN: Patient has had HTN since 49 years old. Maxed out on all medications (clonidine, HCTZ, lisonopril, and carvedilol). Has had previous workup for secondary HTN causes, which all came back within normal limits.     -continue medications    Terrance Mass, MS3

## 2015-01-03 NOTE — ED MD Progress Note (Signed)
Sign out Dr. Casimer Leekerkson  Hx diverticulitis, ulcerative colitis  Abd pain, leukocytosis, gap  VBG and lactate pending  Repeat BMP, CT abd  Likely admit

## 2015-01-03 NOTE — ED Notes (Signed)
Pt resting on gurney on left side. Medicated for pain. Tolerating well.

## 2015-01-03 NOTE — ED Notes (Signed)
Relinquished care at this time to RN Richard K. Pt pending CT imaging. Continues to endorse severe abd spasms. Pt states nausea improved. Imaging from [facility] given to Radiology to upload into epic. Dr. Maryan Charerksen aware. Pt remains on cardiac monitor NSR, no ectopy.

## 2015-01-03 NOTE — ED Notes (Signed)
Pt presents c severe abd cramping, nausea, vomiting, diarrhea. Pt endorses loc when went to bathroom to vomit. No obvious trauma noted. Pt endorsing headache. Pt pending head and abd CT.  A&ox4, Responds appropriately, able to express needs. NAD, No neuro deficits noted. MAEx4, able to reposition self. Skin warm, approp color, dry, intact. Even, unlabored breathing. Talks in complete sentences.  Pt denies further medical concerns at this time.    Past Medical History   Diagnosis Date    Back pain     High blood pressure     Diabetes mellitus     Hyperlipidemia LDL goal < 100     Colitis, ulcerative      Bed lowered/locked, side rails up x2, call light within reach and pt oriented to use call light. Cardiac, spO2, bp monitors on and alarms audible. Pt denies further needs/concerns at this time. Aware of poc.

## 2015-01-03 NOTE — ED Notes (Signed)
Assumed care of patient. Respirations even and non-labored, CV RRR. NAD noted at present. Patient updated as to POC and voices understanding and denies current needs. Bed in low position and locked /c side rales up x 2 with call light in reach. Fluids infusing /s difficulty. Patient C/O abdominal cramping, MD notified

## 2015-01-03 NOTE — ED Notes (Signed)
Admitting MDs bedside. Antibiotics restated after pause for phenergan.

## 2015-01-03 NOTE — ED Notes (Signed)
01/03/2015 3:59 AM Katherine Church    An EKG was handed to Dr. Derrell Lollingamirez with previous

## 2015-01-03 NOTE — ED Notes (Signed)
Pt vomiting, md informed, meds ordered.

## 2015-01-03 NOTE — ED Notes (Signed)
Pt is resting on gurney, awake watching TV. States pain is still a 6/10 but that the medications has improved the pain and made it more bearable. Resp rate reg with even chest expantion. Bed locked and low with side rails up. Denies nausea at this time.

## 2015-01-03 NOTE — ED Notes (Signed)
Blood samples collected/labeled and sent to the lab. Pt medicated per mar for nausea. Will notify MD of pt abd spasms.

## 2015-01-03 NOTE — ED Notes (Signed)
Pt remains in severe abd distress. Dr. Daneil Dolinerkensen notified.

## 2015-01-03 NOTE — ED Notes (Signed)
Admitting medicine MD paged for PRN pain meds and for bowel prep order clarification.

## 2015-01-03 NOTE — Plan of Care (Signed)
Paged MD "For patient Katherine Church, G. Rm@ 634B latest BP is 166/96 and HR of 74. Pain is 7/10. Rescue dose was given at 1800 and the next pain med is not due until 2130. "

## 2015-01-03 NOTE — Plan of Care (Signed)
Paged MD :"For patient Katherine Church, G. Rm@ 634B BP has been elevated with SBP between 160-200. Patient's medication at home are clonidine, carvedilol, lisinopril, and hydralazine PRN. She have not taken her clonidine and carvedilol today. [facility]  Also, patient stated that she takes diazepam as well for her anxiety. Patient also is diabetic. she is on clear liquid diet and will be NPO after midnight. Do you want to start patient on dextrose fluids? Please advise. THank you. "

## 2015-01-03 NOTE — Consults (Signed)
Gastroenterology Consultation  Fellow: Gerrit Heck  Consulting Physician: Dr. Tawnya Crook  Requesting Physician: Olean Ree., MD    Reason For Consultation:     Thank you Dr. Ronalee Red, Ike Bene., MD for requesting this consultation.    Chief Complaint:   Abdominal pain    History of Present Illness:   Patient is a 49 year old female with a past medical history significant for multiple hospital stays for ischemic colitis, HTN, DM type II on insulin, presenting with abdominal pain, nausea, vomiting, diarrhea, found with pancolitis, with GI consulted to evaluate acute colitis.    Last episode of abdominal pain 04/14, with constipation and hematochezia, at [facility] found with diverticulitis on ct scan treated with antibiotics. Pain didn't improve she had repeat ct that was inconclusive per pt. Constant dull pain with cramps, up to 9/10, lower abdomen, radiating to the right. Episode similar with the episodes in the past. Nausea and vomiting. She had constipation initially, now with diarrhea since yesterday, 4-5 loose bm's.    Since 2012 she had more episodes once or twice a year, lasting for up to a week improving with antibiotics, hospitalized twice in [place] found with diverticulitis treated with antibiotics. She is feeling better between the episodes.    She endorses fatigue. She was recently started on paxel, she is also on valium. No joint pain but she reports a round pruritic rash upper extremities and chest, that now is resolved.     She had her last colonoscopy12/ 2015 with findings of diverticuli.   She denies NSAID's, ASA, blood thinners.    Per dr. Laurence Ferrari note:   Was admitted to hospital b/w 05/01/11 and 05/06/11 after presenting with """acute, episodic epigastric and RLQ abdominal pain and spasms partially alleviated by the passage of loose stools and hematochezia, as well as nausea and vomiting. She has a hx of ischemic colitis diagnosed in 08/2009 and was last hospitalized from 8/28-04/19/11 with  similar symptoms and a diagnosis of NSAID colopathy. Following her last hospitalization, she stopped taking NSAIDs and increased her exercise without alleviation of symptoms.Her CT abdomen/pelvisin 9/12 showed no evidence of colitis or bowel obstruction. Colonoscopy showed sigmoid inflammation consistent with colonic ischemia, but no strictures or ulcerations. IBD was less likely based on colonoscopy and biopsies. Inpt GI team was consulted on the pt, and diagnosed her with ischemic colitis based on her history as well as elevated lactate (32.6) and WBC (10.1) on admission. 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 Other DRVVT tests, AT III, and protein C/S tests were within normal limits""".    At that time a rheumatology consultation was asked to evaluate for possibility of vasculitis, and for weak + lupus anticoagulant.     She was seen by rheumatology service per note: Her history is not suggestive of ANCA associated vasculitis and she has no prior history to height probability of thrombotic vasculopathy other than severe poorly controlled HTN and diabetes. Finally no evidence of sle by history and PAN less likely in this age group and negative angiogram. Our suspicion for vasculitis/vasculopathy in her is very low and unlikely the cause of her colitis. No further testing is necessary presently.    At the ED found with pancolitis on the CT scan, vitals stable, leukocytosis. Anion gap of 23, CRP 3.1, lactate 2.4.  Gastrointestinal Review of Symptoms:   Gas: no   Stomach cramping/pain: yes   Bloating: no   Heartburn: no   Nausea: no   Dysphagia: no   Odynophagia: no  Altered bowel habits: no    Diarrhea: yes   Constipation: yes   Melena: no   Hematochezia: yes  Hematemesis:  no  Weight loss: no     Current Problem List:   Patient Active Problem List    Diagnosis Date Noted    Pancolitis ([place]) 01/03/2015    Sepsis(995.91) 10/06/2012     IMO Update      Cellulitis 10/06/2012    Tubular adenoma polyp of rectum 04/26/2011      During colonoscopy (03/2011) a biopsy of rectosigmoid region revealed a tubular adenoma. She was advised to follow up with GI for a repeat colonoscopy in 3-5 years.        Myofascial pain 10/07/2010    Piriformis syndrome 10/07/2010    Screening for breast cancer 03/02/2010    Screening for malignant neoplasm of breast 09/14/2009    Acute ischemic colitis 09/08/2009    Malignant hypertension 05/22/2009    Retinal hemorrhage 05/22/2009    Diabetes 05/18/2009    Hypertensive disorder 05/18/2009    Chronic back pain 05/18/2009    Anxiety 05/18/2009       Past Medical History:  Past Medical History   Diagnosis Date    Back pain     High blood pressure     Diabetes mellitus     Hyperlipidemia LDL goal < 100     Colitis, ulcerative        Past Surgical History:  Past Surgical History   Procedure Laterality Date    Pb anesth,tubal ligation/transection  1994    Colonoscopy  04/2011     Repeat surveillance colo in 2019       Social History:  History     Social History    Marital Status: Single     Spouse Name: N/A    Number of Children: 2    Years of Education: N/A     Occupational History    unemployed      Social History Main Topics    Smoking status: Never Smoker     Smokeless tobacco: Never Used    Alcohol Use: No    Drug Use: No      Comment: remote history of cocaine.  Currently occasional MJ smoking    Sexual Activity:     Partners: Male      Comment: tubal ligation     Other Topics Concern    Blood Transfusions No    Special Diet No    Exercise Yes     tries to walk     Social History Narrative    Two kids-adult in [place] (going thru med school [place]); son still deciding.    Born and raised in [place].    Moved to [facility] in august  2010-boyfriend works for [employer].       Family History:  Family history of colorectal cancer or inflammatory bowel disease:  Family History   Problem Relation Age of Onset    Diabetes Father     Lipids Father     Hypertension Father     Hypertension Mother        Allergy:  Allergies   Allergen Reactions    Ciprofloxacin Rash    Flagyl [Metronidazole Hcl] Rash    Sorbitol Unspecified    Tetanus  Toxoids Swelling       Current Medications:  Current Facility-Administered Medications   Medication    HYDROmorphone (DILAUDID) injection 1 mg    ondansetron (ZOFRAN) injection 4 mg     Current Outpatient Prescriptions   Medication Sig    [DISCONTINUED] amLODIPINE (NORVASC) 10 MG tablet Take 1 tablet by mouth daily.    amoxicillin-clavulanate (AUGMENTIN) 875-125 MG per tablet Take 1 tablet by mouth every 12 hours.    atorvastatin (LIPITOR) 20 MG tablet Take 1 tablet by mouth daily.    blood glucose meter Use as directed.    CARVEDILOL PO     diazepam (VALIUM) 5 MG tablet Take 5 mg by mouth every 12 hours as needed.    dicyclomine (BENTYL) 10 MG capsule Take 1 capsule by mouth 3 times daily (before meals).    docusate sodium (COLACE) 250 MG capsule Take 1 capsule by mouth 2 times daily.    docusate sodium (COLACE) 250 MG capsule Take 1 capsule by mouth 2 times daily.    Gauze Pads & Dressings (CURITY IODOFORM PACKING STRIP) MISC Apply 1 bottle topically daily.    Gauze Pads & Dressings (GAUZE DRESSING) 4"X4" PADS Apply 1 Application topically daily.    glucose blood test strip 1 strip by Other route 4 times daily (before meals and nightly).    HYDROCHLOROTHIAZIDE PO     HYDROcodone-acetaminophen (NORCO) 5-325 MG tablet Take 1 tablet by mouth every 6 hours as needed for Moderate Pain (Pain Score 4-6).    HYDROmorphone (DILAUDID) 2 MG tablet Take 1.5 tablets by mouth every 4 hours as needed for Moderate Pain (Pain Score 4-6) or Severe Pain (Pain Score 7-10).    insulin glargine (LANTUS) 100  UNIT/ML injection Inject 36 Units under the skin nightly.    insulin lispro (HUMALOG) 100 UNIT/ML injection Inject 12 Units under the skin 3 times daily (before meals).    Insulin Syringe-Needle U-100 31G X 5/16" 0.3 ML MISC Use one syringe with each insulin administration.    lancets 1 Lancet by Other route 4 times daily (before meals and nightly).    lansoprazole (PREVACID SOLUTAB) 30 MG disintegrating tablet Take 1 tablet by mouth daily.    lisinopril (PRINIVIL, ZESTRIL) 40 MG tablet Take 1 tablet by mouth 2 times daily.    lisinopril (PRINIVIL, ZESTRIL) 40 MG tablet Take 1 tablet by mouth 2 times daily.    omeprazole (PRILOSEC) 20 MG capsule Take 20 mg by mouth daily.    ondansetron (ZOFRAN ODT) 4 MG disintegrating tablet Take 1 tablet by mouth every 8 hours as needed for Nausea.    ondansetron (ZOFRAN ODT) 4 MG disintegrating tablet Take 1 tablet by mouth every 6 hours as needed for Nausea.    oxyCODONE-acetaminophen (PERCOCET) 5-325 MG per tablet Take 1 tablet by mouth every 6 hours as needed for Severe Pain (Pain Score 7-10).    polyethylene glycol (MIRALAX) packet Take 1 packet by mouth daily.    senna (SENOKOT) 8.6 MG tablet Take 1 tablet by mouth daily.    senna (SENOKOT) 8.6 MG tablet Take 1 tablet by mouth daily.       Review of Systems:  Constitutional: weakness  Eyes: Negative for vision changes   Ears/Throat: Negative for hearing loss or changes, sore throat, or hoarseness   CV: Negative for palpitations, orthopnea, or chest pain   Respiratory: Negative for cough, shortness of breath, sputum, or wheezing   Genitourinary: Negative for dysuria, frequency, urgency, nocturia, or hematuria   GI:  See HPI/subjective above   Musculoskeletal: Negative for arthalgia, joint pain, or back problems   Skin: rash   Neuro: Negative for syncope, seizure, or dizziness   Psych: anxiety   Endocrine: diabetes  Allergic/Immunologic: See allergy list    Physical examination:  BP 183/111 mmHg   Pulse 78    Temp(Src) 98.1 F (36.7 C)   Resp 22   Ht $R'5\' 4"'ni$  (1.626 m)   Wt 66.633 kg (146 lb 14.4 oz)   BMI 25.20 kg/m2   SpO2 100%   LMP 06/02/2011 Body mass index is 25.2 kg/(m^2).  Wt Readings from Last 2 Encounters:   01/03/15 66.633 kg (146 lb 14.4 oz)   12/01/13 72.576 kg (160 lb)      Blood Pressure   01/03/15 183/111   12/02/13 170/102     Temperature:  [98.1 F (36.7 C)-98.4 F (36.9 C)] 98.1 F (36.7 C) (05/25 0730)  Blood pressure (BP): (107-183)/(89-111) 183/111 mmHg (05/25 1108)  Heart Rate:  [78-90] 78 (05/25 1108)  Respirations:  [18-22] 22 (05/25 1108)  Pain Score:  [-] 9 (05/25 1108)  O2 Device:  [-] None (Room air) (05/25 1108)  SpO2:  [96 %-100 %] 100 % (05/25 1108)    General: obese and in no apparent distress.   Affect: Normal, pleasant  HEENT: NC/AT, no scleral icterus, mucus membranes moist.   Respiratory: Clear to auscultation bilaterally. Normal respiratory effort.   Cardiovascular: Regular rate and rhythm without murmurs, rubs or gallops.   Abdomen: Soft. Mild tender on palpation lower abdomen and RLQ. Bowel sound present. Non-distended. No rebound. No guarding.  Extremities: No peripheral edema. Warm and well-perfused.   Skin: No rashes, no jaundice.  Lymphatics: No cervical, axillary or inguinal lymphadenopathy.   Neuro: Alert and oriented X3, no asterixis.    Labs:  I have reviewed the labs listed below.    CBC  Lab Results   Component Value Date    WBC 14.7 01/03/2015    HGB 15.1 01/03/2015    HCT 42.6 01/03/2015    MCV 80.7 01/03/2015    PLT 255 01/03/2015    PLT 219 05/21/2009     Chemistry  Lab Results   Component Value Date    NA 137 01/03/2015    K 3.8 01/03/2015    CL 97 01/03/2015    BICARB 21 01/03/2015    BUN 11 01/03/2015    CREAT 0.47 01/03/2015    GLU 297 01/03/2015     Liver  Lab Results   Component Value Date    TBILI 0.62 01/03/2015    DBILI <0.1 06/29/2013    AST 19 01/03/2015    ALT 37 01/03/2015    ALK 113 01/03/2015    ALB 4.6 01/03/2015     Coags  Lab Results   Component  Value Date    INR 1.0 05/12/2013    PTT 31.1 05/12/2013       Images:  I have reviewed the imaging reports listed below.      CT abd pel 01/03/2015    CT scan with IV contrast    1). Pan colitis, likely from ulcerative colitis flare.    2). Hepatomegaly.    3). Final results discussed with Dr. Marcelina Morel 01/03/2015 at 10:46.    Endoscopy:    Colonoscopy 04/2011    Findings: 1- Normal Ascending colon except for nonspecific  patchy erythema.. Colonoscope could not be passed beyond  ascending colon secondary to stools. Biopsies were taken  (jar 1)  from erythema in Right colon.  2- Normal transverse colon.  3- Descending colon was erythematous and punctate  erythema, w/ scant washable bloody mucosa, was seen from  splenic flexure till rectosigmoid junction. Biopsies were  taken (35-30cm) (jar 2), .  4- Distal Sigmoid/rectum. There was small amount of old  blood present in rectum. Rare erythema. vascular pattern  overall intact. Biopsies taken from rectosigmoid (jar 3)      Endoscopic Diagnosis: Predominantly sigmoid inflammation  consistent with colon ischemia in setting of history, and  previously elevated lactate. There was no stricture or  ulceration seen. Follow up biopsies to help exclude less  likely Crohn's colitis.    Recommendations: -- Will work up for coagulopathy like  antiphospholipid syndrome, Protein C/S deficiency.  - Consider Vascular Consult, given repeated episodes of  likely ischemic colitis without known etiology.  -- Avoid NSAIDS  -- Avoid hypotension  - analgesia, keep well hydrate IV fluids.  -- Recommend checking C diff, treat if positive.  - Start clear liquid diet.      Pathology:    FINAL PATHOLOGIC DIAGNOSIS:  A: Colon, right, biopsy   -Focal minimal acute colitis, see comment.  B: Colon, 30 to 35 cm, biopsy   -Benign colonic mucosa with focal minimal acute colitis and lamina   propria fibrosis, see comment.  C: Rectosigmoid colon, biopsy   -Focal mild acute colitis,  see comment.  COMMENT: For parts A, B, and C, there is focal acute cryptitis without  evidence of architectural distortion. The differential diagnosis includes  an acute infectious colitis, ischemic colitis, drug injury including  NSAIDs, early changes of inflammatory bowel disease, and bowel preparation  injury. For part B, there are foci of lamina propria fibrosis with focal  withered appearing glands. The findings are suggestive of ischemia. These  superficial mucosal biopsies do not show larger vessels to evaluate for  vasculitis or thrombosis. Please correlate clinically.      Assessment and Plan:  Patient is a 49 year old female with a past medical history significant for multiple hospital stays for ischemic colitis, HTN, DM type II on insulin, presenting with abdominal pain, nausea, vomiting, diarrhea, found with pancolitis, with GI consulted to evaluate acute colitis.    Abdominal pain/pancolitis:  Unclear etiology for her pancolitis, differential is broad at this time, including infectious colitis, IBD, medication induced, eosinophilic, less likely ischemic.   - infectious work-up including: blood cx, ua, ucx, stool studies including c diff.   - continue antibiotics  - Correction of electrolyte abnormalities   - IV fluids  - pain control per primary team  - plan for colonoscopy tomorrow  - Clear liquid diet today  - NPO past midnight  - Start golytely 4 L bowel prep at 6 PM tonight, give extra 2 lts tomorrow morning   - Instructions to make prep more palatable (please inform RN): Mix Golytely with Crystal Light LEMONADE FLAVOR ONLY, chill with ice, drink with straw. Split prep: 4 L to drink between 9 PM-midnight tonight, then drink additional 2 L in morning between 4-6 AM.    - we would consider MRE after colonoscopy based on the findings    Thank you for the consult.  Please page with any questions.    Patient discussed with attending, Dr. Tawnya Crook    Electronically Signed:   Gerrit Heck,  MD  GI & Hepatology Fellow  01/03/2015  1:09 PM          [place]  Subjective    Reason for consultation:  pancolitis    History of present illness:  49 yo f with hx of colitis since 2011.  Intermittent episodes of pain and diarrhea lasting few days to week.  This time had some blood in stool as well.  Multiple colonoscopies and CT scans.  Biopsies have been suspicious for ischemic colitis or nsaid colopathy.  This CT shows pancolitis.  CRp is 3, esr normal.  Mild leukocytosis.    See resident / fellow history and physical for further details of the patient's history.    Objective    I have examined the patient and concur with the fellow exam.    Assessment and Plan    I agree with the fellow care plan.    See the resident / fellow history and physical for further details.    Pancolitis with episodes of abd pain, diarrhea for years.  Suspicious for [place] but has never had findings of chronicity on biopsies even over couple years.  No eosinophilia.  Pancolitis distribution is unusual in ischemic colitis.  Rec repeat colonoscopy to see if any new features seen on biopsies.  -check stool pathogens  -colonoscopy

## 2015-01-03 NOTE — ED Notes (Signed)
Pt states nausea and vomiting starting to feel better.

## 2015-01-03 NOTE — H&P (Addendum)
Medicine Resident History and Physical  Date of Admission: 01/03/2015    CC: abdominal pain    History of Present Illness:     Mr. Katherine Church is a 49 year old man with pmh of HTN, DM2, anxiety, diverticulitis, and ischemic colitis who presents to the ED with generalized fatigue, nausea, vomiting and abdominal pain. For the past month, she has been severely fatigued, now reports a struggle just to walk her dog. She denies chest pain, DOE, orthopnea, PND or LE edema. She cannot differentiate between fatigue and weakness, says she has both. She denies change in weight.    About 2 days ago, she developed acute abdominal pain, most severe in the RLQ. The pain is intermittent, colicky, squeezing, and 10+/10 with attacks. She denies radiation. She also developed nausea and has had multiple bouts of bilious but non-blood emesis. She denies recent diarrhea, constipation, hematochezia, dyschezia, or mucus per rectum. She also denies hematuria, foul smelling urine, or flank pain.    She has a long history of GI problems. Most recently seen gastroenterology at [facility] in 2013 (Dr. Madaline Savage). She reportedly has had multiple episodes of ischemic colitis (though not confirmed) MRI/MRA was without large vessel disease. Dr. Madaline Savage thought a vasculitis was possible causing small vessel disease. Biopsy results were c/w colitis, but not necessarily ulcerative colitis. She was evaluated by rheum and had a weakly positive ANA, but they were not impressed w/ a vasculitis cause. Notably she showed the author photos of previous BMs w/ gross hematochezia. She denies recent hematochezia. She is unable to tolerate food or liquids.    She has no known hx of CAD but reports severe hereditary, essential hypertension. She also reports multiple 2nd degree relatives w/ "autoimmune disease."    She denies fevers, chills, vision changes, chest pain, SOB, dysphagia, rash.    When she presented to the ED, she was afebrile and HD stable. She had an anion  gap of 23, CRP 3, lactate 2.4, leukocytosis. Beta-hydroxybutyric acid negative. UA w/ glucosuria and proteinuria, otherwise bland. CT scan revealed pancolitis. She was given 3L NS bolus, antiemetics, and Unasyn.     Review of Systems: A 14 point ROS  was completed and notable for:  +: headache, abd pain, n, v  -:  Diarrhea, hematochezia, constipation, melena, hematuria, dysuria, flank pain.    Recent Hospitalizations:   2 weeks prior at unknown OSH w/ similar symptoms, discharged from ED after anti-emetics.    Past Medical History:  Past Medical History   Diagnosis Date    Back pain     High blood pressure     Diabetes mellitus     Hyperlipidemia LDL goal < 100     Colitis, ulcerative      Past Surgical History:  Past Surgical History   Procedure Laterality Date    Pb anesth,tubal ligation/transection  1994    Colonoscopy  04/2011     Repeat surveillance colo in 2019     Social History:   Lives: home is in [place], [place]; temporarily in [place], has 50 yo adopted daughter  Work: unemployed  Smoking Hx: denies  Etoh Hx: denies  Drug Hx: denies, specifically, no cocaine    Family History:   Heart Disease: mother and maternal grandparents w/ severe HTN  Diabetes: multiple family members  Cancer: Colon cancer in cousin in 61s    Home Medications:  Carvedilol  HCTZ  Bentyl  Colace  Lantus 36 units nightly  Aspart 12 tid w/ meals  Lanzoprazole    Allergies:  Ciprofloxacin  Flagyl  Sorbitol  Tetanus Toxoids    Objective  Vitals:  Temperature:  [98.1 F (36.7 C)-98.4 F (36.9 C)] 98.1 F (36.7 C) (05/25 0730)  Blood pressure (BP): (107-183)/(89-111) 183/111 mmHg (05/25 1108)  Heart Rate:  [78-90] 78 (05/25 1108)  Respirations:  [18-22] 22 (05/25 1108)  Pain Score:  [-] 9 (05/25 1108)  O2 Device:  [-] None (Room air) (05/25 1108)  SpO2:  [96 %-100 %] 100 % (05/25 1108)    05/24 0600 - 05/25 0559  In: 1000 [I.V.:1000]  Out: -     Weights (last 14 days)     Date/Time Weight Who    01/03/15 04:11:53 66.633 kg (146 lb  14.4 oz) SM            Exam:   Gen: Alert, cooperative, visibly in distress  Head: pink conjunctiva, non-icteric sclera, PEERL, EOMI, MMM, no OP lesions  Neck: No masses, LAD or thyromegaly  Heart: RRR, no m,r,g, no JVP or carotid bruits  Lungs: CTAB no crackles, rales, or wheezes, no increased work of breathing  Abd: hypoactive bowel sounds, -peritoneal signs, very TTP RLQ to mild palpation, otherwise minimal TTP  Ext: warm and well perfused, peripheral pulses intact, no LE edema  Neuro: A&Ox3, CN II-IX intact, strength and sensation grossly intact  Skin: No scars, rash, bruising, jaundice or dry skin.  MSK: No muscle wasting or musculoskeletal pain.    Labs reviewed and notable for:  Lab Results   Component Value Date    WBC 14.7 01/03/2015    RBC 5.28 01/03/2015    HGB 15.1 01/03/2015    HCT 42.6 01/03/2015    MCV 80.7 01/03/2015    MCHC 35.4 01/03/2015    RDW 13.2 01/03/2015    PLT 255 01/03/2015    PLT 219 05/21/2009    MPV 9.6 01/03/2015     Lab Results   Component Value Date    NA 137 01/03/2015    K 3.8 01/03/2015    CL 97 01/03/2015    BICARB 21 01/03/2015    BUN 11 01/03/2015    CREAT 0.47 01/03/2015    GLU 297 01/03/2015    Ursa 9.3 01/03/2015     Lab Results   Component Value Date    COLORUA Yellow 01/03/2015    APPEARUA Clear 01/03/2015    GLUCOSEUA 3+* 01/03/2015    BILIUA Negative 01/03/2015    KETONEUA 1+* 01/03/2015    SGUA 1.041* 01/03/2015    BLOODUA Negative 01/03/2015    PHUA 6.0 01/03/2015    PROTEINUA 3+* 01/03/2015    UROBILUA Negative 01/03/2015    NITRITEUA Negative 01/03/2015    LEUKESTUA Negative 01/03/2015    WBCUA 3-5* 01/03/2015    RBCUA 0-2 01/03/2015    HYALINEUA 0-2 06/29/2013     CRP 3.1  Lactate 2.5  B-hydroxybutyrate wnl    Lab Results   Component Value Date    PH 7.51* 01/03/2015    PO2 50* 01/03/2015    PCO2 28* 01/03/2015     Historic:  Renin and Aldosterone wnl    Micro:  None    EKG/Echo:  None    Imaging   01/03/15 CT Abd/Pelvis:  SMALL & LARGE BOWEL: Nearly all of the colon  demonstrates wall thickening withmural stratification and with minimal adjacent stranding. Normal appendix.  BLADDER: Unremarkable  PELVIC ORGANS: Unremarkable  BONES: Degenerative changes noted along the spine. Diffuse osteopenia noted.  SOFT  TISSUES: Unremarkable    Historic:  Renal US 2012: no e/o renal artery stenosis    MRI/MRA Enterography 04/2011:  1. Patent aorta and mesenteric vessels. No evidence of occlusion, narrowing, or aneurysm or thickening.  2. No acute findings.    ASSESSMENT AND PLAN  Mr. Peddie is a 49 year old man with pmh of HTN, DM2, anxiety, diverticulitis, and ischemic colitis who presents to the ED with generalized fatigue, nausea, vomiting and abdominal pain.    Acute Hospital Problems:   # Nausea, Vomiting, Abdominal Pain: uncertain etiology, last followed by GI here in 2013, at that time, MRI/MRA was w/o large vessel disease but there was some concern for small vessel vs vasculitis. Saw Rheum and w/ minimally positive ANA (1:40), negative cardiolipin. CT today w/ e/o pancolitis, also CRP elevation. Lipase negative, thus pancreatitis unlikely. Admit to med/surg primarily due to PO intolerance.  - Anti-emetics w/ Phenergan, Zofran PRN  - Pain control w/ Hydromorphone PRN  - Fluid resuscitation, bolus in the ED  - Con't Unasyn  - Will repeat a rheum workup: ANA, ANCA panel, ESR, C3, C4, Anti-Cardiolipin (hx of weak positive)  - Con't home Bentyl    # Generalized Fatigue/Weakness: possibly related to systemic illness, query vasculitis vs IBD.  - see above vasculitis w/u    # Respiratory Alkalosis w/ Anion Gap: minimally elevated lactate, negative beta-hydroxybutyrate, no reported injections, not uremic. Respiratory alkalosis likely due to hyperventilation from pain, uncertain etiology of anion gap, improving w/ fluids.  - control bp and abx as above and below    # Essential HTN: has had workup for secondary causes including renin/aldo, cortisol, urine metanephrines and renal US w/  dopplers, all wnl. Likely essential HTN.  - Continue Lisinopril and HCTZ  - PRN Hydral for SBP >160    # Insulin-Dependent DM2: on glargine 36 units qhs, and aspart 12 units tid at home.  - a1c  - Glargine 30 units  - Holding mealtime aspart as she is vomiting  - Medium SSI    Nutrition: Diet Clear Liquid  Electrolytes: replacing as needed  DVT PPx: lovenox  Constipation PPx: senna, miralax  GI PPx: lanzoprazole  Dispo: med/surg for workup, likely >2 nights  Code Status: full code    This patient was seen and discussed with Olean Ree., MD who agrees with the plan.    Stefanie Libel) Georgie Chard, MD PGY1   [facility]  Pager [phone]      Colonoscopy tomorrow per GI, CLD now, NPO @ [place], holding enoxaparin, 4L GoLytely tonight, 2L in AM, C scope around 4pm on 5/26.    Odelle Kosier "Kathrin Penner MD  Internal Medicine PGY-1  [phone]

## 2015-01-03 NOTE — ED MD Progress Note (Signed)
Pending ct abd, lactate, vbg, repeat bmp. Given 3rd L IVF

## 2015-01-03 NOTE — ED MD Progress Note (Signed)
CT pan-colitis, most consistent with [place]  Will admit to medicine for further work up; continues to have severe pain and nausea

## 2015-01-03 NOTE — ED Notes (Signed)
Straight cath patient with sterile technique. Urine sample sent to lab.

## 2015-01-03 NOTE — ED MD Progress Note (Signed)
Labs notable for leukocytosis 14.7, crp 3.10 nl sed rate, gap 23, bicarb 20 pending vbg to assess pH    Pt s/p 1L LR will give second L and bentyl for abd cramping as pain minimally improved with dilaudid. Pt no longer vomiting after phenergan and zofran.     Pending CT abd/pelvis

## 2015-01-03 NOTE — ED Provider Notes (Signed)
Emergency Department Note  [facility] electronic medical record reviewed for pertinent medical history.     Nursing Triage Note:   Chief Complaint   Patient presents with    Vomiting     Pt bibm for nausea/vomiting/diarrhea. Hx of diverticulitis. Pt endorsing 10/10 lower abd pain. Per medic report, while pt vomiting, she had +loc so she activated 911. Pt endorses head pain. No obvious s/s of trauma.        HPI:   49 yo female PMH significant for htn, DM2 (A1C 10), HLD, ulcerative colitis p/w n/v and syncope. Sx's started 1 day ago as nausea, severe diarrhea with multiple episodes of brown loose stool per day as well as 1 day of intractable nausea and vomiting. Pt endorses pain in epigastric region and diffuse abd pain. Had a syncopal episode today while vomiting so called 911. No trauma, no head or neck pain, no preceding cp or sob but did feel light headed prior to event. Given zofran en route and ivf with no alleviation of sx's. No fevers or chills, + fatigue, + n/v/d, no sick contacts, no recent travel, no recent abx    HPI    Past Medical History   Diagnosis Date    Back pain     High blood pressure     Diabetes mellitus     Hyperlipidemia LDL goal < 100     Colitis, ulcerative        Past Surgical History   Procedure Laterality Date    Pb anesth,tubal ligation/transection  1994    Colonoscopy  04/2011     Repeat surveillance colo in 2019       Family History:      Family History   Problem Relation Age of Onset    Diabetes Father     Lipids Father     Hypertension Father     Hypertension Mother        Social History:      History   Substance Use Topics    Smoking status: Never Smoker     Smokeless tobacco: Never Used    Alcohol Use: No       Medications:   Prior to Admission Medications   Prescriptions Last Dose Informant Patient Reported? Taking?   Gauze Pads & Dressings (CURITY IODOFORM PACKING STRIP) MISC   No No   Sig: Apply 1 bottle topically daily.   Gauze Pads & Dressings (GAUZE DRESSING) 4"X4"  PADS   No No   Sig: Apply 1 Application topically daily.   HYDROcodone-acetaminophen (NORCO) 5-325 MG tablet   No No   Sig: Take 1 tablet by mouth every 6 hours as needed for Moderate Pain (Pain Score 4-6).   HYDROmorphone (DILAUDID) 2 MG tablet   No No   Sig: Take 1.5 tablets by mouth every 4 hours as needed for Moderate Pain (Pain Score 4-6) or Severe Pain (Pain Score 7-10).   Insulin Syringe-Needle U-100 31G X 5/16" 0.3 ML MISC   No No   Sig: Use one syringe with each insulin administration.   amoxicillin-clavulanate (AUGMENTIN) 875-125 MG per tablet   No No   Sig: Take 1 tablet by mouth every 12 hours.   atorvastatin (LIPITOR) 20 MG tablet   No No   Sig: Take 1 tablet by mouth daily.   blood glucose meter   No No   Sig: Use as directed.   diazepam (VALIUM) 5 MG tablet   Yes No   Sig: Take 5 mg  by mouth every 12 hours as needed.   dicyclomine (BENTYL) 10 MG capsule   No No   Sig: Take 1 capsule by mouth 3 times daily (before meals).   docusate sodium (COLACE) 250 MG capsule   No No   Sig: Take 1 capsule by mouth 2 times daily.   docusate sodium (COLACE) 250 MG capsule   No No   Sig: Take 1 capsule by mouth 2 times daily.   glucose blood test strip   No No   Sig: 1 strip by Other route 4 times daily (before meals and nightly).   insulin glargine (LANTUS) 100 UNIT/ML injection   No No   Sig: Inject 36 Units under the skin nightly.   insulin lispro (HUMALOG) 100 UNIT/ML injection   No No   Sig: Inject 12 Units under the skin 3 times daily (before meals).   lancets   No No   Sig: 1 Lancet by Other route 4 times daily (before meals and nightly).   lansoprazole (PREVACID SOLUTAB) 30 MG disintegrating tablet   No No   Sig: Take 1 tablet by mouth daily.   lisinopril (PRINIVIL, ZESTRIL) 40 MG tablet   No No   Sig: Take 1 tablet by mouth 2 times daily.   lisinopril (PRINIVIL, ZESTRIL) 40 MG tablet   No No   Sig: Take 1 tablet by mouth 2 times daily.   omeprazole (PRILOSEC) 20 MG capsule   Yes No   Sig: Take 20 mg by mouth  daily.   ondansetron (ZOFRAN ODT) 4 MG disintegrating tablet   No No   Sig: Take 1 tablet by mouth every 6 hours as needed for Nausea.   ondansetron (ZOFRAN ODT) 4 MG disintegrating tablet   No No   Sig: Take 1 tablet by mouth every 8 hours as needed for Nausea.   oxyCODONE-acetaminophen (PERCOCET) 5-325 MG per tablet   No No   Sig: Take 1 tablet by mouth every 6 hours as needed for Severe Pain (Pain Score 7-10).   polyethylene glycol (MIRALAX) packet   No No   Sig: Take 1 packet by mouth daily.   senna (SENOKOT) 8.6 MG tablet   No No   Sig: Take 1 tablet by mouth daily.   senna (SENOKOT) 8.6 MG tablet   No No   Sig: Take 1 tablet by mouth daily.      Facility-Administered Medications: None       Allergies: Ciprofloxacin; Flagyl; Sorbitol; Steroids; and Tetanus toxoids    Review of Systems:   ROS negative except as per HPI     Review of Systems  All other systems reviewed and negative unless otherwise noted in the HPI or above. This was done per my custom and practice for systems appropriate to the chief complaint in an emergency department setting and varies depending on the quality of history that the patient is able to provide.      Physical Exam:   01/05/15  0045 01/05/15  0437 01/05/15  0720 01/05/15  0803   BP:  167/94 148/98 194/107   Pulse:  71 75 68   Temp:  98.5 F (36.9 C) 98.7 F (37.1 C)    Resp: SpO2:  97% 95%      Nursing note and vitals reviewed.     Physical Exam    Gen: WDWN, alert and conversant, mod distress 2/2 vomiting  HEENT: NCAT, PERRLA, EOMI, sclera non-icteric, MM dry  Neck:  No masses  CH: CTA bilaterally, no chest wall tenderness  CV: RRR, nl S1, S2, no S3, S4  ABD: +BS x4, generalized abd pain, no rebound, masses, or guarding  Neuro: A&Ox3, moving all extremities equally  Extremities: Non-tender, 2+ pedal pulses   Skin: warm and dry, normal color    Impression & Initial ED Plan:    49 yo female PMH significant for htn, DM2 (A1C 10), HLD, ulcerative colitis p/w n/v/d and  syncope. Sx's concerning for [place] flair vs GI infection. Pt has h/o ischemic colitis as well. Syncope likely 2/2 severe dehydration and volume loss from vomiting and diarrhea vs vasovagal syncope- low concern for acs, pe, stroke, no e/o trauma. Will tx symptomatically, screening labs, ct abd, serial abd exams. Anticipate admission      The rest of the ED course, results, and plan for the patient is in a separate continuation note. Please see that note for details.       I have discussed my evaluation and care plan for the patient with the attending physician Dr. Hilary Hertzamirez      Malaisha Silliman, Jodene NamBrenna, MD  Resident  01/05/15 [id number]    Levora Dredgeerksen, Venida Tsukamoto, MD  Resident  01/05/15 [id number]    Gloris Manchesteramirez, Gilbert H, MD  01/05/15 1227

## 2015-01-03 NOTE — ED Notes (Signed)
Pt ambulated to BR with steady gait. Moving exacerbated pt's pain, she's moaning, grimacing & guarding abd. Also c/o nausea. Medicated for pain.

## 2015-01-04 ENCOUNTER — Encounter (HOSPITAL_COMMUNITY): Admission: EM | Disposition: A | Discharge status: Home Health | Payer: Self-pay | Attending: Hospitalist

## 2015-01-04 HISTORY — PX: COLONOSCOPY PROCEDURE: GI45378

## 2015-01-04 LAB — BASIC METABOLIC PANEL, BLOOD
Anion Gap: 17 mmol/L — ABNORMAL HIGH (ref 7–15)
BUN: 4 mg/dL — ABNORMAL LOW (ref 6–20)
Bicarbonate: 24 mmol/L (ref 22–29)
Calcium: 8.6 mg/dL (ref 8.5–10.6)
Chloride: 103 mmol/L (ref 98–107)
Creatinine: 0.56 mg/dL (ref 0.51–0.95)
GFR: >60 mL/min
Glucose: 125 mg/dL — ABNORMAL HIGH (ref 70–115)
Potassium: 3.3 mmol/L — ABNORMAL LOW (ref 3.5–5.1)
Sodium: 144 mmol/L (ref 136–145)

## 2015-01-04 LAB — HEMOGRAM, BLOOD
Hct: 35.8 % (ref 34.0–45.0)
Hgb: 12.1 gm/dL (ref 11.2–15.7)
MCH: 28.2 pg (ref 26.0–32.0)
MCHC: 33.8 % (ref 32.0–36.0)
MCV: 83.4 um3 (ref 79.0–95.0)
MPV: 9.4 fL (ref 9.4–12.4)
Plt Count: 184 10*3/uL (ref 140–370)
RBC: 4.29 10*6/uL (ref 3.90–5.20)
RDW: 13.5 % (ref 12.0–14.0)
WBC: 7.6 10*3/uL (ref 4.0–10.0)

## 2015-01-04 LAB — PHOSPHORUS, BLOOD: Phosphorous: 5.2 mg/dL — ABNORMAL HIGH (ref 2.7–4.5)

## 2015-01-04 LAB — MAGNESIUM, BLOOD: Magnesium: 2.1 mg/dL (ref 1.7–2.6)

## 2015-01-04 LAB — CARDIOLIPIN ANTIBODY, IGG, BLOOD: Cardiolipin IgG, Ab: <10 [GPL'U] (ref 0–14)

## 2015-01-04 LAB — GLUCOSE (POCT)
Glucose (POCT): 125 mg/dL — ABNORMAL HIGH (ref 70–115)
Glucose (POCT): 208 mg/dL — ABNORMAL HIGH (ref 70–115)
Glucose (POCT): 103 mg/dL (ref 70–115)
Glucose (POCT): 141 mg/dL — ABNORMAL HIGH (ref 70–115)

## 2015-01-04 LAB — DIL RUSSELL'S VIPER VENOM, BLOOD
DRWT, Control: 33.2 s
DRWT, Patient: 38.6 s
DRWT, Ratio (Pt/Con): 1.16 (ref <1.20)

## 2015-01-04 SURGERY — COLONOSCOPY
Anesthesia: Moderate Sedation - by non-anesthesia staff only

## 2015-01-04 MED ORDER — DIPHENHYDRAMINE HCL 50 MG/ML IJ SOLN
INTRAMUSCULAR | Status: DC | PRN
Start: 2015-01-04 — End: 2015-01-04
  Administered 2015-01-04: 50 mg via INTRAVENOUS

## 2015-01-04 MED ORDER — CLONIDINE HCL 0.3 MG OR TABS
0.3000 mg | ORAL_TABLET | Freq: Two times a day (BID) | ORAL | Status: DC
Start: 2015-01-04 — End: 2015-01-05
  Administered 2015-01-04 – 2015-01-05 (×2): 0.3 mg via ORAL
  Filled 2015-01-04 (×2): qty 3

## 2015-01-04 MED ORDER — INSULIN REGULAR HUMAN 100 UNIT/ML IJ SOLN
1.0000 [IU] | Freq: Four times a day (QID) | INTRAMUSCULAR | Status: DC
Start: 2015-01-04 — End: 2015-01-05
  Administered 2015-01-04: 3 [IU] via SUBCUTANEOUS
  Administered 2015-01-05: 1 [IU] via SUBCUTANEOUS
  Filled 2015-01-04: qty 3
  Filled 2015-01-04: qty 1

## 2015-01-04 MED ORDER — CARVEDILOL 12.5 MG OR TABS
12.5000 mg | ORAL_TABLET | Freq: Two times a day (BID) | ORAL | Status: DC
Start: 2015-01-04 — End: 2015-01-05
  Administered 2015-01-04 – 2015-01-05 (×2): 12.5 mg via ORAL
  Filled 2015-01-04 (×2): qty 1

## 2015-01-04 MED ORDER — DEXTROSE-KCL-NACL 5-0.15-0.9 % IV SOLN
INTRAVENOUS | Status: DC
Start: 2015-01-04 — End: 2015-01-05
  Administered 2015-01-04: 13:00:00 via INTRAVENOUS
  Filled 2015-01-04 (×4): qty 1000

## 2015-01-04 MED ORDER — MEPERIDINE HCL 100 MG/2ML IJ SOLN
INTRAMUSCULAR | Status: DC | PRN
Start: 2015-01-04 — End: 2015-01-04
  Administered 2015-01-04 (×3): 25 mg via INTRAVENOUS
  Administered 2015-01-04: 50 mg via INTRAVENOUS
  Administered 2015-01-04: 25 mg via INTRAVENOUS
  Administered 2015-01-04: 50 mg via INTRAVENOUS

## 2015-01-04 MED ORDER — CLONIDINE HCL 0.1 MG OR TABS
0.2000 mg | ORAL_TABLET | Freq: Once | ORAL | Status: AC
Start: 2015-01-04 — End: 2015-01-04
  Administered 2015-01-04: 0.2 mg via ORAL
  Filled 2015-01-04: qty 2

## 2015-01-04 MED ORDER — PEG 3350-KCL-NABCB-NACL-NASULF 236 GM OR SOLR
2000.0000 mL | Freq: Once | ORAL | Status: AC
Start: 2015-01-04 — End: 2015-01-04
  Administered 2015-01-04: 2000 mL via ORAL
  Filled 2015-01-04: qty 4000

## 2015-01-04 MED ORDER — SODIUM CHLORIDE 0.9 % IV SOLN
INTRAVENOUS | Status: DC | PRN
Start: 2015-01-04 — End: 2015-01-04
  Administered 2015-01-04: 200 mL via INTRAVENOUS

## 2015-01-04 MED ORDER — CLONIDINE HCL 0.1 MG OR TABS
0.1000 mg | ORAL_TABLET | Freq: Two times a day (BID) | ORAL | Status: DC
Start: 2015-01-04 — End: 2015-01-04
  Administered 2015-01-04: 0.1 mg via ORAL
  Filled 2015-01-04: qty 1

## 2015-01-04 MED ORDER — DEXTROSE-NACL 5-0.9 % IV SOLN (CUSTOM)
INTRAVENOUS | Status: DC
Start: 2015-01-04 — End: 2015-01-04
  Administered 2015-01-04: 08:00:00 via INTRAVENOUS

## 2015-01-04 MED ORDER — POTASSIUM CHLORIDE 20 MEQ/15ML (10%) OR SOLN
40.0000 meq | Freq: Once | ORAL | Status: DC
Start: 2015-01-04 — End: 2015-01-04

## 2015-01-04 MED ORDER — INSULIN GLARGINE 100 UNIT/ML SC SOLN
30.0000 [IU] | Freq: Every evening | SUBCUTANEOUS | Status: DC
Start: 2015-01-04 — End: 2015-01-05
  Administered 2015-01-04: 30 [IU] via SUBCUTANEOUS
  Filled 2015-01-04: qty 30

## 2015-01-04 MED ORDER — PROCHLORPERAZINE MALEATE 5 MG OR TABS
10.0000 mg | ORAL_TABLET | Freq: Four times a day (QID) | ORAL | Status: DC | PRN
Start: 2015-01-04 — End: 2015-01-05

## 2015-01-04 MED ORDER — HYDROMORPHONE HCL 1 MG/ML IJ SOLN
1.0000 mg | Freq: Once | INTRAMUSCULAR | Status: AC
Start: 2015-01-04 — End: 2015-01-04
  Administered 2015-01-04: 1 mg via INTRAVENOUS
  Filled 2015-01-04: qty 1

## 2015-01-04 MED ORDER — MIDAZOLAM HCL 5 MG/5ML IJ SOLN
INTRAMUSCULAR | Status: DC | PRN
Start: 2015-01-04 — End: 2015-01-04
  Administered 2015-01-04 (×4): 2 mg via INTRAVENOUS
  Administered 2015-01-04 (×2): 1 mg via INTRAVENOUS

## 2015-01-04 MED ORDER — CARVEDILOL 6.25 MG OR TABS
6.2500 mg | ORAL_TABLET | Freq: Two times a day (BID) | ORAL | Status: DC
Start: 2015-01-04 — End: 2015-01-04
  Administered 2015-01-04: 6.25 mg via ORAL
  Filled 2015-01-04: qty 1

## 2015-01-04 MED ORDER — SIMETHICONE IN STERILE WATER IRRIGATION 2.01 MG/ML (OPTIME)
Status: DC | PRN
Start: 2015-01-04 — End: 2015-01-04
  Administered 2015-01-04: 100 mL

## 2015-01-04 SURGICAL SUPPLY — 1 items: BIOPSY FORCEP RADIAL JAW 4 2.8MM X 240CM, LARGE W/NEEDLE (Misc Medical Supply) ×2 IMPLANT

## 2015-01-04 NOTE — Plan of Care (Signed)
Problem: Alteration in Blood Glucose  Goal: Glucose level within specified parameters  Outcome: Met  No insulin coverage need before bedtime. Will continue to monitor blood sugar q6hrs because patient is in NPO.     Problem: Falls, Risk of  Goal: Keep patient free from falls utilizing universal fall precautions  Outcome: Met  Patient is wearing non skid socks. Patient is AOx4. Patient refused bed alarm. Risk and benefits explained.  Patient verbalized understanding. Instructed to call when in need of anything or assistance. Call light with in reach. Bedside commode provided.     Problem: Discharge Planning  Goal: Participation in care planning  Outcome: Met  No discharge order yet at this time.     Problem: Tissue Perfusion, Cardiopulmonary - Altered  Goal: Hemodynamic stability  Outcome: Not Met  Patient has elevated SBP and notified MD. Will continue to monitor.     Problem: Pain - Acute  Goal: Communication of presence of pain  Outcome: Not Met  Patient was given pain medication as well as the rescue dose. Will continue to assess and reassess pain

## 2015-01-04 NOTE — Interdisciplinary (Signed)
Physical Therapy     Therapy Contact   Contact Time: 1042  Therapy not provided at this time as: Nursing has deferred therapy;Other (comment) (RN explains that pt. has been requesting to use commode frequently and is experiencing abdominal pain, which may limit participation in PT)

## 2015-01-04 NOTE — Progress Notes (Signed)
COLONOSCOPY 01/04/15    FINDINGS  Colonic mucosa of 10 cm segment of sigmoid colon appears erythematous, with patchy petechial erythema, biopsies were obtained. Rest of the colonic mucosa appears normal. No diverticuli were identified. Terminal ileum mucosa appears normal. Biopsies obtained from terminal ileum, right and left colon for histology.    ENDOSCOPIC DIAGNOSIS  Petechial erythema at the sigmoid colon, biopsied.   Rest of the colon appears normal.    RECOMMENDATIONS  Follow biopsies results.  Continue antibiotics.  Follow up stool studies including clostridium difficile testing.  Rest of recommendations per inpatient GI team.

## 2015-01-04 NOTE — Plan of Care (Signed)
Problem: Alteration in Blood Glucose  Goal: Glucose level within specified parameters  Outcome: Met  No signs and symptoms of hyperglycemia, remain NPO for procedure.     Problem: Falls, Risk of  Goal: Keep patient free from falls utilizing universal fall precautions  Outcome: Met  Uses bedside commode with no problem, aware to call when needed help. Call button remain within reach, bed on lowest locked position.    Problem: Tissue Perfusion, Cardiopulmonary - Altered  Goal: Hemodynamic stability  Outcome: Not Met  Patient continue to have high BP,  MD is aware, was given clonidine times one, received all BP meds. Will continue to monitor.    Problem: Pain - Acute  Goal: Communication of presence of pain  Outcome: Met  Was medicated with dilaudid iv for abdominal cramps with some relief. See doc flow.

## 2015-01-04 NOTE — Progress Notes (Cosign Needed)
Daily Progress Note for 01/04/2015   Patient: Katherine Church, 05-Apr-1966, [id number]   Location: 634/634[facility]  Length of stay:   1 day - Admitted on: 01/03/2015   ?  ID    Charice Zuno is a 49 year old female with history of HTN, DM, and hospitalizations for colitis who was BIBA for severe abd pain, nausea, and vomiting, and admitted for pancolitis.    Subjective   Interval Events  Hypertensive episodes over night.      Patient still in severe pain. Feels very nauseas. Dilaudid enables her to tolerate pain but does not take it away. Patient reports 10 watery stools. Has been drinking GoLYTELY since last night.     Patient denies fever, SOB, CP, palpitations, constipation, and dysuria.         ROS  GENERAL: no fever/chills, weight change, N/V/D, fatigue, night sweats, general weakness, diaphoresis.  SKIN: No color changes, rash, moles, sores, pruritis, hemorrhage, hair loss, dryness, moisture  HEENT: no HA, vertigo, head trauma, vision changes, hearing changes, discharge, itchiness, sore throat  BREASTS: No lumps, discharge, pain, galactorrhea, enlargement, or lesions  PULM: No cough, sputum, hemoptysis, SOB, wheezing, stridor, infections, asthma, COPD, apnea, pleuritis  CVD: no HTN, CAD, CHF, PVD, angina, PND, palpitations, edema, orthopnea, claudication  GI: No n, v, d, abdominal pain, emesis, hematemesis, constipation, GERD.  GU: No frequency, urgency, polyuria, nocturia, flank pain, hematuria, discharge, STDs, UTIs  OB/Gyn: No dysmenorrhea, postmenopausal bleeding,   NEURO: No vertigo, HA, syncope, seizures, altered sensation, weakness, tremor, speech disorder  HEME/ONC: No pallor, bleeding, bruising, swollen lymph nodes.  ENDO: No lethargy, hot/cold intolerance, nervousness, polyuria, polydypsia  PSYCH: No nervousness, depression, anxiety, problems with attention, hallucinations, delusions.   Objective   Temperature:  [97.5 F (36.4 C)-99.9 F (37.7 C)] 98.9 F (37.2 C) (05/26 1133)  Blood pressure (BP):  (134-202)/(84-124) 149/105 mmHg (05/26 1133)  Heart Rate:  [71-80] 78 (05/26 1133)  Respirations:  [18-24] 18 (05/26 1133)  Pain Score:  [-] 10 (05/26 1223)  O2 Device:  [-] None (Room air) (05/25 1756)  SpO2:  [97 %-98 %] 97 % (05/26 1133)    Wt Readings from Last 1 Encounters:   01/03/15 78.472 kg (173 lb)       General: NAD, well-developed, well-nourished.   HEENT: NC/AT, PERRLA, EOMI, conjunctivae clear, MMM, OP clear w/o erythema  Neck: Supple, no LAD  CV: RRR, No Murmurs/Rubs/Gallops  Lungs: CTAB, no crackles or wheezes. Speaks in full sentences.   Abdomen: TTP over left and right lower quadrants. BS+, Soft, ND.  Extremities: DP/PT 2+, warm, well-perfused, no c/c/e  Skin: No obvious rashes or lesions  Neuro: AOx4, attentive, cooperative, CN II-XII grossly intact, no focal deficits, UE/LE strength 5/5, normal gait, patellar reflex 2+  Psych: Normal mood and affect, no SI/HI  ess    Data Review     LABS  Reviewed and pertinent for:  Recent Labs      01/03/15   0333  01/04/15   0607   WBC  14.7*  7.6   RBC  5.28*  4.29   HGB  15.1  12.1   HCT  42.6  35.8   MCV  80.7  83.4   MCHC  35.4  33.8   RDW  13.2  13.5   PLT  255  184   MPV  9.6  9.4   SEG  76*   --    LYMPHS  19   --  MONOS  4*   --    EOS  1   --      Recent Labs      01/03/15   0333  01/03/15   0617  01/04/15   0607   NA  138  137  144   K  4.0  3.8  3.3*   CL  95*  97*  103   BICARB  20*  21*  24   BUN  13  11  4*   CREAT  0.51  0.47*  0.56   GLU  315*  297*  125*   CA  10.0  9.3  8.6   ALB  4.6   --    --    MG   --    --   2.1   PHOS   --    --   5.2*     Recent Labs      01/03/15   0333   TBILI  0.62   TP  8.7*   AST  19   ALK  113   ALT  37*       Recent Labs      01/03/15   0333   CPK  22   CKMBH  <1.0   TROPONIN  <0.01       MICRO:      IMAGING:      Consults  IP CONSULT TO GASTROENTEROLOGY      Assessment and Plan   Malachi Suderman is a 49 year old female w/ a pmhx of HTN, DM, and colitis.       #ABD pain/nausea/vomit: ABD spasms, diarrhea,  vomiting. Extensive history starting 2012. Failure of Abx after one month to relieve symptoms. DDx includes, ischemic vs. Infectious vs. Autoimmune (crohn's, [place], vasculitis). Inflammatory disease (Crohn's or [place]) seems most likely given pancolitis and elevated CRP. Ischemic less likely as it usually involves watershed areas.   - repeat a rheum workup: ANA, ANCA panel, ESR, C3, C4, Anti-Cardiolipin (hx of weak positive)  -Anti-emetics w/ Phenergan, Zofran PRN  - Pain control w/ Hydromorphone PRN  - Fluid resuscitation, bolus in the ED  - Continue Unasyn  - Continue home Bentyl    #DM2: Symptoms of lightheadedness, nausea, etc. Labs show high glucose and HbA1c. DM uncontrolled. UA shows diabetic nephropathy.  -24 glucose monitor. AM, pre lunch, pre dinner, Bedtime  -Humalog 1-10 units with meals and nightly  -Regular insulin every 6 hours     #HTN: Patient has had HTN since 48 years old. Maxed out on all medications (clonidine, HCTZ, lisonopril, and carvedilol). Has had previous workup for secondary HTN causes, which all came back within normal limits.     Terrance Mass, MS3    Pt seen and discussed with Dr. Ronalee Red, Ike Bene., MD, who agrees with the above plan.      Meds   Scheduled Meds   ampicillin-sulbactam (UNASYN) IVPB  3,000 mg Q6H    carvedilol  6.25 mg BID WC    cloNIDine  0.3 mg BID    dicyclomine  10 mg TID AC    hydrochlorothiazide  25 mg Daily    insulin glargine  30 Units HS    insulin regular  1-10 Units Q6H    lansoprazole  30 mg Daily    lisinopril  40 mg BID    polyethylene glycol  17 g Daily    ramelteon  8 mg HS    senna  2  tablet HS    sodium chloride (PF)  3 mL Q8H       PRN Meds   acetaminophen  650 mg Q4H PRN    Or    acetaminophen  650 mg Q4H PRN    Or    acetaminophen  650 mg Q4H PRN    dextrose  12.5 g PRN    glucagon  1 mg Once PRN    glucose  4 tablet PRN    glucose  1 Tube PRN    HYDROmorphone  0.5 mg Q4H PRN    HYDROmorphone  0.5 mg Q4H PRN    HYDROmorphone  1 mg Q4H  PRN    nalOXone  0.1 mg Q2 Min PRN    ondansetron  4 mg Q6H PRN    prochlorperazine  10 mg Q6H PRN    sodium chloride (PF)  3 mL PRN    sodium chloride   Continuous PRN       IV Meds   dextrose-sodium chloride 5%-0.9%-potassium chloride 20 mEq 100 mL/hr at 01/04/15 1317    sodium chloride         Allergies:   Allergies   Allergen Reactions    Ciprofloxacin Rash    Flagyl [Metronidazole Hcl] Rash    Sorbitol Unspecified    Tetanus Toxoids Swelling

## 2015-01-04 NOTE — Interdisciplinary (Signed)
Paged Coralee Northemple MD "Pt Katherine Church, [name]. Pt back from GI, did you wish to reorder diet? Thx! "

## 2015-01-04 NOTE — Interdisciplinary (Signed)
01/04/15 2124   Referral Information   Referral Type Discharge Planning  (Discharge Planning; Ms. Hurtubise is a single 49 year-old woman admitted for pancolitis vs IBD, she has a history of DM, chronic pain and hypoglycemia)   Social Assessment   Primary Decision Maker Self   Advance Directive None   Social Determinates of Health   Prior Living Situation Lives alone  (lives locally with partner Gay Filler [phone], who is in [place] and will return in July, Until then she is living alone )   Support System Girlfriend/Boyfriend  (Family lives in [place] )   Income (is supported by [employer] )   Payee Other (Comment)   [facility] [address] )   Transportation Public Transit   Medication Compliance Adheres to medication   Mental Health Assessment   Mental Status - Emotional Content Anxiety  (currently sees a therapist and psychiatrist to treat anxiety)   Mental Status - Psychological No issues   Behavioral Assessment Worrying  (worried about ability to care for herself at home, is having frequent diarrhea from medication while in hospital and passed out at home)   Physical Assessment No issues   Mental Status - Orientation alert and oriented x 4    Mental Health History   Past Mental Health Issues anxiety   Current Mental Health Issues states she is doing "Ok"   Adjustment to Illness   Patient's Adjustment Anxiety   Family's Adjustment Family not available   Over the past two weeks, how often have you been bothered by any of the following problems?   Little interest or pleasure in doing things 2   Feeling down, depressed, or hopeless 1   Depression Scale Subtotal - If Greater than or Equal to 3, complete the following two questions 3   Thoughts that you would be better off dead, or of hurting yourself in some way 0   Scores 1 or greater: Notify the patient's Primary Team for a Psych Consult, and notify Primary RN regarding Suicide Risk Not Applicable   Substance Abuse  History (CAGE-AID)   Have you ever felt you ought to cut down on your drinking or drug use? 0   Have people annoyed you by criticizing your drinking or drug use? 0   Have you ever felt bad or guilty about your drinking or drug use? 0   Have you ever had a drink or used drugs first thing in the morning to steady your nerves or to get rid of a hangover? 0   Number of "Yes" Responses 0   Substance Abuse History no substance abuse history   Cognitive and Physical Functional Deficits   Prior Level of Function independent   Current Level of Function pending PT eval   Therapy Consult(s) Needed Physical Therapy   Discharge Plans/Interventions   Anticipated Discharge Destination Home   Discharge Resources Given none identified at this time   Discharge Information for Patient - THIS GROUP FILES TO THE PATIENT'S AVS   Social Worker Name and Phone Number: Wilhemina Cash LCSW     Ms. Passey is a pleasant lady who appears to be coping appropriately with her hospitalization. She is hopeful to regain strength to return home alone and is appropriately worried about feeling unsafe at home and being alone. She did say that her partner Thayer Ohm could return earlier as needed.    No further needs identified at this time. Social work will continue to remain  available as needed.  Wilhemina CashMelanie Tyrome Donatelli LCSW pager [phone]

## 2015-01-04 NOTE — Plan of Care (Signed)
Patient was sent to GI for  Colonoscopy.

## 2015-01-04 NOTE — H&P (Signed)
History and Physical    Indication for procedure:  Patient is a 49 year old female with a past medical history significant for multiple hospital stays for ischemic colitis, HTN, DM type II on insulin, presenting with abdominal pain, nausea, vomiting, diarrhea, found with pancolitis, here for colonoscopy    Pain Score: 8  Pain Location: Abdomen  Pain Description: Constant    Past Medical History   Diagnosis Date    Back pain     High blood pressure     Diabetes mellitus     Hyperlipidemia LDL goal < 100     Colitis, ulcerative      Past Surgical History   Procedure Laterality Date    Pb anesth,tubal ligation/transection  1994    Colonoscopy  04/2011     Repeat surveillance colo in 2019     Allergies   Allergen Reactions    Ciprofloxacin Rash    Flagyl [Metronidazole Hcl] Rash    Sorbitol Unspecified    Steroids [Corticosteroids] Other     Blood pressure went very high    Tetanus Toxoids Swelling     Prior to Admission Medications   Prescriptions Last Dose Informant Patient Reported? Taking?   CARVEDILOL PO Taking  Yes Yes   Gauze Pads & Dressings (CURITY IODOFORM PACKING STRIP) MISC Not Taking  No No   Sig: Apply 1 bottle topically daily.   Gauze Pads & Dressings (GAUZE DRESSING) 4"X4" PADS Not Taking  No No   Sig: Apply 1 Application topically daily.   HYDROCHLOROTHIAZIDE PO Taking  Yes Yes   HYDROcodone-acetaminophen (NORCO) 5-325 MG tablet   No No   Sig: Take 1 tablet by mouth every 6 hours as needed for Moderate Pain (Pain Score 4-6).   HYDROmorphone (DILAUDID) 2 MG tablet   No No   Sig: Take 1.5 tablets by mouth every 4 hours as needed for Moderate Pain (Pain Score 4-6) or Severe Pain (Pain Score 7-10).   Insulin Syringe-Needle U-100 31G X 5/16" 0.3 ML MISC Taking  No Yes   Sig: Use one syringe with each insulin administration.   amoxicillin-clavulanate (AUGMENTIN) 875-125 MG per tablet Not Taking  No No   Sig: Take 1 tablet by mouth every 12 hours.   atorvastatin (LIPITOR) 20 MG tablet Not Taking  No  No   Sig: Take 1 tablet by mouth daily.   blood glucose meter Not Taking  No No   Sig: Use as directed.   diazepam (VALIUM) 5 MG tablet Taking  Yes Yes   Sig: Take 5 mg by mouth every 12 hours as needed.   dicyclomine (BENTYL) 10 MG capsule Taking  No Yes   Sig: Take 1 capsule by mouth 3 times daily (before meals).   docusate sodium (COLACE) 250 MG capsule Taking  No Yes   Sig: Take 1 capsule by mouth 2 times daily.   docusate sodium (COLACE) 250 MG capsule   No No   Sig: Take 1 capsule by mouth 2 times daily.   glucose blood test strip   No No   Sig: 1 strip by Other route 4 times daily (before meals and nightly).   insulin glargine (LANTUS) 100 UNIT/ML injection Taking  No Yes   Sig: Inject 36 Units under the skin nightly.   insulin lispro (HUMALOG) 100 UNIT/ML injection Taking  No Yes   Sig: Inject 12 Units under the skin 3 times daily (before meals).   lancets   No No   Sig: 1  Lancet by Other route 4 times daily (before meals and nightly).   lansoprazole (PREVACID SOLUTAB) 30 MG disintegrating tablet Taking  No Yes   Sig: Take 1 tablet by mouth daily.   lisinopril (PRINIVIL, ZESTRIL) 40 MG tablet Taking  No Yes   Sig: Take 1 tablet by mouth 2 times daily.   lisinopril (PRINIVIL, ZESTRIL) 40 MG tablet   No No   Sig: Take 1 tablet by mouth 2 times daily.   omeprazole (PRILOSEC) 20 MG capsule Taking  Yes Yes   Sig: Take 20 mg by mouth daily.   ondansetron (ZOFRAN ODT) 4 MG disintegrating tablet Not Taking  No No   Sig: Take 1 tablet by mouth every 6 hours as needed for Nausea.   ondansetron (ZOFRAN ODT) 4 MG disintegrating tablet Not Taking  No No   Sig: Take 1 tablet by mouth every 8 hours as needed for Nausea.   oxyCODONE-acetaminophen (PERCOCET) 5-325 MG per tablet Not Taking  No No   Sig: Take 1 tablet by mouth every 6 hours as needed for Severe Pain (Pain Score 7-10).   polyethylene glycol (MIRALAX) packet   No No   Sig: Take 1 packet by mouth daily.   senna (SENOKOT) 8.6 MG tablet Not Taking  No No   Sig:  Take 1 tablet by mouth daily.   senna (SENOKOT) 8.6 MG tablet Not Taking  No No   Sig: Take 1 tablet by mouth daily.      Facility-Administered Medications: None       BP 149/106 mmHg   Pulse 82   Temp(Src) 98.9 F (37.2 C)   Resp 18   Ht 5\' 4"  (1.626 m)   Wt 78.472 kg (173 lb)   BMI 29.68 kg/m2   SpO2 96%   LMP 06/02/2011  General:   Normal  Lungs:   Normal  CV:    Normal  Abdomen:   Abdominal tenderness on palpation, diffuse    ASA Score:  2   Airway (Mallimpati) Score:  Class II - Soft palate, uvula, and fauces are visible.    Assessment and Plan  Proceed to planned procedure.    The patient has consented to the procedure, which will be done with sedation.  I have assessed the patient's status immediately prior to this procedure.  I have discussed pain management needs and options for the patient with the patient or caregiver.      The patient agrees to be full code for the duration of the procedure.    Sedation options, risks, and plans have been discussed with the patient or caregiver.  Questions were answered.  The patient or caregiver agrees to proceed as planned.    Katherine Church

## 2015-01-04 NOTE — Progress Notes (Signed)
Medicine Progress Note:    ID: Ms. Wiegel is a 49 year old woman with pmh of essential HTN, DM2, anxiety, and colitis of unknown etiology who presents to the ED with generalized fatigue, nausea, vomiting and abdominal pain found to have pancolitis, leukocytosis and mildly elevated inflammatory markers.    Date of Admission: 01/03/2015  Current Hospital Stay:  LOS: 1 day      INTERVAL EVENTS:   - Hypertensive to 200/110  - Golytely prep    SUBJECTIVE: she continues to have abdominal pain, dilaudid not resolving it, remains nauseated, multiple bms (~10x) overnight, non-bloody. Denies fevers or chills.    OBJECTIVE:  Temperature:  [97.5 F (36.4 C)-99.9 F (37.7 C)] 98.3 F (36.8 C) (05/26 0455)  Blood pressure (BP): (134-202)/(84-119) 137/84 mmHg (05/26 0640)  Heart Rate:  [71-80] 77 (05/26 0640)  Respirations:  [18-24] 20 (05/26 0455)  Pain Score:  [-] 8 (05/26 0505)  O2 Device:  [-] None (Room air) (05/25 1756)  SpO2:  [96 %-100 %] 97 % (05/25 2245)    Exam:   Gen: Alert, cooperative, visibly in distress  Head: pink conjunctiva, non-icteric sclera, PEERL, EOMI, MMM, no OP lesions  Neck: No masses, LAD or thyromegaly  Heart: RRR, no m,r,g, no JVP or carotid bruits  Lungs: CTAB no crackles, rales, or wheezes, no increased work of breathing  Abd: hypoactive bowel sounds, -peritoneal signs, very TTP RLQ to mild palpation, + Guarding in RLQ, otherwise minimal TTP  Ext: warm and well perfused, peripheral pulses intact, no LE edema  Neuro: A&Ox3, CN II-IX intact, strength and sensation grossly intact  Skin: No scars, rash, bruising, jaundice or dry skin.  MSK: No muscle wasting or musculoskeletal pain.    Labs:  Laboratory data reviewed and significant for:     BMP:  144/3.3/103/24/4/0.56/125 (05/26 5427)   WBC/HGB/HCT/PLT:  7.6/12.1/35.8/184 (05/26 0607)    Phos 5.2  Mg 2.1    C3 157, nl  C4 30, nl    A1c: 9.1%    Micro: none    Imaging:   01/03/15 CT Abd/Pelvis:  SMALL & LARGE BOWEL: Nearly all of the colon demonstrates  wall thickening withmural stratification and with minimal adjacent stranding. Normal appendix.  BLADDER: Unremarkable  PELVIC ORGANS: Unremarkable  BONES: Degenerative changes noted along the spine. Diffuse osteopenia noted.  SOFT TISSUES: Unremarkable    Historic:  Renal US 2012: no e/o renal artery stenosis    MRI/MRA Enterography 04/2011:  1. Patent aorta and mesenteric vessels. No evidence of occlusion, narrowing, or aneurysm or thickening.  2. No acute findings.    Assessment & Plan:  Ms. Stambaugh is a 49 year old woman with pmh of essential HTN, DM2, anxiety, and colitis of unknown etiology who presents to the ED with generalized fatigue, nausea, vomiting and abdominal pain found to have pancolitis, leukocytosis and mildly elevated inflammatory markers.    # Nausea, Vomiting, Abdominal Pain, Pancolitis: uncertain etiology, last followed by GI here in 2013, at that time, MRI/MRA was w/o large vessel disease but there was some concern for small vessel vs vasculitis. Saw Rheum and w/ minimally positive ANA (1:40), negative cardiolipin. CT today w/ e/o pancolitis, also CRP elevation. Lipase negative, thus pancreatitis unlikely. Admit to med/surg primarily due to PO intolerance.  Angelita Ingles placement for GoLytely admin per GI rec's  - Colonoscopy today  - Anti-emetics w/ Phenergan, Zofran PRN  - Pain control w/ Hydromorphone PRN  - Fluid resuscitation, bolus in the ED  - Con't  Unasyn  - Will repeat a rheum workup: ANA, ANCA panel, ESR, C3, C4, Anti-Cardiolipin (hx of weak positive)  - Con't home Bentyl    # Generalized Fatigue/Weakness: possibly related to systemic illness, query vasculitis vs IBD.  - see above vasculitis w/u    # Hypokalemia: likely 2/2 insulin admin.  - will give 20 mEq KCl w/ D5 NS at 100cc/hr  - daily bmp    # Respiratory Alkalosis w/ Anion Gap: minimally elevated lactate, negative beta-hydroxybutyrate, no reported injections, not uremic. Respiratory alkalosis likely due to hyperventilation from  pain, uncertain etiology of anion gap, improving w/ fluids. Improving.  - control bp and abx as above and below    # Essential HTN: has had workup for secondary causes including renin/aldo, cortisol, urine metanephrines and renal US w/ dopplers, all wnl. Likely essential HTN. On Lisinopril, Coreg, Clonidine, HCTZ and Hydral (PRN) at home.  - Continue Clonidine, Coreg, Lisinopril, HCTZ  - PRN Hydral for SBP >160    # Insulin-Dependent DM2: on glargine 36 units qhs, and aspart 12 units tid at home. A1c 9.1%.  - Glargine 30 units  - Medium SSI    Nutrition: NPO for C-scope  Fluid: D5 NS +70mEq KCl @ 100cc/hr while NPO and vomiting  Electrolytes: replacing as needed  DVT PPx: lovenox  Constipation PPx: senna, miralax  GI PPx: lanzoprazole  Dispo: med/surg for workup, likely >2 nights  Code Status: full code    Stefanie Libel) Georgie Chard, MD  Internal Medicine PGY-1  Pager: [phone]

## 2015-01-04 NOTE — Interdisciplinary (Signed)
Physical Therapy     Therapy Contact   Contact Time: 1333  Therapy not provided at this time as: Patient refused;Other (comment) (Pt. politely requested to participate in PT tomorrow, as she is in 6/10 pain at rest and experiences increased abdominal pain with movement. RN aware. )

## 2015-01-05 ENCOUNTER — Encounter (HOSPITAL_BASED_OUTPATIENT_CLINIC_OR_DEPARTMENT_OTHER): Payer: Self-pay | Admitting: Gastroenterology

## 2015-01-05 LAB — ECG 12-LEAD
ATRIAL RATE: 75 {beats}/min
QRS INTERVAL/DURATION: 82 ms
QT: 400 ms
QTC INTERVAL: 458 ms
R AXIS: 66 degrees
T AXIS: 56 degrees
VENTRICULAR RATE: 79 {beats}/min

## 2015-01-05 LAB — CBC WITH DIFF, BLOOD
ANC-Manual Mode: 7 10*3/uL (ref 1.6–7.0)
Abs Eosinophils: 0.1 10*3/uL (ref 0.1–0.7)
Abs Lymphs: 1.3 10*3/uL (ref 0.8–3.1)
Abs Monos: 0.3 10*3/uL (ref 0.2–0.8)
Eosinophils: 1 % (ref 1–4)
Hct: 36.6 % (ref 34.0–45.0)
Hgb: 12.6 gm/dL (ref 11.2–15.7)
Lymphocytes: 15 % — ABNORMAL LOW (ref 19–53)
MCH: 28.4 pg (ref 26.0–32.0)
MCHC: 34.4 % (ref 32.0–36.0)
MCV: 82.4 um3 (ref 79.0–95.0)
MPV: 9.5 fL (ref 9.4–12.4)
Monocytes: 3 % — ABNORMAL LOW (ref 5–12)
Number of Cells Counted: 121
Plt Count: 197 10*3/uL (ref 140–370)
Plt Est: NORMAL
RBC: 4.44 10*6/uL (ref 3.90–5.20)
RDW: 13.2 % (ref 12.0–14.0)
Segs: 81 % — ABNORMAL HIGH (ref 34–71)
WBC: 8.6 10*3/uL (ref 4.0–10.0)

## 2015-01-05 LAB — STANDARD O&P
Ova & Parasite Result: NEGATIVE
Trichrome Stain Result: NEGATIVE

## 2015-01-05 LAB — GASTROINTESTINAL PATHOGEN NUCLEIC ACID DETECTION TEST: Gastrointestinal Pathogen Nucleic Acid Detection Test: NOT DETECTED

## 2015-01-05 LAB — GLUCOSE (POCT)
Glucose (POCT): 123 mg/dL — ABNORMAL HIGH (ref 70–115)
Glucose (POCT): 138 mg/dL — ABNORMAL HIGH (ref 70–115)
Glucose (POCT): 157 mg/dL — ABNORMAL HIGH (ref 70–115)
Glucose (POCT): 278 mg/dL — ABNORMAL HIGH (ref 70–115)

## 2015-01-05 LAB — BASIC METABOLIC PANEL, BLOOD
Anion Gap: 16 mmol/L — ABNORMAL HIGH (ref 7–15)
BUN: <2 mg/dL — ABNORMAL LOW (ref 6–20)
Bicarbonate: 23 mmol/L (ref 22–29)
Calcium: 8.9 mg/dL (ref 8.5–10.6)
Chloride: 99 mmol/L (ref 98–107)
Creatinine: 0.49 mg/dL — ABNORMAL LOW (ref 0.51–0.95)
GFR: >60 mL/min
Glucose: 237 mg/dL — ABNORMAL HIGH (ref 70–115)
Potassium: 3.3 mmol/L — ABNORMAL LOW (ref 3.5–5.1)
Sodium: 138 mmol/L (ref 136–145)

## 2015-01-05 LAB — ANTI-NUCLEAR-AB-TITER, BLOOD: Anti-Nuclear Ab Titer: 1:40 {titer}

## 2015-01-05 LAB — MRSA SURVEILLANCE CULTURE

## 2015-01-05 LAB — ANA (ANTI-NUCLEAR AB), BLOOD: ANA (Anti-Nuclear Ab): POSITIVE

## 2015-01-05 LAB — ANTI-NEUTRO CYTOPLASMA AB, BLOOD: Anti-Neutro Cytoplasm Ab: NEGATIVE

## 2015-01-05 LAB — C-REACTIVE PROTEIN, BLOOD: CRP: 3 mg/dL — ABNORMAL HIGH (ref <0.5)

## 2015-01-05 MED ORDER — INSULIN LISPRO (HUMAN) 100 UNIT/ML SC SOLN (CUSTOM)
8.0000 [IU] | Freq: Three times a day (TID) | INTRAMUSCULAR | Status: DC
Start: 2015-01-05 — End: 2015-01-05
  Administered 2015-01-05: 13:00:00 8 [IU] via SUBCUTANEOUS
  Filled 2015-01-05: qty 8

## 2015-01-05 MED ORDER — CARVEDILOL 12.5 MG OR TABS
12.5000 mg | ORAL_TABLET | Freq: Once | ORAL | Status: AC
Start: 2015-01-05 — End: 2015-01-05
  Administered 2015-01-05: 12.5 mg via ORAL
  Filled 2015-01-05: qty 1

## 2015-01-05 MED ORDER — CARVEDILOL 25 MG OR TABS
25.0000 mg | ORAL_TABLET | Freq: Two times a day (BID) | ORAL | Status: DC
Start: 2015-01-05 — End: 2015-01-05

## 2015-01-05 MED ORDER — HYDRALAZINE HCL 25 MG OR TABS
25.0000 mg | ORAL_TABLET | Freq: Three times a day (TID) | ORAL | Status: DC | PRN
Start: 2015-01-05 — End: 2015-01-05

## 2015-01-05 MED ORDER — LATANOPROST 0.005 % OP SOLN
1.0000 [drp] | Freq: Every evening | OPHTHALMIC | Status: DC
Start: 2015-01-05 — End: 2015-01-05
  Filled 2015-01-05: qty 2.5

## 2015-01-05 MED ORDER — CLONIDINE HCL 0.3 MG OR TABS
0.3000 mg | ORAL_TABLET | Freq: Three times a day (TID) | ORAL | Status: DC
Start: 2015-01-05 — End: 2015-01-05
  Administered 2015-01-05: 0.3 mg via ORAL
  Filled 2015-01-05 (×2): qty 1

## 2015-01-05 MED ORDER — LISINOPRIL 40 MG OR TABS
40.0000 mg | ORAL_TABLET | Freq: Two times a day (BID) | ORAL | Status: AC
Start: 2015-01-05 — End: ?

## 2015-01-05 MED ORDER — ONDANSETRON HCL 4 MG OR TABS
4.0000 mg | ORAL_TABLET | Freq: Four times a day (QID) | ORAL | Status: DC | PRN
Start: 2015-01-05 — End: 2015-01-05

## 2015-01-05 MED ORDER — HYDRALAZINE HCL 50 MG OR TABS
25.0000 mg | ORAL_TABLET | Freq: Three times a day (TID) | ORAL | Status: DC | PRN
Start: 2015-01-05 — End: 2015-01-05

## 2015-01-05 MED ORDER — DICYCLOMINE HCL 10 MG OR CAPS
20.0000 mg | ORAL_CAPSULE | Freq: Three times a day (TID) | ORAL | Status: DC
Start: 2015-01-05 — End: 2015-01-05

## 2015-01-05 MED ORDER — DICYCLOMINE HCL 10 MG OR CAPS
20.0000 mg | ORAL_CAPSULE | Freq: Three times a day (TID) | ORAL | Status: DC
Start: 2015-01-05 — End: 2015-01-05
  Administered 2015-01-05: 20 mg via ORAL
  Filled 2015-01-05: qty 2

## 2015-01-05 MED ORDER — DICYCLOMINE HCL 10 MG OR CAPS
20.0000 mg | ORAL_CAPSULE | Freq: Three times a day (TID) | ORAL | Status: AC
Start: 2015-01-05 — End: ?

## 2015-01-05 MED ORDER — HYDRALAZINE HCL 25 MG OR TABS
25.0000 mg | ORAL_TABLET | Freq: Three times a day (TID) | ORAL | Status: AC
Start: 2015-01-05 — End: ?

## 2015-01-05 MED ORDER — HYDROMORPHONE HCL 4 MG OR TABS
4.0000 mg | ORAL_TABLET | ORAL | Status: AC | PRN
Start: 2015-01-05 — End: ?

## 2015-01-05 MED ORDER — HYDROCHLOROTHIAZIDE 25 MG OR TABS
25.0000 mg | ORAL_TABLET | Freq: Every day | ORAL | Status: AC
Start: 2015-01-05 — End: ?

## 2015-01-05 MED ORDER — HYDROMORPHONE HCL 4 MG OR TABS
4.0000 mg | ORAL_TABLET | Freq: Once | ORAL | Status: AC
Start: 2015-01-05 — End: 2015-01-05
  Administered 2015-01-05: 4 mg via ORAL
  Filled 2015-01-05: qty 1

## 2015-01-05 MED ORDER — CLONIDINE HCL 0.3 MG OR TABS
0.3000 mg | ORAL_TABLET | Freq: Three times a day (TID) | ORAL | Status: AC
Start: 2015-01-05 — End: ?

## 2015-01-05 MED ORDER — POTASSIUM CHLORIDE CRYS CR 10 MEQ OR TBCR
40.0000 meq | EXTENDED_RELEASE_TABLET | Freq: Once | ORAL | Status: AC
Start: 2015-01-05 — End: 2015-01-05
  Administered 2015-01-05: 40 meq via ORAL
  Filled 2015-01-05: qty 4

## 2015-01-05 MED ORDER — DICYCLOMINE HCL 10 MG OR CAPS
20.0000 mg | ORAL_CAPSULE | Freq: Once | ORAL | Status: AC
Start: 2015-01-05 — End: 2015-01-05
  Administered 2015-01-05: 20 mg via ORAL
  Filled 2015-01-05: qty 2

## 2015-01-05 MED ORDER — PROCHLORPERAZINE MALEATE 10 MG OR TABS
10.0000 mg | ORAL_TABLET | Freq: Four times a day (QID) | ORAL | Status: AC | PRN
Start: 2015-01-05 — End: ?

## 2015-01-05 MED ORDER — HYDROMORPHONE HCL 4 MG OR TABS
4.0000 mg | ORAL_TABLET | ORAL | Status: DC | PRN
Start: 2015-01-05 — End: 2015-01-05
  Administered 2015-01-05: 4 mg via ORAL
  Filled 2015-01-05: qty 1

## 2015-01-05 MED ORDER — INSULIN LISPRO (HUMAN) 100 UNIT/ML SC SOLN (CUSTOM)
1.0000 [IU] | Freq: Four times a day (QID) | INTRAMUSCULAR | Status: DC
Start: 2015-01-05 — End: 2015-01-05
  Administered 2015-01-05: 13:00:00 6 [IU] via SUBCUTANEOUS
  Filled 2015-01-05: qty 6

## 2015-01-05 NOTE — Interdisciplinary (Signed)
Dr Clydia LlanoAndrew Nguyen on call MD at night was here in the Nsg station thus made aware of pt's elevated BP with latest one of 167/94 & MD said no new orders for now as they are aware of pt's bouts of high BP the whole day yesterday.

## 2015-01-05 NOTE — Progress Notes (Addendum)
Medicine Progress Note:    ID: [name] is a 49 year old woman with pmh of essential HTN, DM2, anxiety, and colitis of unknown etiology who presents to the ED with generalized fatigue, nausea, vomiting and abdominal pain found to have pancolitis, leukocytosis and mildly elevated inflammatory markers.    Date of Admission: 01/03/2015  Current Hospital Stay:  LOS: 2 days      INTERVAL EVENTS:   - Colonoscopy    SUBJECTIVE: abdominal pain is modestly improved. She reports b/l hand swelling, has nearby IVs. She has a mild headache. She is tolerating full liquids well.    OBJECTIVE:  Temperature:  [97.9 F (36.6 C)-98.9 F (37.2 C)] 98.7 F (37.1 C) (05/27 0720)  Blood pressure (BP): (134-207)/(80-128) 194/107 mmHg (05/27 0803)  Heart Rate:  [68-91] 68 (05/27 0803)  Respirations:  [17-35] 20 (05/27 0720)  Pain Score:  [-] 5 (05/27 0934)  O2 Device:  [-] None (Room air) (05/27 0437)  O2 Flow Rate (L/min):  [2 l/min] 2 l/min (05/26 1805)  SpO2:  [94 %-100 %] 95 % (05/27 0720)    Exam:   Gen: Alert, cooperative, nad  Head: pink conjunctiva, non-icteric sclera, PEERL, EOMI, MMM  Neck: No masses, LAD or thyromegaly  Heart: RRR, no m,r,g, no JVP or carotid bruits  Lungs: CTAB no crackles, rales, or wheezes, no increased work of breathing  Abd: soft, mild TTP RLQ, +BS  Ext: warm and well perfused, peripheral pulses intact, no LE edema  Neuro: A&Ox3, CN II-IX intact, strength and sensation grossly intact  Skin: No scars, rash, bruising, jaundice or dry skin.    Labs:  Laboratory data reviewed and significant for:     BMP:  138/3.3/99/23/<2/0.49/237 (05/27 1002)  CBC: pend  CRP 3.0 <- 3.1    C3 157, nl  C4 30, nl    A1c: 9.1%    Micro: none    Imaging:   01/03/15 CT Abd/Pelvis:  SMALL & LARGE BOWEL: Nearly all of the colon demonstrates wall thickening withmural stratification and with minimal adjacent stranding. Normal appendix.  BLADDER: Unremarkable  PELVIC ORGANS: Unremarkable  BONES: Degenerative changes noted along the  spine. Diffuse osteopenia noted.  SOFT TISSUES: Unremarkable    Historic:  Renal US 2012: no e/o renal artery stenosis    MRI/MRA Enterography 04/2011:  1. Patent aorta and mesenteric vessels. No evidence of occlusion, narrowing, or aneurysm or thickening.  2. No acute findings.    Assessment & Plan:  [name] is a 49 year old woman with pmh of essential HTN, DM2, anxiety, and colitis of unknown etiology who presents to the ED with generalized fatigue, nausea, vomiting and abdominal pain found to have pancolitis, leukocytosis and mildly elevated inflammatory markers.    # Nausea, Vomiting, Abdominal Pain, Colitis: uncertain etiology, now s/p C scope w/ only a ~8cm area of patchy erythema, non-specific. Possibly 2/2 gastro/coloparesis due to poorly controlled DM2.  - change antiemetics and pain meds to PO  - Con't Unasyn until biofire assay returns, can DC Abx if not infectious  - repeat CRP prior to DC  - f/u w/ GI as OP  - f/u rheum workup  - Con't home Bentyl    # Generalized Fatigue/Weakness: non-specific, no objective data to suggest a particular diagnosis.    # Hypokalemia:   - KCl 40 mEq x1 now    # Respiratory Alkalosis w/ Anion Gap:   - f/u repeat BMP    # Essential HTN:  - Clonidine  0.3 mg TID  - Coreg 25 mg bid  - HCTZ 25 mg po qday  - Lisinopril 40 bid  - PRN Hydral    # Insulin-Dependent DM2: on glargine 36 units qhs, and aspart 12 units tid at home. A1c 9.1%.  - Glargine 30 units  - Aspart 8 units tid w/ meals  - Medium SSI    Nutrition: full liquid  Fluid: none  Electrolytes: replacing as needed  DVT PPx: lovenox  Constipation PPx: senna, miralax  GI PPx: lanzoprazole  Dispo: med/surg, possibly home today  Code Status: full code    Tressie Ellis) Coralee North, MD  Internal Medicine PGY-1  Pager: [phone]    ATTENDING PROGRESS NOTE ATTESTATION    Subjective    I have reviewed the history.  Interval history:  Colo done yesterday.  Tolerating diet today.    Objective    I have examined the patient and concur  with the resident exam.    Assessment and Plan    I agree with the resident care plan.  Symptoms controlled, tolerating PO's, and infectious workup negative.  Can d/c abx and stable for d/c home with outpatient f/u.  Ddx infectious vs IBD, less likely ischemic or vasculitic.  Total time coordinating care and d/c 40 min.  Refer to d/c summary for details.    See the resident note for further details.

## 2015-01-05 NOTE — Interdisciplinary (Signed)
01/05/15 0901   Patient Information   Why is Patient in the Hospital? 49 year old woman with pmh of essential HTN, DM2, anxiety, and colitis of unknown etiology who presents to the ED with generalized fatigue, nausea, vomiting and abdominal pain found to have pancolitis, leukocytosis and mildly elevated inflammatory markers   Prior to Level of Function Ambulatory/Independent with ADL's   Assistive Device Not applicable   Income Information   Income Source Unemployed  (Patient states she is on the process of applying for  temporary disability)   Referral To   Therapist, nutritionalinancial Resources ([facility])   Discharge Planning   Living Arrangements (Lives with Life Partner Gay FillerChris Starnes but per Pt he will be in [place]  [place] until July for work)   Support Systems Spouse / significant other;Family member(s)  (Life Partner- Gay FillerChris Starnes [phone], Patient states  all her family is in [place] and she is alone here in [place] right now)   Type of Residence (8 level Apartment with Elevators- [address] [place] [id number])   [facility] [address] U[facility] [address]   Patient expects to be discharged to: DC to home when medically cleared, PT attempted to eval Patient,( please see note) CM will follow for any clinical dcp needs.

## 2015-01-05 NOTE — Progress Notes (Cosign Needed)
Daily Progress Note for 01/04/2015   Patient: Katherine Church, 04-May-1966, [id number]   Location: [facility]  Length of stay:   2 days - Admitted on: 01/03/2015   ?  ID    Katherine Church is a 49 year old female with history of HTN, DM, and hospitalizations for colitis who was BIBA for severe abd pain, nausea, and vomiting, and found to have pancolitis on the CT scan, leukocytosis. Anion gap of 23, CRP 3.1, lactate 2.4.    Subjective   Interval Events  Hypertensive episodes over night.  10 minutes chest pain. No other associated symptoms.  Left hand swelling yesterday afternoon. Additional right hand swelling this morning.    Patient reports improvement in symptoms, but still endorses 8/10 episodic abdominal pain from the lower abdomen characterized as a "spasms.' Reports nausea.       ROS  GENERAL: + nausea. no fever/chills, weight change,  fatigue, night sweats, general weakness, diaphoresis.  SKIN: swelling of hands   HEENT: no HA, vertigo, head trauma, vision changes, hearing changes, discharge, itchiness, sore throat  BREASTS: No lumps, discharge, pain, galactorrhea, enlargement, or lesions  PULM: + SOB. No cough, sputum, hemoptysis, wheezing, stridor, infections, asthma, COPD, apnea, pleuritis  CVD: +chest pain. no HTN, CAD, CHF, PVD, palpitations, edema, orthopnea, claudication  GI: No n, v, d, abdominal pain, emesis, hematemesis, constipation, GERD.  GU: No frequency, urgency, polyuria, nocturia, flank pain, hematuria, discharge, STDs, UTIs  OB/Gyn: No dysmenorrhea, postmenopausal bleeding,   NEURO: No vertigo, HA, syncope, seizures, altered sensation, weakness, tremor, speech disorder  HEME/ONC: No pallor, bleeding, bruising, swollen lymph nodes.  ENDO: No lethargy, hot/cold intolerance, nervousness, polyuria, polydypsia  PSYCH: No nervousness, depression, anxiety, problems with attention, hallucinations, delusions.     Objective   Temperature:  [97.9 F (36.6 C)-98.9 F (37.2 C)] 98.5 F (36.9 C)  (05/27 0437)  Blood pressure (BP): (134-207)/(80-128) 167/94 mmHg (05/27 0437)  Heart Rate:  [71-91] 71 (05/27 0437)  Respirations:  [17-35] 20 (05/27 0437)  Pain Score:  [-] 9 (05/27 0437)  O2 Device:  [-] None (Room air) (05/27 0437)  O2 Flow Rate (L/min):  [2 l/min] 2 l/min (05/26 1805)  SpO2:  [94 %-100 %] 97 % (05/27 0437)    Wt Readings from Last 1 Encounters:   01/03/15 78.472 kg (173 lb)       General: NAD, well-developed, well-nourished.   HEENT: NC/AT, PERRLA, EOMI, conjunctivae clear, MMM, OP clear w/o erythema  Neck: Supple, no LAD  CV: RRR, No Murmurs/Rubs/Gallops  Lungs: CTAB, no crackles or wheezes. Speaks in full sentences.   Abdomen: TTP over left and right lower quadrants. BS+, Soft, ND.  Extremities: DP/PT 2+, warm, well-perfused, no c/c/e  Skin: No obvious rashes or lesions  Neuro: AOx4, attentive, cooperative, CN II-XII grossly intact, no focal deficits, UE/LE strength 5/5, normal gait, patellar reflex 2+  Psych: Normal mood and affect, no SI/HI    Data Review     LABS  Reviewed and pertinent for:  Recent Labs      01/03/15   0333  01/04/15   0607   WBC  14.7*  7.6   RBC  5.28*  4.29   HGB  15.1  12.1   HCT  42.6  35.8   MCV  80.7  83.4   MCHC  35.4  33.8   RDW  13.2  13.5   PLT  255  184   MPV  9.6  9.4   SEG  76*   --  LYMPHS  19   --    MONOS  4*   --    EOS  1   --      Recent Labs      01/03/15   0333  01/03/15   0617  01/04/15   0607   NA  138  137  144   K  4.0  3.8  3.3*   CL  95*  97*  103   BICARB  20*  21*  24   BUN  13  11  4*   CREAT  0.51  0.47*  0.56   GLU  315*  297*  125*   CA  10.0  9.3  8.6   ALB  4.6   --    --    MG   --    --   2.1   PHOS   --    --   5.2*       Results for Katherine, Church (MRN [id number]) as of 01/05/2015 07:07   Ref. Range 01/03/2015 18:15 01/03/2015 20:30 01/04/2015 04:49 01/04/2015 12:37 01/04/2015 18:39 01/04/2015 20:46 01/05/2015 00:37 01/05/2015 06:11   POC Glucose (mg/dL) Default Range: 70 mg/dL - 115 mg/dL 112 mg/dL 128 mg/dL (A) 125 mg/dL (A) 208 mg/dL  (A) 103 mg/dL 141 mg/dL (A) 157 mg/dL (A) 138 mg/dL (A)     Recent Labs      01/03/15   0333   TBILI  0.62   TP  8.7*   AST  19   ALK  113   ALT  37*       Recent Labs      01/03/15   0333   CPK  70   CKMBH  <1.0   TROPONIN  <0.01       Special Coagulation Studies   DRWT, Patient: 38.6  DRWT, Control: 33.2  DRWT, Ratio (Pt/Con): 1.16    Cardiolipin IgG, Ab: <10    MICRO:  GI pathogen NADT  pending  Pathology tissue exam pending  Stool O&P in process     Colonoscopy   Colonic mucosa of 10 cm segment of sigmoid colon appears erythematous, with patchy petechial erythema, biopsies were obtained. Rest of the colonic mucosa appear    Petechial erythema at the sigmoid colon, biopsied.   Rest of the colon appears normal.s normal. No diverticuli were identified. Terminal ileum mucosa appears normal. Biopsies obtained from terminal ileum, right and left colon for histology.    Follow biopsies results.  Continue antibiotics.  Follow up stool studies including clostridium difficile testing.  Rest of recommendations per inpatient GI team.    Consults  IP CONSULT TO GASTROENTEROLOGY      Assessment and Plan   Arretta Toenjes is a 49 year old female w/ a pmhx of HTN, DM, and colitis.       #ABD pain/nausea/vomit: ABD spasms, diarrhea, vomiting. Extensive history starting 2012. Failure of Abx after one month to relieve symptoms. DDx includes: infectious, inflammatory, IBS, and vasculitis. Colonoscopy inconclusive. Normal ESR, C3, C4, DRVVT ratio, and cardiolipin. Infectious mostly likely.    -stool O&P and culture in process. GI NADT in process  Anti-emetics w/ Phenergan, Zofran PRN  - Pain control w/ Hydromorphone PRN  - Fluid resuscitation, bolus in the ED  - Continue Unasyn  - Continue home Bentyl    #Chest pain: 10 minutes CP no additional symptoms. Most likely muscular in origin. Hx of HTN, and difficulty walking makes angina a  concern.   -Monitor vitals and symptoms    #HTN: Patient had hypertensive episodes last night.  Has had previous workup for secondary HTN causes, which all came back within normal limits.   -increase carvedilol to 25 DIB  -Increase clonidine .3 mg TID      #DM2:   -Increase insulin sliding scale    Terrance Mass, MS3    Meds   Scheduled Meds   ampicillin-sulbactam (UNASYN) IVPB  3,000 mg Q6H    carvedilol  12.5 mg BID WC    cloNIDine  0.3 mg BID    dicyclomine  10 mg TID AC    hydrochlorothiazide  25 mg Daily    insulin glargine  30 Units HS    insulin regular  1-10 Units Q6H    lansoprazole  30 mg Daily    latanoprost  1 drop HS    lisinopril  40 mg BID    polyethylene glycol  17 g Daily    ramelteon  8 mg HS    senna  2 tablet HS    sodium chloride (PF)  3 mL Q8H       PRN Meds   acetaminophen  650 mg Q4H PRN    Or    acetaminophen  650 mg Q4H PRN    Or    acetaminophen  650 mg Q4H PRN    dextrose  12.5 g PRN    glucagon  1 mg Once PRN    glucose  4 tablet PRN    glucose  1 Tube PRN    HYDROmorphone  0.5 mg Q4H PRN    HYDROmorphone  0.5 mg Q4H PRN    HYDROmorphone  1 mg Q4H PRN    nalOXone  0.1 mg Q2 Min PRN    ondansetron  4 mg Q6H PRN    prochlorperazine  10 mg Q6H PRN    sodium chloride (PF)  3 mL PRN    sodium chloride   Continuous PRN       IV Meds   sodium chloride         Allergies:   Allergies   Allergen Reactions    Ciprofloxacin Rash    Flagyl [Metronidazole Hcl] Rash    Sorbitol Unspecified    Steroids [Corticosteroids] Other     Blood pressure went very high    Tetanus Toxoids Swelling

## 2015-01-05 NOTE — Plan of Care (Signed)
Problem: Alteration in Blood Glucose  Goal: Glucose level within specified parameters  Outcome: Not Met  FS @ 12MN was 157 thus gave 1 unit Regular insulin for coverage. Gave 30 units Lantus subcu @ 2100 last night.    Problem: Falls, Risk of  Goal: Keep patient free from falls utilizing universal fall precautions  Outcome: Met  No fall occurred tonight. Patient is wearing non skid socks. Instructed to call when in need of anything or assistance. Call light with in reach. Bedside commode provided. Hourly rounds done.     Problem: Discharge Planning  Goal: Participation in care planning  Outcome: Not Met  No D/C orders yet this time. Ongoing POC like FS q6 hrs , IV Ampicilin q8 hrs, safety precs & IV Fluid infusion     Problem: Tissue Perfusion, Cardiopulmonary - Altered  Goal: Hemodynamic stability  Outcome: Met  Pt's BP has been better after her pain was managed by giving prn IV Dilaudid q4 hrs for c/o abdl pain & cramps. Latest BP was 146/90.     Problem: Pain - Acute  Goal: Communication of presence of pain  Outcome: Met  Pain c/o in her abdomen attained relief when getting q4 hrs prn IV Dilaudid. Will continue to assess and reassess pain

## 2015-01-05 NOTE — Discharge Instructions (Signed)
Diagnosis and Reason for Admission    You were admitted to the hospital for the following reason(s):  Abdominal pain, colitis    Your full diagnosis list is located on this After Visit Summary in the Hospital Problems section.    What Happened During Your Hospital Stay    The main tests and treatments done for you during this hospitalization were:    Colonoscopy  CT Abdomen and Pelvis    The following evaluation is still important to complete after discharge from the hospital:  - The GI doctors will call you about your biopsy results  - You should follow up with your primary care provider    Instructions for After Discharge    Your diet at home should be a Low FODMAP (see below), lots of fiber, fruits and vegetables diet.    Your activity level at home should be:  as much exercise or activity as you can tolerate.    Specific activity restrictions:    None    Wound or tube care instructions:  None    Your medication list is located on this After Visit Summary in the Current Discharge Medication List section.  Your nurse will review this information with you before you leave the hospital.    It is very important for you to keep a current medication list with you in order to assist your doctors with your medical care.  Bring this After Visit Summary with you to your follow up appointments.    Reasons to Contact a Doctor Urgently    Call 911 or return to the hospital immediately if:  You develop bloody stools    You should contact either your primary care physician or your hospital physician for any of the following reasons: Medication questions    If you have any questions about your hospital care, your medications, or if you have new or concerning symptoms soon after going home from the hospital, and you need to contact your hospital physician, your hospital physician can be contacted in the following manner:  [facility] operator at [phone].    Once you are able to see your primary care physician (PCP),  your PCP will then be responsible for further medication refills, or appointment referrals.    What Needs to Happen Next After Discharge -- Appointments and Follow Up    Any appointments already scheduled at [facility] will be listed in the Future Appointments section at the top of this After Visit Summary.  Any appointments that have been requested, but have not yet been scheduled, will be listed below that under Post Discharge Referrals.    Sometimes tests performed in the hospital do not yet have results by the time a patient goes home.  The following key tests will need to be followed up at your next appointment: Pathology evaluation of colon biopsies    Medical Home Information    Your primary care provider or clinic currently on file at [facility] is: Lauris Poag    Handouts Given to You (if applicable)    Diet recommendations -- Avoid foods that appear to aggravate your symptoms. These may include milk and dairy products, certain fruits or vegetables, whole grains, artificial sweeteners, and/or carbonated beverages. Keep a record of foods and beverages to help to pinpoint which foods are bothersome (form 1).  If you are lactose intolerant, do not consume products that contain lactose (table 1), or use a lactose-digestive aid such as lactose-reduced milk or over-the-counter lactase supplements (  eg, Lactaid tablets or liquid). Take a calcium supplement if you avoid milk products (see "Patient information: Calcium and vitamin D for bone health (Beyond the Basics)"). Avoiding foods high in fructose will help if you have fructose intolerance. (See 'Intolerance to food sugars' above.)  Over-the-counter medications -- Try an over-the-counter product that contains simethicone, such as certain antacids (eg, Maalox Anti-Gas, Mylanta Gas, Gas-X, Phazyme). Simethicone causes gas bubbles to break up and is widely used to relieve gas, although its benefit is questionable.  Try an over-the-counter product that contains  activated charcoal (eg, CharcoCaps, CharcoAid). The benefit of activated charcoal is unclear, although it is reasonable to try.  Try Beano, an over-the-counter preparation that helps to breakdown certain complex carbohydrates. This treatment may be effective in reducing gas after eating beans or other vegetables that contain raffinose.  Try bismuth subsalicylate (eg, Pepto-Bismol) to reduce the odor of unpleasant-smelling gas.      (The below diagnosis has NOT been confirmed, but it somewhat fits and there may be some helpful tips below)  IRRITABLE BOWEL SYNDROME OVERVIEW -- Irritable bowel syndrome (IBS) is a chronic condition of the digestive system. Its primary symptoms are abdominal pain and altered bowel habits (eg, constipation and/or diarrhea), but these symptoms have no identifiable cause.  IBS is the most commonly diagnosed gastrointestinal condition and is second only to the common cold as a cause of absence from work. An estimated 10 to 20 percent of people in the general population experience symptoms of IBS, although only about 15 percent of affected people actually seek medical help.  Several treatments and therapies are available for irritable bowel syndrome. These measures help alleviate symptoms but do not cure the condition. The chronic nature of irritable bowel syndrome and the challenge of controlling its symptoms can be frustrating for both patients and healthcare providers.  IRRITABLE BOWEL SYNDROME CAUSES -- There are a number of theories about how and why irritable bowel syndrome (IBS) develops. Despite intensive research, the cause is not clear.  ?One theory suggests that irritable bowel syndrome is caused by abnormal contractions of the colon and intestines (hence the term "spastic bowel," which has sometimes been used to describe irritable bowel syndrome). Vigorous contractions of the intestines can cause severe cramps, providing the rationale for some of the treatments of IBS, such as  antispasmodics and fiber (both of which help to regulate the contractions of the colon). However, abnormal contractions do not explain irritable bowel syndrome in all patients, and it is unclear whether the contractions are a symptom or cause of the disorder.  ?Some people develop irritable bowel syndrome after a severe gastrointestinal infection (eg, Salmonella or Campylobacter, or viruses). It is not clear how the infection triggers IBS to develop, and most people with irritable bowel syndrome do not have a history of these infections.  ?People with irritable bowel syndrome who seek medical help are more likely to suffer from anxiety and stress than those who do not seek help. Stress and anxiety are known to affect the intestine; thus, it is likely that anxiety and stress worsen symptoms. However, stress or anxiety is probably not the cause. Some studies have suggested that irritable bowel syndrome is more common in people who have a history of physical, verbal, or sexual abuse.  ?Food intolerances are common in patients with irritable bowel syndrome, raising the possibility that it is caused by food sensitivity or allergy. This theory has been difficult to prove, although it continues to be studied. The best way  to detect an association between symptoms of irritable bowel syndrome and food sensitivity is to eliminate certain food groups systematically (a process called an elimination diet), which should only be considered for patients in the care of a doctor or nutritionist. Eliminating foods without assistance can lead to omission of important sources of nutrition. In addition, unnecessary dietary restrictions can further worsen a person's quality of life.  A number of foods are known to cause symptoms that mimic or aggravate irritable bowel syndrome, including dairy products (which contain lactose), legumes (such as beans), and cruciferous vegetables (such as broccoli, cauliflower, Brussels sprouts, and  cabbage). These foods increase intestinal gas, which can cause cramps. Several medications also have effects on the intestines that may contribute to symptoms.  ?Many researchers believe that irritable bowel syndrome is caused by heightened sensitivity of the intestines to normal sensations (so-called "visceral hyperalgesia"). This theory proposes that nerves in the bowels are overactive in people with irritable bowel syndrome, so that normal amounts of gas or movement are perceived as excessive and painful. Some patients with severe irritable bowel syndrome feel better when treated with medications that decrease pain perception in the intestine (such as low doses of imipramine or nortriptyline). (See 'Antidepressants' below.)  SYMPTOMS OF IRRITABLE BOWEL SYNDROME -- Irritable bowel syndrome (IBS) often begins in young adulthood. Women are twice as likely as men to be diagnosed with irritable bowel syndrome in the [place] and other [place] countries. In other countries, an equal number of men and women are diagnosed with irritable bowel syndrome. The most common symptom of irritable bowel syndrome is abdominal pain in association with changes in bowel habits (diarrhea and/or constipation).  Abdominal pain -- Abdominal pain is typically crampy and varies in intensity. Some people notice that emotional stress and eating worsen the pain, and that having a bowel movement relieves the pain. Some women with irritable bowel syndrome notice an association between pain episodes and their menstrual cycle.  Changes in bowel habits -- Altered bowel habits are a second symptom of irritable bowel syndrome. This can include diarrhea, constipation, or alternating diarrhea and constipation. If diarrhea is the more common pattern, the condition is called diarrhea-predominant irritable bowel syndrome; if constipation is more common, the condition is called constipation-predominant irritable bowel syndrome.  Diarrhea -- A person  with irritable bowel syndrome may have frequent loose stools. Bowel movements usually occur during the daytime, and most often in the morning or after meals. Diarrhea is often preceded by a sense of extreme urgency and followed by a feeling of incomplete emptying. About one-half of people with IBS also notice mucous discharge with diarrhea. Diarrhea occurring during sleep is very unusual with IBS. (See "Patient information: Chronic diarrhea in adults (Beyond the Basics)".)  Constipation -- The constipation of irritable bowel syndrome can be intermittent and last for days. Stools are often hard and pellet-shaped. You may not feel empty after a bowel movement, even when the rectum is empty. This faulty sensation can lead to straining and sitting on the toilet for prolonged periods of time. (See "Patient information: Constipation in adults (Beyond the Basics)".)  Other symptoms -- Other symptoms of irritable bowel syndrome include bloating, gas, and belching.  IRRITABLE BOWEL SYNDROME DIAGNOSIS -- Several intestinal disorders have symptoms that are similar to irritable bowel syndrome. Examples include malabsorption (abnormal absorption of nutrients), inflammatory bowel disease (such as ulcerative colitis and Crohn disease), celiac disease, and microscopic colitis (uncommon diseases associated with intestinal inflammation).  Because there is no single  diagnostic test for irritable bowel syndrome, many clinicians compare your symptoms to formal sets of diagnostic criteria. However, these criteria are not accurate in distinguishing irritable bowel syndrome from other conditions in everyone. Thus, a medical history, physical examination, and select tests can help to rule out other medical conditions.  Tests -- Most clinicians order routine blood tests in people with suspected irritable bowel syndrome; these tests are usually normal, but they can help rule out other medical conditions.  Some clinicians also order more  invasive tests, such as sigmoidoscopy or colonoscopy, especially in people over age 33 years. (See "Patient information: Colonoscopy (Beyond the Basics)" and "Patient information: Flexible sigmoidoscopy (Beyond the Basics)".)  IRRITABLE BOWEL SYNDROME TREATMENT -- There are a number of different treatments and therapies for irritable bowel syndrome (IBS) [1]. Treatments are often given to reduce the pain and other symptoms of irritable bowel syndrome, and it may be necessary to try more than one combination of treatments to find the one that is most helpful for you. (See "Treatment of irritable bowel syndrome in adults".)  Treatment is usually a long-term process; during this process, it is important to communicate with your healthcare provider about symptoms, concerns, and any stressors or home/work/family problems that develop.  Monitor symptoms -- The first step in treating irritable bowel syndrome is usually to monitor symptoms, daily bowel habits, and any other factors that may affect your bowels. This can help to identify factors that worsen symptoms in some people with IBS, such as lactose or other food intolerances and stress. A daily diary can be helpful (form 1).  Diet changes -- It is reasonable to try eliminating foods that may aggravate irritable bowel syndrome, although this should be done with the assistance of a healthcare provider. Eliminating foods without assistance can potentially worsen symptoms or cause new problems if important food groups are omitted.  Lactose -- Many clinicians recommend temporarily eliminating milk products, since lactose intolerance is common and can aggravate irritable bowel syndrome or cause symptoms similar to IBS. The greatest concentration of lactose is found in milk and ice cream, although it is present in smaller quantities in yogurt, cottage and other cheeses, and any prepared foods that contain these ingredients (table 1).  All lactose-containing products should  be eliminated for two weeks. If IBS symptoms improve, it is reasonable to continue avoiding lactose. If symptoms do not improve, you may resume eating lactose-containing foods.   Foods that cause gas -- Many foods are only partially digested in the small intestines. When they reach the colon (large intestine), further digestion takes place, which may cause gas and cramps. Eliminating these foods temporarily is reasonable if gas or bloating is bothersome.  The most common gas-producing foods are legumes (such as beans) and cruciferous vegetables (such as cabbage, Brussels sprouts, cauliflower, and broccoli). In addition, some people have trouble with onions, celery, carrots, raisins, bananas, apricots, prunes, sprouts, and wheat. (See "Patient information: Gas and bloating (Beyond the Basics)".)  Foods that are easier -- The following table provides a list of foods that may be easier to digest in people with IBS (table 2).  Increasing dietary fiber -- Increasing dietary fiber (either by adding certain foods to the diet or using fiber supplements) may relieve symptoms of IBS, particularly if you have constipation (table 3). By reading the product information panel on the side of the package, you can determine the number of grams of fiber per serving (figure 1). Fiber may also be helpful in some people  with diarrhea-predominant symptoms since it can improve the consistency of stools. (See "Patient information: High-fiber diet (Beyond the Basics)".)  A bulk-forming fiber supplement (such as psyllium or methylcellulose) may also be recommended to increase fiber intake since it is difficult to consume enough fiber in the diet. Fiber supplements should be started at a low dose and increased slowly over several weeks to reduce the symptoms of excessive intestinal gas, which can occur in some people when beginning fiber therapy.  Fiber can make some people with irritable bowel syndrome more bloated and uncomfortable. If this  happens, it is best to decrease fiber intake and consider other laxative treatments for constipation. (See "Patient information: Constipation in adults (Beyond the Basics)".)  Psychosocial therapies -- Stress and anxiety can worsen irritable bowel syndrome in some people. The best approach for reducing stress and anxiety depends upon your situation and the severity of your symptoms. Have an open discussion with your clinician about the possible role that stress and anxiety could be having on your symptoms, and together decide upon the best course of action.  ?Some people benefit from formal counseling, with or without antidepressant or antianxiety medications [2]. Other treatments, such as hypnosis and cognitive behavioral therapy may also be helpful. Hypnosis is a state of altered consciousness that allows you to focus away from your anxiety or stress. Patients who are hypnotized are not sleeping, but are actually in a state of heightened imagination, similar to daydreaming. An expert can hypnotize an individual or you can learn self-hypnosis techniques.  Cognitive behavioral therapy helps you to focus on a particular problem in a limited time period. You learn how your thoughts contribute to anxiety or stress and learn how to change these thoughts.  ?Participation in a support group can also be valuable.  ?Many patients find that daily exercise is helpful in maintaining a sense of well-being. Exercise can also have favorable effects on the bowels. (See "Patient information: Exercise (Beyond the Basics)".)  Irritable bowel syndrome medications -- Although many drugs are available to treat the symptoms of irritable bowel syndrome, these drugs do not cure the condition. They are primarily used to relieve symptoms. The choice among these medications depends in part upon whether you have diarrhea, constipation, or pain-predominant irritable bowel syndrome.  As a general rule, medications are reserved for people whose  symptoms have not adequately responded to more conservative measures such as changes in diet and fiber supplements.  Anticholinergic medications -- Anticholinergic drugs block the nervous system's stimulation of the gastrointestinal tract, helping to reduce severe cramping and irregular contractions of the colon.  Drugs in this category include dicyclomine (Bentyl) and hyoscyamine (Levsin). These drugs may be particularly helpful when taken preventively (ie, before symptoms) and thus are most helpful if you can predict the onset of your symptoms. Common side effects include dry mouth and eyes and blurred vision.  Antidepressants -- Many tricyclic agents (TCAs) have a pain relieving effect in people with irritable bowel syndrome. The dose of TCAs is typically much lower than that used for treating depression. It is believed that these drugs reduce pain perception when used in low doses, although the exact mechanism of their benefit is unknown.  TCAs commonly used for pain management include amitriptyline, imipramine, desipramine, and nortriptyline. It is common to experience fatigue when starting a TCA; this is not always an undesirable side effect, since it can help improve sleep when TCAs are taken in the evening. TCAs are generally started in low doses and increased  gradually. Their full effect may not be seen for three to four weeks.  TCAs also slow movement of contents through the gastrointestinal tract and may be most helpful in people with diarrhea-predominant irritable bowel syndrome.  Another class of antidepressants, the selective serotonin reuptake inhibitors (SSRIs), may be recommended if you have both irritable bowel syndrome and depression. Common SSRIs include fluoxetine (Prozac), sertraline (Zoloft), paroxetine (Paxil), citalopram (Celexa), and escitalopram (Lexapro) Other antidepressant medications that may be recommended include mirtazapine (Remeron), venlafaxine (Effexor), and duloxetine  (Cymbalta). (See "Patient information: Depression treatment options for adults (Beyond the Basics)".)  Antidiarrheal drugs -- The drugs loperamide (Imodium) or diphenoxylate with atropine (Lomotil) can help slow the movement of stool through the digestive tract. Loperamide and diphenoxylate/atropine are most helpful if you have diarrhea-predominant irritable bowel syndrome. However, clinicians usually recommend that these drugs should only be used as needed rather than on a continuous basis. Eluxadoline (Viberzi) has been approved for the treatment of irritable bowel syndrome with diarrhea but is not commercially available.  Anti-anxiety drugs -- Anti-anxiety drugs reduce anxiety. Diazepam (Valium), lorazepam (Ativan), and clonazepam (Klonopin) belong to this class of drugs. Anti-anxiety drugs are occasionally prescribed for people with short-term anxiety that is worsening their irritable bowel syndrome symptoms. However, these drugs should only be taken for short periods of time since they can be addictive.  Alosetron -- Alosetron (Lotronex) blocks a hormone that is involved in intestinal contractions and sensations. It is approved to treat women with irritable bowel syndrome whose predominant symptom is diarrhea. However, it was withdrawn from the market soon after its introduction because of concerns related to safety. It was reintroduced and is currently available, although certain prescribing guidelines must be followed.  Lubiprostone -- Lubiprostone (Amitiza) is available for treatment of severe constipation and irritable bowel syndrome in women over 18 years who have not responded to other treatments. It works by increasing intestinal fluid secretion. It is expensive compared to other agents. Further testing is needed to clarify the effectiveness and long-term safety of lubiprostone.  Linaclotide -- Linaclotide (Linzess) has been approved for treatment of constipation and irritable bowel syndrome in persons  over 18 years who have not responded to other treatments. It works by increasing intestinal fluid secretion. It is expensive as compared with other agents (except lubiprostone). Further studies are needed to clarify the effectiveness and long-term safety of linaclotide.  Antibiotics -- The role of antibiotics in the treatment of irritable bowel syndrome remains unclear. There are some patients whose irritable bowel syndrome symptoms benefit from antibiotic treatment. However, more research is needed before antibiotics are recommended for treatment of irritable bowel syndrome. Rifaximin (Xifaxan) has been approved for treatment of irritable bowel syndrome without constipation.  HERBS AND NATURAL THERAPIES FOR IRRITABLE BOWEL SYNDROME -- A number of herbal and natural therapies have been advertised (especially on the internet) for the treatment of irritable bowel syndrome. Unfortunately, there is no evidence supporting their benefit. Although small studies may support some of these therapies, the studies are either too small or have major flaws that make definitive conclusions impossible.  ?Peppermint oil - There is some evidence supporting the use of peppermint oil. Peppermint oil can cause or worsen heartburn.  ?Acidophilus - There is increasing interest in the possible beneficial effects of "healthy" bacteria (probiotics) in a variety of intestinal diseases, including IBS. Whether supplements containing these bacteria are of any benefit is unproven. (See "Probiotics for gastrointestinal diseases".)  ?Unproven - Chamomile tea is of unproven benefit in irritable bowel  syndrome. Furthermore, chamomile can aggravate allergies in people who tend to be allergic to grasses. Evening primrose oil, a supplement containing gamma linolenic acid, is of unproven benefit. Fennel seeds are of unproven benefit.  ?Potentially unsafe - Wormwood is of unproven benefit and may be unsafe. Wormwood oil can cause damage to the nervous  system. Comfrey is of unproven benefit and can cause serious liver problems.  IRRITABLE BOWEL SYNDROME PROGNOSIS -- Although irritable bowel syndrome can produce substantial physical discomfort and emotional distress, most people with irritable bowel syndrome do not develop serious long-term health conditions. Furthermore, the vast majority of people with irritable bowel syndrome learn to control their symptoms.  It is important to work with a clinician to monitor symptoms over time. If symptoms change over time, further testing may be recommended. Over time, less than 5 percent of people diagnosed with irritable bowel syndrome will be diagnosed with another gastrointestinal condition.  SUMMARY  ?Irritable bowel syndrome (IBS) is a common gastrointestinal disorder affecting approximately 10 to 20 percent of the population. Although the condition cannot be cured, treatments are available to alleviate symptoms.  ?No single cause of irritable bowel syndrome has been identified, although there are theories that gastrointestinal abnormalities, food intolerance, and psychological issues may be involved. (See 'Irritable bowel syndrome causes' above.)  ?The primary symptoms of irritable bowel syndrome are abdominal pain and changes in bowel habits (eg, diarrhea and/or constipation). Abdominal pain can vary in location and severity. Patients can experience primarily diarrhea, primarily constipation, or an alternating pattern of the two; additional gastrointestinal symptoms may also occur. (See 'Symptoms of irritable bowel syndrome' above.)  ?There is no single diagnostic test for irritable bowel syndrome, and several other gastrointestinal conditions produce similar symptoms; a patient's history, physical examination, and blood test results are all reviewed to rule out other disorders and establish a diagnosis of IBS. (See 'Irritable bowel syndrome diagnosis' above.)  ?There are many different treatments available to relieve  the symptoms of irritable bowel syndrome; these include the monitoring of symptoms and patterns, adjustment of the diet to increase fiber and eliminate foods that can worsen symptoms, psychosocial therapy (since stress may aggravate IBS), and medication. Treatments are often used in combination, and because of the variability of symptoms, different treatments work for different people. (See 'Irritable bowel syndrome treatment' above.)  ?Many herbal and natural therapies have been advertised for the treatment of irritable bowel syndrome; however, these therapies have not been proven effective and they are not recommended. (See 'Herbs and natural therapies for irritable bowel syndrome' above.)  ?Although irritable bowel syndrome can cause pain and stress, the majority of patients are able to control their symptoms and live a normal life without developing serious health problems. (See 'Irritable bowel syndrome prognosis' above.)  WHERE TO GET MORE INFORMATION -- Your healthcare provider is the best source of information for questions and concerns related to your medical problem.  This article will be updated as needed on our website [email or web address]). Related topics for patients, as well as selected articles written for healthcare professionals, are also available. Some of the most relevant are listed below.

## 2015-01-05 NOTE — Discharge Summary (Addendum)
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       Additional Hospital Diagnoses ("rule out" or "suspected" diagnoses, etc.):  Rule out inflammatory bowel disease, infectious colitis   Type 2 Diabetes  Essential HTN  IBS    Principal Procedure Performed During This Hospitalization (required):  Colonoscopy with biopsies    Other Procedures Performed During This Hospitalization (required):  CT abdomen and Pelvis  Blood Cultures  GI pathogen Panel  Stool Ova and Parasites    Procedure results are available in Chart Review in Epic.  For those providers external to [facility], the key procedure results are listed below:    Colonoscopy 01/04/15:  FINDINGS  Colonic mucosa of 10 cm segment of sigmoid colon appears erythematous, with patchy petechial erythema, biopsies were obtained. Rest of the colonic mucosa appears normal. No diverticuli were identified. Terminal ileum mucosa appears normal. Biopsies obtained from terminal ileum, right and left colon for histology.    ENDOSCOPIC DIAGNOSIS  Petechial erythema at the sigmoid colon, biopsied.   Rest of the colon appears normal.    CT Abdomen/Pelvis w/ IV Contrast 01/04/15  CT ABDOMEN  LUNG BASES: Unremarkable  LIVER: Fatty liver. Hepatomegaly, measuring 20.6 cm.  BILIARY: Unremarkable  SPLEEN: Unremarkable  PANCREAS: Atrophic  ADRENAL GLANDS: Unremarkable  KIDNEYS: Unremarkable  STOMACH/DUODENUM: Thickening of the distal esophagus is noted.  VASCULATURE: Unremarkable  LYMPHATIC: A 1 cm peripancreatic lymph node is again seen, unchanged.    CT PELVIS  SMALL & LARGE BOWEL: Nearly all of the colon demonstrates wall thickening withmural stratification and with  minimal adjacent stranding. Normal appendix.  BLADDER: Unremarkable  PELVIC ORGANS: Unremarkable  BONES: Degenerative changes noted along the spine. Diffuse osteopenia noted.  SOFT TISSUES: Unremarkable    Consultations Obtained During This Hospitalization:  Gastroenterology    Key consultant recommendations:  Gastroenterology:  Follow biopsies results.  Continue antibiotics  Follow up stool studies including clostridium difficile testing.    Reason for Admission to the Hospital / Initial Presentation: from initial H&P  History of present illness: 49 yo F with ~1 week of abdominal pain, N/V, increased fatigue for about a month. She has gone to an OSH ED x2 this past week and gotten CT x2 and was told she had diverticulitis and placed on abx, but came to [place] as she is not improved. She had an extensive GI and rheumatologic workup in 2013 for similar symptoms, at which point she had a CT showing a small area of colitis, but otherwise a negative MRI/MRA, unremarkable colo and negative rheum workup. She feels that her current symptoms are similar.    Hospital Course by Problem (required):  # Abdominal Pain, Nausea, Colitis: She was septic on presentation to the ED and given 3L IV fluid bolus, IV Dilaudid, IV anti-emetics, and started on Unasyn. Initial lactate was 2.5, CRP elevated at 3.1. CT scan abdomen/pelvis had evidence of "pan-colitis." GI was consulted and performed a colonoscopy on 5/26 that was only remarkable for a ~8cm patch of sigmoid erythema. There was no clear evidence of ulcerations or pseudomembranes. Biopsy results were pending at the time of  discharge. A GI Pathogen ([employer]) panel and stool ova and parasite panel were sent for infectious colitis and were negative for infectious causes. Unasyn was discontinued after the negative stool studies, and she was sent home with anti-emetics, dilaudid prn for pain control, and an increased dose of Bentyl for her abdominal cramps.    Tests Outstanding at  Discharge Requiring Follow Up:  Pathology evaluation of colon biopsies    Discharge Condition (required):  Stable.    Key Physical Exam Findings at Discharge:  No significant physical examination findings at the time of discharge.    Discharge Diet:  Low FODMAP.    Discharge Medications:     What To Do With Your Medications      START taking these medications       Add'l Info    clonidine 0.3 MG tablet   Commonly known as:  CATAPRES   Take 1 tablet (0.3 mg) by mouth every 8 hours.    Quantity:  60 tablet   Refills:  0       hydrALAZINE 25 MG tablet   Commonly known as:  APRESOLINE   Take 1 tablet (25 mg) by mouth 3 times daily as needed (180, hold for HR >110).    Quantity:  30 tablet   Refills:  0       prochlorperazine 10 MG tablet   Commonly known as:  COMPAZINE   Take 1 tablet (10 mg) by mouth every 6 hours as needed for Nausea/Vomiting.    Quantity:  30 tablet   Refills:  0         CHANGE how you take these medications       Add'l Info    dicyclomine 10 MG capsule   Commonly known as:  BENTYL   Take 2 capsules (20 mg) by mouth 3 times daily (before meals).    Quantity:  60 capsule   Refills:  0   What changed:  how much to take       hydrochlorothiazide 25 MG tablet   Commonly known as:  HYDRODIURIL   Take 1 tablet (25 mg) by mouth daily.    Quantity:  30 tablet   Refills:  0   What changed:    - medication strength  - how much to take  - how to take this  - when to take this       HYDROmorphone 4 MG tablet   Commonly known as:  DILAUDID   Take 1 tablet (4 mg) by mouth every 4 hours as needed for Severe Pain (Pain Score 7-10).    Quantity:  30 tablet   Refills:  0   What changed:    - medication strength  - how much to take  - reasons to take this       ondansetron 4 MG disintegrating tablet   Commonly known as:  ZOFRAN ODT   Take 1 tablet by mouth every 6 hours as needed for Nausea.    Quantity:  20 tablet   Refills:  0   What changed:  Another medication with the same name was removed. Continue taking this  medication, and follow the directions you see here.       senna 8.6 MG tablet   Commonly known as:  SENOKOT   Take 1 tablet by mouth daily.    Quantity:  30 tablet   Refills:  1   What changed:  Another medication with the same name was removed. Continue taking  this medication, and follow the directions you see here.         CONTINUE taking these medications       Add'l Info    atorvastatin 20 MG tablet   Commonly known as:  LIPITOR   Take 1 tablet by mouth daily.    Quantity:  90 tablet   Refills:  1       * docusate sodium 250 MG capsule   Commonly known as:  COLACE   Take 1 capsule by mouth 2 times daily.    Quantity:  60 capsule   Refills:  0       * docusate sodium 250 MG capsule   Commonly known as:  COLACE   Take 1 capsule by mouth 2 times daily.    Quantity:  60 capsule   Refills:  1       glucose blood test strip   1 strip by Other route 4 times daily (before meals and nightly).    Quantity:  100 strip   Refills:  5       insulin glargine 100 UNIT/ML injection   Commonly known as:  LANTUS   Inject 36 Units under the skin nightly.    Quantity:  10 mL   Refills:  5       insulin lispro 100 UNIT/ML injection   Commonly known as:  HUMALOG   Inject 12 Units under the skin 3 times daily (before meals).    Quantity:  10 mL   Refills:  5       lansoprazole 30 MG disintegrating tablet   Commonly known as:  PREVACID SOLUTAB   Take 1 tablet by mouth daily.    Quantity:  30 tablet   Refills:  0       * lisinopril 40 MG tablet   Commonly known as:  PRINIVIL, ZESTRIL   Take 1 tablet by mouth 2 times daily.    Quantity:  60 tablet   Refills:  1       * lisinopril 40 MG tablet   Commonly known as:  PRINIVIL, ZESTRIL   Take 1 tablet (40 mg) by mouth 2 times daily.    Quantity:  30 tablet   Refills:  0       polyethylene glycol packet   Commonly known as:  MIRALAX   Take 1 packet by mouth daily.    Quantity:  1 bottle   Refills:  0       * Notice:  This list has 4 medication(s) that are the same as other medications  prescribed for you. Read the directions carefully, and ask your doctor or other care provider to review them with you.      STOP taking these medications          amoxicillin-clavulanate 875-125 MG tablet   Commonly known as:  AUGMENTIN       blood glucose meter       CARVEDILOL PO       CURITY IODOFORM PACKING STRIP Misc       diazepam 5 MG tablet   Commonly known as:  VALIUM       Gauze Dressing 4"X4" Pads       HYDROcodone-acetaminophen 5-325 MG tablet   Commonly known as:  NORCO       insulin syringe-needle U-100 31G X 5/16" 0.3 ML       lancets       oxyCODONE-acetaminophen 5-325 MG tablet   Commonly known  as:  PERCOCET       PRILOSEC 20 MG capsule   Generic drug:  omeprazole         Where to Get Your Medications     You need to pick up these prescriptions. We sent them to a specific pharmacy, please go there to get them.           [facility]   -  clonidine 0.3 MG tablet   -  dicyclomine 10 MG capsule   -  hydrALAZINE 25 MG tablet   -  hydrochlorothiazide 25 MG tablet   -  lisinopril 40 MG tablet   -  prochlorperazine 10 MG tablet    [address]   [place] [id number]   Phone:  [phone]   Hours:  Mon-Fri 9am-7:00pm, Sat-Sun 9am-5:00pm              Please check with staff for printed prescription or if prescription was faxed to your pharmacy.   -  HYDROmorphone 4 MG tablet                      Allergies:  Allergies   Allergen Reactions    Ciprofloxacin Rash    Flagyl [Metronidazole Hcl] Rash    Sorbitol Unspecified    Steroids [Corticosteroids] Other     Blood pressure went very high    Tetanus Toxoids Swelling       Discharge Disposition:  Home.    Discharge Code Status:  Full code / full care  This code status is not changed from the time of admission.    Follow Up Appointments:    Scheduled appointments:  No future appointments.  - make GI appointment  - make PCP appointment    For appointments requested for after discharge that have not yet been  scheduled, refer to the Post Discharge Referrals section of the After Visit Summary.    Discharging Physician's Contact Information:  [facility] operator at [phone].        Addendum    I have been asked to clarify that sepsis due to infectious process was present/suspected on presentation.  Although workup was negative, the most likely etiology of her presenting symptoms was still infectious colitis.

## 2015-01-05 NOTE — Interdisciplinary (Signed)
[facility] [employer] / [phone]       FYI pt Katherine GurneyWoody of [address] is c/o new swelling in her Rt hand plus some heaviness in her eyes. She said she's using a blue covered Glaucoma eye drops that she forget the name of this eye drop. Just FYI. Thanks. Fayrene FearingJames RN [phone]

## 2015-01-05 NOTE — Progress Notes (Signed)
Gastroenterology Progress Note  Fellow: Gerrit Heck  Consulting Physician: Dr. Tawnya Crook    24h events:     - pain improved  - colonoscopy was performed yesterday, results as below      Current Problem List:   Patient Active Problem List    Diagnosis Date Noted    Pancolitis ([place]) 01/03/2015    Sepsis(995.91) 10/06/2012     IMO Update      Cellulitis 10/06/2012    Tubular adenoma polyp of rectum 04/26/2011      During colonoscopy (03/2011) a biopsy of rectosigmoid region revealed a tubular adenoma. She was advised to follow up with GI for a repeat colonoscopy in 3-5 years.        Myofascial pain 10/07/2010    Piriformis syndrome 10/07/2010    Screening for breast cancer 03/02/2010    Screening for malignant neoplasm of breast 09/14/2009    Acute ischemic colitis 09/08/2009    Malignant hypertension 05/22/2009    Retinal hemorrhage 05/22/2009    Diabetes 05/18/2009    Hypertensive disorder 05/18/2009    Chronic back pain 05/18/2009    Anxiety 05/18/2009       Past Medical History:  Past Medical History   Diagnosis Date    Back pain     High blood pressure     Diabetes mellitus ([place])     Hyperlipidemia LDL goal < 100     Colitis, ulcerative ([place])        Past Surgical History:  Past Surgical History   Procedure Laterality Date    Pb anesth,tubal ligation/transection  1994    Colonoscopy  04/2011     Repeat surveillance colo in 2019    Colonoscopy procedure N/A 01/04/2015     Procedure: GI COLONOSCOPY;  Surgeon: Jessy Oto, MD;  Location: [facility];  Service: Gastroenterology;  Laterality: N/A;       Social History:  History     Social History    Marital Status: Single     Spouse Name: N/A    Number of Children: 2    Years of Education: N/A     Occupational History    unemployed      Social History Main Topics    Smoking status: Never Smoker     Smokeless tobacco: Never Used    Alcohol Use: No    Drug Use: No      Comment: remote history of cocaine.  Currently  occasional MJ smoking    Sexual Activity:     Partners: Male      Comment: tubal ligation     Other Topics Concern    Blood Transfusions No    Special Diet No    Exercise Yes     tries to walk     Social History Narrative    Two kids-adult in [place] (going thru med school [employer]); son still deciding.    Born and raised in [place].    Moved to [facility] in august 2010-boyfriend works for [employer].       Family History:  Family history of colorectal cancer or inflammatory bowel disease:  Family History   Problem Relation Age of Onset    Diabetes Father     Lipids Father     Hypertension Father     Hypertension Mother        Allergy:  Allergies   Allergen Reactions    Ciprofloxacin Rash    Flagyl [Metronidazole Hcl] Rash    Sorbitol Unspecified  Steroids [Corticosteroids] Other     Blood pressure went very high    Tetanus Toxoids Swelling       Current Medications:  Current Facility-Administered Medications   Medication    acetaminophen (TYLENOL) tablet 650 mg    Or    acetaminophen (TYLENOL) solution 650 mg    Or    acetaminophen (TYLENOL) suppository 650 mg    ampicillin-sulbactam (UNASYN) 3,000 mg in sodium chloride 0.9 % 100 mL IVPB    carvedilol (COREG) tablet 25 mg    clonidine (CATAPRES) tablet 0.3 mg    dextrose 50 % solution 12.5 g    dicyclomine (BENTYL) capsule 10 mg    glucagon (GLUCAGON) injection 1 mg    glucose chewable tablet 16 g    glucose oral gel 1 Tube    hydrALAZINE (APRESOLINE) tablet 25 mg    hydrochlorothiazide (HYDRODIURIL) tablet 25 mg    HYDROmorphone (DILAUDID) tablet 4 mg    insulin glargine (LANTUS) injection 30 Units    insulin lispro (HUMALOG) injection 1-10 Units    insulin lispro (HUMALOG) injection 8 Units    lansoprazole (PREVACID SOLUTAB) disintegrating tablet 30 mg    latanoprost (XALATAN) 0.005 % ophthalmic solution 1 drop    lisinopril (PRINIVIL, ZESTRIL) tablet 40 mg    nalOXone (NARCAN) injection 0.1 mg    ondansetron (ZOFRAN) tablet 4  mg    polyethylene glycol (MIRALAX) packet 17 g    prochlorperazine (COMPAZINE) tablet 10 mg    ramelteon (ROZEREM) tablet 8 mg    senna (SENOKOT) tablet 17.2 mg    sodium chloride (PF) 0.9 % flush 3 mL    sodium chloride (PF) 0.9 % flush 3 mL    sodium chloride 0.9 % TKO infusion       Review of Systems:  Constitutional: weakness  Eyes: Negative for vision changes   Ears/Throat: Negative for hearing loss or changes, sore throat, or hoarseness   CV: Negative for palpitations, orthopnea, or chest pain   Respiratory: Negative for cough, shortness of breath, sputum, or wheezing   Genitourinary: Negative for dysuria, frequency, urgency, nocturia, or hematuria   GI: See HPI/subjective above   Musculoskeletal: Negative for arthalgia, joint pain, or back problems   Skin: rash   Neuro: Negative for syncope, seizure, or dizziness   Psych: anxiety   Endocrine: diabetes  Allergic/Immunologic: See allergy list    Physical examination:  BP 146/97 mmHg   Pulse 73   Temp(Src) 98.5 F (36.9 C)   Resp 18   Ht $R'5\' 4"'yQ$  (1.626 m)   Wt 78.472 kg (173 lb)   BMI 29.68 kg/m2   SpO2 95%   LMP 06/02/2011 Body mass index is 29.68 kg/(m^2).  Wt Readings from Last 2 Encounters:   01/03/15 78.472 kg (173 lb)   12/01/13 72.576 kg (160 lb)      Blood Pressure   01/05/15 146/97   12/02/13 170/102     Temperature:  [97.9 F (36.6 C)-98.9 F (37.2 C)] 98.5 F (36.9 C) (05/27 1312)  Blood pressure (BP): (117-207)/(80-128) 146/97 mmHg (05/27 1312)  Heart Rate:  [68-91] 73 (05/27 1312)  Respirations:  [17-35] 18 (05/27 1312)  Pain Score:  [-] 5 (05/27 1348)  O2 Device:  [-] None (Room air) (05/27 1312)  O2 Flow Rate (L/min):  [2 l/min] 2 l/min (05/26 1805)  SpO2:  [94 %-100 %] 95 % (05/27 1312)    General: obese and in no apparent distress.   Affect: Normal, pleasant  HEENT: NC/AT,  no scleral icterus, mucus membranes moist.   Respiratory: Clear to auscultation bilaterally. Normal respiratory effort.   Cardiovascular: Regular rate and rhythm  without murmurs, rubs or gallops.   Abdomen: Soft. Mild tender on palpation lower abdomen and RLQ. Bowel sound present. Non-distended. No rebound. No guarding.  Extremities: No peripheral edema. Warm and well-perfused.   Skin: No rashes, no jaundice.  Lymphatics: No cervical, axillary or inguinal lymphadenopathy.   Neuro: Alert and oriented X3, no asterixis.    Labs:  I have reviewed the labs listed below.    CBC  Lab Results   Component Value Date    WBC 8.6 01/05/2015    HGB 12.6 01/05/2015    HCT 36.6 01/05/2015    MCV 82.4 01/05/2015    PLT 197 01/05/2015    PLT 219 05/21/2009     Chemistry  Lab Results   Component Value Date    NA 138 01/05/2015    K 3.3 01/05/2015    CL 99 01/05/2015    BICARB 23 01/05/2015    BUN <2 01/05/2015    CREAT 0.49 01/05/2015    GLU 237 01/05/2015     Liver  Lab Results   Component Value Date    TBILI 0.62 01/03/2015    DBILI <0.1 06/29/2013    AST 19 01/03/2015    ALT 37 01/03/2015    ALK 113 01/03/2015    ALB 4.6 01/03/2015     Coags  Lab Results   Component Value Date    INR 1.0 05/12/2013    PTT 31.1 05/12/2013       Images:  I have reviewed the imaging reports listed below.      CT abd pel 01/03/2015    CT scan with IV contrast    1). Pan colitis, likely from ulcerative colitis flare.    2). Hepatomegaly.    3). Final results discussed with Dr. Marcelina Morel 01/03/2015 at 10:46.    Endoscopy:    Colonoscopy 04/2011    Findings: 1- Normal Ascending colon except for nonspecific  patchy erythema.. Colonoscope could not be passed beyond  ascending colon secondary to stools. Biopsies were taken  (jar 1) from erythema in Right colon.  2- Normal transverse colon.  3- Descending colon was erythematous and punctate  erythema, w/ scant washable bloody mucosa, was seen from  splenic flexure till rectosigmoid junction. Biopsies were  taken (35-30cm) (jar 2), .  4- Distal Sigmoid/rectum. There was small amount of old  blood present in rectum. Rare erythema. vascular pattern  overall intact.  Biopsies taken from rectosigmoid (jar 3)      Endoscopic Diagnosis: Predominantly sigmoid inflammation  consistent with colon ischemia in setting of history, and  previously elevated lactate. There was no stricture or  ulceration seen. Follow up biopsies to help exclude less  likely Crohn's colitis.    Recommendations: -- Will work up for coagulopathy like  antiphospholipid syndrome, Protein C/S deficiency.  - Consider Vascular Consult, given repeated episodes of  likely ischemic colitis without known etiology.  -- Avoid NSAIDS  -- Avoid hypotension  - analgesia, keep well hydrate IV fluids.  -- Recommend checking C diff, treat if positive.  - Start clear liquid diet.      Pathology:    FINAL PATHOLOGIC DIAGNOSIS:  A: Colon, right, biopsy   -Focal minimal acute colitis, see comment.  B: Colon, 30 to 35 cm, biopsy   -Benign colonic mucosa with focal minimal acute colitis and lamina   propria fibrosis,  see comment.  C: Rectosigmoid colon, biopsy   -Focal mild acute colitis, see comment.  COMMENT: For parts A, B, and C, there is focal acute cryptitis without  evidence of architectural distortion. The differential diagnosis includes  an acute infectious colitis, ischemic colitis, drug injury including  NSAIDs, early changes of inflammatory bowel disease, and bowel preparation  injury. For part B, there are foci of lamina propria fibrosis with focal  withered appearing glands. The findings are suggestive of ischemia. These  superficial mucosal biopsies do not show larger vessels to evaluate for  vasculitis or thrombosis. Please correlate clinically.      COLONOSCOPY 01/04/15    FINDINGS  Colonic mucosa of 10 cm segment of sigmoid colon appears erythematous, with patchy petechial erythema, biopsies were obtained. Rest of the colonic mucosa appears normal. No diverticuli were identified. Terminal ileum mucosa appears normal. Biopsies obtained from terminal ileum, right and left colon for  histology.    ENDOSCOPIC DIAGNOSIS  Petechial erythema at the sigmoid colon, biopsied.   Rest of the colon appears normal.    RECOMMENDATIONS  Follow biopsies results.  Follow up stool studies including clostridium difficile testing.  Rest of recommendations per inpatient GI team.    Assessment and Plan:  Patient is a 49 year old female with a past medical history significant for multiple hospital stays for ischemic colitis, HTN, DM type II on insulin, presenting with abdominal pain, nausea, vomiting, diarrhea, found with pancolitis, with GI consulted to evaluate acute colitis.     Abdominal pain/pancolitis:  Unclear etiology for her pancolitis, differential is broad at this time, including infectious colitis, IBD, medication induced, eosinophilic, less likely ischemic. Colonoscopy was unremarkable with some petechial erythema at the sigmoid colon, biopsied. Pain is improved, inflammatory markers still high.    - Follow biopsies results.  - infectious work-up including: blood cx, ua, ucx, stool studies including c diff.: NGTD  - no need for antibiotics  - Correction of electrolyte abnormalities   - pain control per primary team  - Advanced diet as tolerated     Please page with any questions.    Patient discussed with attending, Dr. Tawnya Crook    Electronically Signed:   Gerrit Heck, MD  GI & Hepatology Fellow

## 2015-01-05 NOTE — Interdisciplinary (Signed)
Pt's significant other, Sharolyn DouglasChris Stearnes [phone] is a Microbiologistsubcontractor for [employer] and currently out of the stated until July. Pt relates her friend, Vickey HugerOphelia watson, [phone] will likely provide transportation assistance home. Pt has no other identified social needs.    Shona Pardo [phone]

## 2015-01-05 NOTE — Plan of Care (Signed)
Problem: Alteration in Blood Glucose  Goal: Glucose level within specified parameters  Outcome: Not Met  Pt bs 287. Pt given 8 units nutritional lispro SQ and 6 units SSI lispro. No s/s of glycemic reaction    Problem: Falls, Risk of  Goal: Keep patient free from falls utilizing universal fall precautions  Outcome: Met  High fall risk  Call light within reach, instructed to call for assistance, hourly rounding done. Bed locked in low position, side rails up x2. Safety measures maintained, room free of clutter. Bed alarm activated.  No fall injury noted.      Problem: Discharge Planning  Goal: Participation in care planning  Outcome: Met  No discharge order. Pt participated with POC.  Continued on IV ABX, pain control and n/v control    Problem: Tissue Perfusion, Cardiopulmonary - Altered  Goal: Hemodynamic stability  Outcome: Not Met  Pt HTN and on multiple HTN medications (See MAR). No s/s of stroke. Will continue to monitor    Problem: Pain - Acute  Goal: Communication of presence of pain  Outcome: Met  Pt c/o 7/10 L leg cramping. Pt medicated 19m po Dilaudud. On reassessment pain level 5/10 which is acceptable for patient. Will continue to monitor

## 2015-01-05 NOTE — Plan of Care (Signed)
Went over discharge instructions and patient belongings with patient. Educated on follow up appts and when to seek emergency care. Pt had no questions regarding care. Went over patient belongings and ensured no property was let behind.

## 2015-01-06 NOTE — Procedures (Signed)
Patient Name: Katherine Church   MR#: [id number]   Date/Time ofProcedure: 01/04/2015 05:28 PM   Endoscopist: Marcelino DusterMichael Leva Baine     GI Fellow: Daneil DanAthanasios Desalermos     Referring Physician:      PROCEDURE PERFORMED: COLONOSCOPY     INDICATIONS FOR EXAMINATION: This is a 49 yo female pt   presenting with abdominal pain found with pancolitis     INSTRUMENTS: 316     MEDICATIONS: Demerol 200 mg IVP, Versed 10 mg IVP, Benadryl 50   mg IVP   NEED FOR ANESTHESIA:Yes, please schedule future procedures with   anesthesia    The attending physician, Dr.Malika Demario Micheline MazeDocherty, was present for   the entire examination.     PROCEDURE TECHNIQUE: A physical exam was performed. Informed   consent was obtained from the patient after explaining all the   risks (perforation, bleeding, infection and adverse effects to   the medicine) , benefits and alternatives to the procedure which   the patient appeared to understand and so stated.  The patient   was connected to the monitoring devices and placed in the left   lateral position. Continuous oxygen was provided with a nasal   cannula and IV medicine administered through an indwelling   cannula. After adequate moderate sedation was achieved, a   digital exam was performed and the colonoscope was introduced   into the rectum and advanced under direct visualization to the   terminal ileum. The scope was subsequently removed slowly while   carefully examining the color, texture, anatomy, and integrity   of the mucosa on the way out. In the rectum, the scope was   retroflexed to evaluate for internal hemorrhoids and anorectal   pathology. The patient was subsequently transferred to the   recovery area in satisfactory condition.      COMPLICATIONS: None   ESTIMATED BLOOD LOSS: None   BIOPSY TAKEN: Yes   BOWEL PREP QUALITY: good   EXTENT OF EXAM: terminal ileum     Findings: Colonic mucosa of 10 cm segment of sigmoid colon   appears erythematous, with patchy petechial erythema, biopsies   were obtained.  Rest of the colonic mucosa appears normal. No   diverticuli were identified. Terminal ileum mucosa appears   normal. Biopsies obtained from terminal ileum, right and left   colon for histology.      Endoscopic Diagnosis: Petechial erythema at the sigmoid colon,   biopsied.   Rest of the colon appears normal.      Recommendations: Follow biopsies results.  Continue   antibiotics.  Follow up stool studies including clostridium   difficile testing.  Rest of recommendations per inpatient GI   team.      Electronically signed by By Marcelino DusterMichael Daisy Mcneel, M.D., PID: [id number]   on 01/04/2015 6:22:36 PM

## 2015-01-10 NOTE — Procedures (Signed)
FINAL PATHOLOGIC DIAGNOSIS:  D: Terminal ileum, biopsy       -Ileal mucosa with no diagnostic alteration.  B: Right colon, biopsy       -Colonic mucosa with no diagnostic alteration.  C: Sigmoid colon, biopsy       -Colonic mucosa with lamina propria fibrosis, mildly thickened       subepithelial collagen table, and surface epithelial damage having  apoptotic debris and rare neutrophil, see comment.  D: Rectum, biopsy       -Colonic mucosa with minimally thickened subepithelial collagen table  and mild superficial lymphoplasmacytosis, see comment.  COMMENT:  The histologic differential diagnosis includes drug injury,  topical/stercoral/mechanical damage, segmental colitis associated with  diverticular disease (SCAD), and resolving infection. Although there is a  mildly thickened subepithelial collagen layer, there are no increased  intraepithelial lymphocytes, and the findings are insufficient for a  diagnosis of microscopic colitis.  There are no features of chronic  colitis.  Although there is no ischemia in the current specimen, the  fibrosis could be secondary prior ischemic events. Clinical correlation is  recommended.    SPECIMEN(S) SUBMITTED:  A: terminal ileum biopsy  B: right colon biopsy  C: sigmoid colon biopsy  D: rectal biopsy    CLINICAL HISTORY:  49 year old female with past medical history significant for multiple  hospital stays for ischemic colitis, hypertension, diabetes mellitus type  II on insulin, presenting with abdominal pain, nausea, vomiting, diarrhea,  found with pancolitis. Here for colonoscopy.    GROSS DESCRIPTION:  A: The specimen (received in formalin, labeled with the patient's name,  medical record number and "TI bx") consists of four fragments of tan soft  tissue ranging from 0.2 to 0.4 cm in greatest dimension. The specimen is  entirely submitted in cassettes A1 to A2.  B: The specimen (received in formalin, labeled with the patient's name,  medical record number and "colon  right") consists of five fragments of tan  soft tissue ranging from 0.3 to 0.5 cm in greatest dimension. The specimen  is entirely submitted in cassette B1.  C: The specimen (received in formalin, labeled with the patient's name,  medical record number and "sigmoid bx") consists of three pink-tan  fragments of soft tissue ranging from 0.3 to 0.4 cm in greatest dimension.  The specimen is entirely submitted in cassette C1.  D: The specimen (received in formalin, labeled with the patient's name,  medical record number and "rectal bx") consists of four pink-tan fragments  of soft tissue ranging from 0.3 to 0.5 cm in greatest dimension. The  specimen is entirely submitted in cassette D1.  JH/jb  CONFIDENTIAL HEALTH INFORMATION: Health Care information is personal and  sensitive information. If it is being faxed to you it is done so under  appropriate authorization from the patient or under circumstances that do  not require patient authorization. You, the recipient, are obligated to  maintain it in a safe, secure and confidential manner. Re-disclosure  without additional patient consent or as permitted by law is prohibited.  Unauthorized re-disclosure or failure to maintain confidentiality could  subject you to penalties described in federal and state law.  If you have  received this report or facsimile in error, please notify the [facility] immediately and destroy the received document(s).    Material reviewed and Interpreted and  Report Electronically Signed by:  Renae FickleMark Valasek M.D., Ph.D. [id number])  Attending Surgical Pathologist  01/10/15 12:09  Electronic Signature derived from a single  controlled  access password

## 2015-01-22 ENCOUNTER — Other Ambulatory Visit: Payer: Self-pay | Admitting: Hospitalist

## 2015-01-22 DIAGNOSIS — K529 Noninfective gastroenteritis and colitis, unspecified: Principal | ICD-10-CM

## 2015-01-22 NOTE — Telephone Encounter (Addendum)
Unable to route message to Pt's PCP Len Childs.   Per Pt's EMR, last visit with [facility] in 2014 with Dr. Daryl Eastern.  Avett Reineck P. Marlou Porch Charity fundraiser

## 2015-01-22 NOTE — Telephone Encounter (Signed)
MyChart message sent to Pt regarding OV needed with her PCP (OR ? establish care with another doctor).  Raden Byington P. Marlou Porch Charity fundraiser

## 2015-01-23 NOTE — Telephone Encounter (Signed)
MyChart response received from Pt.   Nurse sent a MyChart response stating Pt's options- resident clinic, estab with a new LIM CP or the ER.  Katherine Church Charity fundraiser

## 2015-02-16 ENCOUNTER — Ambulatory Visit: Payer: Medicaid Other | Attending: Hospitalist | Admitting: Internal Medicine

## 2015-02-16 VITALS — BP 132/92 | HR 72 | Temp 98.3°F | Resp 16 | Ht 64.0 in | Wt 179.0 lb

## 2015-02-16 DIAGNOSIS — K529 Noninfective gastroenteritis and colitis, unspecified: Secondary | ICD-10-CM | POA: Insufficient documentation

## 2015-02-16 DIAGNOSIS — K559 Vascular disorder of intestine, unspecified: Principal | ICD-10-CM | POA: Insufficient documentation

## 2015-02-16 MED ORDER — DIAZEPAM 5 MG OR TABS: 5.00 mg | ORAL_TABLET | Freq: Four times a day (QID) | ORAL | Status: AC | PRN

## 2015-02-16 MED ORDER — PEG 3350-KCL-NABCB-NACL-NASULF 236 GM OR SOLR
4.00 L | Freq: Once | ORAL | 0 refills | Status: AC
Start: 2015-02-16 — End: 2015-02-16

## 2015-02-16 MED ORDER — SLEEP AID PO: ORAL | Status: AC

## 2015-02-16 NOTE — Patient Instructions (Signed)
-   Please schedule your colonoscopy with Dr. Allena KatzPatel today  - Please follow up in clinic in ~3 months

## 2015-02-16 NOTE — Progress Notes (Signed)
[facility]  Visit practitioner:  Dennison Mascot  Date / Time: 02/16/2015 7:27 AM  Referring Provider: Olean Ree     Reason for Visit: Colitis  Type of Visit: New patient    History of Present Illness:   Katherine Church is a 49 year old female with hx of DM, HTN on anti-hypertensives, anxiety and episode of ischemic colitis in 2011, and then repeated episodes in similar nature since (detailed below and biopsies without any signs of chronicity and looks like acute ischemic injury on each occasion). 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 Repeat colonic biopsies off nsaids still showed mucosal changes, more s/o ischemic injury. She had negative MRI/MRA for any large vessel disease.     More recently she was admitted in May for N/V, increased fatigue for about a month.She had gone to an OSH ED x2 and gotten CT x2 and was told she had diverticulitis and placed on abx, but came to [place] as she did not improve.Symptoms seemed similar to prior episodes. She was septic on presentation to the ED and given IVF and started on Unasyn. Initial lactate was 2.5, CRP elevated at 3.1. CT scan abdomen/pelvis had evidence of pan-colitis. GI was consulted and performed a colonoscopy on 01/04/15 that was only remarkable for a ~8cm patch of sigmoid erythema. There was no clear evidence of ulcerations or pseudomembranes. Biopsy results with lamina propria fibrosis, mildly thickened subepithelial collagen table, and surface epithelial damage having  apoptotic debris and rare neutrophil, most likely infection related vs. Ischemia (fibrosis could be 2/2 to prior ischemic event), no evidence of any chronicity. GI Pathogen panel neg, stool o&p neg and unasyn d/c'ed. D/c'ed with anti-emetics, dilaudid prn for pain control, and an increased dose of Bentyl for her abdominal cramps.    Now reports extreme fatigue and body aches.  Also with chronic RLQ pain for many years, also with RLQ spasms throughout the day exacerbated by foods, intermittent, takes bentyl for it (BID) and relieves the spasms. Has nausea 2x/wk, takes anti-emetics. Has reported 15 lbs wt gain/1 month 2/2 decrease in physical activity. Denies melena, but reports pinkish mucus sometimes. Does report very poor control of her DM with sugars in the high 200's in the morning, most recent A1c 9.5 in 01/2015.       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 She was advised to follow up with GI for a repeat colonoscopy in 3-5 years.        Myofascial pain 10/07/2010    Piriformis syndrome 10/07/2010    Screening for breast cancer 03/02/2010    Screening for malignant neoplasm of breast 09/14/2009    Acute ischemic colitis 09/08/2009    Malignant hypertension 05/22/2009    Retinal hemorrhage 05/22/2009    Diabetes 05/18/2009    Hypertensive disorder 05/18/2009    Chronic back pain 05/18/2009    Anxiety 05/18/2009       Past Medical History   Diagnosis Date    Back pain     Colitis, ulcerative ([place])     Diabetes mellitus ([place])  High blood pressure     Hyperlipidemia LDL goal < 100          Past Surgical History   Procedure Laterality Date    Pb anesth,tubal ligation/transection  1994    Colonoscopy  04/2011     Repeat surveillance colo in 2019    Colonoscopy procedure N/A 01/04/2015     Procedure: GI COLONOSCOPY;  Surgeon: Jessy Oto, MD;  Location: [facility];  Service: Gastroenterology;  Laterality: N/A;       Current Medications:    [DISCONTINUED] amLODIPINE (NORVASC) 10 MG tablet Take 1 tablet by mouth daily.   atorvastatin (LIPITOR) 20 MG tablet Take 1 tablet by mouth daily.   clonidine  (CATAPRES) 0.3 MG tablet Take 1 tablet (0.3 mg) by mouth every 8 hours.   dicyclomine (BENTYL) 10 MG capsule Take 2 capsules (20 mg) by mouth 3 times daily (before meals).   docusate sodium (COLACE) 250 MG capsule Take 1 capsule by mouth 2 times daily.   docusate sodium (COLACE) 250 MG capsule Take 1 capsule by mouth 2 times daily.   glucose blood test strip 1 strip by Other route 4 times daily (before meals and nightly).   hydrALAZINE (APRESOLINE) 25 MG tablet Take 1 tablet (25 mg) by mouth 3 times daily.   hydrochlorothiazide (HYDRODIURIL) 25 MG tablet Take 1 tablet (25 mg) by mouth daily.   HYDROmorphone (DILAUDID) 4 MG tablet Take 1 tablet (4 mg) by mouth every 4 hours as needed for Severe Pain (Pain Score 7-10).   insulin glargine (LANTUS) 100 UNIT/ML injection Inject 36 Units under the skin nightly.   insulin lispro (HUMALOG) 100 UNIT/ML injection Inject 12 Units under the skin 3 times daily (before meals).   lansoprazole (PREVACID SOLUTAB) 30 MG disintegrating tablet Take 1 tablet by mouth daily.   lisinopril (PRINIVIL, ZESTRIL) 40 MG tablet Take 1 tablet (40 mg) by mouth 2 times daily.   lisinopril (PRINIVIL, ZESTRIL) 40 MG tablet Take 1 tablet by mouth 2 times daily.   ondansetron (ZOFRAN ODT) 4 MG disintegrating tablet Take 1 tablet by mouth every 6 hours as needed for Nausea.   polyethylene glycol (MIRALAX) packet Take 1 packet by mouth daily.   prochlorperazine (COMPAZINE) 10 MG tablet Take 1 tablet (10 mg) by mouth every 6 hours as needed for Nausea/Vomiting.   senna (SENOKOT) 8.6 MG tablet Take 1 tablet by mouth daily.       Drug Allergies:  Allergies   Allergen Reactions    Ciprofloxacin Rash    Flagyl [Metronidazole Hcl] Rash    Sorbitol Unspecified    Steroids [Corticosteroids] Other     Blood pressure went very high    Tetanus Toxoids Swelling         Family History:  There is a family history of celiac disease.    - Maternal 2nd cousin with celiac disease   There is family history of IBD or  colon cancer.      - Maternal colon [place] in cousin at 83 y/o  There is no family history of liver disease.  There is no family history of autoimmune diseases.    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  Review of Systems:  Constitutional:  Weakness  yes, anorexia no, fever or chills  no  Eyes: Vision changes  no  Ears/Throat:  Hearing loss or changes, sore throat, or hoarseness  no  CV:  Palpitations, orthopnea, or chest pain  no  Respiratory:  Cough, shortness of breath, sputum, or wheezing  no  Genitourinary:  Dysuria, frequency, urgency, nocturia, or hematuria  no  GI:  See GI ROS above  Liver:  Jaundice  no  Musculoskeletal:  Arthalgia, joint pain, or back problems no  Neuro:  Syncope, seizure, or dizziness  no  Psych:  Depression  no, Anxiety  yes  Endocrine:  Diabetes yes, Thyroid no   Heme/Lymph:  Bruising or anemia  no    Physical examination:   LMP 06/02/2011 There is no height or weight on file to calculate BMI.  Wt Readings from Last 2 Encounters:   01/03/15 78.5 kg (173 lb)   12/01/13 72.6 kg (160 lb)    Blood Pressure   01/05/15 (!) 160/104   12/02/13 (!) 170/102      Pain Score:    General:  Well developed, well nourished and in no apparent distress.  Skin:  Non-icteric and no visible rash.  Head: Normal hair distribution.  Eyes: No scleral icterus  Respiratory:  Clear to auscultation bilaterally. Normal respiratory effort.  Cardiovascular:  Regular rate and rhythm  Abdomen:  Soft, RLQ tenderness, bowel sounds present. No bruits. No hepatosplenomegaly. No masses. No hernias.  Extremities:  No peripheral edema. Warm and well-perfused.  Neuro:  Alert oriented X3, Cranial nerves, sensation, and motor strength grossly intact.  Psych:  No anxiety  or depression. Normal affect.      Lab Results:  Pertinent labs and other test results were reviewed with the patient and are as in the HPI and/or displayed below.  Lab Results   Component Value Date    WBC 8.6 01/05/2015    RBC 4.44 01/05/2015    HGB 12.6 01/05/2015    HCT 36.6 01/05/2015    MCV 82.4 01/05/2015    MCHC 34.4 01/05/2015    RDW 13.2 01/05/2015    PLT 197 01/05/2015    PLT 219 05/21/2009    MPV 9.5 01/05/2015     Lab Results   Component Value Date    BUN <2 01/05/2015    CREAT 0.49 01/05/2015    CL 99 01/05/2015    NA 138 01/05/2015    K 3.3 01/05/2015    [place] 8.9 01/05/2015    TBILI 0.62 01/03/2015    ALB 4.6 01/03/2015    TP 8.7 01/03/2015    AST 19 01/03/2015    ALK 113 01/03/2015    BICARB 23 01/05/2015    ALT 37 01/03/2015    GLU 237 01/05/2015       Endoscopy:  Colonoscopy 01/04/15: FINDINGS  Colonic mucosa of 10 cm segment of sigmoid colon appears erythematous, with patchy petechial erythema, biopsies were obtained. Rest of the colonic mucosa appears normal. No diverticuli were identified. Terminal ileum mucosa appears normal. Biopsies obtained from terminal ileum, right and left colon for histology.  ENDOSCOPIC DIAGNOSIS  Petechial erythema at the sigmoid colon, biopsied.   Rest of the colon appears normal.    FINAL PATHOLOGIC DIAGNOSIS:  D: Terminal ileum, biopsy  -Ileal mucosa with no diagnostic alteration.  B: Right colon, biopsy  -Colonic mucosa with no diagnostic alteration.  C: Sigmoid colon, biopsy  -Colonic mucosa with lamina propria fibrosis, mildly thickened  subepithelial  collagen table, and surface epithelial damage having  apoptotic debris and rare neutrophil, see comment.  D: Rectum, biopsy  -Colonic mucosa with minimally thickened subepithelial collagen table  and mild superficial lymphoplasmacytosis, see comment.  COMMENT: The histologic differential diagnosis includes drug injury,  topical/stercoral/mechanical damage, segmental colitis associated with  diverticular disease  (SCAD), and resolving infection. Although there is a  mildly thickened subepithelial collagen layer, there are no increased  intraepithelial lymphocytes, and the findings are insufficient for a  diagnosis of microscopic colitis. There are no features of chronic  colitis. Although there is no ischemia in the current specimen, the  fibrosis could be secondary prior ischemic events. Clinical correlation is  recommended.       Colonoscopy 05/02/2011:  Indication: 49 yo woman with h/o  abdominal pain and bright red blood per rectum. She has  h/o ischemic colitis diagnosed in 2011.  Findings: 1- Normal Ascending colon except for nonspecific  patchy erythema.. Colonoscope could not be passed beyond  ascending colon secondary to stools. Biopsies were taken  (jar 1) from erythema in Right colon.  2- Normal transverse colon.  3- Descending colon was erythematous and punctate  erythema, w/ scant washable bloody mucosa, was seen from  splenic flexure till rectosigmoid junction. Biopsies were  taken (35-30cm) (jar 2), .  4- Distal Sigmoid/rectum. There was small amount of old  blood present in rectum. Rare erythema. vascular pattern  overall intact. Biopsies taken from rectosigmoid (jar 3)    Endoscopic Diagnosis: Predominantly sigmoid inflammation  consistent with colon ischemia in setting of history, and  previously elevated lactate. There was no stricture or  ulceration seen. Follow up biopsies to help exclude less  likely Crohn's colitis.    Recommendations: -- Will work up for coagulopathy like  antiphospholipid syndrome, Protein C/S deficiency.  - Consider Vascular Consult, given repeated episodes of  likely ischemic colitis without known etiology.  -- Avoid NSAIDS  -- Avoid hypotension  - analgesia, keep well hydrate IV fluids.  -- Recommend checking C diff, treat if positive.  - Start clear liquid diet.    FINAL PATHOLOGIC DIAGNOSIS:  A: Colon, right, biopsy  -Focal minimal acute colitis, see comment.  B: Colon, 30 to  35 cm, biopsy  -Benign colonic mucosa with focal minimal acute colitis and lamina  propria fibrosis, see comment.  C: Rectosigmoid colon, biopsy  -Focal mild acute colitis, see comment.  COMMENT: For parts A, B, and C, there is focal acute cryptitis without  evidence of architectural distortion. The differential diagnosis includes  an acute infectious colitis, ischemic colitis, drug injury including  NSAIDs, early changes of inflammatory bowel disease, and bowel preparation  injury. For part B, there are foci of lamina propria fibrosis with focal  withered appearing glands. The findings are suggestive of ischemia. These  superficial mucosal biopsies do not show larger vessels to evaluate for  vasculitis or thrombosis. Please correlate clinically.      Colonoscopy 04/16/2011:  Indication: 49 year old female  with an episode of ischemic colitis in 2011, presenting with  RLQ abdominal pain, nausea and what may be melena vs  hematochezia. History of severe hearburn, n/v with dark  stool in this patient with NSAID use. Had unremarkable  EGD in 8/30 with negative biopsies. Also incomplete  colonoscopy in 8/30 (poor prep) but w/o significant findings  and negative biopsies. Had essentially non-diagnostic MR-  E this week. Here for repeat colonoscopy..   Endoscopic Diagnosis: - There were few, focal superficial  small ulcers in the rectum, rectosigmoid junction, distal  sigmoid colon and splenic flexure with normal appearing  mucosa in-between these segments; multiple biopsies are  taken.  - 2 mm sessile polyp in the recto-sigmoid junction; removed  by cold excisional biopsy.  - Otherwise normal colon to the terminal ileum. Multiple  random biopsies are taken from terminal ileum, right colon  and left colon and placed in separate jars to evaluate for  the underlying chronic inflammation.    Recommendations: - Follow-up biopsy results. These could  be due to nsaids or ischemic.  - Inpatient GI team will follow.    FINAL  PATHOLOGIC DIAGNOSIS:  A: Terminal ileum, biopsy  -Benign small intestinal mucosa with no specific pathologic  abnormality.  B: Right colon, biopsy  -Benign colonic mucosa with mild architectural alterations.  C: Splenic flexure of colon, lesions, biopsy  -Benign colonic mucosa with mild architectural alterations.  D: Random left colon, biopsy  -Benign colonic mucosa with no significant pathologic abnormality.  E: Rectosigmoid lesions, biopsy  -Benign colonic mucosa with focal mild lamina propria fibrosis.  F: Rectosigmoid polyps, biopsy  -Tubular adenoma.  G: Rectal lesion, biopsy  -Focal ulceration with reparative changes.      EGD 04/10/2011:  Endoscopic Diagnosis: - Normal esophagus except GE  junction that had erythema and slight nodularity s/p biopsies  (likely from N/V)  - Slight gastric erythema but no ulcers, no old or fresh blood  seen. Biopsies taken  - Normal appearing duodenum, s/p biopsies  Recommendations: Follow up biopsy results. Other  recommendations per GI inpatient consultation note and  see colonoscopy report.    Colonoscopy 04/10/2011:  Indication 49 year old female  with hx of DM, HTN and episode of ischemic colitis in 2011,  presenting with RLQ abdominal pain, nausea and what may  be melena vs hematochezia. History of severe hearburn,  n/v with dark stool in this patient with NSAID use. Can not  take prep so undergoing EGD/Flex sig today. H/H stable.  Endoscopic Diagnosis: - Enema prep.  - GIF H180 scope advanced to appearances of cecum,  can not confirm as solid impacted stool can not be cleared.  - Normal appearing mucosa in left colon mostly (although  stool covering mucosa, once cleared with irrigation,  appeared healthy underneath) except splenic flexture area  (35 cm-40 cm proximal to anal verge) where there is  erythema, also diverticula observed in this area. S/P  biopsies.  - Slight rectal erythema, s/p biopsies  - Right colon mucosa appears healthy (see above for extent  of exam),  s/p biopsies  - Retroflexion not done as can not hold in air    Recommendations: Follow biopsy results, other  recommendations per in-patient GI consultation note.  Should get complete colonoscopy given small polyp  appearances in left colon, unsatisfactory prep for  colonsocopy today and RLQ pain, this can only be done  once patient can tolerate preparation (in or out patient okay)    FINAL PATHOLOGIC DIAGNOSIS:  A: Stomach, biopsy  -Gastric mucosa with no significant histopathology.  -No Helicobacter organisms identified.  B: Small bowel, biopsy  -Small intestinal mucosa with no significant histopathology.  C: Gastroesophageal junction, biopsy  -Gastroesophageal junction with foveolar hyperplasia and reactive  changes of gastric cardia.  D: Left colon, random, biopsy  -Colonic mucosa with minimal lamina propria fibrosis and no other  significant histopathology.  E: Colon, right, biopsy  -Colonic mucosa with no significant histopathology.  F: Rectum, biopsy  -Rectal mucosa with  minimal lamina propria fibrosis and no other  significant histopathology.      Colonoscopy 09/03/09:  INDICATIONS FOR EXAMINATION: 49 yo female with DM,  obesity, HTN, who p/w left sided abdominal pain, bloody  diarrhea. Taking alot advils for eye pain. CT abdomen  showed findings in left colon concerning for ischemic colitis  vs infection  Endoscopic Diagnosis: Rectal exam: no mass  Fair prep. Scope was advanced to distal transverse colon  only given extensive areas of ischemic colitis and risk of  perforation. Patchy areas of ulcerated colonic mucosa with  inflammation and edema in the sigmoid colon and splenic  flexure, which became confluent areas of severely  ulcerated, inflammed, edematous colonic mucosa with  multiple dusky areas in the descending colon, concerning  for patchy necrosis. Cannot rule out dead bowel. Multiple  biopsies taken from these areas. Rectum and distal  transverse colon appeared relatively spared.  Retroflexion  not performed given patient's discomfort.    Recommendations: 1) Surgery team was contacted  regarding the colonoscopy findings during the case.  2) Continue NPO, IV fluid, antibiotics, pain control.  However, given the colonoscopy findings, will defer to  surgery team regarding surgical intervention.  3) Follow up biopsy results    FINAL PATHOLOGIC DIAGNOSIS:  A: Colon, descending, biopsy    -Mucosal necrosis with fibrinopurulent exudate, see comment.  COMMENT: The specimen shows extensive mucosal necrosis with occasional  crypt bases remaining viable. There is focal fibrinopurulent exudate on  the surface, consistent with ulceration or possibly a pseudomembrane.  Intramucosal hemorrhage and thrombi are identified. The differential  diagnosis includes ischemic colitis versus C. difficile colitis. Clinical  correlation is needed.      CT Abdomen/Pelvis w/ IV Contrast 01/04/15:  CT ABDOMEN  LUNG BASES: Unremarkable  LIVER: Fatty liver. Hepatomegaly, measuring 20.6 cm.  BILIARY: Unremarkable  SPLEEN: Unremarkable  PANCREAS: Atrophic  ADRENAL GLANDS: Unremarkable  KIDNEYS: Unremarkable  STOMACH/DUODENUM: Thickening of the distal esophagus is noted.  VASCULATURE: Unremarkable  LYMPHATIC: A 1 cm peripancreatic lymph node is again seen, unchanged.  CT PELVIS  SMALL & LARGE BOWEL: Nearly all of the colon demonstrates wall thickening withmural stratification and with minimal adjacent stranding. Normal appendix.  BLADDER: Unremarkable  PELVIC ORGANS: Unremarkable  BONES: Degenerative changes noted along the spine. Diffuse osteopenia noted.  SOFT TISSUES: Unremarkable    MRA 06/2011: IMPRESSION:  1. Patent aorta and mesenteric vessels. No evidence of occlusion, narrowing,   or aneurysm or thickening.  2. No acute findings.      Impression and Plan:   Katherine Church is a 49 year old female with hx of DM, HTN on anti-hypertensives, anxiety and episode of ischemic colitis in 2011, and then repeated  episodes in similar nature since (detailed below and biopsies without any signs of chronicity and looks like acute ischemic injury on each occasion). 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 Repeat colonic biopsies off nsaids still showed mucosal changes, more s/o ischemic injury. She had negative MRI/MRA for any large vessel disease.   More recently she was admitted in May for N/V, increased fatigue for about a month, was found to be hypotensive with elevated inflammatory markers and lactate, CT with pan-colitis, colonoscopy with ~8cm patch of sigmoid erythema. Biopsy results with lamina propria fibrosis, mildly thickened subepithelial collagen table, and surface epithelial damage having apoptotic debris and rare neutrophil, most likely infection related vs. Ischemia (fibrosis could  be 2/2 to prior ischemic event), no evidence of any chronicity. GI Pathogen panel neg, stool o&p neg and unasyn d/c'ed.   Now presents to GI clinic for follow-up. Reports the same chronic RLQ pain that she has had ongoing for years, bentyl does help it.  Bowel habits normal without melena but reports pinkish mucus sometimes. Does have a major complaint of fatigue in the setting of very poor control of her DM with sugars in the high 200's in the morning, most recent A1c 9.5 in 01/2015.   At this time, it does not appear that she has ever had a colonoscopy in the absence of symptoms. Will pursue this to document a baseline colonoscopy and see if there are any mucosal changes in the absence of any symptoms or acute ischemic colitis episodes.     Recommendations:  - Colonoscopy to be scheduled today, GoLytely ordered  - Continue to abstain from NSAIDs   - Avoid hypotension given on multiple anti-hypertensives  - Stay well hydrated      RTC in 3 months after colonoscopy   DWA Dr. Posey Pronto    Electronically Signed by:  Dennison Mascot  Division of  Gastroenterology   02/16/2015 7:27 AM

## 2015-02-26 NOTE — Progress Notes (Signed)
Attending Note:    Patient seen and discussed with GI Fellow, Dr. Camille BalVeerappan.    Subjective:  I reviewed the History.  Patient interviewed and examined.  History of present illness (HPI):  Consultation requested by Dr. Renaldo ReelHuang for evaluation of "colitis" in this 48yo woman with hx DM, HTN, and multiple admissions (most recent in 12/2014) for abdominal pain resulting in colonoscopies which were endoscopically and histologically c/w ischemic colitis. Has chronic intermittent RLQ pain which improves with bentyl.      Review of Systems (ROS): As per Fellow's note.  Past Medical, Family, Social History:  As per Fellow's note.    Objective:    I have examined the patient and I agree with Fellow's findings. I reviewed the labs, imaging studies and previous GI w/u.     Assessment and Plan:  48yo woman with DM, multiple admissions for ischemic colitis with extensive previous w/u which has excluded infection, IBD. Since all previous colonoscopies were performed during acute episodes, will repeat colonoscopy while asymptomatic to further demonstrate lack of chronic colitis. Counseled re ischemic colitis, importance of avoiding intravascular hypovolemia.   Assessment and Plan reviewed with GI Fellow.  I agree with the Fellow's plan as documented.    See the GI Fellow's note for further details.

## 2015-02-27 ENCOUNTER — Encounter (HOSPITAL_BASED_OUTPATIENT_CLINIC_OR_DEPARTMENT_OTHER): Admission: RE | Disposition: A | Discharge status: Home Health | Payer: Self-pay | Attending: Internal Medicine

## 2015-02-27 ENCOUNTER — Encounter (HOSPITAL_BASED_OUTPATIENT_CLINIC_OR_DEPARTMENT_OTHER): Payer: Self-pay

## 2015-02-27 ENCOUNTER — Ambulatory Visit
Admission: RE | Admit: 2015-02-27 | Discharge: 2015-02-27 | Disposition: A | Discharge status: Home / Self Care | Payer: Medicaid Other | Attending: Internal Medicine | Admitting: Internal Medicine

## 2015-02-27 DIAGNOSIS — K599 Functional intestinal disorder, unspecified: Secondary | ICD-10-CM

## 2015-02-27 DIAGNOSIS — K529 Noninfective gastroenteritis and colitis, unspecified: Principal | ICD-10-CM | POA: Insufficient documentation

## 2015-02-27 DIAGNOSIS — K5730 Diverticulosis of large intestine without perforation or abscess without bleeding: Secondary | ICD-10-CM | POA: Insufficient documentation

## 2015-02-27 DIAGNOSIS — K559 Vascular disorder of intestine, unspecified: Secondary | ICD-10-CM

## 2015-02-27 DIAGNOSIS — E785 Hyperlipidemia, unspecified: Secondary | ICD-10-CM | POA: Insufficient documentation

## 2015-02-27 DIAGNOSIS — M549 Dorsalgia, unspecified: Secondary | ICD-10-CM | POA: Insufficient documentation

## 2015-02-27 DIAGNOSIS — Z8719 Personal history of other diseases of the digestive system: Secondary | ICD-10-CM

## 2015-02-27 DIAGNOSIS — E119 Type 2 diabetes mellitus without complications: Secondary | ICD-10-CM | POA: Insufficient documentation

## 2015-02-27 DIAGNOSIS — R109 Unspecified abdominal pain: Secondary | ICD-10-CM

## 2015-02-27 HISTORY — PX: COLONOSCOPY PROCEDURE: GI45378

## 2015-02-27 LAB — GLUCOSE (POCT): Glucose (POCT): 286 mg/dL — ABNORMAL HIGH (ref 70–99)

## 2015-02-27 SURGERY — COLONOSCOPY
Anesthesia: Moderate Sedation - by non-anesthesia staff only

## 2015-02-27 MED ORDER — MEPERIDINE HCL 100 MG/2ML IJ SOLN
INTRAMUSCULAR | Status: DC | PRN
Start: 2015-02-27 — End: 2015-02-27
  Administered 2015-02-27: 25 mg via INTRAVENOUS
  Administered 2015-02-27: 50 mg via INTRAVENOUS
  Administered 2015-02-27: 25 mg via INTRAVENOUS

## 2015-02-27 MED ORDER — SIMETHICONE IN STERILE WATER IRRIGATION 2.01 MG/ML (OPTIME)
Status: DC | PRN
Start: 2015-02-27 — End: 2015-02-27
  Administered 2015-02-27: 1000 mL

## 2015-02-27 MED ORDER — DIPHENHYDRAMINE HCL 50 MG/ML IJ SOLN
INTRAMUSCULAR | Status: DC | PRN
Start: 2015-02-27 — End: 2015-02-27
  Administered 2015-02-27: 50 mg via INTRAVENOUS

## 2015-02-27 MED ORDER — SODIUM CHLORIDE 0.9 % IV SOLN
INTRAVENOUS | Status: DC | PRN
Start: 2015-02-27 — End: 2015-02-27
  Administered 2015-02-27: 100 mL/h via INTRAVENOUS

## 2015-02-27 MED ORDER — MIDAZOLAM HCL 5 MG/5ML IJ SOLN
INTRAMUSCULAR | Status: DC | PRN
Start: 2015-02-27 — End: 2015-02-27
  Administered 2015-02-27 (×5): 2 mg via INTRAVENOUS

## 2015-02-27 SURGICAL SUPPLY — 1 items: BIOPSY FORCEP RADIAL JAW 4 2.8MM X 240CM, LARGE W/NEEDLE (Misc Medical Supply) ×2 IMPLANT

## 2015-02-27 NOTE — H&P (Signed)
History and Physical    Indication for procedure:  49yo woman with recurrent episodes of ischemic colitis, chronic abd pain.               Past Medical History   Diagnosis Date    Back pain     Colitis, ulcerative (HCC)     Diabetes mellitus (HCC)     High blood pressure     Hyperlipidemia LDL goal < 100      Past Surgical History   Procedure Laterality Date    Pb anesth,tubal ligation/transection  1994    Colonoscopy  04/2011     Repeat surveillance colo in 2019    Colonoscopy procedure N/A 01/04/2015     Procedure: GI COLONOSCOPY;  Surgeon: Murvin Natal, MD;  Location: [facility];  Service: Gastroenterology;  Laterality: N/A;     Allergies   Allergen Reactions    Ciprofloxacin Rash    Flagyl [Metronidazole Hcl] Rash    Sorbitol Unspecified    Steroids [Corticosteroids] Other     Blood pressure went very high    Tetanus Toxoids Swelling     Prior to Admission Medications   Prescriptions Last Dose Informant Patient Reported? Taking?   Doxylamine Succinate, Sleep, (SLEEP AID PO)   Yes No   HYDROmorphone (DILAUDID) 4 MG tablet   No No   Sig: Take 1 tablet (4 mg) by mouth every 4 hours as needed for Severe Pain (Pain Score 7-10).   atorvastatin (LIPITOR) 20 MG tablet Unknown at Unknown time  No No   Sig: Take 1 tablet by mouth daily.   clonidine (CATAPRES) 0.3 MG tablet 02/27/2015 at Unknown time  No Yes   Sig: Take 1 tablet (0.3 mg) by mouth every 8 hours.   diazepam (VALIUM) 5 MG tablet   Yes No   Sig: Take 5 mg by mouth every 6 hours as needed for Anxiety.   dicyclomine (BENTYL) 10 MG capsule   No No   Sig: Take 2 capsules (20 mg) by mouth 3 times daily (before meals).   docusate sodium (COLACE) 250 MG capsule   No No   Sig: Take 1 capsule by mouth 2 times daily.   docusate sodium (COLACE) 250 MG capsule   No No   Sig: Take 1 capsule by mouth 2 times daily.   glucose blood test strip   No No   Sig: 1 strip by Other route 4 times daily (before meals and nightly).   hydrALAZINE (APRESOLINE)  25 MG tablet   No No   Sig: Take 1 tablet (25 mg) by mouth 3 times daily.   hydrochlorothiazide (HYDRODIURIL) 25 MG tablet   No No   Sig: Take 1 tablet (25 mg) by mouth daily.   insulin glargine (LANTUS) 100 UNIT/ML injection   No No   Sig: Inject 36 Units under the skin nightly.   insulin lispro (HUMALOG) 100 UNIT/ML injection 02/26/2015 at Unknown time  No Yes   Sig: Inject 12 Units under the skin 3 times daily (before meals).   lansoprazole (PREVACID SOLUTAB) 30 MG disintegrating tablet   No No   Sig: Take 1 tablet by mouth daily.   lisinopril (PRINIVIL, ZESTRIL) 40 MG tablet 02/26/2015 at Unknown time  No Yes   Sig: Take 1 tablet by mouth 2 times daily.   lisinopril (PRINIVIL, ZESTRIL) 40 MG tablet 02/26/2015 at Unknown time  No Yes   Sig: Take 1 tablet (40 mg) by mouth 2 times daily.  ondansetron (ZOFRAN ODT) 4 MG disintegrating tablet   No No   Sig: Take 1 tablet by mouth every 6 hours as needed for Nausea.   polyethylene glycol (MIRALAX) packet   No No   Sig: Take 1 packet by mouth daily.   prochlorperazine (COMPAZINE) 10 MG tablet   No No   Sig: Take 1 tablet (10 mg) by mouth every 6 hours as needed for Nausea/Vomiting.   senna (SENOKOT) 8.6 MG tablet   No No   Sig: Take 1 tablet by mouth daily.      Facility-Administered Medications: None       BP 107/79  Pulse 68  Temp 97.4 F (36.3 C)  Resp 18  Ht 5\' 4"  (1.626 m)  Wt 78 kg (172 lb)  LMP 06/02/2011  SpO2 96%  BMI 29.52 kg/m2  General:   Normal  Lungs:   Normal  CV:    Normal  Abdomen:   Normal    ASA Score:  2   Airway (Mallimpati) Score:  Class II - Soft palate, uvula, and fauces are visible.    Assessment and Plan  Proceed to planned procedure.    The patient has consented to the procedure, which will be done with sedation.  I have assessed the patient's status immediately prior to this procedure.  I have discussed pain management needs and options for the patient with the patient or caregiver.      The patient agrees to be full code for the duration of  the procedure.    Sedation options, risks, and plans have been discussed with the patient or caregiver.  Questions were answered.  The patient or caregiver agrees to proceed as planned.    Katherine Church [employer] Kalsey Lull

## 2015-02-27 NOTE — Discharge Instructions (Signed)
[facility] discharge instructions reviewed with patient prior to leaving the hospital. The patient verbalizes understanding.

## 2015-02-27 NOTE — Procedures (Signed)
Patient Name: Katherine Church   MR#: [id number]   Date/Time ofProcedure: 02/27/2015 09:00 AM   Endoscopist: Johneisha Broaden     GI Fellow: None     Referring Physician:      PROCEDURE PERFORMED: COLONOSCOPY  -  Biopsy     INDICATIONS FOR EXAMINATION: Hx recurrent ischemic colitis,   chronic abd pain     INSTRUMENTS: 478     MEDICATIONS: Benadryl 50 mg IVP, Demerol 100 mg IVP, Versed 10   mg IVP   NEED FOR ANESTHESIA:YES    The attending physician, Dr.Rashanna Christiana, was present for the   entire examination.     PROCEDURE TECHNIQUE: A physical exam was performed. Informed   consent was obtained from the patient after explaining all the   risks (perforation, bleeding, infection, missed lesion(s), and   adverse effects to the medicine) , benefits and alternatives to   the procedure which the patient appeared to understand and so   stated.  The patient was connected to the monitoring devices and   placed in the left lateral position. Continuous oxygen was   provided with a nasal cannula and IV medicine administered   through an indwelling cannula. After adequate moderate sedation   was achieved, a digital exam was performed and the colonoscope   was introduced into the rectum and advanced under direct   visualization to the extent of exam. The scope was subsequently   removed slowly while carefully examining the color, texture,   anatomy, and integrity of the mucosa on the way out. In the   rectum, the scope was retroflexed to evaluate for internal   hemorrhoids and anorectal pathology. The patient was   subsequently transferred to the recovery area in satisfactory   condition.      COMPLICATIONS: None   ESTIMATED BLOOD LOSS: None   BIOPSY TAKEN: Yes   BOWEL PREP QUALITY: good   EXTENT OF EXAM: terminal ileum     Findings: Good prep to cecum.  There is mild erythema and edema   of the colonic folds in the sigmoid and descending colon, but no   colitis or ulceration.  Mild sigmoid diverticulosis.  Otherwise   normal colon and TI.   Biopsies taken every 10cm throughout colon.     Endoscopic Diagnosis: Good prep to cecum.  There is mild   erythema and edema of the colonic folds in the sigmoid and   descending colon, but no colitis or ulceration.  Mild sigmoid   diverticulosis.  Otherwise normal colon and TI.  Biopsies taken   every 10cm throughout colon.     Recommendations: There is no obvious colitis or diverticulitis   on your exam today.  Await pathology results.  Follow up in   [place] with Dr. Camille BalVeerappan.  You may see a small amount of blood   with your next bowel movement due to the biopsies taken today.      Electronically signed by By Elvis CoilEREK Kateena Degroote, M.D., PID: [id number]   on 02/27/2015 9:34:25 AM

## 2015-03-02 ENCOUNTER — Encounter (HOSPITAL_BASED_OUTPATIENT_CLINIC_OR_DEPARTMENT_OTHER): Payer: Self-pay | Admitting: Internal Medicine

## 2015-03-02 NOTE — Procedures (Signed)
FINAL PATHOLOGIC DIAGNOSIS:  A: Cecum, biopsy       -Colonic mucosa with no diagnostic alteration, see comment.  B: Colon at 90 cm, biopsy       -Colonic mucosa with no diagnostic alteration.  C: Colon at 80 cm, biopsy       -Colonic mucosa with no diagnostic alteration.  D: Colon at 70 cm, biopsy       -Colonic mucosa with no diagnostic alteration.  E: Colon at 60 cm, biopsy       -Colonic mucosa with no diagnostic alteration.  F: Colon at 50 cm, biopsy       -Colonic mucosa with no diagnostic alteration.  G: Colon at 40 cm, biopsy       -Colonic mucosa with no diagnostic alteration.  H: Colon at 30 cm, biopsy       -Colonic mucosa with no diagnostic alteration.  I: Colon at 20 cm, biopsy       -Colonic mucosa with no diagnostic alteration.  J: Colon at 10 cm, biopsy       -Colonic mucosa with no diagnostic alteration.  COMMENT: There is no active, chronic, or microscopic colitis identified in  any of the above biopsies. No ischemia seen.    SPECIMEN(S) SUBMITTED:  A:   Cecum biopsy  B:   Colon biopsy 90 cm  C:   Colon biopsy 80 cm  D:   Colon biopsy 70 cm  E:   Colon biopsy 60 cm  F:   Colon biopsy 50 cm  G:   Colon biopsy 40 cm  H:   Colon biopsy 30 cm  I:   Colon biopsy 20 cm  J:   Colon biopsy 10 cm    CLINICAL HISTORY:  ICD 10 Code: K39.88.  49 year old woman with recurrent episodes of ischemic  colitis, chronic abdominal pain.  A-J: Evaluate for chronic/active colitis, ischemic changes.  Note:  Colonoscopy to be obtained when patient is not having an acute episode to  determine whether at baseline she has a normal colon, also with occasional  red mucus in her stools.;    GROSS DESCRIPTION:  A: Received in formalin labeled "A-cec" consists of two fragments of tan  soft tissue measuring 0.3 x 0.2 x 0.1 cm and 0.4 x 0.3 x 0.1 cm.  The  specimen is entirely submitted in cassette A1.  B: Received in formalin labeled "B-90" consists of two fragments of tan  soft tissue measuring 0.3 x 0.3 x 0.1 cm and 0.4 x 0.3 x  0.1 cm.  The  specimen is entirely submitted in cassette B1.  C: Received in formalin labeled "C-80" consists of two fragments of tan  soft tissue both measuring 0.3 x 0.3 x 0.1 cm.  The specimen is entirely  submitted in cassette C1.  D: Received in formalin labeled "D-70" consists of two fragments of tan  soft tissue measuring 0.2 x 0.2 x 0.1 cm and 0.6 x 0.4 x 0.2 cm.  The  specimen is entirely submitted in cassettes D1-D2.  E: Received in formalin labeled "E-60" consists of two fragments of tan  soft tissue both measuring 0.3 x 0.3 x 0.1 cm.  The specimen is entirely  submitted in cassette E1.  F: Received in formalin labeled "F-50" consists of two fragments of tan  soft tissue measuring 0.6 x 0.3 x 0.2 cm and 0.3 x 0.2 x 0.2 cm.  The  specimen is entirely submitted in cassette F1.  G: Received in  formalin labeled "G-40" consists of two fragments of tan  soft tissue measuring 0.4 x 0.4 x 0.1 cm and 0.3 x 0.3 x 0.1 cm.  The  specimen is entirely submitted in cassette G1.  H: Received in formalin labeled "H-30" consists of two fragments of tan  soft tissue measuring 0.3 x 0.3 x 0.1 cm and 0.4 x 0.2 x 0.1 cm.  The  specimen is entirely submitted in cassette H1.  I: Received in formalin labeled "I-20" consists of two fragments of tan  soft tissue both measuring 0.3 x 0.3 x 0.1 cm.  The specimen is entirely  submitted in cassette I1.  J: Received in formalin labeled "J-10" consists of two fragments of tan  soft tissue measuring 0.3 x 0.2 x 0.1 cm and 0.5 x 0.3 x 0.1 cm.  The  specimen is entirely submitted in cassette J1.  JH/dlp  CONFIDENTIAL HEALTH INFORMATION: Health Care information is personal and  sensitive information. If it is being faxed to you it is done so under  appropriate authorization from the patient or under circumstances that do  not require patient authorization. You, the recipient, are obligated to  maintain it in a safe, secure and confidential manner. Re-disclosure  without additional patient  consent or as permitted by law is prohibited.  Unauthorized re-disclosure or failure to maintain confidentiality could  subject you to penalties described in federal and state law.  If you have  received this report or facsimile in error, please notify the [facility] immediately and destroy the received document(s).    Material reviewed and Interpreted and  Report Electronically Signed by:  Renae Fickle M.D., Ph.D. [phone])  Attending Surgical Pathologist  03/02/15 11:00  Electronic Signature derived from a single  controlled access password

## 2015-04-06 ENCOUNTER — Encounter (HOSPITAL_BASED_OUTPATIENT_CLINIC_OR_DEPARTMENT_OTHER): Payer: Self-pay | Admitting: Nurse Practitioner

## 2015-04-09 ENCOUNTER — Telehealth (HOSPITAL_BASED_OUTPATIENT_CLINIC_OR_DEPARTMENT_OTHER): Payer: Self-pay

## 2015-04-09 NOTE — Telephone Encounter (Signed)
Called patient and left a message on voicemail to please call back. Her appointment needs to be rescheduled from Oct 14 to Oct 21. Dr. Veerappan changed her vacation dates.

## 2015-05-11 ENCOUNTER — Ambulatory Visit (HOSPITAL_BASED_OUTPATIENT_CLINIC_OR_DEPARTMENT_OTHER): Payer: Medicaid Other | Admitting: Internal Medicine

## 2015-05-25 ENCOUNTER — Ambulatory Visit (HOSPITAL_BASED_OUTPATIENT_CLINIC_OR_DEPARTMENT_OTHER): Payer: Medicaid Other | Admitting: Internal Medicine

## 2023-11-30 IMAGING — MG MAMMO DIAG LT
2 series · 2 of 2 positions shown · non-contrast
Comparison: 09/30/2021

INDICATION: Left breast calcifications.

[L LM]
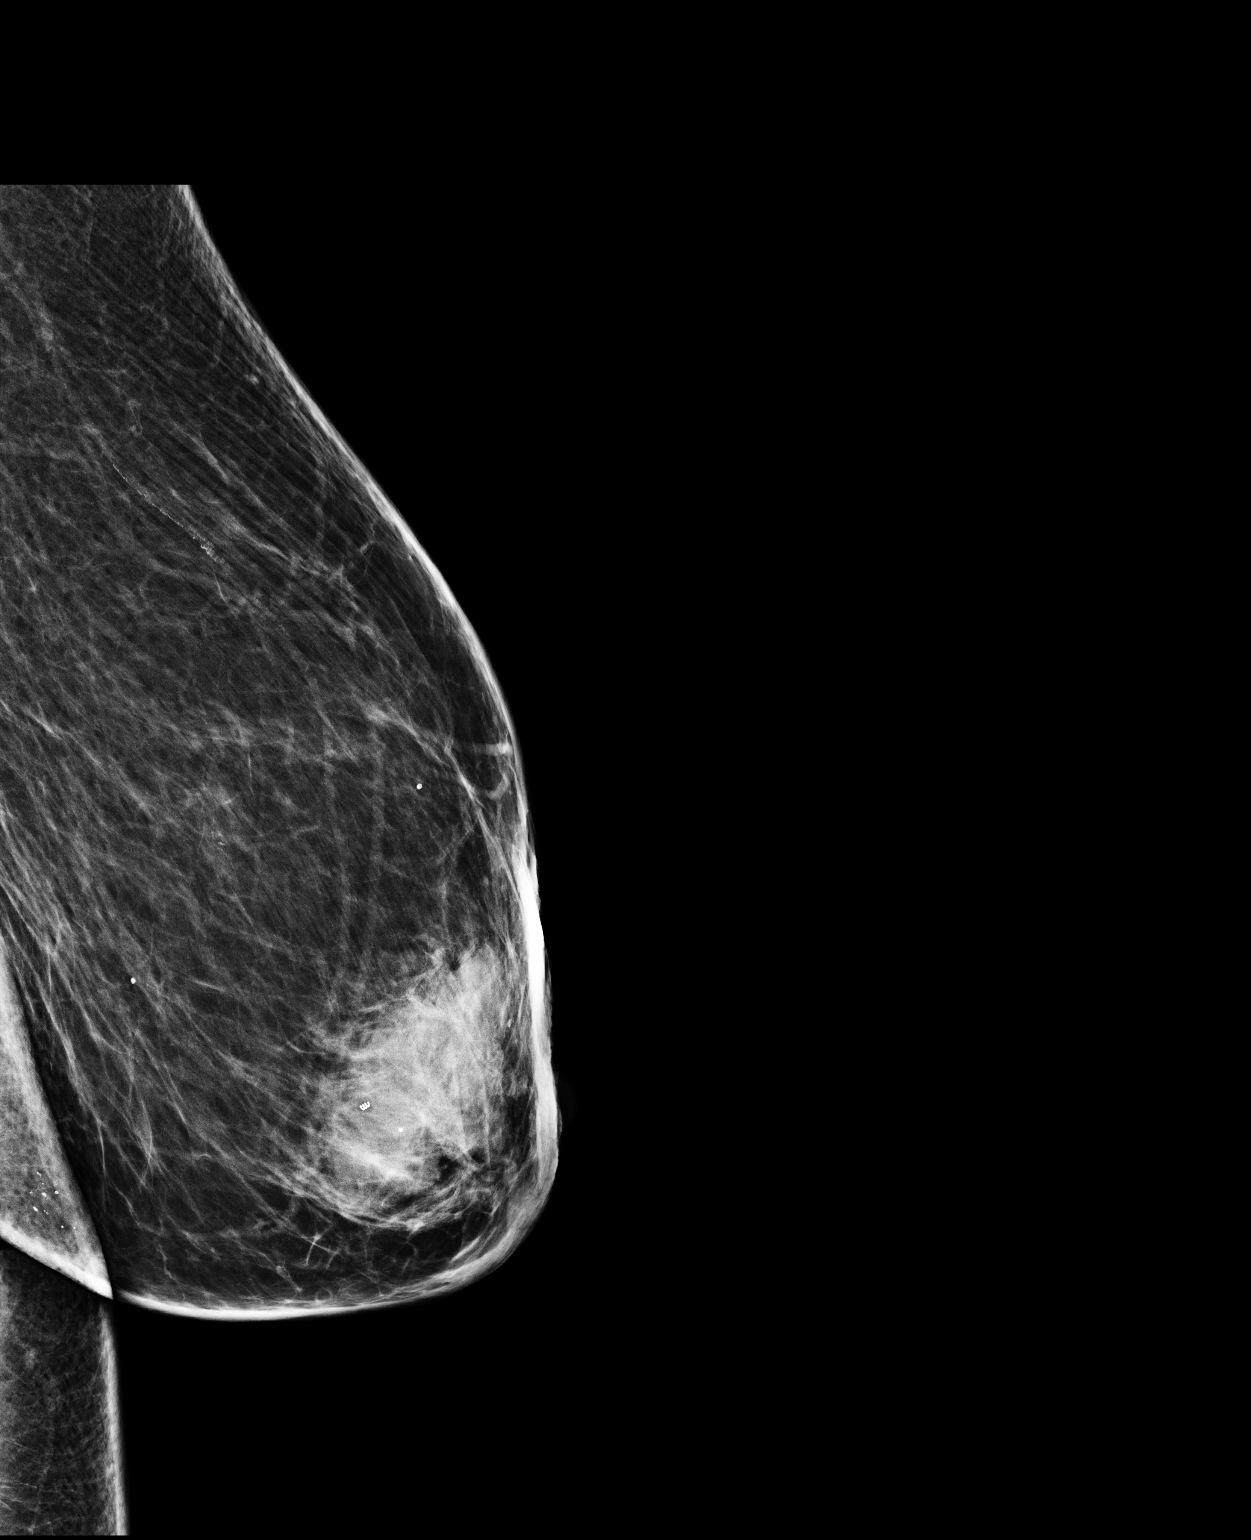

[L CC]
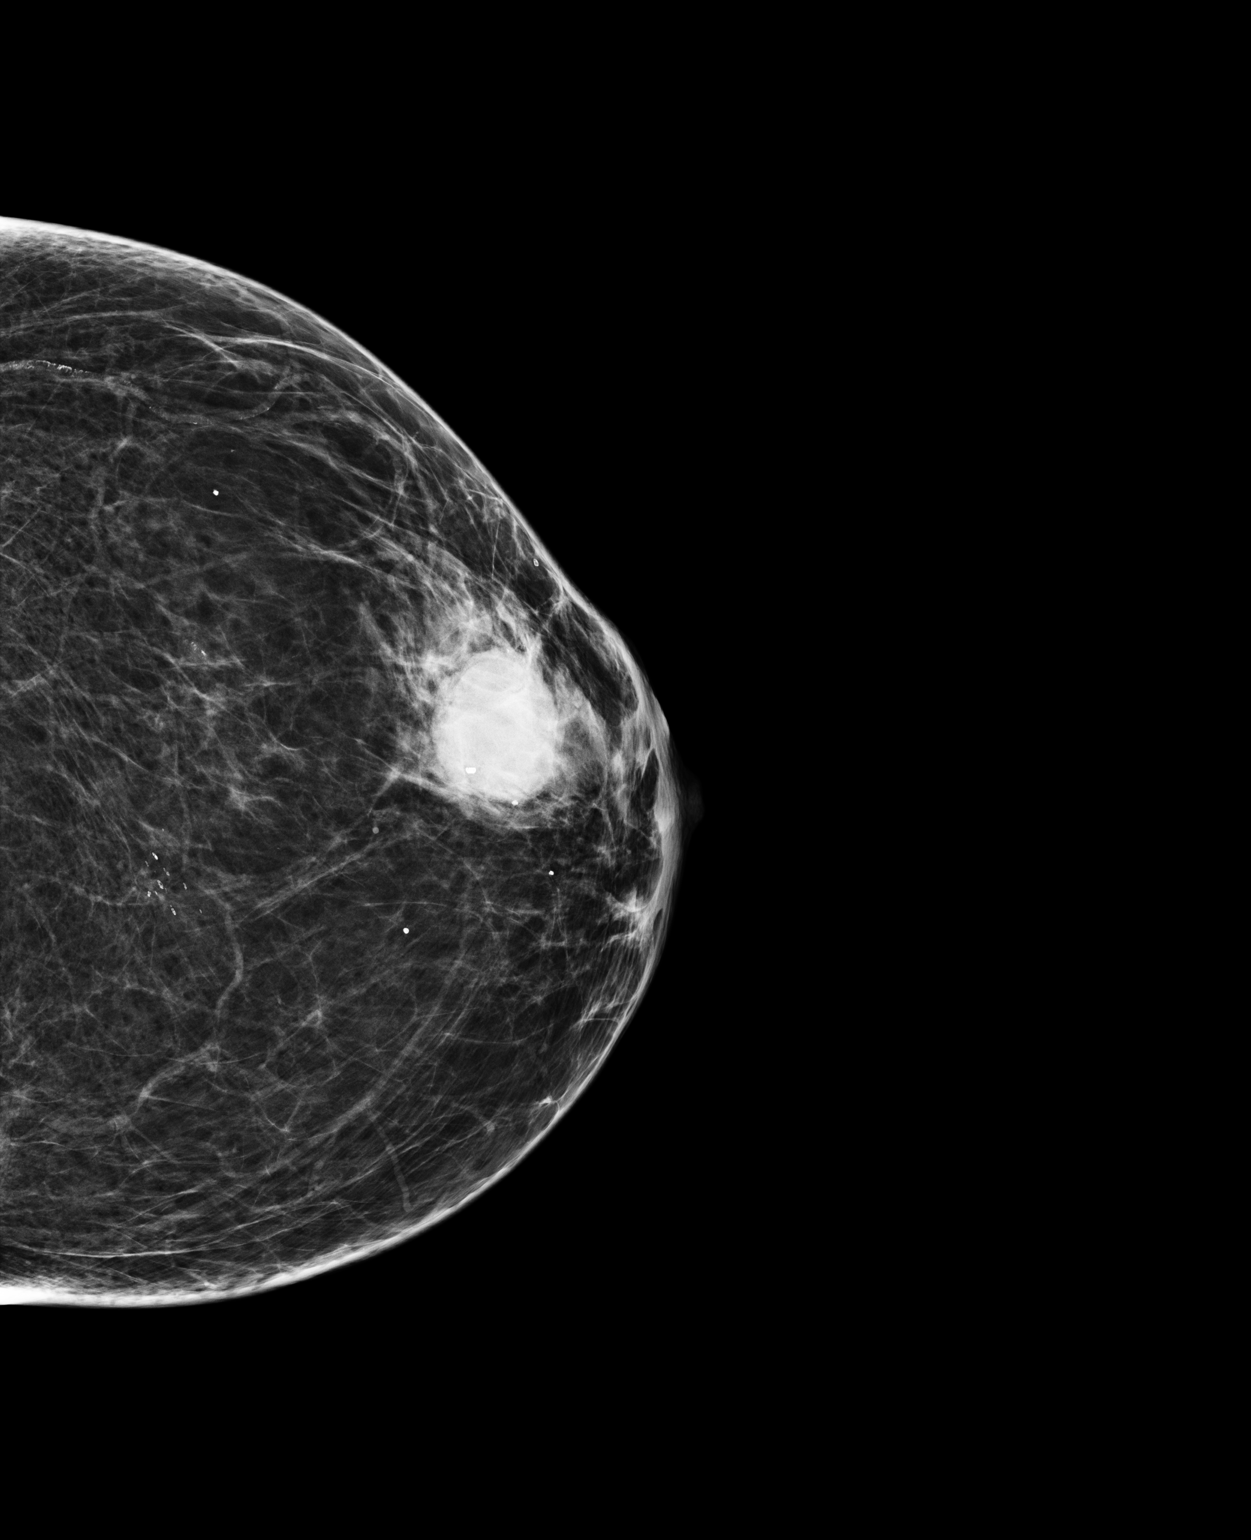

[2 of 2 positions shown; findings below may reference images not displayed]

PROCEDURE:

Informed consent was obtained prior to the procedure. Patient questions were answered.

A stereotactic guided biopsy was performed for the 2.3 cm cluster of calcifications in the left breast 3 o'clock anterior depth.  The skin was prepped in the usual manner.  Local anesthetic was administered to the access site.  A skin nick was made in the breast.  The abnormality was approached using an upright digital mammography unit.  A 9 gauge biopsy needle was placed adjacent to the abnormality under computer guidance and confirmatory stereotactic mammography images were obtained to document needle placement.  Once the needle was documented to be in the correct location, multiple specimens were obtained using a Hologic Brevera vacuum needle.  A titanium clip was inserted into the biopsy cavity.  A skin adhesive was applied to the access site.  Post procedure digital mammographic imaging demonstrates the clip at the targeted area.  The specimens were sent to the laboratory for pathological analysis.  

A female technologist was present throughout the procedure.
IMPRESSION: BENIGN

Stereotactic guided biopsy of the 2.3 cm cluster of calcifications in the left breast 3 o'clock anterior depth was successful with no apparent post procedure complications.  Pathology indicates benign fibroadenomatoid change with calcification and no atypia. Pathology results are concordant with imaging findings.  

Follow-up with annual screening mammogram.

The biopsy results were discussed with the patient.
